# Patient Record
Sex: Female | Born: 1935 | Race: White | Hispanic: No | Marital: Single | Smoking: Never smoker
Health system: Southern US, Community
[De-identification: ages and names within clinical notes are randomized; demographics above are authoritative.]

## PROBLEM LIST (undated history)

## (undated) DIAGNOSIS — R296 Repeated falls: Secondary | ICD-10-CM

## (undated) DIAGNOSIS — M1990 Unspecified osteoarthritis, unspecified site: Secondary | ICD-10-CM

## (undated) DIAGNOSIS — I6529 Occlusion and stenosis of unspecified carotid artery: Secondary | ICD-10-CM

## (undated) DIAGNOSIS — I10 Essential (primary) hypertension: Secondary | ICD-10-CM

## (undated) DIAGNOSIS — E039 Hypothyroidism, unspecified: Secondary | ICD-10-CM

## (undated) DIAGNOSIS — H3530 Unspecified macular degeneration: Secondary | ICD-10-CM

## (undated) DIAGNOSIS — N390 Urinary tract infection, site not specified: Secondary | ICD-10-CM

## (undated) DIAGNOSIS — N179 Acute kidney failure, unspecified: Secondary | ICD-10-CM

## (undated) HISTORY — DX: Unspecified macular degeneration: H35.30

## (undated) HISTORY — DX: Hypothyroidism, unspecified: E03.9

## (undated) HISTORY — DX: Unspecified osteoarthritis, unspecified site: M19.90

## (undated) HISTORY — PX: APPENDECTOMY: SHX54

## (undated) HISTORY — PX: CATARACT EXTRACTION: SUR2

## (undated) HISTORY — PX: LUMBAR LAMINECTOMY: SHX95

## (undated) HISTORY — DX: Essential (primary) hypertension: I10

## (undated) HISTORY — DX: Occlusion and stenosis of unspecified carotid artery: I65.29

---

## 2002-07-05 ENCOUNTER — Encounter: Admission: RE | Admit: 2002-07-05 | Discharge: 2002-07-05 | Payer: Self-pay | Admitting: Internal Medicine

## 2002-07-05 ENCOUNTER — Encounter: Payer: Self-pay | Admitting: Internal Medicine

## 2007-09-13 ENCOUNTER — Ambulatory Visit: Payer: Self-pay | Admitting: Vascular Surgery

## 2007-09-27 ENCOUNTER — Ambulatory Visit: Payer: Self-pay | Admitting: Surgery

## 2008-04-03 ENCOUNTER — Ambulatory Visit: Payer: Self-pay | Admitting: Surgery

## 2008-04-14 ENCOUNTER — Encounter: Payer: Self-pay | Admitting: Surgery

## 2008-04-14 ENCOUNTER — Inpatient Hospital Stay (HOSPITAL_COMMUNITY): Admission: RE | Admit: 2008-04-14 | Discharge: 2008-04-16 | Payer: Self-pay | Admitting: Surgery

## 2008-04-14 ENCOUNTER — Ambulatory Visit: Payer: Self-pay | Admitting: Surgery

## 2008-04-14 HISTORY — PX: CAROTID ENDARTERECTOMY: SUR193

## 2008-05-01 ENCOUNTER — Ambulatory Visit: Payer: Self-pay | Admitting: Surgery

## 2008-11-06 ENCOUNTER — Ambulatory Visit: Payer: Self-pay | Admitting: Surgery

## 2009-05-21 ENCOUNTER — Ambulatory Visit: Payer: Self-pay | Admitting: Surgery

## 2009-11-26 ENCOUNTER — Ambulatory Visit: Payer: Self-pay | Admitting: Surgery

## 2010-06-11 ENCOUNTER — Other Ambulatory Visit (INDEPENDENT_AMBULATORY_CARE_PROVIDER_SITE_OTHER): Payer: Medicare Other

## 2010-06-11 DIAGNOSIS — I6529 Occlusion and stenosis of unspecified carotid artery: Secondary | ICD-10-CM

## 2010-06-11 DIAGNOSIS — Z48812 Encounter for surgical aftercare following surgery on the circulatory system: Secondary | ICD-10-CM

## 2010-06-11 LAB — TYPE AND SCREEN
ABO/RH(D): O POS
Antibody Screen: NEGATIVE

## 2010-06-11 LAB — COMPREHENSIVE METABOLIC PANEL
ALT: <8 U/L (ref 0–35)
AST: 16 U/L (ref 0–37)
CO2: 31 mEq/L (ref 19–32)
Chloride: 100 mEq/L (ref 96–112)
Creatinine, Ser: 1.01 mg/dL (ref 0.4–1.2)
GFR calc Af Amer: >60 mL/min (ref >60)
GFR calc non Af Amer: 53 mL/min — ABNORMAL LOW (ref >60)
Sodium: 141 mEq/L (ref 135–145)
Total Bilirubin: 0.6 mg/dL (ref 0.3–1.2)

## 2010-06-11 LAB — BASIC METABOLIC PANEL
BUN: 12 mg/dL (ref 6–23)
CO2: 28 mEq/L (ref 19–32)
Calcium: 8.8 mg/dL (ref 8.4–10.5)
Chloride: 108 mEq/L (ref 96–112)
Creatinine, Ser: 0.77 mg/dL (ref 0.4–1.2)
GFR calc Af Amer: >60 mL/min (ref >60)
Glucose, Bld: 94 mg/dL (ref 70–99)

## 2010-06-11 LAB — CBC
MCHC: 34.6 g/dL (ref 30.0–36.0)
MCV: 92.4 fL (ref 78.0–100.0)
MCV: 92.6 fL (ref 78.0–100.0)
Platelets: 148 10*3/uL — ABNORMAL LOW (ref 150–400)
RBC: 4.36 MIL/uL (ref 3.87–5.11)
RBC: 3.47 MIL/uL — ABNORMAL LOW (ref 3.87–5.11)
RDW: 13 % (ref 11.5–15.5)
WBC: 8.6 10*3/uL (ref 4.0–10.5)

## 2010-06-11 LAB — URINALYSIS, ROUTINE W REFLEX MICROSCOPIC
Glucose, UA: NEGATIVE mg/dL
Hgb urine dipstick: NEGATIVE
Protein, ur: NEGATIVE mg/dL
pH: 5.5 (ref 5.0–8.0)

## 2010-06-11 LAB — URINE MICROSCOPIC-ADD ON

## 2010-06-18 NOTE — Procedures (Unsigned)
CAROTID DUPLEX EXAM  INDICATION:  Followup carotid endarterectomy.  HISTORY: Diabetes:  No. Cardiac:  No. Hypertension:  Yes. Smoking:  No. Previous Surgery:  Right carotid endarterectomy on 04/14/2008. CV History:  Asymptomatic. Amaurosis Fugax No, Paresthesias No, Hemiparesis No                                      RIGHT             LEFT Brachial systolic pressure:         150               142 Brachial Doppler waveforms:         WNL               WNL Vertebral direction of flow:        Antegrade         Antegrade DUPLEX VELOCITIES (cm/sec) CCA peak systolic                   87                69 ECA peak systolic                   104               104 ICA peak systolic                   128               229 ICA end diastolic                   27                55 PLAQUE MORPHOLOGY:                  Heterogeneous     Calcified PLAQUE AMOUNT:                      Mild              Moderate to severe PLAQUE LOCATION:                    CCA               CCA, ICA  IMPRESSION: 1. Right internal carotid artery appears widely patent with history of     endarterectomy, elevated velocities present which may be consistent     with recently endarterectomized vessel. 2. Left internal carotid artery stenosis in the 40% to 59% range (high     end of range), however, may be underestimated due to calcified     plaque making Doppler interrogation difficult. 3. The left-sided disease is downgraded since previous study on     11/26/2009. 4. The right side is unchanged since study dated 11/26/2009.  ___________________________________________ V. Charlena Cross, MD  SH/MEDQ  D:  06/11/2010  T:  06/11/2010  Job:  [id number]

## 2010-07-09 NOTE — Op Note (Signed)
[name], [name]                  ACCOUNT NO.:  [id number]   MEDICAL RECORD NO.:  [id number]          PATIENT TYPE:  INP   LOCATION:  3305                         FACILITY:  [facility]   PHYSICIAN:  Juleen China IV, MDDATE OF BIRTH:  11-03-1924   DATE OF PROCEDURE:  04/14/2008  DATE OF DISCHARGE:                               OPERATIVE REPORT   PREOPERATIVE DIAGNOSIS:  Asymptomatic right carotid stenosis.   POSTOPERATIVE DIAGNOSIS:  Asymptomatic right carotid stenosis.   PROCEDURE PERFORMED:  Right carotid endarterectomy with bovine  pericardial patch angioplasty.   SURGEON:  1. Charlena Cross, MD   ASSISTANT:  Wilmon Arms, PA   TYPE OF ANESTHESIA:  General.   BLOOD LOSS:  50 mL.   FINDINGS:  Stenosis 90%.  No thrombus.   DRAINS:  None.   CULTURES:  None.   SPECIMENS:  Plaque and a lymph node.   INDICATIONS:  This is an 77 year old female, found to have progression  of her bilateral carotid stenosis by ultrasound.  The right side now  measures 80-99% with the left being 60-79%.  The patient is highly  functional, and after a prolonged discussion regarding treatment  options, we have elected to proceed with carotid endarterectomy.  Risks  and benefits were discussed.  Informed consent was signed.   PROCEDURE:  The patient was identified in the holding area and taken to  room #6.  She was placed supine on the table.  General endotracheal  anesthesia was administered.  The patient was prepped and draped in the  standard sterile fashion.  A time-out was called, and antibiotics were  given.  Incision was made along the anterior border of the  sternocleidomastoid.  Cautery was used to divide the platysma muscle.  Weitlaner retractors were used for exposure.  The internal jugular vein  was skeletonized along its anteromedial border.  The common facial vein  was identified and divided between 2-0 silk ties and metal clips.  The  carotid sheath was then opened, and  the common carotid artery was  exposed throughout its length and the incision, and it was encircled  with an umbilical tape.  Next, I exposed the external carotid artery,  and this was also encircled with a vessel loop.  Dissection was then  carried up onto the internal carotid artery.  The hypoglossal nerve was  visualized and protected.  Once adequate mobilization of the internal  carotid artery was achieved, an umbilical tape was passed.  At this  point in time, the patient was given systemic heparinization.  After the  heparin had circulated, the internal carotid artery was occluded  followed by the external and common carotid arteries.  An #11 blade was  used to make an arteriotomy, which was extended along the anterolateral  border of the artery.  The patient had extensive calcified plaque about  90% stenosis.  There was no thrombus.  Once the artery was opened onto  the normal part of the internal carotid artery, a 10-French shunt was  placed.  Next, endarterectomy was performed using an elevator.  Eversion  endarterectomy was performed in the external carotid artery.  A good  distal end point was obtained on the internal carotid artery.  Two 7-0  tacking sutures were placed.  Next, the endarterectomy site was  irrigated with heparinized saline.  All potential embolic debris was  removed.  Bovine pericardial patch was selected and brought onto the  field.  Patch angioplasty was performed using a running 6-0 Prolene.  Prior to completion of anastomosis, the shunt was removed.  The  internal, external, and common carotid arteries were all flushed  appropriately.  The anastomosis was then secured.  Clamps were released  first on the external carotid artery followed by the common carotid  artery, and after approximately 15 seconds, the clamp on the internal  carotid artery was released.  Several repair stitches were required for  hemostasis.  Next, the artery was evaluated with Doppler.   Good signals  were obtained of the common internal and external carotid arteries.  Next, the patient's heparin was reversed with 50 mg of protamine.  The  carotid sheath was reapproximated with running 3-0 Vicryl.  The platysma  muscle was closed with running 3-0 Vicryl, and skin was closed with 4-0  Vicryl.  The patient tolerated the procedure well.  There were no  complications.  She was taken to the recovery room in stable condition.  She was moving all 4 extremities to command.           ______________________________  V. Charlena Cross, MD  Electronically Signed     VWB/MEDQ  D:  04/14/2008  T:  04/14/2008  Job:  [id number]

## 2010-07-09 NOTE — Procedures (Signed)
CAROTID DUPLEX EXAM   INDICATION:  Follow-up evaluation of known carotid artery disease.   HISTORY:  Diabetes:  No.  Cardiac:  No.  Hypertension:  Yes.  Smoking:  No.  Previous Surgery:  No.  CV History:  Previous duplex on 09/13/2007 revealed 60-79% ICA stenosis  bilaterally.  Amaurosis Fugax  No, Paresthesias No, Hemiparesis No.                                       RIGHT             LEFT  Brachial systolic pressure:         138               136  Brachial Doppler waveforms:         Triphasic         Triphasic  Vertebral direction of flow:        Antegrade         Antegrade  DUPLEX VELOCITIES (cm/sec)  CCA peak systolic                   61                77  ECA peak systolic                   117               120  ICA peak systolic                   471               361  ICA end diastolic                   149               87  PLAQUE MORPHOLOGY:                  Calcified         Calcified  PLAQUE AMOUNT:                      Severe            Severe  PLAQUE LOCATION:                    Proximal ICA      Proximal ICA   IMPRESSION:  1. 80-99% right internal carotid artery stenosis.  2. 60-79% left internal carotid artery stenosis.      ___________________________________________  V. Charlena Cross, MD   MC/MEDQ  D:  04/03/2008  T:  04/03/2008  Job:  [id number]

## 2010-07-09 NOTE — Assessment & Plan Note (Signed)
OFFICE VISIT   [name], [name]  DOB:  Aug 02, 1924                                       09/27/2007  SNKNL#:[id number]   REASON FOR VISIT:  Evaluate carotid disease.   HISTORY:  This is an 77 year old female who began having left-sided neck  pain, which prompted an evaluation of her carotid arteries, and she was  found to have a bruit.  She was sent to our office for further  management as well as a duplex ultrasound.  The patient had a  significant family history for heart disease, as well as stroke.  The  patient, on questioning, denies having any specific symptoms related to  her carotid disease.  She does not have amaurosis fugax.  She does not  have any numbness or weakness in either extremity.  She has never been  slurring her words.  She does not have headaches.  She has had one  episode of dizziness, however.   REVIEW OF SYSTEMS:  GENERAL:  Negative for fevers or chills, weight  loss, weight gain.  CARDIAC:  Negative.  PULMONARY:  Negative.  GASTROINTESTINAL:  Negative.  GENITOURINARY:  Negative.  VASCULAR:  Negative.  NEURO:  Positive for the dizziness.  ORTHO:  Positive for arthritis.  PSYCH:  Negative.  ENT:  Negative.  HEMATOLOGIC:  Negative.   PAST MEDICAL HISTORY:  Hypertension.  Macular degeneration in her left  eye.  Hypothyroidism.   FAMILY HISTORY:  Positive for cardiovascular disease, including stroke  and heart attack.   SOCIAL HISTORY:  She is single.  She does not smoke.  She does not  drink.   PAST SURGICAL HISTORY:  Back surgery, appendectomy, and bilateral  cataract.   MEDICATIONS:  Include Synthroid, hydrochlorothiazide, atenolol.   ALLERGIES:  None.   PHYSICAL EXAMINATION:  Blood pressure 176/69, pulse 64.  General:  She  is well-appearing, in no acute distress.  HEENT:  Normocephalic,  atraumatic.  Pupils are equal.  Sclerae anicteric.  Neck:  Left carotid  bruit.  Cardiovascular is regular rate and rhythm.  No  murmurs, rubs, or  gallops.  Pulmonary:  Lungs are clear bilaterally.  Abdomen:  Soft and  nontender.  No pulsatile mass.  Extremities:  Warm and well perfused.  Neuro:  Cranial nerves 2-12 grossly intact.  Psych:  She is alert and  oriented x3.  Skin is without rash.   DIAGNOSTIC STUDIES:  The patient had a duplex of her carotid arteries,  which revealed 60-79% stenosis bilaterally.   ASSESSMENT AND PLAN:  Asymptomatic bilateral moderate carotid stenosis.   PLAN:  The patient should continue with risk factor modification, which  will include medications for her hypertension.  I would also consider  adding an ACE inhibitor.  The patient does need to be on an aspirin.  With regard to her carotid disease, I would not recommend intervention  at this time, as she is asymptomatic.  I am going to recommend that she  follow up in 6 months with a repeat ultrasound.  She was instructed of  the signs and symptoms of TIA/stroke, and encouraged that, if these  occur, to call the emergency department right away.  Again, I will see  her back in 6 months.   Jorge Ny, MD  Electronically Signed   VWB/MEDQ  D:  09/27/2007  T:  09/28/2007  Job:  [id number]   cc:   Theressa Millard, M.D.

## 2010-07-09 NOTE — Assessment & Plan Note (Signed)
OFFICE VISIT   Tamara, MCGINNESS  DOB:  [birth year]                                       05/01/2008  [id number]#:[id number]   REASON FOR VISIT:  Follow up.   HISTORY:  This is an 77 year old female who is now status post right  carotid endarterectomy for asymptomatic high-grade disease.  Her  operation was performed on February 19th.  She comes back complaining of  mild headaches which are relieved with Tylenol.  She has no neurologic  deficits.  She has some stiffness in her neck.  On physical examination  her blood pressure is 110/71, pulse is 64.  Her incision is well-healed.  Dermabond was removed today.  She is neurologically intact.   ASSESSMENT/PLAN:  Status post right carotid endarterectomy.   Plan:  The patient will follow up in 6 months with a repeat carotid  ultrasound.  Preoperatively, she had 60% to 79% stenosis on the left.  I  told her it is okay for her to take a Tylenol for her headaches;  however, if Tylenol does not relieve her pain she needs to contact me.  Again, I will see her back in 6 months.   Jorge Ny, MD  Electronically Signed   VWB/MEDQ  D:  05/01/2008  T:  05/02/2008  Job:  [id number]   cc:   Theressa Millard, M.D.

## 2010-07-09 NOTE — Procedures (Signed)
CAROTID DUPLEX EXAM   INDICATION:  Carotid artery disease.   HISTORY:  Diabetes:  No.  Cardiac:  No.  Hypertension:  Yes.  Smoking:  No.  Previous Surgery:  Right carotid endarterectomy, 04/14/2008.  CV History:  Asymptomatic.  Amaurosis Fugax Yes No, Paresthesias Yes No, Hemiparesis Yes No                                       RIGHT             LEFT  Brachial systolic pressure:         170               168  Brachial Doppler waveforms:         Within normal limits                Within normal limits  Vertebral direction of flow:        Antegrade         Antegrade  DUPLEX VELOCITIES (cm/sec)  CCA peak systolic                   M=70, D=97        67  ECA peak systolic                   78                107  ICA peak systolic                   132               300  ICA end diastolic                   31                62  PLAQUE MORPHOLOGY:                  Intimal wall thickening             Calcific  PLAQUE AMOUNT:                      Minimal           Large  PLAQUE LOCATION:                    CCA               ICA   IMPRESSION:  1. Right internal carotid artery velocity suggests 1%-39% stenosis,      however, no plaque visualized.  2. Left internal carotid artery velocity suggests a 60%-79% stenosis      with calcific plaque and acoustic shadowing noted in the      bifurcation.   ___________________________________________  V. Charlena Cross, MD   EM/MEDQ  D:  11/26/2009  T:  11/26/2009  Job:  [id number]

## 2010-07-09 NOTE — Discharge Summary (Signed)
[name], Tamara                  ACCOUNT NO.:  [id number]   MEDICAL RECORD NO.:  [id number]          PATIENT TYPE:  INP   LOCATION:  2025                         FACILITY:  [facility]   PHYSICIAN:  Juleen China IV, MDDATE OF BIRTH:  02-Apr-1924   DATE OF ADMISSION:  04/14/2008  DATE OF DISCHARGE:  04/16/2008                               DISCHARGE SUMMARY   FINAL DISCHARGE DIAGNOSES:  1. Asymptomatic right carotid stenosis.  2. Hypothyroidism.  3. Hypertension.  4. Dyslipidemia.   PROCEDURE PERFORMED:  Right carotid endarterectomy, April 14, 2008.   COMPLICATIONS:  None.   CONDITION ON DISCHARGE:  Stable, improving.   DISCHARGE MEDICATIONS:  She is instructed to resume her:  1. Lisinopril 10 mg p.o. daily.  2. Simvastatin 20 mg p.o. daily.  3. Synthroid 75 mcg p.o. daily.  4. Hydrochlorothiazide 25 mg p.o. daily.  5. Atenolol 25 mg p.o. daily.  6. Caltrate Plus p.o. daily.  7. Ocuvite daily.  8. Aspirin 81 mg p.o. daily.  9. She is given a prescription for Percocet 5/325 one p.o. q.4 h.      p.r.n. pain, total #20 were given.   DISPOSITION:  She is being discharged home in stable condition after  receiving careful instructions regarding the care of her wounds and  activity level.  She will be given an appointment with Dr. Myra Gianotti in 2  weeks for continuing in followup.  The office will arrange a visit.  It  is also recommended that she follow up with personal GI Medicine  physician for her esophageal stricture.   BRIEF IDENTIFYING STATEMENT:  For complete details, please refer the  typed history and physical.  Briefly, this very pleasant 77 year old  woman was referred to Dr. Myra Gianotti for asymptomatic carotid occlusive  disease.  He found her to be a suitable candidate for carotid  endarterectomy for stroke prevention.  She was informed of the risks and  benefits of the procedure and after careful consideration elected to  proceed with surgery.   HOSPITAL COURSE:   Preoperative workup was completed as an outpatient.  She was brought in through Same-Day Surgery and underwent the  aforementioned carotid endarterectomy.  For complete details, please  refer the typed operative report.  Procedure was without complications.  She was returned to the Postanesthesia Care Unit extubated.  Following  stabilization, she was transferred to a bed in a Surgical Step-Down  Unit.  She was observed overnight.  The following morning, she was  neurologically nonfocal.  Her tongue was midline and her smile was  symmetrical.  She did have some dysphagia and nausea and vomiting.  Due  to this, we elected to transfer her to a bed on a Surgical Convalescent  Floor.  She was observed for another 24 hours.  Her nausea and vomiting  resolved.  She was found to be stable and was discharged home in stable  condition.      Wilmon Arms, PA      V. Charlena Cross, MD  Electronically Signed    KEL/MEDQ  D:  04/16/2008  T:  04/16/2008  Job:  [id number]

## 2010-07-09 NOTE — Procedures (Signed)
CAROTID DUPLEX EXAM   INDICATION:  Left carotid bruit.   HISTORY:  Diabetes:  No.  Cardiac:  No.  Hypertension:  Yes.  Smoking:  No.  Previous Surgery:  No.  CV History:  Patient complains of episodes of bilateral neck pain and a  dizzy spell last week.  Patient was sent over with the left carotid  bruit.  Amaurosis Fugax No, Paresthesias No, Hemiparesis No.                                       RIGHT             LEFT  Brachial systolic pressure:         178               178  Brachial Doppler waveforms:         Triphasic         Triphasic  Vertebral direction of flow:        Antegrade         Antegrade  DUPLEX VELOCITIES (cm/sec)  CCA peak systolic                   48                60  ECA peak systolic                   65                83  ICA peak systolic                   402               338  ICA end diastolic                   87                69  PLAQUE MORPHOLOGY:                  Mixed             Calcified  PLAQUE AMOUNT:                      Moderate-to-severe                  Moderate-to-severe  PLAQUE LOCATION:                    Proximal ICA      Proximal ICA   IMPRESSION:  1. 60-79% right internal carotid artery stenosis (high end of range).  2. 60-79% left internal carotid artery stenosis (high end of range);      however, acoustic shadowing may mask higher velocities.   A preliminary report was called to Dr. Lovett Calender office.   ___________________________________________  Janetta Hora. Fields, MD   MC/MEDQ  D:  09/13/2007  T:  09/13/2007  Job:  [id number]

## 2010-07-09 NOTE — Procedures (Signed)
CAROTID DUPLEX EXAM   INDICATION:  Follow up known carotid artery disease with right carotid  endarterectomy.   HISTORY:  Diabetes:  No.  Cardiac:  No.  Hypertension:  Yes.  Smoking:  No.  Previous Surgery:  Right carotid endarterectomy on 04/14/2008.  CV History:  No.  Amaurosis Fugax No, Paresthesias No, Hemiparesis No                                       RIGHT             LEFT  Brachial systolic pressure:         150               158  Brachial Doppler waveforms:         biphasic          biphasic  Vertebral direction of flow:        antegrade         antegrade  DUPLEX VELOCITIES (cm/sec)  CCA peak systolic                   75                69  ECA peak systolic                   114               112  ICA peak systolic                   133 (mid)         335  ICA end diastolic                   33                75  PLAQUE MORPHOLOGY:                  none              calcified  PLAQUE AMOUNT:                      none              moderate to severe  PLAQUE LOCATION:                    none              ICA and ECA    IMPRESSION:  1. A 60-79% stenosis noted in the left internal carotid artery.  2. A 40-59% stenosis noted in the right mid-internal carotid artery.  3. Antegrade bilateral vertebral arteries.   ___________________________________________  V. Charlena Cross, MD   MG/MEDQ  D:  11/06/2008  T:  11/07/2008  Job:  [id number]

## 2010-07-09 NOTE — Assessment & Plan Note (Signed)
OFFICE VISIT   ALEXEI, EY  DOB:  1925-02-04                                       04/03/2008  UEAVW#:[id number]   REASON FOR VISIT:  Follow up carotid disease.   HISTORY:  This is an 77 year old female who I initially saw in August  2009 for evaluation of her carotids.  She was found to have a bruit.  On  duplex at that time, she had bilateral 60% to 79% disease.  She was  asymptomatic.  She comes back in today for further follow-up.  She  continues to be without symptoms.  Specifically, she denies numbness or  weakness in either extremity.  She denies amaurosis fugax, no slurring  of her speech.  She has not had any episodes of chest pain or shortness  of breath.  She has been afebrile, no changes in her weight.  She does  complain of some arthritic pain in her left hip and left knee.   PAST MEDICAL HISTORY:  Significant hypertension, macular degeneration in  her left eye, hypothyroidism, esophageal stricture.   FAMILY HISTORY:  Positive for cardiovascular disease, including stroke  and heart attack in her mother's side of the family.   SOCIAL HISTORY:  She is single.  No smoking, no alcohol.   PAST SURGICAL HISTORY:  Back surgery, appendectomy, bilateral cataract  surgery.   MEDICATIONS:  Synthroid, hydrochlorothiazide, atenolol.   ALLERGIES:  None.   PHYSICAL EXAMINATION:  Blood pressure is 106/65, pulse 55.  General:  She is well-appearing, in no distress.  HEENT:  Normocephalic,  atraumatic.  Pupils equal.  Sclerae anicteric.  Neck:  Supple, no JVD,  there is a left carotid bruit.  Cardiovascular:  Regular rate and  rhythm.  No murmurs, rubs or gallops.  Pulmonary:  Lungs are clear  bilaterally.  Abdomen:  Soft, nontender.  No pulsatile mass.  Extremities:  Warm and well-perfused.  Neuro:  Cranial nerves II-XII are  grossly intact.  She is nonfocal.  She has no neurologic deficits.  Psych:  She is alert and x3.  Skin is without rash.   DIAGNOSTIC STUDIES:  Duplex ultrasound was repeated today.  This reveals  progression of her disease.  She now has 80% to 99% stenosis in the  right carotid artery.  Her bifurcation is found to be mid hyoid.  There  was normal internal carotid artery past the stenosis.  Her left carotid  remains 60% to 79% stenosis.   IMPRESSION:  Progressive right carotid stenosis.   PLAN:  I have had a long conversation with the patient regarding our  treatment options.  Despite her being 77 years old, she looks very  healthy and looks to have a long life expectancy.  With 80% to 99% right  stenosis and 60% to 79% left carotid stenosis, I feel it is reasonable  to proceed with carotid endarterectomy.  We discussed the pros and cons  of surgery versus medical management.  We have decided to proceed with  endarterectomy.  We talked about the risks and benefits of surgery  including the risk of nerve injury, the risk of stroke, the risk of  bleeding, the risk of infection.  The patient currently is not taking an  aspirin because she was told not to by her GI doctor who dilates her  esophagus periodically for a stricture.  I  would at least like her to be  temporarily on aspirin around the time of her operation.  She may not  need to be on an aspirin long-term; however, given her cardiovascular  disease I would recommend that she would be.  She is going to discuss  this with Dr. Earl Gala, but will most likely proceed with right carotid  endarterectomy on Friday April 14, 2008.   Jorge Ny, MD  Electronically Signed   VWB/MEDQ  D:  04/03/2008  T:  04/04/2008  Job:  [id number]   cc:   Theressa Millard, M.D.

## 2010-07-09 NOTE — Procedures (Signed)
CAROTID DUPLEX EXAM   INDICATION:  Follow up carotid artery disease.   HISTORY:  Diabetes:  No.  Cardiac:  No.  Hypertension:  Yes.  Smoking:  No.  Previous Surgery:  Right CEA, 04/14/2008.  CV History:  Asymptomatic.  Amaurosis Fugax No, Paresthesias No, Hemiparesis No.                                       RIGHT             LEFT  Brachial systolic pressure:         176               168  Brachial Doppler waveforms:         WNL               WNL  Vertebral direction of flow:        Antegrade         Antegrade  DUPLEX VELOCITIES (cm/sec)  CCA peak systolic                   76                82  ECA peak systolic                   96                120  ICA peak systolic                   138               291  ICA end diastolic                   37                74  PLAQUE MORPHOLOGY:                                    Calcific  PLAQUE AMOUNT:                      None              Moderate-to-severe  PLAQUE LOCATION:  ICA/ECA/bifurcation   IMPRESSION:  1. Right internal carotid artery mid velocities are suggestive of 40-      59% stenosis; however, no plaque visualized.  2. Left internal carotid artery shows evidence of 60-79% stenosis with      calcific plaque and acoustic shadowing noted in the proximal      internal carotid artery.  3. No significant changes from previous study.   ___________________________________________  V. Charlena Cross, MD   AS/MEDQ  D:  05/21/2009  T:  05/21/2009  Job:  [id number]

## 2010-12-04 ENCOUNTER — Other Ambulatory Visit: Payer: Self-pay | Admitting: Lab

## 2010-12-04 DIAGNOSIS — Z48812 Encounter for surgical aftercare following surgery on the circulatory system: Secondary | ICD-10-CM

## 2010-12-04 DIAGNOSIS — I6529 Occlusion and stenosis of unspecified carotid artery: Secondary | ICD-10-CM

## 2010-12-09 ENCOUNTER — Ambulatory Visit (INDEPENDENT_AMBULATORY_CARE_PROVIDER_SITE_OTHER): Payer: Medicare Other | Admitting: Vascular Surgery

## 2010-12-09 DIAGNOSIS — I6529 Occlusion and stenosis of unspecified carotid artery: Secondary | ICD-10-CM

## 2010-12-09 DIAGNOSIS — Z48812 Encounter for surgical aftercare following surgery on the circulatory system: Secondary | ICD-10-CM

## 2011-01-06 ENCOUNTER — Encounter: Payer: Self-pay | Admitting: Surgery

## 2011-01-06 NOTE — Procedures (Unsigned)
CAROTID DUPLEX EXAM  INDICATION:  Follow up carotid stenosis.  HISTORY: Diabetes:  No. Cardiac:  No. Hypertension:  Yes. Smoking:  No. Previous Surgery:  Right carotid endarterectomy on 04/14/2008. CV History:  Asymptomatic. Amaurosis Fugax No, Paresthesias No, hemiparesis No.                                      RIGHT             LEFT Brachial systolic pressure:         138               140 Brachial Doppler waveforms:         WNL               WNL Vertebral direction of flow:        Antegrade         Antegrade DUPLEX VELOCITIES (cm/sec) CCA peak systolic                   66                68 ECA peak systolic                   87                86 ICA peak systolic                   117               202 ICA end diastolic                   25                44 PLAQUE MORPHOLOGY:                  Calcified         Calcified PLAQUE AMOUNT:                      Mild              Moderate PLAQUE LOCATION:                    ICA               CCA/ICA  IMPRESSION: 1. Right internal carotid artery appears patent with history of     endarterectomy. 2. Left internal carotid artery stenosis in the 40% to 59% range (high     end of range). 3. Left internal carotid artery stenosis may be underestimated due to     acoustic shadowing and dense calcified plaque, making Doppler     interrogation difficult. 4. Bilateral vertebral arteries are patent and antegrade. 5. Essentially unchanged since previous study on 06/11/2010.     ___________________________________________ V. Charlena Cross, MD  SH/MEDQ  D:  12/09/2010  T:  12/09/2010  Job:  [id number]

## 2011-01-08 ENCOUNTER — Other Ambulatory Visit: Payer: Self-pay | Admitting: Vascular Surgery

## 2011-01-08 DIAGNOSIS — I6529 Occlusion and stenosis of unspecified carotid artery: Secondary | ICD-10-CM

## 2011-01-08 DIAGNOSIS — Z48812 Encounter for surgical aftercare following surgery on the circulatory system: Secondary | ICD-10-CM

## 2011-06-02 ENCOUNTER — Encounter: Payer: Self-pay | Admitting: Surgery

## 2011-06-06 ENCOUNTER — Encounter: Payer: Self-pay | Admitting: Neurosurgery

## 2011-06-09 ENCOUNTER — Ambulatory Visit (INDEPENDENT_AMBULATORY_CARE_PROVIDER_SITE_OTHER): Payer: Medicare Other | Admitting: Neurosurgery

## 2011-06-09 ENCOUNTER — Other Ambulatory Visit (INDEPENDENT_AMBULATORY_CARE_PROVIDER_SITE_OTHER): Payer: Medicare Other | Admitting: *Deleted

## 2011-06-09 ENCOUNTER — Encounter: Payer: Self-pay | Admitting: Neurosurgery

## 2011-06-09 VITALS — BP 139/66 | HR 65 | Resp 16 | Ht 62.0 in | Wt 145.9 lb

## 2011-06-09 DIAGNOSIS — Z48812 Encounter for surgical aftercare following surgery on the circulatory system: Secondary | ICD-10-CM

## 2011-06-09 DIAGNOSIS — I6529 Occlusion and stenosis of unspecified carotid artery: Secondary | ICD-10-CM | POA: Insufficient documentation

## 2011-06-09 NOTE — Progress Notes (Signed)
[facility] HISTORY AND PHYSICAL   CC: Annual carotid duplex Referring Physician: Brabham  History of Present Illness: 78 year-old female with the history of right CEA in February 2010. She has no signs or symptoms of CVA, TIA, amaurosis fugax, diplopia, dysphasia, or word finding difficulty. Patient reports having a history of MI as well as CVA on her mother side of the family, father's side is unknown. She has no new medical complaints, conditions, no new surgeries.  Past Medical History  Diagnosis Date  . Arthritis   . Hypertension   . Macular degeneration of left eye   . Carotid artery occlusion     Bruit found August 2009  . Thyroid disease     Hypothyroidism    ROS: [x]  Positive   [ ]  Denies    General: [ ]  Weight loss, [ ]  Fever, [ ]  chills Neurologic: [ ]  Dizziness, [ ]  Blackouts, [ ]  Seizure [ ]  Stroke, [ ]  "Mini stroke", [ ]  Slurred speech, [ ]  Temporary blindness; [ ]  weakness in arms or legs, [ ]  Hoarseness Cardiac: [ ]  Chest pain/pressure, [ ]  Shortness of breath at rest [ ]  Shortness of breath with exertion, [ ]  Atrial fibrillation or irregular heartbeat Vascular: [ ]  Pain in legs with walking, [ ]  Pain in legs at rest, [ ]  Pain in legs at night,  [ ]  Non-healing ulcer, [ ]  Blood clot in vein/DVT,   Pulmonary: [ ]  Home oxygen, [ ]  Productive cough, [ ]  Coughing up blood, [ ]  Asthma,  [ ]  Wheezing Musculoskeletal:  [ ]  Arthritis, [ ]  Low back pain, [ ]  Joint pain Hematologic: [ ]  Easy Bruising, [ ]  Anemia; [ ]  Hepatitis Gastrointestinal: [ ]  Blood in stool, [ ]  Gastroesophageal Reflux/heartburn, [ ]  Trouble swallowing Urinary: [ ]  chronic Kidney disease, [ ]  on HD - [ ]  MWF or [ ]  TTHS, [ ]  Burning with urination, [ ]  Difficulty urinating Skin: [ ]  Rashes, [ ]  Wounds Psychological: [ ]  Anxiety, [ ]  Depression   Social History History  Substance Use Topics  . Smoking status: Never Smoker   . Smokeless tobacco: Not on file  . Alcohol  Use: No    Family History Family History  Problem Relation Age of Onset  . Heart disease Mother   . Stroke Mother   . Heart attack Mother     Not on File  Current Outpatient Prescriptions  Medication Sig Dispense Refill  . aspirin 81 MG tablet Take 81 mg by mouth daily.      Marland Kitchen atenolol (TENORMIN) 25 MG tablet Take 25 mg by mouth daily.      . beta carotene w/minerals (OCUVITE) tablet Take 1 tablet by mouth daily.      . Calcium-Vitamin D (CALTRATE 600 PLUS-VIT D PO) Take by mouth.      . hydrochlorothiazide (HYDRODIURIL) 25 MG tablet Take 25 mg by mouth daily.      Marland Kitchen levothyroxine (SYNTHROID, LEVOTHROID) 75 MCG tablet Take 75 mcg by mouth daily.      Marland Kitchen lisinopril (PRINIVIL,ZESTRIL) 10 MG tablet Take 10 mg by mouth daily.      . Nutritional Supplements (EQUATE PO) Take by mouth 2 (two) times daily.      . simvastatin (ZOCOR) 20 MG tablet Take 20 mg by mouth every evening.      Marland Kitchen acetaminophen (TYLENOL) 100 MG/ML solution Take 10 mg/kg by mouth every 4 (four) hours as needed.  Physical Examination  Filed Vitals:   06/09/11 1035  BP: 139/66  Pulse: 65  Resp: 16    Body mass index is 26.69 kg/(m^2).  General:  WDWN in NAD Gait: Normal HEENT: WNL Eyes: Pupils equal Pulmonary: normal non-labored breathing , without Rales, rhonchi,  wheezing Cardiac: RRR, without  Murmurs, rubs or gallops; Abdomen: soft, NT, no masses Skin: no rashes, ulcers noted  Vascular Exam Pulses: 2+ radial pulses bilaterally Carotid bruits: Are not present bilaterally, normal pulse to auscultation bilaterally. Extremities without ischemic changes, no Gangrene , no cellulitis; no open wounds;  Musculoskeletal: no muscle wasting or atrophy   Neurologic: A&O X 3; Appropriate Affect ; SENSATION: normal; MOTOR FUNCTION:  moving all extremities equally. Speech is fluent/normal  Non-Invasive Vascular Imaging CAROTID DUPLEX 06/09/2011  Right ICA 0 - 19% stenosis Left ICA 40 - 59 %  stenosis Previous study from April 2012 shows little change, right end-diastolic was 27 left was 55.  ASSESSMENT/PLAN: Assessment as above, plan patient understands signs and symptoms of CVA, she knows to go to the emergency room or contact our office should these problems arise. Otherwise, she will followup here in one year for repeat carotid duplex and to be seen in my clinic. Her questions were encouraged and answered  Lauree Chandler ANP   Clinic MD: Myra Gianotti

## 2011-06-10 NOTE — Progress Notes (Signed)
Addended by: Sharee Pimple on: 06/10/2011 12:43 PM   Modules accepted: Orders

## 2011-06-17 ENCOUNTER — Other Ambulatory Visit: Payer: Self-pay | Admitting: *Deleted

## 2011-06-18 ENCOUNTER — Encounter: Payer: Self-pay | Admitting: Surgery

## 2011-06-18 NOTE — Procedures (Unsigned)
CAROTID DUPLEX EXAM  INDICATION:  Carotid disease  HISTORY: Diabetes:  No Cardiac:  No Hypertension:  Yes Smoking:  No Previous Surgery:  Right CEA 04/14/2008 CV History:  Currently asymptomatic Amaurosis Fugax No, Paresthesias No, Hemiparesis No                                      RIGHT             LEFT Brachial systolic pressure:         150               155 Brachial Doppler waveforms:         Normal            Normal Vertebral direction of flow:        Antegrade         Antegrade DUPLEX VELOCITIES (cm/sec) CCA peak systolic                   91                62 ECA peak systolic                   57                76 ICA peak systolic                   109               223 ICA end diastolic                   32                57 PLAQUE MORPHOLOGY:                                    Calcific PLAQUE AMOUNT:                      None              Moderate PLAQUE LOCATION:                                      ICA, CCA  IMPRESSION: 1. Patent right carotid endarterectomy site with no evidence of     restenosis. 2. Left internal carotid artery velocity suggests high end 40%-59%     stenosis. 3. Antegrade vertebral arteries bilaterally.  ___________________________________________ V. Charlena Cross, MD  EM/MEDQ  D:  06/10/2011  T:  06/10/2011  Job:  [id number]

## 2012-06-07 ENCOUNTER — Ambulatory Visit: Payer: Medicare Other | Admitting: Neurosurgery

## 2012-06-07 ENCOUNTER — Other Ambulatory Visit (INDEPENDENT_AMBULATORY_CARE_PROVIDER_SITE_OTHER): Payer: Medicare Other | Admitting: *Deleted

## 2012-06-07 DIAGNOSIS — I6529 Occlusion and stenosis of unspecified carotid artery: Secondary | ICD-10-CM

## 2012-06-07 DIAGNOSIS — Z48812 Encounter for surgical aftercare following surgery on the circulatory system: Secondary | ICD-10-CM

## 2012-06-15 ENCOUNTER — Other Ambulatory Visit: Payer: Self-pay | Admitting: *Deleted

## 2012-06-15 DIAGNOSIS — Z48812 Encounter for surgical aftercare following surgery on the circulatory system: Secondary | ICD-10-CM

## 2012-06-16 ENCOUNTER — Encounter: Payer: Self-pay | Admitting: Surgery

## 2013-03-31 ENCOUNTER — Other Ambulatory Visit: Payer: Self-pay | Admitting: Surgery

## 2013-03-31 DIAGNOSIS — Z48812 Encounter for surgical aftercare following surgery on the circulatory system: Secondary | ICD-10-CM

## 2013-03-31 DIAGNOSIS — I6529 Occlusion and stenosis of unspecified carotid artery: Secondary | ICD-10-CM

## 2013-06-10 ENCOUNTER — Encounter: Payer: Self-pay | Admitting: Surgery

## 2013-06-13 ENCOUNTER — Ambulatory Visit (INDEPENDENT_AMBULATORY_CARE_PROVIDER_SITE_OTHER): Payer: Medicare Other | Admitting: Surgery

## 2013-06-13 ENCOUNTER — Encounter: Payer: Self-pay | Admitting: Surgery

## 2013-06-13 ENCOUNTER — Ambulatory Visit (HOSPITAL_COMMUNITY)
Admission: RE | Admit: 2013-06-13 | Discharge: 2013-06-13 | Disposition: A | Discharge status: Home / Self Care | Payer: Medicare Other | Source: Ambulatory Visit | Attending: Surgery | Admitting: Surgery

## 2013-06-13 VITALS — BP 151/77 | HR 63 | Ht 62.0 in | Wt 143.2 lb

## 2013-06-13 DIAGNOSIS — I6529 Occlusion and stenosis of unspecified carotid artery: Secondary | ICD-10-CM

## 2013-06-13 DIAGNOSIS — Z48812 Encounter for surgical aftercare following surgery on the circulatory system: Secondary | ICD-10-CM

## 2013-06-13 NOTE — Addendum Note (Signed)
Addended by: Sharee PimpleMCCHESNEY, MARILYN K on: 06/13/2013 06:35 PM   Modules accepted: Orders

## 2013-06-13 NOTE — Progress Notes (Signed)
Patient name: [name] MRN: [id number] DOB: 01-01-1925 Sex: female     Chief Complaint  Patient presents with  . Re-evaluation    1 year f/u - carotid     HISTORY OF PRESENT ILLNESS: The patient is back today for followup.  She is status post right carotid endarterectomy on 04/14/2008.  This was done for asymptomatic stenosis.  Intraoperative findings included 90% stenosis.  She is here today without complaints other than her arthritis.  She denies any neurologic symptoms.  Specifically she denies numbness or weakness in either extremity.  She denies slurred speech she denies amaurosis fugax.  Past Medical History  Diagnosis Date  . Arthritis   . Hypertension   . Macular degeneration of left eye   . Carotid artery occlusion     Bruit found August 2009  . Thyroid disease     Hypothyroidism    Past Surgical History  Procedure Laterality Date  . Carotid endarterectomy  04/14/2008    Right  . Spine surgery    . Appendectomy    . Eye surgery      Cataract, bilateral    History   Social History  . Marital Status: Single    Spouse Name: N/A    Number of Children: N/A  . Years of Education: N/A   Occupational History  . Not on file.   Social History Main Topics  . Smoking status: Never Smoker   . Smokeless tobacco: Not on file  . Alcohol Use: No  . Drug Use: No  . Sexual Activity:    Other Topics Concern  . Not on file   Social History Narrative  . No narrative on file    Family History  Problem Relation Age of Onset  . Heart disease Mother   . Stroke Mother   . Heart attack Mother     Allergies as of 06/13/2013  . (No Known Allergies)    Current Outpatient Prescriptions on File Prior to Visit  Medication Sig Dispense Refill  . aspirin 81 MG tablet Take 81 mg by mouth daily.      Marland Kitchen. atenolol (TENORMIN) 25 MG tablet Take 25 mg by mouth daily.      . beta carotene w/minerals (OCUVITE) tablet Take 1 tablet by mouth daily.      . Calcium-Vitamin D  (CALTRATE 600 PLUS-VIT D PO) Take by mouth.      . hydrochlorothiazide (HYDRODIURIL) 25 MG tablet Take 25 mg by mouth daily.      Marland Kitchen. levothyroxine (SYNTHROID, LEVOTHROID) 75 MCG tablet Take 75 mcg by mouth daily.      Marland Kitchen. lisinopril (PRINIVIL,ZESTRIL) 10 MG tablet Take 10 mg by mouth daily.      . simvastatin (ZOCOR) 20 MG tablet Take 20 mg by mouth every evening.      . Nutritional Supplements (EQUATE PO) Take by mouth 2 (two) times daily.       No current facility-administered medications on file prior to visit.     REVIEW OF SYSTEMS: Cardiovascular: No chest pain, chest pressure, palpitations, orthopnea, or dyspnea on exertion. No claudication or rest pain,  No history of DVT or phlebitis. Pulmonary: No productive cough, asthma or wheezing. Neurologic: No weakness, paresthesias, aphasia, or amaurosis. No dizziness. Hematologic: No bleeding problems or clotting disorders. Musculoskeletal: Positive for arthritis Gastrointestinal: No blood in stool or hematemesis Genitourinary: No dysuria or hematuria. Psychiatric:: No history of major depression. Integumentary: No rashes or ulcers. Constitutional: No fever or chills.  PHYSICAL EXAMINATION:   Vital signs are BP 151/77  Pulse 63  Ht 5\' 2"  (1.575 m)  Wt 143 lb 3.2 oz (64.955 kg)  BMI 26.18 kg/m2  SpO2 100% General: The patient appears their stated age. HEENT:  No gross abnormalities Pulmonary:  Non labored breathing Musculoskeletal: There are no major deformities. Neurologic: No focal weakness or paresthesias are detected, Skin: There are no ulcer or rashes noted. Psychiatric: The patient has normal affect. Cardiovascular: There is a regular rate and rhythm with systolic ejection murmur.  Right carotid bruit   Diagnostic Studies Ultrasound was ordered and reviewed.  There are no significant findings from one-year prior.  She has 40-59% left internal carotid stenosis.  The right carotid endarterectomy site is widely  patent  Assessment: Status post right carotid endarterectomy for asymptomatic stenosis Plan: The patient's exam today is unremarkable.  Her ultrasound has been stable.  The endarterectomy site is widely patent and there is stable disease on the left and the 40-59% category.  I have recommended repeat ultrasound in one year.  Jorge NyV. Wells Marvelous Woolford IV, M.D. [facility] Office: [phone] Pager:  [phone]

## 2014-06-16 ENCOUNTER — Encounter: Payer: Self-pay | Admitting: Family

## 2014-06-19 ENCOUNTER — Encounter: Payer: Self-pay | Admitting: Family

## 2014-06-19 ENCOUNTER — Ambulatory Visit (INDEPENDENT_AMBULATORY_CARE_PROVIDER_SITE_OTHER): Payer: Medicare Other | Admitting: Family

## 2014-06-19 ENCOUNTER — Ambulatory Visit (HOSPITAL_COMMUNITY)
Admission: RE | Admit: 2014-06-19 | Discharge: 2014-06-19 | Disposition: A | Discharge status: Home / Self Care | Payer: Medicare Other | Source: Ambulatory Visit | Attending: Family | Admitting: Family

## 2014-06-19 VITALS — BP 140/60 | HR 57 | Resp 16 | Ht 61.0 in | Wt 136.0 lb

## 2014-06-19 DIAGNOSIS — Z9889 Other specified postprocedural states: Secondary | ICD-10-CM | POA: Diagnosis not present

## 2014-06-19 DIAGNOSIS — I6523 Occlusion and stenosis of bilateral carotid arteries: Secondary | ICD-10-CM

## 2014-06-19 DIAGNOSIS — Z48812 Encounter for surgical aftercare following surgery on the circulatory system: Secondary | ICD-10-CM

## 2014-06-19 DIAGNOSIS — I6522 Occlusion and stenosis of left carotid artery: Secondary | ICD-10-CM | POA: Diagnosis not present

## 2014-06-19 NOTE — Patient Instructions (Signed)
Stroke Prevention Some medical conditions and behaviors are associated with an increased chance of having a stroke. You may prevent a stroke by making healthy choices and managing medical conditions. HOW CAN I REDUCE MY RISK OF HAVING A STROKE?   Stay physically active. Get at least 30 minutes of activity on most or all days.  Do not smoke. It may also be helpful to avoid exposure to secondhand smoke.  Limit alcohol use. Moderate alcohol use is considered to be:  No more than 2 drinks per day for men.  No more than 1 drink per day for nonpregnant women.  Eat healthy foods. This involves:  Eating 5 or more servings of fruits and vegetables a day.  Making dietary changes that address high blood pressure (hypertension), high cholesterol, diabetes, or obesity.  Manage your cholesterol levels.  Making food choices that are high in fiber and low in saturated fat, trans fat, and cholesterol may control cholesterol levels.  Take any prescribed medicines to control cholesterol as directed by your health care provider.  Manage your diabetes.  Controlling your carbohydrate and sugar intake is recommended to manage diabetes.  Take any prescribed medicines to control diabetes as directed by your health care provider.  Control your hypertension.  Making food choices that are low in salt (sodium), saturated fat, trans fat, and cholesterol is recommended to manage hypertension.  Take any prescribed medicines to control hypertension as directed by your health care provider.  Maintain a healthy weight.  Reducing calorie intake and making food choices that are low in sodium, saturated fat, trans fat, and cholesterol are recommended to manage weight.  Stop drug abuse.  Avoid taking birth control pills.  Talk to your health care provider about the risks of taking birth control pills if you are over 35 years old, smoke, get migraines, or have ever had a blood clot.  Get evaluated for sleep  disorders (sleep apnea).  Talk to your health care provider about getting a sleep evaluation if you snore a lot or have excessive sleepiness.  Take medicines only as directed by your health care provider.  For some people, aspirin or blood thinners (anticoagulants) are helpful in reducing the risk of forming abnormal blood clots that can lead to stroke. If you have the irregular heart rhythm of atrial fibrillation, you should be on a blood thinner unless there is a good reason you cannot take them.  Understand all your medicine instructions.  Make sure that other conditions (such as anemia or atherosclerosis) are addressed. SEEK IMMEDIATE MEDICAL CARE IF:   You have sudden weakness or numbness of the face, arm, or leg, especially on one side of the body.  Your face or eyelid droops to one side.  You have sudden confusion.  You have trouble speaking (aphasia) or understanding.  You have sudden trouble seeing in one or both eyes.  You have sudden trouble walking.  You have dizziness.  You have a loss of balance or coordination.  You have a sudden, severe headache with no known cause.  You have new chest pain or an irregular heartbeat. Any of these symptoms may represent a serious problem that is an emergency. Do not wait to see if the symptoms will go away. Get medical help at once. Call your local emergency services (911 in [place] Do not drive yourself to the hospital. Document Released: 03/20/2004 Document Revised: 06/27/2013 Document Reviewed: 08/13/2012 Exit[employer] Patient Information 2015 [employer]. This information is not intended to replace advice given   to you by your health care provider. Make sure you discuss any questions you have with your health care provider.  

## 2014-06-19 NOTE — Progress Notes (Signed)
Established Carotid Patient   History of Present Illness  [name] is a 81 y.o. female patient of Dr. Myra Gianotti who is back today for followup. She is status post right carotid endarterectomy on 04/14/2008. This was done for asymptomatic stenosis. Intraoperative findings included 90% stenosis. She is here today without complaints other than her arthritis. She denies any neurologic symptoms. Specifically she denies numbness or weakness in either extremity. She denies slurred speech she denies amaurosis fugax  The patient denies any history of TIA or stroke symptoms, specifically the patient denies a history of amaurosis fugax or monocular blindness, denies a history unilateral  of facial drooping, denies a history of hemiplegia, and denies a history of receptive or expressive aphasia.    Pt states strokes and MI's run in her mother's family.  The patient reports New Medical or Surgical History: she fell twice in the last year, denies feeling dizzy, denies tripping, denies loss of consciousness, denies hitting her head  Pt Diabetic: No Pt smoker: non-smoker  Pt meds include: Statin : Yes ASA: Yes Other anticoagulants/antiplatelets: no   Past Medical History  Diagnosis Date  . Arthritis   . Hypertension   . Macular degeneration of left eye   . Carotid artery occlusion     Bruit found August 2009  . Thyroid disease     Hypothyroidism    Social History History  Substance Use Topics  . Smoking status: Never Smoker   . Smokeless tobacco: Not on file  . Alcohol Use: No    Family History Family History  Problem Relation Age of Onset  . Heart disease Mother   . Stroke Mother   . Heart attack Mother     Surgical History Past Surgical History  Procedure Laterality Date  . Carotid endarterectomy  04/14/2008    Right  . Spine surgery    . Appendectomy    . Eye surgery      Cataract, bilateral    No Known Allergies  Current Outpatient Prescriptions  Medication  Sig Dispense Refill  . aspirin 81 MG tablet Take 81 mg by mouth daily.    Marland Kitchen atenolol (TENORMIN) 25 MG tablet Take 25 mg by mouth daily.    . beta carotene w/minerals (OCUVITE) tablet Take 1 tablet by mouth daily.    . Calcium-Vitamin D (CALTRATE 600 PLUS-VIT D PO) Take by mouth.    . hydrochlorothiazide (HYDRODIURIL) 25 MG tablet Take 25 mg by mouth daily.    Marland Kitchen levothyroxine (SYNTHROID, LEVOTHROID) 75 MCG tablet Take 75 mcg by mouth daily.    Marland Kitchen lisinopril (PRINIVIL,ZESTRIL) 10 MG tablet Take 10 mg by mouth daily.    . Nutritional Supplements (EQUATE PO) Take by mouth 2 (two) times daily.    . simvastatin (ZOCOR) 20 MG tablet Take 20 mg by mouth every evening.     No current facility-administered medications for this visit.    Review of Systems : See HPI for pertinent positives and negatives.  Physical Examination  Filed Vitals:   06/19/14 1018 06/19/14 1023 06/19/14 1030 06/19/14 1032  BP: 143/59 151/61 137/59 140/60  Pulse: 61 58 57 57  Resp:  16    Height:   (1.549 m)    Weight:  136 lb (61.689 kg)    SpO2:  100%      General: WDWN female in NAD GAIT: slow and deliberate, using cane Eyes: PERRLA Pulmonary:  Non-labored, CTAB, Negative  Rales, Negative rhonchi, & Negative wheezing.  Cardiac: regular Rhythm,  Positive  murmur.  VASCULAR EXAM Carotid Bruits Right Left   Transmitted cardiac murmur Transmitted cardiac murmur    Aorta is not palpable. Radial pulses are 2+ palpable and equal.                                                                                                                            LE Pulses Right Left       POPLITEAL  not palpable   not palpable       POSTERIOR TIBIAL  1+ palpable   1+ palpable        DORSALIS PEDIS      ANTERIOR TIBIAL not palpable  not palpable     Gastrointestinal: soft, nontender, BS WNL, no r/g,  negative palpated masses.  Musculoskeletal: priateage appro muscle atrophy/wasting. M/S 4/5 throughout,  Extremities without ischemic changes. Arthritic changes in hands.  Neurologic: A&O X 3; Appropriate Affect;  Speech is normal CN 2-12 intact, Pain and light touch intact in extremities, Motor exam as listed above.    Non-Invasive Vascular Imaging CAROTID DUPLEX 06/19/2014   CEREBROVASCULAR DUPLEX EVALUATION    INDICATION: Carotid endarterectomy     PREVIOUS INTERVENTION(S): Right carotid endarterectomy on 04/14/08    DUPLEX EXAM:     RIGHT  LEFT  Peak Systolic Velocities (cm/s) End Diastolic Velocities (cm/s) Plaque LOCATION Peak Systolic Velocities (cm/s) End Diastolic Velocities (cm/s) Plaque  55 9  CCA PROXIMAL 64 9   79 13 HT CCA MID 63 11 HT  80 13  CCA DISTAL 64 12 HT  87 6 HT ECA 90 0 HT  113 23 HT ICA PROXIMAL 219 41 CP  103 26  ICA MID 181 34   102 24  ICA DISTAL 82 15     Not Calculated ICA / CCA Ratio (PSV) 3.4  Antegrade Vertebral Flow Antegrade   Brachial Systolic Pressure (mmHg)   Multiphasic (subclavian artery) Brachial Artery Waveforms Multiphasic (subclavian artery)    Plaque Morphology:  HM = Homogeneous, HT = Heterogeneous, CP = Calcific Plaque, SP = Smooth Plaque, IP = Irregular Plaque  ADDITIONAL FINDINGS: No significant stenosis of the bilateral external or common carotid arteries.    IMPRESSION: 1. Patent right carotid endarterectomy site with no right internal carotid artery stenosis. 2. Doppler velocities suggest a high-end 40-59% stenosis of the left proximal internal carotid artery however these velocities may be underestimated due to calcific plaque shadowing.    Compared to the previous exam:  No significant change noted when compared to the previous exam on 06/13/13.       Assessment: [name] is a 81 y.o. female who presents with asymptomatic patent right carotid endarterectomy site with no right internal carotid artery stenosis and high-end 40-59% stenosis of the left proximal internal carotid artery. No significant change noted when  compared to the previous exam on 06/13/13.   Plan: Follow-up in 1 year with Carotid Duplex.   I discussed in depth with the patient the  nature of atherosclerosis, and emphasized the importance of maximal medical management including strict control of blood pressure, blood glucose, and lipid levels, obtaining regular exercise, and continued cessation of smoking.  The patient is aware that without maximal medical management the underlying atherosclerotic disease process will progress, limiting the benefit of any interventions. The patient was given information about stroke prevention and what symptoms should prompt the patient to seek immediate medical care. Thank you for allowing [place] to participate in this patient's care.  Charisse March, RN, MSN, FNP-C [facility] Office: [phone]  Clinic Physician: Imogene Burn  06/19/2014 10:18 AM

## 2014-06-19 NOTE — Progress Notes (Signed)
Filed Vitals:   06/19/14 1018 06/19/14 1023 06/19/14 1030 06/19/14 1032  BP: 143/59 151/61 137/59 140/60  Pulse: 61 58 57 57  Resp:  16    Height:  5\' 1"  (1.549 m)    Weight:  136 lb (61.689 kg)    SpO2:  100%

## 2014-06-20 NOTE — Addendum Note (Signed)
Addended by: Sharee PimpleMCCHESNEY, Chanay Nugent K on: 06/20/2014 04:34 PM   Modules accepted: Orders

## 2015-06-19 ENCOUNTER — Encounter: Payer: Self-pay | Admitting: Family

## 2015-06-25 ENCOUNTER — Ambulatory Visit (HOSPITAL_COMMUNITY)
Admission: RE | Admit: 2015-06-25 | Discharge: 2015-06-25 | Disposition: A | Discharge status: Home / Self Care | Payer: Medicare Other | Source: Ambulatory Visit | Attending: Family | Admitting: Family

## 2015-06-25 ENCOUNTER — Other Ambulatory Visit: Payer: Self-pay | Admitting: Family

## 2015-06-25 ENCOUNTER — Ambulatory Visit (INDEPENDENT_AMBULATORY_CARE_PROVIDER_SITE_OTHER): Payer: Medicare Other | Admitting: Family

## 2015-06-25 ENCOUNTER — Encounter: Payer: Self-pay | Admitting: Family

## 2015-06-25 VITALS — BP 150/61 | Ht 61.0 in | Wt 136.2 lb

## 2015-06-25 DIAGNOSIS — I10 Essential (primary) hypertension: Secondary | ICD-10-CM | POA: Diagnosis not present

## 2015-06-25 DIAGNOSIS — Z9889 Other specified postprocedural states: Secondary | ICD-10-CM

## 2015-06-25 DIAGNOSIS — Z48812 Encounter for surgical aftercare following surgery on the circulatory system: Secondary | ICD-10-CM | POA: Diagnosis not present

## 2015-06-25 DIAGNOSIS — I6523 Occlusion and stenosis of bilateral carotid arteries: Secondary | ICD-10-CM | POA: Diagnosis not present

## 2015-06-25 DIAGNOSIS — I6522 Occlusion and stenosis of left carotid artery: Secondary | ICD-10-CM

## 2015-06-25 NOTE — Progress Notes (Signed)
Chief Complaint: Follow up Extracranial Carotid Artery Stenosis   History of Present Illness  [name] is a 82 y.o. female  patient of Dr. Myra GianottiBrabham who is back today for followup. She is status post right carotid endarterectomy on 04/14/2008. This was done for asymptomatic stenosis. Intraoperative findings included 90% stenosis. She is here today without complaints other than her arthritis. She denies any neurologic symptoms. Specifically she denies numbness or weakness in either extremity. She denies slurred speech she denies amaurosis fugax  The patient denies any history of TIA or stroke symptoms, specifically the patient denies a history of amaurosis fugax or monocular blindness, denies a history unilateral of facial drooping, denies a history of hemiplegia, and denies a history of receptive or expressive aphasia.   Pt states strokes and MI's run in her mother's family.  The patient reports New Medical or Surgical History: states her arthritis is worse, has had injections in her eye for macular degeneration.  Pt Diabetic: No Pt smoker: non-smoker  Pt meds include: Statin : Yes ASA: Yes Other anticoagulants/antiplatelets: no    Past Medical History  Diagnosis Date  . Arthritis   . Hypertension   . Macular degeneration of left eye   . Carotid artery occlusion     Bruit found August 2009  . Thyroid disease     Hypothyroidism    Social History Social History  Substance Use Topics  . Smoking status: Never Smoker   . Smokeless tobacco: Never Used  . Alcohol Use: No    Family History Family History  Problem Relation Age of Onset  . Heart disease Mother     After age 80  . Stroke Mother   . Heart attack Mother   . Hypertension Mother   . Other Mother     Amputation of Leg- Because of a fall in Nursing Home.    Surgical History Past Surgical History  Procedure Laterality Date  . Carotid endarterectomy  04/14/2008    Right  . Spine surgery    .  Appendectomy    . Eye surgery      Cataract, bilateral    No Known Allergies  Current Outpatient Prescriptions  Medication Sig Dispense Refill  . aspirin 81 MG tablet Take 81 mg by mouth daily.    Marland Kitchen. atenolol (TENORMIN) 25 MG tablet Take 25 mg by mouth daily.    . beta carotene w/minerals (OCUVITE) tablet Take 1 tablet by mouth daily.    . Calcium-Vitamin D (CALTRATE 600 PLUS-VIT D PO) Take by mouth.    . hydrochlorothiazide (HYDRODIURIL) 25 MG tablet Take 25 mg by mouth daily.    Marland Kitchen. levothyroxine (SYNTHROID, LEVOTHROID) 75 MCG tablet Take 75 mcg by mouth daily.    Marland Kitchen. lisinopril (PRINIVIL,ZESTRIL) 10 MG tablet Take 10 mg by mouth daily.    . Nutritional Supplements (EQUATE PO) Take by mouth 2 (two) times daily.    . simvastatin (ZOCOR) 20 MG tablet Take 20 mg by mouth every evening.     No current facility-administered medications for this visit.    Review of Systems : See HPI for pertinent positives and negatives.  Physical Examination  Filed Vitals:   06/25/15 0940 06/25/15 0943  BP: 161/63 150/61  Height: 5\' 1"  (1.549 m)   Weight: 136 lb 3.2 oz (61.78 kg)    Body mass index is 25.75 kg/(m^2).     General: WDWN female in NAD GAIT: slow and deliberate, using cane Eyes: PERRLA Pulmonary: Respirations are non-labored, CTAB, rate  of 16/minute  Cardiac: regular rhythm at 61 rate, + murmur.  VASCULAR EXAM Carotid Bruits Right Left   Transmitted cardiac murmur Transmitted cardiac murmur   Aorta is not palpable. Radial pulses are 2+ palpable and equal.      LE Pulses Right Left   POPLITEAL not palpable  not palpable   POSTERIOR TIBIAL 1+ palpable  1+ palpable    DORSALIS PEDIS  ANTERIOR TIBIAL not palpable  not palpable     Gastrointestinal: soft, nontender, BS WNL, no r/g,no palpated  masses.  Musculoskeletal: priateage appro muscle atrophy/wasting. M/S 4/5 throughout, Extremities without ischemic changes. Arthritic changes in hands.  Neurologic: A&O X 3; Appropriate Affect;  Speech is normal CN 2-12 intact except is hard of hearing, pain and light touch intact in extremities, Motor exam as listed above.            Non-Invasive Vascular Imaging CAROTID DUPLEX 06/25/2015   Right ICA: no restenosis at CEA site. Left ICA: 40 - 59 % stenosis. Bilateral vertebral artery is antegrade. No significant change from 06/19/14 exam  Assessment: [name] is a 82 y.o. female who has no history of stroke or TIA. Today's carotid duplex suggests no restenosis at right CEA site. Left ICA: 40 - 59 % stenosis. Bilateral vertebral artery is antegrade. No significant change from 06/19/14 exam.  Pt states her dentist wants to know if she can be off of ASA for 5 days, I conferred with Dr. Myra Gianotti in regards to her carotid artery stenosis, she may.   Plan: Follow-up in 1 year with Carotid Duplex scan.   I discussed in depth with the patient the nature of atherosclerosis, and emphasized the importance of maximal medical management including strict control of blood pressure, blood glucose, and lipid levels, obtaining regular exercise, and continued cessation of smoking.  The patient is aware that without maximal medical management the underlying atherosclerotic disease process will progress, limiting the benefit of any interventions. The patient was given information about stroke prevention and what symptoms should prompt the patient to seek immediate medical care. Thank you for allowing [place] to participate in this patient's care.  Charisse March, RN, MSN, FNP-C [facility] [place] Office: [phone]  Clinic Physician: Myra Gianotti  06/25/2015 9:45 AM

## 2015-06-25 NOTE — Patient Instructions (Signed)
Stroke Prevention Some medical conditions and behaviors are associated with an increased chance of having a stroke. You may prevent a stroke by making healthy choices and managing medical conditions. HOW CAN I REDUCE MY RISK OF HAVING A STROKE?   Stay physically active. Get at least 30 minutes of activity on most or all days.  Do not smoke. It may also be helpful to avoid exposure to secondhand smoke.  Limit alcohol use. Moderate alcohol use is considered to be:  No more than 2 drinks per day for men.  No more than 1 drink per day for nonpregnant women.  Eat healthy foods. This involves:  Eating 5 or more servings of fruits and vegetables a day.  Making dietary changes that address high blood pressure (hypertension), high cholesterol, diabetes, or obesity.  Manage your cholesterol levels.  Making food choices that are high in fiber and low in saturated fat, trans fat, and cholesterol may control cholesterol levels.  Take any prescribed medicines to control cholesterol as directed by your health care provider.  Manage your diabetes.  Controlling your carbohydrate and sugar intake is recommended to manage diabetes.  Take any prescribed medicines to control diabetes as directed by your health care provider.  Control your hypertension.  Making food choices that are low in salt (sodium), saturated fat, trans fat, and cholesterol is recommended to manage hypertension.  Ask your health care provider if you need treatment to lower your blood pressure. Take any prescribed medicines to control hypertension as directed by your health care provider.  If you are 18-39 years of age, have your blood pressure checked every 3-5 years. If you are 40 years of age or older, have your blood pressure checked every year.  Maintain a healthy weight.  Reducing calorie intake and making food choices that are low in sodium, saturated fat, trans fat, and cholesterol are recommended to manage  weight.  Stop drug abuse.  Avoid taking birth control pills.  Talk to your health care provider about the risks of taking birth control pills if you are over 35 years old, smoke, get migraines, or have ever had a blood clot.  Get evaluated for sleep disorders (sleep apnea).  Talk to your health care provider about getting a sleep evaluation if you snore a lot or have excessive sleepiness.  Take medicines only as directed by your health care provider.  For some people, aspirin or blood thinners (anticoagulants) are helpful in reducing the risk of forming abnormal blood clots that can lead to stroke. If you have the irregular heart rhythm of atrial fibrillation, you should be on a blood thinner unless there is a good reason you cannot take them.  Understand all your medicine instructions.  Make sure that other conditions (such as anemia or atherosclerosis) are addressed. SEEK IMMEDIATE MEDICAL CARE IF:   You have sudden weakness or numbness of the face, arm, or leg, especially on one side of the body.  Your face or eyelid droops to one side.  You have sudden confusion.  You have trouble speaking (aphasia) or understanding.  You have sudden trouble seeing in one or both eyes.  You have sudden trouble walking.  You have dizziness.  You have a loss of balance or coordination.  You have a sudden, severe headache with no known cause.  You have new chest pain or an irregular heartbeat. Any of these symptoms may represent a serious problem that is an emergency. Do not wait to see if the symptoms will   go away. Get medical help at once. Call your local emergency services (911 in [place] Do not drive yourself to the hospital.   This information is not intended to replace advice given to you by your health care provider. Make sure you discuss any questions you have with your health care provider.   Document Released: 03/20/2004 Document Revised: 03/03/2014 Document Reviewed:  08/13/2012 Elsevier Interactive Patient Education 2016 Elsevier Inc.  

## 2015-07-18 DIAGNOSIS — H353232 Exudative age-related macular degeneration, bilateral, with inactive choroidal neovascularization: Secondary | ICD-10-CM | POA: Diagnosis not present

## 2015-07-18 DIAGNOSIS — H43813 Vitreous degeneration, bilateral: Secondary | ICD-10-CM | POA: Diagnosis not present

## 2015-07-31 NOTE — Addendum Note (Signed)
Addended by: Melodye PedMANESS-HARRISON, Niurka Benecke C on: 07/31/2015 12:59 PM   Modules accepted: Orders

## 2015-08-24 DIAGNOSIS — E785 Hyperlipidemia, unspecified: Secondary | ICD-10-CM | POA: Diagnosis not present

## 2015-08-24 DIAGNOSIS — N183 Chronic kidney disease, stage 3 (moderate): Secondary | ICD-10-CM | POA: Diagnosis not present

## 2015-08-24 DIAGNOSIS — I779 Disorder of arteries and arterioles, unspecified: Secondary | ICD-10-CM | POA: Diagnosis not present

## 2015-08-24 DIAGNOSIS — E039 Hypothyroidism, unspecified: Secondary | ICD-10-CM | POA: Diagnosis not present

## 2015-08-24 DIAGNOSIS — I10 Essential (primary) hypertension: Secondary | ICD-10-CM | POA: Diagnosis not present

## 2015-08-29 DIAGNOSIS — I10 Essential (primary) hypertension: Secondary | ICD-10-CM | POA: Diagnosis not present

## 2015-08-29 DIAGNOSIS — E039 Hypothyroidism, unspecified: Secondary | ICD-10-CM | POA: Diagnosis not present

## 2015-08-29 DIAGNOSIS — E781 Pure hyperglyceridemia: Secondary | ICD-10-CM | POA: Diagnosis not present

## 2015-08-29 DIAGNOSIS — E785 Hyperlipidemia, unspecified: Secondary | ICD-10-CM | POA: Diagnosis not present

## 2015-08-29 DIAGNOSIS — N183 Chronic kidney disease, stage 3 (moderate): Secondary | ICD-10-CM | POA: Diagnosis not present

## 2015-08-29 DIAGNOSIS — I779 Disorder of arteries and arterioles, unspecified: Secondary | ICD-10-CM | POA: Diagnosis not present

## 2015-11-15 DIAGNOSIS — H353232 Exudative age-related macular degeneration, bilateral, with inactive choroidal neovascularization: Secondary | ICD-10-CM | POA: Diagnosis not present

## 2015-12-14 DIAGNOSIS — Z23 Encounter for immunization: Secondary | ICD-10-CM | POA: Diagnosis not present

## 2016-03-19 DIAGNOSIS — H353211 Exudative age-related macular degeneration, right eye, with active choroidal neovascularization: Secondary | ICD-10-CM | POA: Diagnosis not present

## 2016-03-19 DIAGNOSIS — H353222 Exudative age-related macular degeneration, left eye, with inactive choroidal neovascularization: Secondary | ICD-10-CM | POA: Diagnosis not present

## 2016-03-19 DIAGNOSIS — H43813 Vitreous degeneration, bilateral: Secondary | ICD-10-CM | POA: Diagnosis not present

## 2016-03-26 DIAGNOSIS — H353211 Exudative age-related macular degeneration, right eye, with active choroidal neovascularization: Secondary | ICD-10-CM | POA: Diagnosis not present

## 2016-04-10 DIAGNOSIS — R296 Repeated falls: Secondary | ICD-10-CM | POA: Diagnosis not present

## 2016-04-10 DIAGNOSIS — S80819A Abrasion, unspecified lower leg, initial encounter: Secondary | ICD-10-CM | POA: Diagnosis not present

## 2016-04-10 DIAGNOSIS — E039 Hypothyroidism, unspecified: Secondary | ICD-10-CM | POA: Diagnosis not present

## 2016-04-10 DIAGNOSIS — R42 Dizziness and giddiness: Secondary | ICD-10-CM | POA: Diagnosis not present

## 2016-04-15 DIAGNOSIS — E785 Hyperlipidemia, unspecified: Secondary | ICD-10-CM | POA: Diagnosis not present

## 2016-04-15 DIAGNOSIS — R296 Repeated falls: Secondary | ICD-10-CM | POA: Diagnosis not present

## 2016-04-15 DIAGNOSIS — I4510 Unspecified right bundle-branch block: Secondary | ICD-10-CM | POA: Diagnosis not present

## 2016-04-15 DIAGNOSIS — N189 Chronic kidney disease, unspecified: Secondary | ICD-10-CM | POA: Diagnosis not present

## 2016-04-15 DIAGNOSIS — E039 Hypothyroidism, unspecified: Secondary | ICD-10-CM | POA: Diagnosis not present

## 2016-04-15 DIAGNOSIS — M170 Bilateral primary osteoarthritis of knee: Secondary | ICD-10-CM | POA: Diagnosis not present

## 2016-04-15 DIAGNOSIS — W08XXXD Fall from other furniture, subsequent encounter: Secondary | ICD-10-CM | POA: Diagnosis not present

## 2016-04-15 DIAGNOSIS — I129 Hypertensive chronic kidney disease with stage 1 through stage 4 chronic kidney disease, or unspecified chronic kidney disease: Secondary | ICD-10-CM | POA: Diagnosis not present

## 2016-04-23 ENCOUNTER — Encounter: Payer: Self-pay | Admitting: Cardiology

## 2016-04-23 DIAGNOSIS — I4510 Unspecified right bundle-branch block: Secondary | ICD-10-CM | POA: Diagnosis not present

## 2016-04-23 DIAGNOSIS — R55 Syncope and collapse: Secondary | ICD-10-CM | POA: Diagnosis not present

## 2016-04-23 DIAGNOSIS — I10 Essential (primary) hypertension: Secondary | ICD-10-CM | POA: Diagnosis not present

## 2016-04-23 DIAGNOSIS — I2510 Atherosclerotic heart disease of native coronary artery without angina pectoris: Secondary | ICD-10-CM | POA: Diagnosis not present

## 2016-04-23 DIAGNOSIS — R42 Dizziness and giddiness: Secondary | ICD-10-CM | POA: Diagnosis not present

## 2016-04-24 DIAGNOSIS — R55 Syncope and collapse: Secondary | ICD-10-CM | POA: Diagnosis not present

## 2016-04-24 DIAGNOSIS — I4510 Unspecified right bundle-branch block: Secondary | ICD-10-CM | POA: Diagnosis not present

## 2016-04-24 DIAGNOSIS — I10 Essential (primary) hypertension: Secondary | ICD-10-CM | POA: Diagnosis not present

## 2016-04-24 DIAGNOSIS — R42 Dizziness and giddiness: Secondary | ICD-10-CM | POA: Diagnosis not present

## 2016-05-16 DIAGNOSIS — R55 Syncope and collapse: Secondary | ICD-10-CM | POA: Diagnosis not present

## 2016-05-21 DIAGNOSIS — H353211 Exudative age-related macular degeneration, right eye, with active choroidal neovascularization: Secondary | ICD-10-CM | POA: Diagnosis not present

## 2016-05-21 DIAGNOSIS — H353222 Exudative age-related macular degeneration, left eye, with inactive choroidal neovascularization: Secondary | ICD-10-CM | POA: Diagnosis not present

## 2016-05-21 DIAGNOSIS — H43813 Vitreous degeneration, bilateral: Secondary | ICD-10-CM | POA: Diagnosis not present

## 2016-05-26 DIAGNOSIS — I10 Essential (primary) hypertension: Secondary | ICD-10-CM | POA: Diagnosis not present

## 2016-05-26 DIAGNOSIS — R42 Dizziness and giddiness: Secondary | ICD-10-CM | POA: Diagnosis not present

## 2016-05-26 DIAGNOSIS — R55 Syncope and collapse: Secondary | ICD-10-CM | POA: Diagnosis not present

## 2016-05-26 DIAGNOSIS — I4510 Unspecified right bundle-branch block: Secondary | ICD-10-CM | POA: Diagnosis not present

## 2016-07-16 ENCOUNTER — Encounter: Payer: Self-pay | Admitting: Family

## 2016-07-18 DIAGNOSIS — H43813 Vitreous degeneration, bilateral: Secondary | ICD-10-CM | POA: Diagnosis not present

## 2016-07-18 DIAGNOSIS — H353222 Exudative age-related macular degeneration, left eye, with inactive choroidal neovascularization: Secondary | ICD-10-CM | POA: Diagnosis not present

## 2016-07-18 DIAGNOSIS — H353211 Exudative age-related macular degeneration, right eye, with active choroidal neovascularization: Secondary | ICD-10-CM | POA: Diagnosis not present

## 2016-07-28 ENCOUNTER — Encounter: Payer: Self-pay | Admitting: Family

## 2016-07-28 ENCOUNTER — Ambulatory Visit (HOSPITAL_COMMUNITY)
Admission: RE | Admit: 2016-07-28 | Discharge: 2016-07-28 | Disposition: A | Discharge status: Home / Self Care | Payer: Medicare Other | Source: Ambulatory Visit | Attending: Family | Admitting: Family

## 2016-07-28 ENCOUNTER — Ambulatory Visit (INDEPENDENT_AMBULATORY_CARE_PROVIDER_SITE_OTHER): Payer: Medicare Other | Admitting: Family

## 2016-07-28 VITALS — BP 155/58 | HR 61 | Temp 96.9°F | Resp 18 | Ht 61.0 in | Wt 123.0 lb

## 2016-07-28 DIAGNOSIS — I6523 Occlusion and stenosis of bilateral carotid arteries: Secondary | ICD-10-CM

## 2016-07-28 DIAGNOSIS — Z48812 Encounter for surgical aftercare following surgery on the circulatory system: Secondary | ICD-10-CM | POA: Diagnosis not present

## 2016-07-28 DIAGNOSIS — Z9889 Other specified postprocedural states: Secondary | ICD-10-CM | POA: Diagnosis not present

## 2016-07-28 DIAGNOSIS — I6522 Occlusion and stenosis of left carotid artery: Secondary | ICD-10-CM | POA: Insufficient documentation

## 2016-07-28 LAB — VAS US CAROTID
LCCADDIAS: 9 cm/s
LCCAPDIAS: 11 cm/s
LCCAPSYS: 71 cm/s
LEFT ECA DIAS: 2 cm/s
LEFT VERTEBRAL DIAS: 9 cm/s
LICADSYS: -206 cm/s
Left CCA dist sys: 77 cm/s
Left ICA dist dias: -24 cm/s
RCCAPSYS: 74 cm/s
RIGHT CCA MID DIAS: 11 cm/s
RIGHT ECA DIAS: -7 cm/s
RIGHT VERTEBRAL DIAS: -9 cm/s
Right CCA prox dias: 7 cm/s
Right cca dist sys: -116 cm/s

## 2016-07-28 NOTE — Patient Instructions (Signed)
Stroke Prevention Some medical conditions and behaviors are associated with an increased chance of having a stroke. You may prevent a stroke by making healthy choices and managing medical conditions. How can I reduce my risk of having a stroke?  Stay physically active. Get at least 30 minutes of activity on most or all days.  Do not smoke. It may also be helpful to avoid exposure to secondhand smoke.  Limit alcohol use. Moderate alcohol use is considered to be: ? No more than 2 drinks per day for men. ? No more than 1 drink per day for nonpregnant women.  Eat healthy foods. This involves: ? Eating 5 or more servings of fruits and vegetables a day. ? Making dietary changes that address high blood pressure (hypertension), high cholesterol, diabetes, or obesity.  Manage your cholesterol levels. ? Making food choices that are high in fiber and low in saturated fat, trans fat, and cholesterol may control cholesterol levels. ? Take any prescribed medicines to control cholesterol as directed by your health care provider.  Manage your diabetes. ? Controlling your carbohydrate and sugar intake is recommended to manage diabetes. ? Take any prescribed medicines to control diabetes as directed by your health care provider.  Control your hypertension. ? Making food choices that are low in salt (sodium), saturated fat, trans fat, and cholesterol is recommended to manage hypertension. ? Ask your health care provider if you need treatment to lower your blood pressure. Take any prescribed medicines to control hypertension as directed by your health care provider. ? If you are 18-39 years of age, have your blood pressure checked every 3-5 years. If you are 40 years of age or older, have your blood pressure checked every year.  Maintain a healthy weight. ? Reducing calorie intake and making food choices that are low in sodium, saturated fat, trans fat, and cholesterol are recommended to manage  weight.  Stop drug abuse.  Avoid taking birth control pills. ? Talk to your health care provider about the risks of taking birth control pills if you are over 35 years old, smoke, get migraines, or have ever had a blood clot.  Get evaluated for sleep disorders (sleep apnea). ? Talk to your health care provider about getting a sleep evaluation if you snore a lot or have excessive sleepiness.  Take medicines only as directed by your health care provider. ? For some people, aspirin or blood thinners (anticoagulants) are helpful in reducing the risk of forming abnormal blood clots that can lead to stroke. If you have the irregular heart rhythm of atrial fibrillation, you should be on a blood thinner unless there is a good reason you cannot take them. ? Understand all your medicine instructions.  Make sure that other conditions (such as anemia or atherosclerosis) are addressed. Get help right away if:  You have sudden weakness or numbness of the face, arm, or leg, especially on one side of the body.  Your face or eyelid droops to one side.  You have sudden confusion.  You have trouble speaking (aphasia) or understanding.  You have sudden trouble seeing in one or both eyes.  You have sudden trouble walking.  You have dizziness.  You have a loss of balance or coordination.  You have a sudden, severe headache with no known cause.  You have new chest pain or an irregular heartbeat. Any of these symptoms may represent a serious problem that is an emergency. Do not wait to see if the symptoms will go away.   Get medical help at once. Call your local emergency services (911 in [place] Do not drive yourself to the hospital. This information is not intended to replace advice given to you by your health care provider. Make sure you discuss any questions you have with your health care provider. Document Released: 03/20/2004 Document Revised: 07/19/2015 Document Reviewed: 08/13/2012 Elsevier  Interactive Patient Education  2017 Elsevier Inc.     Preventing Cerebrovascular Disease Arteries are blood vessels that carry blood that contains oxygen from the heart to all parts of the body. Cerebrovascular disease affects arteries that supply the brain. Any condition that blocks or disrupts blood flow to the brain can cause cerebrovascular disease. Brain cells that lose blood supply start to die within minutes (stroke). Stroke is the main danger of cerebrovascular disease. Atherosclerosis and high blood pressure are common causes of cerebrovascular disease. Atherosclerosis is narrowing and hardening of an artery that results when fat, cholesterol, calcium, or other substances (plaque) build up inside an artery. Plaque reduces blood flow through the artery. High blood pressure increases the risk of bleeding inside the brain. Making diet and lifestyle changes to prevent atherosclerosis and high blood pressure lowers your risk of cerebrovascular disease. What nutrition changes can be made?  Eat more fruits, vegetables, and whole grains.  Reduce how much saturated fat you eat. To do this, eat less red meat and fewer full-fat dairy products.  Eat healthy proteins instead of red meat. Healthy proteins include: ? Fish. Eat fish that contains heart-healthy omega-3 fatty acids, twice a week. Examples include salmon, albacore tuna, mackerel, and herring. ? Chicken. ? Nuts. ? Low-fat or nonfat yogurt.  Avoid processed meats, like bacon and lunchmeat.  Avoid foods that contain: ? A lot of sugar, such as sweets and drinks with added sugar. ? A lot of salt (sodium). Avoid adding extra salt to your food, as told by your health care provider. ? Trans fats, such as margarine and baked goods. Trans fats may be listed as "partially hydrogenated oils" on food labels.  Check food labels to see how much sodium, sugar, and trans fats are in foods.  Use vegetable oils that contain low amounts of  saturated fat, such as olive oil or canola oil. What lifestyle changes can be made?  Drink alcohol in moderation. This means no more than 1 drink a day for nonpregnant women and 2 drinks a day for men. One drink equals 12 oz of beer, 5 oz of wine, or 1 oz of hard liquor.  If you are overweight, ask your health care provider to recommend a weight-loss plan for you. Losing 5-10 lb (2.2-4.5 kg) can reduce your risk of diabetes, atherosclerosis, and high blood pressure.  Exercise for 30?60 minutes on most days, or as much as told by your health care provider. ? Do moderate-intensity exercise, such as brisk walking, bicycling, and water aerobics. Ask your health care provider which activities are safe for you.  Do not use any products that contain nicotine or tobacco, such as cigarettes and e-cigarettes. If you need help quitting, ask your health care provider. Why are these changes important? Making these changes lowers your risk of many diseases that can cause cerebrovascular disease and stroke. Stroke is a leading cause of death and disability. Making these changes also improves your overall health and quality of life. What can I do to lower my risk? The following factors make you more likely to develop cerebrovascular disease:  Being overweight.  Smoking.  Being physically inactive.    Eating a high-fat diet.  Having certain health conditions, such as: ? Diabetes. ? High blood pressure. ? Heart disease. ? Atherosclerosis. ? High cholesterol. ? Sickle cell disease.  Talk with your health care provider about your risk for cerebrovascular disease. Work with your health care provider to control diseases that you have that may contribute to cerebrovascular disease. Your health care provider may prescribe medicines to help prevent major causes of cerebrovascular disease. Where to find more information: Learn more about preventing cerebrovascular disease from:  National Heart, Lung, and  Blood Institute: www.nhlbi.nih.gov/health/health-topics/topics/stroke  Centers for Disease Control and Prevention: cdc.gov/stroke/about.htm  Summary  Cerebrovascular disease can lead to a stroke.  Atherosclerosis and high blood pressure are major causes of cerebrovascular disease.  Making diet and lifestyle changes can reduce your risk of cerebrovascular disease.  Work with your health care provider to get your risk factors under control to reduce your risk of cerebrovascular disease. This information is not intended to replace advice given to you by your health care provider. Make sure you discuss any questions you have with your health care provider. Document Released: 02/25/2015 Document Revised: 08/31/2015 Document Reviewed: 02/25/2015 Elsevier Interactive Patient Education  2018 Elsevier Inc.  

## 2016-07-28 NOTE — Progress Notes (Signed)
Chief Complaint: Follow up Extracranial Carotid Artery Stenosis   History of Present Illness  [name] is a 83 y.o. female patient of Dr. Myra GianottiBrabham who is back today for followup. She is status post right carotid endarterectomy on 04/14/2008. This was done for asymptomatic stenosis. Intraoperative findings included 90% stenosis.  The patient denies any history of TIA or stroke symptoms, specifically she denies a history of amaurosis fugax or monocular blindness, unilateralfacial drooping, hemiplegia, or receptive or expressive aphasia.   Pt states strokes and MI's run in her mother's family.  She states her arthritis is about the same, has trouble using her hands, has had injections in her eye for macular degeneration.  Pt Diabetic: No Pt smoker: non-smoker  Pt meds include: Statin : Yes ASA: Yes Other anticoagulants/antiplatelets: no    Past Medical History:  Diagnosis Date  . Arthritis   . Carotid artery occlusion    Bruit found August 2009  . Hypertension   . Hypothyroidism    Hypothyroidism  . Macular degeneration of left eye     Social History Social History  Substance Use Topics  . Smoking status: Never Smoker  . Smokeless tobacco: Never Used  . Alcohol use No    Family History Family History  Problem Relation Age of Onset  . Heart disease Mother        After age 81  . Stroke Mother   . Heart attack Mother   . Hypertension Mother   . Other Mother        Amputation of Leg- Because of a fall in Nursing Home.    Surgical History Past Surgical History:  Procedure Laterality Date  . APPENDECTOMY    . CAROTID ENDARTERECTOMY  04/14/2008   Right  . CATARACT EXTRACTION    . LUMBAR LAMINECTOMY      No Known Allergies  Current Outpatient Prescriptions  Medication Sig Dispense Refill  . aspirin 81 MG tablet Take 81 mg by mouth daily.    Marland Kitchen. atenolol (TENORMIN) 25 MG tablet Take 25 mg by mouth daily.    . beta carotene w/minerals (OCUVITE)  tablet Take 1 tablet by mouth daily.    . Calcium-Vitamin D (CALTRATE 600 PLUS-VIT D PO) Take by mouth.    . hydrochlorothiazide (HYDRODIURIL) 25 MG tablet Take 25 mg by mouth daily.    Marland Kitchen. levothyroxine (SYNTHROID, LEVOTHROID) 75 MCG tablet Take 75 mcg by mouth daily.    Marland Kitchen. lisinopril (PRINIVIL,ZESTRIL) 10 MG tablet Take 10 mg by mouth daily.    . Nutritional Supplements (EQUATE PO) Take by mouth 2 (two) times daily.    . simvastatin (ZOCOR) 20 MG tablet Take 20 mg by mouth every evening.     No current facility-administered medications for this visit.     Review of Systems : See HPI for pertinent positives and negatives.  Physical Examination  Vitals:   07/28/16 1334 07/28/16 1337  BP: (!) 148/60 (!) 155/58  Pulse: 61   Resp: 18   Temp: (!) 96.9 F (36.1 C)   TempSrc: Oral   SpO2: 97%   Weight: 123 lb (55.8 kg)   Height: 5\' 1"  (1.549 m)    Body mass index is 23.24 kg/m.  General: WDWN female in NAD GAIT: slow and deliberate, using cane Eyes: PERRLA Pulmonary: Respirations are non-labored, CTAB, rate of 16/minute  Cardiac: regular rhythm at 61 rate, + murmur.  VASCULAR EXAM Carotid Bruits Right Left   Transmitted cardiac murmur Transmitted cardiac murmur   Aorta is  not palpable. Radial pulses are 2+ palpable and equal.      LE Pulses Right Left   POPLITEAL not palpable  not palpable   POSTERIOR TIBIAL 1+ palpable  1+ palpable    DORSALIS PEDIS  ANTERIOR TIBIAL not palpable  1+ palpable     Gastrointestinal: soft, nontender, BS WNL, no r/g,no palpated masses.  Musculoskeletal:  Age appropriate muscle atrophy/wasting. M/S 4/5 throughout, Extremities without ischemic changes. Arthritic changes in hands.  Neurologic: A&O X 3; Appropriate Affect;  Speech is normal CN 2-12 intact except is  hard of hearing, pain and light touch intact in extremities, Motor exam as listed above.    Assessment: [name] is a 83 y.o. female who is status post right carotid endarterectomy on 04/14/2008.  She has no history of stroke or TIA. Fortunately she does not have DM and has never used tobacco.   DATA Today's carotid duplex suggests no restenosis at right CEA site. Left ICA: 40 - 59 % stenosis. Bilateral vertebral artery flow is antegrade.  Bilateral subclavian artery waveforms are normal.  No significant change from 06/19/14 and 06/25/15 exams.   Plan: Follow-up in 1 year with Carotid Duplex scan.   I discussed in depth with the patient the nature of atherosclerosis, and emphasized the importance of maximal medical management including strict control of blood pressure, blood glucose, and lipid levels, obtaining regular exercise, and continued cessation of smoking.  The patient is aware that without maximal medical management the underlying atherosclerotic disease process will progress, limiting the benefit of any interventions. The patient was given information about stroke prevention and what symptoms should prompt the patient to seek immediate medical care. Thank you for allowing [place] to participate in this patient's care.  Charisse March, RN, MSN, FNP-C [facility] Office: [phone]  Clinic Physician: Myra Gianotti  07/28/16 1:39 PM

## 2016-08-14 NOTE — Addendum Note (Signed)
Addended by: Burton ApleyPETTY, Lakesia Dahle A on: 08/14/2016 02:43 PM   Modules accepted: Orders

## 2016-09-24 DIAGNOSIS — H353222 Exudative age-related macular degeneration, left eye, with inactive choroidal neovascularization: Secondary | ICD-10-CM | POA: Diagnosis not present

## 2016-09-24 DIAGNOSIS — H43813 Vitreous degeneration, bilateral: Secondary | ICD-10-CM | POA: Diagnosis not present

## 2016-09-24 DIAGNOSIS — H353211 Exudative age-related macular degeneration, right eye, with active choroidal neovascularization: Secondary | ICD-10-CM | POA: Diagnosis not present

## 2016-10-17 DIAGNOSIS — E785 Hyperlipidemia, unspecified: Secondary | ICD-10-CM | POA: Diagnosis not present

## 2016-10-17 DIAGNOSIS — I10 Essential (primary) hypertension: Secondary | ICD-10-CM | POA: Diagnosis not present

## 2016-10-17 DIAGNOSIS — E039 Hypothyroidism, unspecified: Secondary | ICD-10-CM | POA: Diagnosis not present

## 2016-11-26 DIAGNOSIS — Z23 Encounter for immunization: Secondary | ICD-10-CM | POA: Diagnosis not present

## 2016-12-03 DIAGNOSIS — H353211 Exudative age-related macular degeneration, right eye, with active choroidal neovascularization: Secondary | ICD-10-CM | POA: Diagnosis not present

## 2016-12-03 DIAGNOSIS — H43813 Vitreous degeneration, bilateral: Secondary | ICD-10-CM | POA: Diagnosis not present

## 2016-12-03 DIAGNOSIS — H353222 Exudative age-related macular degeneration, left eye, with inactive choroidal neovascularization: Secondary | ICD-10-CM | POA: Diagnosis not present

## 2016-12-16 DIAGNOSIS — E039 Hypothyroidism, unspecified: Secondary | ICD-10-CM | POA: Diagnosis not present

## 2016-12-16 DIAGNOSIS — R296 Repeated falls: Secondary | ICD-10-CM | POA: Diagnosis not present

## 2016-12-16 DIAGNOSIS — R1310 Dysphagia, unspecified: Secondary | ICD-10-CM | POA: Diagnosis not present

## 2016-12-23 DIAGNOSIS — R1310 Dysphagia, unspecified: Secondary | ICD-10-CM | POA: Diagnosis not present

## 2016-12-23 DIAGNOSIS — M170 Bilateral primary osteoarthritis of knee: Secondary | ICD-10-CM | POA: Diagnosis not present

## 2016-12-23 DIAGNOSIS — R296 Repeated falls: Secondary | ICD-10-CM | POA: Diagnosis not present

## 2016-12-23 DIAGNOSIS — Z7982 Long term (current) use of aspirin: Secondary | ICD-10-CM | POA: Diagnosis not present

## 2016-12-23 DIAGNOSIS — I4510 Unspecified right bundle-branch block: Secondary | ICD-10-CM | POA: Diagnosis not present

## 2016-12-23 DIAGNOSIS — N183 Chronic kidney disease, stage 3 (moderate): Secondary | ICD-10-CM | POA: Diagnosis not present

## 2016-12-23 DIAGNOSIS — I2510 Atherosclerotic heart disease of native coronary artery without angina pectoris: Secondary | ICD-10-CM | POA: Diagnosis not present

## 2016-12-23 DIAGNOSIS — H3530 Unspecified macular degeneration: Secondary | ICD-10-CM | POA: Diagnosis not present

## 2016-12-23 DIAGNOSIS — E039 Hypothyroidism, unspecified: Secondary | ICD-10-CM | POA: Diagnosis not present

## 2016-12-23 DIAGNOSIS — I129 Hypertensive chronic kidney disease with stage 1 through stage 4 chronic kidney disease, or unspecified chronic kidney disease: Secondary | ICD-10-CM | POA: Diagnosis not present

## 2017-01-21 ENCOUNTER — Other Ambulatory Visit: Payer: Self-pay | Admitting: Gastroenterology

## 2017-01-21 DIAGNOSIS — R1319 Other dysphagia: Secondary | ICD-10-CM

## 2017-01-21 DIAGNOSIS — R1310 Dysphagia, unspecified: Secondary | ICD-10-CM | POA: Diagnosis not present

## 2017-01-28 ENCOUNTER — Ambulatory Visit
Admission: RE | Admit: 2017-01-28 | Discharge: 2017-01-28 | Disposition: A | Discharge status: Home / Self Care | Payer: Medicare Other | Source: Ambulatory Visit | Attending: Gastroenterology | Admitting: Gastroenterology

## 2017-01-28 DIAGNOSIS — R1310 Dysphagia, unspecified: Secondary | ICD-10-CM

## 2017-01-28 DIAGNOSIS — R1319 Other dysphagia: Secondary | ICD-10-CM

## 2017-02-19 DIAGNOSIS — K222 Esophageal obstruction: Secondary | ICD-10-CM | POA: Diagnosis not present

## 2017-02-19 DIAGNOSIS — R933 Abnormal findings on diagnostic imaging of other parts of digestive tract: Secondary | ICD-10-CM | POA: Diagnosis not present

## 2017-02-19 DIAGNOSIS — K2930 Chronic superficial gastritis without bleeding: Secondary | ICD-10-CM | POA: Diagnosis not present

## 2017-02-19 DIAGNOSIS — R1310 Dysphagia, unspecified: Secondary | ICD-10-CM | POA: Diagnosis not present

## 2017-02-25 DIAGNOSIS — H353222 Exudative age-related macular degeneration, left eye, with inactive choroidal neovascularization: Secondary | ICD-10-CM | POA: Diagnosis not present

## 2017-02-25 DIAGNOSIS — H43813 Vitreous degeneration, bilateral: Secondary | ICD-10-CM | POA: Diagnosis not present

## 2017-02-25 DIAGNOSIS — H353211 Exudative age-related macular degeneration, right eye, with active choroidal neovascularization: Secondary | ICD-10-CM | POA: Diagnosis not present

## 2017-03-02 DIAGNOSIS — R1310 Dysphagia, unspecified: Secondary | ICD-10-CM | POA: Diagnosis not present

## 2017-03-02 DIAGNOSIS — K2930 Chronic superficial gastritis without bleeding: Secondary | ICD-10-CM | POA: Diagnosis not present

## 2017-03-19 DIAGNOSIS — N183 Chronic kidney disease, stage 3 (moderate): Secondary | ICD-10-CM | POA: Diagnosis not present

## 2017-03-19 DIAGNOSIS — R296 Repeated falls: Secondary | ICD-10-CM | POA: Diagnosis not present

## 2017-03-19 DIAGNOSIS — E785 Hyperlipidemia, unspecified: Secondary | ICD-10-CM | POA: Diagnosis not present

## 2017-03-19 DIAGNOSIS — Z0000 Encounter for general adult medical examination without abnormal findings: Secondary | ICD-10-CM | POA: Diagnosis not present

## 2017-06-03 DIAGNOSIS — H353232 Exudative age-related macular degeneration, bilateral, with inactive choroidal neovascularization: Secondary | ICD-10-CM | POA: Diagnosis not present

## 2017-06-03 DIAGNOSIS — H43813 Vitreous degeneration, bilateral: Secondary | ICD-10-CM | POA: Diagnosis not present

## 2017-07-08 ENCOUNTER — Emergency Department (HOSPITAL_COMMUNITY): Payer: Medicare Other

## 2017-07-08 ENCOUNTER — Emergency Department (HOSPITAL_COMMUNITY)
Admission: EM | Admit: 2017-07-08 | Discharge: 2017-07-08 | Disposition: A | Discharge status: Home / Self Care | Payer: Medicare Other | Attending: Emergency Medicine | Admitting: Emergency Medicine

## 2017-07-08 ENCOUNTER — Encounter (HOSPITAL_COMMUNITY): Payer: Self-pay | Admitting: *Deleted

## 2017-07-08 DIAGNOSIS — Z7982 Long term (current) use of aspirin: Secondary | ICD-10-CM | POA: Diagnosis not present

## 2017-07-08 DIAGNOSIS — E039 Hypothyroidism, unspecified: Secondary | ICD-10-CM | POA: Insufficient documentation

## 2017-07-08 DIAGNOSIS — Y999 Unspecified external cause status: Secondary | ICD-10-CM | POA: Insufficient documentation

## 2017-07-08 DIAGNOSIS — M179 Osteoarthritis of knee, unspecified: Secondary | ICD-10-CM | POA: Insufficient documentation

## 2017-07-08 DIAGNOSIS — S0181XA Laceration without foreign body of other part of head, initial encounter: Secondary | ICD-10-CM | POA: Diagnosis not present

## 2017-07-08 DIAGNOSIS — I10 Essential (primary) hypertension: Secondary | ICD-10-CM | POA: Insufficient documentation

## 2017-07-08 DIAGNOSIS — M1712 Unilateral primary osteoarthritis, left knee: Secondary | ICD-10-CM

## 2017-07-08 DIAGNOSIS — M1990 Unspecified osteoarthritis, unspecified site: Secondary | ICD-10-CM | POA: Diagnosis not present

## 2017-07-08 DIAGNOSIS — Y9301 Activity, walking, marching and hiking: Secondary | ICD-10-CM | POA: Diagnosis not present

## 2017-07-08 DIAGNOSIS — Z79899 Other long term (current) drug therapy: Secondary | ICD-10-CM | POA: Insufficient documentation

## 2017-07-08 DIAGNOSIS — W0110XA Fall on same level from slipping, tripping and stumbling with subsequent striking against unspecified object, initial encounter: Secondary | ICD-10-CM | POA: Diagnosis not present

## 2017-07-08 DIAGNOSIS — M7989 Other specified soft tissue disorders: Secondary | ICD-10-CM | POA: Diagnosis not present

## 2017-07-08 DIAGNOSIS — S8992XA Unspecified injury of left lower leg, initial encounter: Secondary | ICD-10-CM | POA: Diagnosis not present

## 2017-07-08 DIAGNOSIS — R51 Headache: Secondary | ICD-10-CM | POA: Diagnosis not present

## 2017-07-08 DIAGNOSIS — W19XXXA Unspecified fall, initial encounter: Secondary | ICD-10-CM

## 2017-07-08 DIAGNOSIS — Y929 Unspecified place or not applicable: Secondary | ICD-10-CM | POA: Insufficient documentation

## 2017-07-08 DIAGNOSIS — S098XXA Other specified injuries of head, initial encounter: Secondary | ICD-10-CM | POA: Diagnosis not present

## 2017-07-08 DIAGNOSIS — M25562 Pain in left knee: Secondary | ICD-10-CM | POA: Diagnosis not present

## 2017-07-08 LAB — BASIC METABOLIC PANEL
Anion gap: 7 (ref 5–15)
BUN: 21 mg/dL — AB (ref 6–20)
CO2: 30 mmol/L (ref 22–32)
Calcium: 9.7 mg/dL (ref 8.9–10.3)
Chloride: 106 mmol/L (ref 101–111)
Creatinine, Ser: 1.27 mg/dL — ABNORMAL HIGH (ref 0.44–1.00)
GFR calc Af Amer: 42 mL/min — ABNORMAL LOW (ref >60)
GFR, EST NON AFRICAN AMERICAN: 36 mL/min — AB (ref >60)
GLUCOSE: 103 mg/dL — AB (ref 65–99)
POTASSIUM: 4.6 mmol/L (ref 3.5–5.1)
Sodium: 143 mmol/L (ref 135–145)

## 2017-07-08 LAB — CBC
HEMATOCRIT: 38.7 % (ref 36.0–46.0)
Hemoglobin: 12.1 g/dL (ref 12.0–15.0)
MCH: 29.5 pg (ref 26.0–34.0)
MCHC: 31.3 g/dL (ref 30.0–36.0)
MCV: 94.4 fL (ref 78.0–100.0)
PLATELETS: 165 10*3/uL (ref 150–400)
RBC: 4.1 MIL/uL (ref 3.87–5.11)
RDW: 13.5 % (ref 11.5–15.5)
WBC: 7.4 10*3/uL (ref 4.0–10.5)

## 2017-07-08 MED ORDER — LIDOCAINE-EPINEPHRINE (PF) 2 %-1:200000 IJ SOLN
20.0000 mL | Freq: Once | INTRAMUSCULAR | Status: AC
  Administered 2017-07-08: 20 mL
  Filled 2017-07-08: qty 20

## 2017-07-08 NOTE — ED Provider Notes (Signed)
LACERATION REPAIR Performed by: Alveria Apley Authorized by: Alveria Apley Consent: Verbal consent obtained. Risks and benefits: risks, benefits and alternatives were discussed Consent given by: patient Patient identity confirmed: provided demographic data Prepped and Draped in normal sterile fashion Wound explored  Laceration Location: R temple  Laceration Length: 1 cm  No Foreign Bodies seen or palpated  Anesthesia: local infiltration  Local anesthetic: lidocaine 2% with epinephrine  Anesthetic total: 2 ml  Irrigation method: syringe Amount of cleaning: standard  Skin closure: 5-0 prolene sutures  Number of sutures: 2  Technique: simple interrupted  Patient tolerance: Patient tolerated the procedure well with no immediate complications.    Alveria Apley, PA-C 07/08/17 1740    Linwood Dibbles, MD 07/12/17 [phone]

## 2017-07-08 NOTE — ED Provider Notes (Signed)
[facility] EMERGENCY DEPARTMENT Provider Note   CSN: [id number] Arrival date & time: 07/08/17  1430     History   Chief Complaint Fall  HPI [name] is a 84 y.o. female.  HPI Pt presents to the ED after a fall.  She was walking on concrete and fell striking her head.  Pt thinks her knee gave out.  She does have a history of a bad knee.  No LOC.  Pt has not been feeling sick.  She denies any trouble with chest pain or shortness of breath.  No abdominal pain.  No vomiting or diarrhea.  Patient denies any pain in her extremities other than her left knee but that started before her fall.  According to the EMS report the patient had repetitive questioning.   Past Medical History:  Diagnosis Date  . Arthritis   . Carotid artery occlusion    Bruit found August 2009  . Hypertension   . Hypothyroidism    Hypothyroidism  . Macular degeneration of left eye     Patient Active Problem List   Diagnosis Date Noted  . Occlusion and stenosis of carotid artery without mention of cerebral infarction 06/09/2011    Past Surgical History:  Procedure Laterality Date  . APPENDECTOMY    . CAROTID ENDARTERECTOMY  04/14/2008   Right  . CATARACT EXTRACTION    . LUMBAR LAMINECTOMY       OB History   None      Home Medications    Prior to Admission medications   Medication Sig Start Date End Date Taking? Authorizing Provider  aspirin 81 MG tablet Take 81 mg by mouth daily.   Yes [provider]  atenolol (TENORMIN) 25 MG tablet Take 25 mg by mouth daily.   Yes [provider]  atorvastatin (LIPITOR) 20 MG tablet Take 20 mg by mouth daily.   Yes [provider]  Calcium-Vitamin D (CALTRATE 600 PLUS-VIT D PO) Take 2 tablets by mouth daily.    Yes [provider]  levothyroxine (SYNTHROID, LEVOTHROID) 50 MCG tablet Take 50 mcg by mouth daily before breakfast.   Yes [provider]  lisinopril (PRINIVIL,ZESTRIL) 10 MG tablet  Take 10 mg by mouth daily.   Yes [provider]  OVER THE COUNTER MEDICATION Place 1 drop into both eyes 2 (two) times daily. OTC eye drop for macular degeneration   Yes [provider]    Family History Family History  Problem Relation Age of Onset  . Heart disease Mother        After age 28  . Stroke Mother   . Heart attack Mother   . Hypertension Mother   . Other Mother        Amputation of Leg- Because of a fall in Nursing Home.    Social History Social History   Tobacco Use  . Smoking status: Never Smoker  . Smokeless tobacco: Never Used  Substance Use Topics  . Alcohol use: No  . Drug use: No     Allergies   Patient has no known allergies.   Review of Systems Review of Systems  All other systems reviewed and are negative.    Physical Exam Updated Vital Signs BP (!) 170/54   Pulse 64   Resp 17   SpO2 98%   Physical Exam  HENT:  Head: Normocephalic.  Right Ear: External ear normal.  Left Ear: External ear normal.  Stellate laceration right temporal region, no facial tenderness  around the periorbital region  Eyes: Conjunctivae are normal. Right eye exhibits no discharge. Left eye exhibits no discharge. No scleral icterus.  Neck: Neck supple. No tracheal deviation present.  Cardiovascular: Normal rate, regular rhythm and intact distal pulses.  Pulmonary/Chest: Effort normal and breath sounds normal. No stridor. No respiratory distress. She has no wheezes. She has no rales.  Abdominal: Soft. Bowel sounds are normal. She exhibits no distension. There is no tenderness. There is no rebound and no guarding.  Musculoskeletal: She exhibits no edema.       Right shoulder: She exhibits no tenderness, no bony tenderness and no swelling.       Left shoulder: She exhibits no tenderness, no bony tenderness and no swelling.       Right wrist: She exhibits no tenderness, no bony tenderness and no swelling.       Left wrist: She exhibits no tenderness,  no bony tenderness and no swelling.       Right hip: She exhibits normal range of motion, no tenderness, no bony tenderness and no swelling.       Left hip: She exhibits normal range of motion, no tenderness and no bony tenderness.       Left knee: Tenderness found.       Right ankle: She exhibits no swelling. No tenderness.       Left ankle: She exhibits no swelling. No tenderness.       Cervical back: She exhibits no tenderness, no bony tenderness and no swelling.       Thoracic back: She exhibits no tenderness, no bony tenderness and no swelling.       Lumbar back: She exhibits no tenderness, no bony tenderness and no swelling.  Neurological: She is alert. She has normal strength. No cranial nerve deficit (no facial droop, extraocular movements intact, no slurred speech) or sensory deficit. She exhibits normal muscle tone. She displays no seizure activity. Coordination normal.  Skin: Skin is warm and dry. No rash noted. She is not diaphoretic.  Psychiatric: She has a normal mood and affect.  Nursing note and vitals reviewed.    ED Treatments / Results  Labs (all labs ordered are listed, but only abnormal results are displayed) Labs Reviewed  BASIC METABOLIC PANEL - Abnormal; Notable for the following components:      Result Value   Glucose, Bld 103 (*)    BUN 21 (*)    Creatinine, Ser 1.27 (*)    GFR calc non Af Amer 36 (*)    GFR calc Af Amer 42 (*)    All other components within normal limits  CBC    EKG EKG Interpretation  Date/Time:  Wednesday Jul 08 2017 15:34:03 EDT Ventricular Rate:  67 PR Interval:    QRS Duration: 141 QT Interval:  437 QTC Calculation: 462 R Axis:   -23 Text Interpretation:  Sinus rhythm Consider left atrial enlargement Right bundle branch block Left ventricular hypertrophy RBBB is new since last tracing Confirmed by Linwood Dibbles [id number]) on 07/08/2017 3:36:20 PM   Radiology Dg Knee 2 Views Left  Result Date: 07/08/2017 CLINICAL DATA:  Chronic  LEFT knee swelling not improved with cortisone injections. Fall today. EXAM: LEFT KNEE - 1-2 VIEW COMPARISON:  None. FINDINGS: No fracture deformity or dislocation. Moderate tricompartmental joint space narrowing with periarticular sclerosis and marginal spurring compatible with osteoarthrosis. No destructive bony lesions. Moderate vascular calcifications in pretibial phleboliths. Soft tissues are nonacute. IMPRESSION: No acute fracture deformity or dislocation. Moderate tricompartmental  osteoarthrosis. Electronically Signed   By: Awilda Metro M.D.   On: 07/08/2017 17:04   Ct Head Wo Contrast  Result Date: 07/08/2017 CLINICAL DATA:  84 year old female with head injury and headache following fall. Initial encounter. EXAM: CT HEAD WITHOUT CONTRAST TECHNIQUE: Contiguous axial images were obtained from the base of the skull through the vertex without intravenous contrast. COMPARISON:  None. FINDINGS: Brain: No evidence of acute infarction, hemorrhage, hydrocephalus, extra-axial collection or mass lesion/mass effect. Atrophy and chronic small-vessel white matter ischemic changes noted. Vascular: Atherosclerotic calcifications identified. Skull: Normal. Negative for fracture or focal lesion. Sinuses/Orbits: No acute finding. Other: None. IMPRESSION: 1. No evidence of acute intracranial abnormality 2. Atrophy and chronic small-vessel white matter ischemic changes. Electronically Signed   By: Harmon Pier M.D.   On: 07/08/2017 17:22    Procedures Procedures (including critical care time)  Medications Ordered in ED Medications  lidocaine-EPINEPHrine (XYLOCAINE W/EPI) 2 %-1:200000 (PF) injection 20 mL (20 mLs Infiltration Given 07/08/17 1551)     Initial Impression / Assessment and Plan / ED Course  I have reviewed the triage vital signs and the nursing notes.  Pertinent labs & imaging results that were available during my care of the patient were reviewed by me and considered in my medical decision  making (see chart for details).  Clinical Course as of Jul 08 1749  Wed Jul 08, 2017  1751 Patient's laboratory tests are reassuring.  CT scan does not show any evidence of serious injury.   [JK]  1751 Plain x-rays show arthritis as expected.   [JK]    Clinical Course User Index [JK] Linwood Dibbles, MD   Patient presented to the emergency room for evaluation after mechanical fall.  ED work-up is reassuring.Wound was repaired by PA Caccavale.  Stable for discharge.  Final Clinical Impressions(s) / ED Diagnoses   Final diagnoses:  Facial laceration, initial encounter  Fall, initial encounter  Arthritis of left knee    ED Discharge Orders    None       Linwood Dibbles, MD 07/08/17 1752

## 2017-07-08 NOTE — ED Triage Notes (Signed)
Pt in via [facility] EMS, pt had fall on concrete while ambulating at home to take trash out, pt has hematoma to R head and reported to ask the same question multiple times in route to hospital ("where is my purse?") pt A&O x4 upon arrival to ED, denies LOC, MAE, denies taking blood thinners

## 2017-07-08 NOTE — ED Notes (Signed)
Pt I c-t

## 2017-07-08 NOTE — ED Notes (Signed)
The pt is alert oriented  She has numerus bruises on her extremities.  No active bleeding from her rt fascial laceration  Pupils 3.0 bi-laterally and react to light.  She reports that she has visual problems  She has macular degeneration.

## 2017-07-08 NOTE — Discharge Instructions (Signed)
Suture removal in 7 days.  Apply antibiotic to the wound daily.   Keep a bandage over it.  The bruising around your eye should improve over the next few weeks.  Please review the discharge instructions

## 2017-07-15 DIAGNOSIS — W19XXXA Unspecified fall, initial encounter: Secondary | ICD-10-CM | POA: Diagnosis not present

## 2017-07-15 DIAGNOSIS — S01111A Laceration without foreign body of right eyelid and periocular area, initial encounter: Secondary | ICD-10-CM | POA: Diagnosis not present

## 2017-08-20 DIAGNOSIS — H53413 Scotoma involving central area, bilateral: Secondary | ICD-10-CM | POA: Diagnosis not present

## 2017-08-24 ENCOUNTER — Ambulatory Visit (HOSPITAL_COMMUNITY)
Admission: RE | Admit: 2017-08-24 | Discharge: 2017-08-24 | Disposition: A | Discharge status: Home / Self Care | Payer: Medicare Other | Source: Ambulatory Visit | Attending: Family | Admitting: Family

## 2017-08-24 ENCOUNTER — Other Ambulatory Visit: Payer: Self-pay

## 2017-08-24 ENCOUNTER — Ambulatory Visit: Payer: Medicare Other | Admitting: Family

## 2017-08-24 ENCOUNTER — Encounter: Payer: Self-pay | Admitting: Family

## 2017-08-24 VITALS — BP 161/60 | HR 60 | Resp 18 | Ht 61.0 in | Wt 134.0 lb

## 2017-08-24 DIAGNOSIS — Z9889 Other specified postprocedural states: Secondary | ICD-10-CM

## 2017-08-24 DIAGNOSIS — I6523 Occlusion and stenosis of bilateral carotid arteries: Secondary | ICD-10-CM

## 2017-08-24 NOTE — Patient Instructions (Signed)

## 2017-08-24 NOTE — Progress Notes (Signed)
Chief Complaint: Follow up Extracranial Carotid Artery Stenosis   History of Present Illness  [name] is a 84 y.o. female who is status post right carotid endarterectomy on 04/14/2008 by Dr. Myra Gianotti This was done for asymptomatic stenosis. Intraoperative findings included 90% stenosis.  The patient denies any history of TIA or stroke symptoms, specifically she denies a history of amaurosis fugax or monocular blindness, unilateralfacial drooping, hemiplegia, or receptive or expressive aphasia.   Pt states strokes and MI's run in her mother's family.  She denies dyspnea or cough.   She states her arthritis is about the same, has trouble using her hands, has had injections in her eye for macular degeneration. Her vision decline from macular degeneration concerns her the most.    Diabetic: No Tobacco use: non-smoker  Pt meds include: Statin : Yes ASA: Yes Other anticoagulants/antiplatelets: no    Past Medical History:  Diagnosis Date  . Arthritis   . Carotid artery occlusion    Bruit found August 2009  . Hypertension   . Hypothyroidism    Hypothyroidism  . Macular degeneration of left eye     Social History Social History   Tobacco Use  . Smoking status: Never Smoker  . Smokeless tobacco: Never Used  Substance Use Topics  . Alcohol use: No  . Drug use: No    Family History Family History  Problem Relation Age of Onset  . Heart disease Mother        After age 29  . Stroke Mother   . Heart attack Mother   . Hypertension Mother   . Other Mother        Amputation of Leg- Because of a fall in Nursing Home.    Surgical History Past Surgical History:  Procedure Laterality Date  . APPENDECTOMY    . CAROTID ENDARTERECTOMY  04/14/2008   Right  . CATARACT EXTRACTION    . LUMBAR LAMINECTOMY      No Known Allergies  Current Outpatient Medications  Medication Sig Dispense Refill  . aspirin 81 MG tablet Take 81 mg by mouth daily.    Marland Kitchen atenolol  (TENORMIN) 25 MG tablet Take 25 mg by mouth daily.    Marland Kitchen atorvastatin (LIPITOR) 20 MG tablet Take 20 mg by mouth daily.    . Calcium-Vitamin D (CALTRATE 600 PLUS-VIT D PO) Take 2 tablets by mouth daily.     Marland Kitchen levothyroxine (SYNTHROID, LEVOTHROID) 50 MCG tablet Take 50 mcg by mouth daily before breakfast.    . lisinopril (PRINIVIL,ZESTRIL) 10 MG tablet Take 10 mg by mouth daily.    Marland Kitchen OVER THE COUNTER MEDICATION Place 1 drop into both eyes 2 (two) times daily. OTC eye drop for macular degeneration     No current facility-administered medications for this visit.     Review of Systems : See HPI for pertinent positives and negatives.  Physical Examination  Vitals:   08/24/17 1038 08/24/17 1040  BP: (!) 142/62 (!) 161/60  Pulse: 60   Resp: 18   SpO2: 100%   Weight: 134 lb (60.8 kg)   Height: 5\' 1"  (1.549 m)    Body mass index is 25.32 kg/m.  General:WDWN elderly female in NAD GAIT:slow and deliberate, using cane Eyes: PERRLA Pulmonary: Respirations are non-labored, fine rales in left base, no rhonchi or wheezes.   Cardiac: regular rhythm and rate, + murmur.  VASCULAR EXAM Carotid Bruits Right Left   Transmitted cardiac murmur Transmitted cardiac murmur    Abdominal aortic pulse is not  palpable. Radial pulses are 2+ palpable and equal.      LE Pulses Right Left   POPLITEAL not palpable  not palpable   POSTERIOR TIBIAL 1+ palpable  1+ palpable    DORSALIS PEDIS  ANTERIOR TIBIAL not palpable  1+ palpable    Gastrointestinal: soft, nontender, BS WNL, no r/g,no palpated masses. Musculoskeletal:  Age appropriate muscle atrophy/wasting. M/S 4/5 throughout, Extremities without ischemic changes. Arthritic changes in hands. Skin: No rashes, no ulcers, no cellulitis.   Neurologic:  A&O X 3; appropriate affect,  sensation is normal; speech is normal, CN 2-12 intact except is hard of hearing, pain and light touch intact in extremities, motor exam as listed above.   Psychiatric: Normal thought content, mood appropriate to clinical situation.    Assessment: Mellody LifeMary [name] is a 84 y.o. female who is status post right carotid endarterectomy on 04/14/2008.  She has no history of stroke or TIA. Fortunately she does not have DM and has never used tobacco.   DATA Carotid Duplex (08-24-17): Right ICA: CEA site with 1-39% stenosis Left ICA: 40-59% stenosis Bilateral vertebral artery flow is antegrade.  Bilateral subclavian artery waveforms are normal.  Slight increased stenosis of the right ICA, CEA site, compared to the exam on 07-28-16.   Plan: Follow-up in 1 year with Carotid Duplex scan.   I discussed in depth with the patient the nature of atherosclerosis, and emphasized the importance of maximal medical management including strict control of blood pressure, blood glucose, and lipid levels, obtaining regular exercise, and continued cessation of smoking.  The patient is aware that without maximal medical management the underlying atherosclerotic disease process will progress, limiting the benefit of any interventions. The patient was given information about stroke prevention and what symptoms should prompt the patient to seek immediate medical care. Thank you for allowing us to participate in this patient's care.  Charisse MarchSuzanne Nickel, RN, MSN, FNP-C [facility] Office: [phone]  Clinic Physician: Myra GianottiBrabham  08/24/17 10:42 AM

## 2017-08-27 DIAGNOSIS — H53413 Scotoma involving central area, bilateral: Secondary | ICD-10-CM | POA: Diagnosis not present

## 2017-09-03 DIAGNOSIS — H53413 Scotoma involving central area, bilateral: Secondary | ICD-10-CM | POA: Diagnosis not present

## 2017-11-17 DIAGNOSIS — H43813 Vitreous degeneration, bilateral: Secondary | ICD-10-CM | POA: Diagnosis not present

## 2017-11-17 DIAGNOSIS — H3540 Unspecified peripheral retinal degeneration: Secondary | ICD-10-CM | POA: Diagnosis not present

## 2017-11-17 DIAGNOSIS — H353232 Exudative age-related macular degeneration, bilateral, with inactive choroidal neovascularization: Secondary | ICD-10-CM | POA: Diagnosis not present

## 2017-11-24 DIAGNOSIS — N179 Acute kidney failure, unspecified: Secondary | ICD-10-CM

## 2017-11-24 HISTORY — DX: Acute kidney failure, unspecified: N17.9

## 2017-12-15 ENCOUNTER — Encounter (HOSPITAL_COMMUNITY): Payer: Self-pay

## 2017-12-15 ENCOUNTER — Observation Stay (HOSPITAL_COMMUNITY)
Admission: EM | Admit: 2017-12-15 | Discharge: 2017-12-18 | Disposition: A | Discharge status: Skilled Nursing Facility | Payer: Medicare Other | Attending: Family Medicine | Admitting: Family Medicine

## 2017-12-15 ENCOUNTER — Emergency Department (HOSPITAL_COMMUNITY): Payer: Medicare Other

## 2017-12-15 ENCOUNTER — Other Ambulatory Visit: Payer: Self-pay

## 2017-12-15 DIAGNOSIS — N183 Chronic kidney disease, stage 3 (moderate): Secondary | ICD-10-CM | POA: Diagnosis not present

## 2017-12-15 DIAGNOSIS — I959 Hypotension, unspecified: Secondary | ICD-10-CM | POA: Diagnosis not present

## 2017-12-15 DIAGNOSIS — I129 Hypertensive chronic kidney disease with stage 1 through stage 4 chronic kidney disease, or unspecified chronic kidney disease: Secondary | ICD-10-CM | POA: Diagnosis not present

## 2017-12-15 DIAGNOSIS — N390 Urinary tract infection, site not specified: Secondary | ICD-10-CM | POA: Diagnosis present

## 2017-12-15 DIAGNOSIS — R2681 Unsteadiness on feet: Secondary | ICD-10-CM | POA: Diagnosis not present

## 2017-12-15 DIAGNOSIS — Z823 Family history of stroke: Secondary | ICD-10-CM | POA: Insufficient documentation

## 2017-12-15 DIAGNOSIS — Z79890 Hormone replacement therapy (postmenopausal): Secondary | ICD-10-CM | POA: Diagnosis not present

## 2017-12-15 DIAGNOSIS — W010XXA Fall on same level from slipping, tripping and stumbling without subsequent striking against object, initial encounter: Secondary | ICD-10-CM

## 2017-12-15 DIAGNOSIS — M25519 Pain in unspecified shoulder: Secondary | ICD-10-CM | POA: Diagnosis not present

## 2017-12-15 DIAGNOSIS — Z8249 Family history of ischemic heart disease and other diseases of the circulatory system: Secondary | ICD-10-CM | POA: Insufficient documentation

## 2017-12-15 DIAGNOSIS — B372 Candidiasis of skin and nail: Secondary | ICD-10-CM | POA: Diagnosis not present

## 2017-12-15 DIAGNOSIS — W19XXXA Unspecified fall, initial encounter: Secondary | ICD-10-CM | POA: Diagnosis not present

## 2017-12-15 DIAGNOSIS — B9620 Unspecified Escherichia coli [E. coli] as the cause of diseases classified elsewhere: Secondary | ICD-10-CM | POA: Insufficient documentation

## 2017-12-15 DIAGNOSIS — R55 Syncope and collapse: Secondary | ICD-10-CM | POA: Diagnosis not present

## 2017-12-15 DIAGNOSIS — N3001 Acute cystitis with hematuria: Secondary | ICD-10-CM

## 2017-12-15 DIAGNOSIS — Z7982 Long term (current) use of aspirin: Secondary | ICD-10-CM | POA: Diagnosis not present

## 2017-12-15 DIAGNOSIS — I10 Essential (primary) hypertension: Secondary | ICD-10-CM

## 2017-12-15 DIAGNOSIS — M6281 Muscle weakness (generalized): Secondary | ICD-10-CM | POA: Diagnosis not present

## 2017-12-15 DIAGNOSIS — Z23 Encounter for immunization: Secondary | ICD-10-CM | POA: Insufficient documentation

## 2017-12-15 DIAGNOSIS — E860 Dehydration: Secondary | ICD-10-CM | POA: Diagnosis not present

## 2017-12-15 DIAGNOSIS — R296 Repeated falls: Secondary | ICD-10-CM | POA: Insufficient documentation

## 2017-12-15 DIAGNOSIS — Z9841 Cataract extraction status, right eye: Secondary | ICD-10-CM | POA: Insufficient documentation

## 2017-12-15 DIAGNOSIS — T796XXA Traumatic ischemia of muscle, initial encounter: Secondary | ICD-10-CM | POA: Diagnosis not present

## 2017-12-15 DIAGNOSIS — R52 Pain, unspecified: Secondary | ICD-10-CM | POA: Diagnosis not present

## 2017-12-15 DIAGNOSIS — L89152 Pressure ulcer of sacral region, stage 2: Secondary | ICD-10-CM | POA: Insufficient documentation

## 2017-12-15 DIAGNOSIS — N179 Acute kidney failure, unspecified: Secondary | ICD-10-CM

## 2017-12-15 DIAGNOSIS — E039 Hypothyroidism, unspecified: Secondary | ICD-10-CM | POA: Diagnosis not present

## 2017-12-15 DIAGNOSIS — E785 Hyperlipidemia, unspecified: Secondary | ICD-10-CM | POA: Diagnosis not present

## 2017-12-15 DIAGNOSIS — M1990 Unspecified osteoarthritis, unspecified site: Secondary | ICD-10-CM | POA: Diagnosis not present

## 2017-12-15 DIAGNOSIS — Z79899 Other long term (current) drug therapy: Secondary | ICD-10-CM | POA: Diagnosis not present

## 2017-12-15 HISTORY — DX: Urinary tract infection, site not specified: N39.0

## 2017-12-15 HISTORY — DX: Acute kidney failure, unspecified: N17.9

## 2017-12-15 HISTORY — DX: Repeated falls: R29.6

## 2017-12-15 LAB — COMPREHENSIVE METABOLIC PANEL
ALBUMIN: 3.3 g/dL — AB (ref 3.5–5.0)
ALT: 26 U/L (ref 0–44)
ANION GAP: 9 (ref 5–15)
AST: 74 U/L — AB (ref 15–41)
Alkaline Phosphatase: 75 U/L (ref 38–126)
BILIRUBIN TOTAL: 1.1 mg/dL (ref 0.3–1.2)
BUN: 41 mg/dL — ABNORMAL HIGH (ref 8–23)
CHLORIDE: 107 mmol/L (ref 98–111)
CO2: 25 mmol/L (ref 22–32)
Calcium: 9.5 mg/dL (ref 8.9–10.3)
Creatinine, Ser: 1.78 mg/dL — ABNORMAL HIGH (ref 0.44–1.00)
GFR calc Af Amer: 28 mL/min — ABNORMAL LOW (ref >60)
GFR calc non Af Amer: 24 mL/min — ABNORMAL LOW (ref >60)
GLUCOSE: 108 mg/dL — AB (ref 70–99)
POTASSIUM: 4.5 mmol/L (ref 3.5–5.1)
SODIUM: 141 mmol/L (ref 135–145)
Total Protein: 6.7 g/dL (ref 6.5–8.1)

## 2017-12-15 LAB — URINALYSIS, ROUTINE W REFLEX MICROSCOPIC
Bilirubin Urine: NEGATIVE
GLUCOSE, UA: NEGATIVE mg/dL
KETONES UR: NEGATIVE mg/dL
Nitrite: NEGATIVE
PROTEIN: 30 mg/dL — AB
Specific Gravity, Urine: 1.016 (ref 1.005–1.030)
pH: 5 (ref 5.0–8.0)

## 2017-12-15 LAB — CBC
HCT: 43.3 % (ref 36.0–46.0)
Hemoglobin: 13.7 g/dL (ref 12.0–15.0)
MCH: 29.5 pg (ref 26.0–34.0)
MCHC: 31.6 g/dL (ref 30.0–36.0)
MCV: 93.1 fL (ref 80.0–100.0)
NRBC: 0 % (ref 0.0–0.2)
PLATELETS: 180 10*3/uL (ref 150–400)
RBC: 4.65 MIL/uL (ref 3.87–5.11)
RDW: 13.4 % (ref 11.5–15.5)
WBC: 16.6 10*3/uL — AB (ref 4.0–10.5)

## 2017-12-15 LAB — CK: CK TOTAL: 2658 U/L — AB (ref 38–234)

## 2017-12-15 MED ORDER — ATORVASTATIN CALCIUM 20 MG PO TABS
20.0000 mg | ORAL_TABLET | Freq: Every day | ORAL | Status: DC
  Administered 2017-12-16 – 2017-12-18 (×3): 20 mg via ORAL
  Filled 2017-12-15 (×3): qty 1

## 2017-12-15 MED ORDER — ASPIRIN 81 MG PO CHEW
81.0000 mg | CHEWABLE_TABLET | Freq: Every day | ORAL | Status: DC
  Administered 2017-12-16 – 2017-12-18 (×3): 81 mg via ORAL
  Filled 2017-12-15 (×3): qty 1

## 2017-12-15 MED ORDER — ATENOLOL 50 MG PO TABS
25.0000 mg | ORAL_TABLET | Freq: Every day | ORAL | Status: DC
  Administered 2017-12-16 – 2017-12-18 (×3): 25 mg via ORAL
  Filled 2017-12-15 (×3): qty 1

## 2017-12-15 MED ORDER — HEPARIN SODIUM (PORCINE) 5000 UNIT/ML IJ SOLN
5000.0000 [IU] | Freq: Three times a day (TID) | INTRAMUSCULAR | Status: DC
  Administered 2017-12-16 – 2017-12-18 (×9): 5000 [IU] via SUBCUTANEOUS
  Filled 2017-12-15 (×9): qty 1

## 2017-12-15 MED ORDER — SODIUM CHLORIDE 0.9 % IV SOLN
1.0000 g | INTRAVENOUS | Status: DC
  Administered 2017-12-16 – 2017-12-18 (×3): 1 g via INTRAVENOUS
  Filled 2017-12-15 (×3): qty 10

## 2017-12-15 MED ORDER — LISINOPRIL 10 MG PO TABS
10.0000 mg | ORAL_TABLET | Freq: Every day | ORAL | Status: DC
  Administered 2017-12-16: 10 mg via ORAL
  Filled 2017-12-15: qty 1

## 2017-12-15 MED ORDER — LEVOTHYROXINE SODIUM 50 MCG PO TABS
50.0000 ug | ORAL_TABLET | Freq: Every day | ORAL | Status: DC
  Administered 2017-12-16 – 2017-12-18 (×3): 50 ug via ORAL
  Filled 2017-12-15 (×4): qty 1

## 2017-12-15 MED ORDER — SODIUM CHLORIDE 0.9 % IV BOLUS
1000.0000 mL | Freq: Once | INTRAVENOUS | Status: AC
  Administered 2017-12-15: 1000 mL via INTRAVENOUS

## 2017-12-15 MED ORDER — SODIUM CHLORIDE 0.9 % IV SOLN
1.0000 g | Freq: Once | INTRAVENOUS | Status: AC
  Administered 2017-12-15: 1 g via INTRAVENOUS
  Filled 2017-12-15: qty 10

## 2017-12-15 MED ORDER — ONDANSETRON HCL 4 MG PO TABS: 4.0000 mg | ORAL_TABLET | Freq: Four times a day (QID) | ORAL | Status: DC | PRN

## 2017-12-15 MED ORDER — ONDANSETRON HCL 4 MG/2ML IJ SOLN: 4.0000 mg | Freq: Four times a day (QID) | INTRAMUSCULAR | Status: DC | PRN

## 2017-12-15 MED ORDER — SODIUM CHLORIDE 0.9 % IV SOLN
INTRAVENOUS | Status: DC
  Administered 2017-12-16: via INTRAVENOUS
  Administered 2017-12-16 – 2017-12-17 (×3): 1000 mL via INTRAVENOUS

## 2017-12-15 MED ORDER — SODIUM CHLORIDE 0.9 % IV BOLUS
500.0000 mL | Freq: Once | INTRAVENOUS | Status: AC
  Administered 2017-12-15: 500 mL via INTRAVENOUS

## 2017-12-15 NOTE — ED Triage Notes (Signed)
Pt arrives EMS from home where she was found on floor by neighbors after unable to contact this am.Pt had connected cell phone with 536 minutes elapsed. Pt does not remember fall. Pt arrives with vomit on front of house coat. Incontinence  Noted. 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

## 2017-12-15 NOTE — H&P (Signed)
History and Physical   Mariabella Nilsen ZOX:[id number] DOB: 1925-01-21 DOA: 12/15/2017  Referring MD/NP/PA: Cathren Laine, MD  PCP: Jarrett Soho, PA-C   Patient coming from: Home  Chief Complaint: Fall  HPI: [name] is a 84 y.o. female with medical history significant of hypertension, carotid artery occlusion, hypothyroidism who was brought in to the ER with fall.  Patient apparently was last seen yesterday.  She lives by herself at home.  Neighbors tried to contact them where unable to so they went to her place today and found her on the floor.  She denied any chest pain.  No fever no chills.  Patient is a poor historian and feels weak.  She was unable to give adequate history right now.  She appears slightly unkempt and dehydrated.  Patient was found to have acute kidney injury with UTI and is being admitted to the hospital for treatment.  No family is currently available to give more details.  ED Course: Her temperature is 97.8 to blood pressure 161/69, pulse is 81 respiratory 22 oxygen sats 95% on room air.  Patient has a white count of 16.6 with a BUN of 41 creatinine 1.78 glucose 108.  Urinalysis showed large leukocytes negative nitrite many bacteria WBC 11-20.  Head CT without contrast is negative.  EKG showed no significant findings.  Patient suspected to have had syncopal episode probably secondary to dehydration and UTI.  She is being admitted to the hospital for treatment.  Review of Systems: As per HPI otherwise 10 point review of systems negative.    Past Medical History:  Diagnosis Date  . Arthritis   . Carotid artery occlusion    Bruit found August 2009  . Hypertension   . Hypothyroidism    Hypothyroidism  . Macular degeneration of left eye     Past Surgical History:  Procedure Laterality Date  . APPENDECTOMY    . CAROTID ENDARTERECTOMY  04/14/2008   Right  . CATARACT EXTRACTION    . LUMBAR LAMINECTOMY       reports that she has never smoked. She has never used  smokeless tobacco. She reports that she does not drink alcohol or use drugs.  No Known Allergies  Family History  Problem Relation Age of Onset  . Heart disease Mother        After age 78  . Stroke Mother   . Heart attack Mother   . Hypertension Mother   . Other Mother        Amputation of Leg- Because of a fall in Nursing Home.     Prior to Admission medications   Medication Sig Start Date End Date Taking? Authorizing Provider  aspirin 81 MG tablet Take 81 mg by mouth daily.   Yes [provider]  atenolol (TENORMIN) 25 MG tablet Take 25 mg by mouth daily.   Yes [provider]  atorvastatin (LIPITOR) 20 MG tablet Take 20 mg by mouth daily.   Yes [provider]  Calcium-Vitamin D (CALTRATE 600 PLUS-VIT D PO) Take 2 tablets by mouth daily.    Yes [provider]  lisinopril (PRINIVIL,ZESTRIL) 10 MG tablet Take 10 mg by mouth daily.   Yes [provider]  OVER THE COUNTER MEDICATION Place 1 drop into both eyes 2 (two) times daily. OTC eye drop for macular degeneration   Yes [provider]  levothyroxine (SYNTHROID, LEVOTHROID) 75 MCG tablet Take 50 mcg by mouth daily before breakfast.     [provider]  Physical Exam: Vitals:   12/15/17 1830 12/15/17 1845 12/15/17 1900 12/15/17 1915  BP: 120/62 (!) 128/46 (!) 140/46 (!) 127/39  Pulse: 81 78 78 77  Resp: 19 (!) 21 (!) 22 20  Temp:      TempSrc:      SpO2: 96% 95% 96% 95%  Weight:      Height:          Constitutional: NAD, calm, comfortable, drowsy, confused Vitals:   12/15/17 1830 12/15/17 1845 12/15/17 1900 12/15/17 1915  BP: 120/62 (!) 128/46 (!) 140/46 (!) 127/39  Pulse: 81 78 78 77  Resp: 19 (!) 21 (!) 22 20  Temp:      TempSrc:      SpO2: 96% 95% 96% 95%  Weight:      Height:       Eyes: PERRL, lids and conjunctivae normal ENMT: Mucous membranes are moist. Posterior pharynx clear of any exudate or lesions.Normal dentition.  Neck: normal,  supple, no masses, no thyromegaly Respiratory: clear to auscultation bilaterally, no wheezing, no crackles. Normal respiratory effort. No accessory muscle use.  Cardiovascular: Regular rate and rhythm, no murmurs / rubs / gallops. No extremity edema. 2+ pedal pulses. No carotid bruits.  Abdomen: no tenderness, no masses palpated. No hepatosplenomegaly. Bowel sounds positive.  Musculoskeletal: no clubbing / cyanosis. No joint deformity upper and lower extremities. Good ROM, no contractures. Normal muscle tone.  Skin: Dry mucous membrane and skin, no rashes, lesions, ulcers. No induration Neurologic: CN 2-12 grossly intact. Sensation intact, DTR normal. Strength 5/5 in all 4.  Psychiatric: Normal judgment and insight. Alert and oriented x 3. Normal mood.     Labs on Admission: I have personally reviewed following labs and imaging studies  CBC: Recent Labs  Lab 12/15/17 1540  WBC 16.6*  HGB 13.7  HCT 43.3  MCV 93.1  PLT 180   Basic Metabolic Panel: Recent Labs  Lab 12/15/17 1540  NA 141  K 4.5  CL 107  CO2 25  GLUCOSE 108*  BUN 41*  CREATININE 1.78*  CALCIUM 9.5   GFR: Estimated Creatinine Clearance: 17.4 mL/min (A) (by C-G formula based on SCr of 1.78 mg/dL (H)). Liver Function Tests: Recent Labs  Lab 12/15/17 1540  AST 74*  ALT 26  ALKPHOS 75  BILITOT 1.1  PROT 6.7  ALBUMIN 3.3*   No results for input(s): LIPASE, AMYLASE in the last 168 hours. No results for input(s): AMMONIA in the last 168 hours. Coagulation Profile: No results for input(s): INR, PROTIME in the last 168 hours. Cardiac Enzymes: Recent Labs  Lab 12/15/17 1540  CKTOTAL 2,658*   BNP (last 3 results) No results for input(s): PROBNP in the last 8760 hours. HbA1C: No results for input(s): HGBA1C in the last 72 hours. CBG: No results for input(s): GLUCAP in the last 168 hours. Lipid Profile: No results for input(s): CHOL, HDL, LDLCALC, TRIG, CHOLHDL, LDLDIRECT in the last 72 hours. Thyroid  Function Tests: No results for input(s): TSH, T4TOTAL, FREET4, T3FREE, THYROIDAB in the last 72 hours. Anemia Panel: No results for input(s): VITAMINB12, FOLATE, FERRITIN, TIBC, IRON, RETICCTPCT in the last 72 hours. Urine analysis:    Component Value Date/Time   COLORURINE YELLOW 12/15/2017 1658   APPEARANCEUR HAZY (A) 12/15/2017 1658   LABSPEC 1.016 12/15/2017 1658   PHURINE 5.0 12/15/2017 1658   GLUCOSEU NEGATIVE 12/15/2017 1658   HGBUR LARGE (A) 12/15/2017 1658   BILIRUBINUR NEGATIVE 12/15/2017 1658   KETONESUR NEGATIVE 12/15/2017 1658   PROTEINUR 30 (  A) 12/15/2017 1658   UROBILINOGEN 0.2 04/10/2008 1519   NITRITE NEGATIVE 12/15/2017 1658   LEUKOCYTESUR LARGE (A) 12/15/2017 1658   Sepsis Labs: @LABRCNTIP (procalcitonin:4,lacticidven:4) )No results found for this or any previous visit (from the past 240 hour(s)).   Radiological Exams on Admission: Ct Head Wo Contrast  Result Date: 12/15/2017 CLINICAL DATA:  Found on floor EXAM: CT HEAD WITHOUT CONTRAST TECHNIQUE: Contiguous axial images were obtained from the base of the skull through the vertex without intravenous contrast. COMPARISON:  07/08/2017 FINDINGS: Brain: No acute territorial infarction, hemorrhage or intracranial mass. Marked atrophy. Mild small vessel ischemic changes of the white matter. Vascular: No hyperdense vessels. Vertebral and carotid vascular calcification Skull: Normal. Negative for fracture or focal lesion. Sinuses/Orbits: Mucosal thickening in the maxillary and ethmoid sinuses. Other: None IMPRESSION: 1. No CT evidence for acute intracranial abnormality. 2. Atrophy and small vessel ischemic changes of the white matter Electronically Signed   By: Jasmine Pang M.D.   On: 12/15/2017 18:08    EKG: Independently reviewed.  It shows normal sinus rhythm with a rate of 69, no significant ST changes, prominent peaked T waves but unchanged from previous  Assessment/Plan Principal Problem:   Fall Active Problems:    UTI (urinary tract infection)   ARF (acute renal failure) (HCC)   Hypertension   Hypothyroidism   Hyperlipidemia     #1 fall: Not clear if this was syncope or mechanical in nature.  Patient is a poor historian.  Will admit the patient for observation.  Hydrate patient and treat UTI.  Physical therapy and Occupational Therapy consult.  Safety at home will be evaluated for patient's disposition.  May consider echocardiogram and MRI of the brain if no improvement with hydration  #2 urinary tract infection: Empirically start on Rocephin.  Urine culture and sensitivities and blood cultures have been ordered.  #3 acute renal failure: Most likely prerenal in nature.  Continue aggressive hydration.  #4 hypertension: Blood pressure is stable at this point.  Will check orthostatics.  #5 hypothyroidism: Continue levothyroxine and check TSH.  #6 hyperlipidemia: Continue statin.   DVT prophylaxis: Heparin Code Status: Full Family Communication: No family available Disposition Plan: To be determined Consults called: None at this point Admission status: Observation  Severity of Illness: The appropriate patient status for this patient is OBSERVATION. Observation status is judged to be reasonable and necessary in order to provide the required intensity of service to ensure the patient's safety. The patient's presenting symptoms, physical exam findings, and initial radiographic and laboratory data in the context of their medical condition is felt to place them at decreased risk for further clinical deterioration. Furthermore, it is anticipated that the patient will be medically stable for discharge from the hospital within 2 midnights of admission. The following factors support the patient status of observation.   " The patient's presenting symptoms include fall . " The physical exam findings include dehydration and confusion. " The initial radiographic and laboratory data are acute kidney  injury.     Lonia Blood MD [facility] Pager [phone]  If 7PM-7AM, please contact night-coverage [email or web address] Password [email or web address]  12/15/2017, 10:06 PM

## 2017-12-15 NOTE — ED Provider Notes (Signed)
[facility] EMERGENCY DEPARTMENT Provider Note   CSN: [id number] Arrival date & time: 12/15/17  1526     History   Chief Complaint Chief Complaint  Patient presents with  . Fall    HPI [name] is a 84 y.o. female.  Patient s/p fall at home - pt found on floor by neighbors today after they were unable to contact patient. Unclear how long pt on floor, states she remembers closing blinds last night, but not much after that. Pt very limited historian - level 5 caveat.  Pt denies fevers. Denies headache, chest pain or any discomfort or pain.   The history is provided by the patient and the EMS personnel. The history is limited by the condition of the patient.  Fall  Pertinent negatives include no chest pain, no abdominal pain, no headaches and no shortness of breath.    Past Medical History:  Diagnosis Date  . Arthritis   . Carotid artery occlusion    Bruit found August 2009  . Hypertension   . Hypothyroidism    Hypothyroidism  . Macular degeneration of left eye     Patient Active Problem List   Diagnosis Date Noted  . Occlusion and stenosis of carotid artery without mention of cerebral infarction 06/09/2011    Past Surgical History:  Procedure Laterality Date  . APPENDECTOMY    . CAROTID ENDARTERECTOMY  04/14/2008   Right  . CATARACT EXTRACTION    . LUMBAR LAMINECTOMY       OB History   None      Home Medications    Prior to Admission medications   Medication Sig Start Date End Date Taking? Authorizing Provider  aspirin 81 MG tablet Take 81 mg by mouth daily.    [provider]  atenolol (TENORMIN) 25 MG tablet Take 25 mg by mouth daily.    [provider]  atorvastatin (LIPITOR) 20 MG tablet Take 20 mg by mouth daily.    [provider]  Calcium-Vitamin D (CALTRATE 600 PLUS-VIT D PO) Take 2 tablets by mouth daily.     [provider]  levothyroxine (SYNTHROID, LEVOTHROID) 50 MCG tablet Take 50 mcg  by mouth daily before breakfast.    [provider]  lisinopril (PRINIVIL,ZESTRIL) 10 MG tablet Take 10 mg by mouth daily.    [provider]  OVER THE COUNTER MEDICATION Place 1 drop into both eyes 2 (two) times daily. OTC eye drop for macular degeneration    [provider]    Family History Family History  Problem Relation Age of Onset  . Heart disease Mother        After age 49  . Stroke Mother   . Heart attack Mother   . Hypertension Mother   . Other Mother        Amputation of Leg- Because of a fall in Nursing Home.    Social History Social History   Tobacco Use  . Smoking status: Never Smoker  . Smokeless tobacco: Never Used  Substance Use Topics  . Alcohol use: No  . Drug use: No     Allergies   Patient has no known allergies.   Review of Systems Review of Systems  Constitutional: Negative for fever.  HENT: Negative for sore throat.   Eyes: Negative for redness.  Respiratory: Negative for shortness of breath.   Cardiovascular: Negative for chest pain.  Gastrointestinal: Negative for abdominal pain.  Genitourinary: Negative for flank pain.  Musculoskeletal: Negative for  back pain and neck pain.  Skin: Negative for rash.  Neurological: Negative for headaches.  Hematological: Does not bruise/bleed easily.  Psychiatric/Behavioral: Positive for confusion.     Physical Exam Updated Vital Signs BP (!) 138/49   Pulse 69   Temp 97.8 F (36.6 C) (Oral)   Resp 17   Ht 1.549 m (5\' 1" )   Wt 62.1 kg   SpO2 95%   BMI 25.89 kg/m   Physical Exam  Constitutional: She appears well-developed and well-nourished.  HENT:  Mouth/Throat: Oropharynx is clear and moist.  Eyes: Pupils are equal, round, and reactive to light. Conjunctivae are normal. No scleral icterus.  Neck: Normal range of motion. Neck supple. No tracheal deviation present. No thyromegaly present.  No bruits. No stiffness or rigidity.   Cardiovascular: Normal rate,  regular rhythm, normal heart sounds and intact distal pulses. Exam reveals no gallop and no friction rub.  No murmur heard. Pulmonary/Chest: Effort normal and breath sounds normal. No respiratory distress. She exhibits no tenderness.  Abdominal: Soft. Normal appearance and bowel sounds are normal. She exhibits no distension and no mass. There is no tenderness. There is no rebound and no guarding.  Genitourinary:  Genitourinary Comments: No cva tenderness.   Musculoskeletal: She exhibits no edema.  CTLS spine, non tender, aligned, no step off. Good rom bil extremities without pain or focal bony tenderness.   Neurological: She is alert.  Speech clear, not grossly dysarthric. Motor intact bil, stre 5/5. sens grossly intact.   Skin: Skin is warm and dry. No rash noted.  Psychiatric: She has a normal mood and affect.  Nursing note and vitals reviewed.    ED Treatments / Results  Labs (all labs ordered are listed, but only abnormal results are displayed) Results for orders placed or performed during the hospital encounter of 12/15/17  CBC  Result Value Ref Range   WBC 16.6 (H) 4.0 - 10.5 K/uL   RBC 4.65 3.87 - 5.11 MIL/uL   Hemoglobin 13.7 12.0 - 15.0 g/dL   HCT 78.2 95.6 - 21.3 %   MCV 93.1 80.0 - 100.0 fL   MCH 29.5 26.0 - 34.0 pg   MCHC 31.6 30.0 - 36.0 g/dL   RDW 08.6 57.8 - 46.9 %   Platelets 180 150 - 400 K/uL   nRBC 0.0 0.0 - 0.2 %  Comprehensive metabolic panel  Result Value Ref Range   Sodium 141 135 - 145 mmol/L   Potassium 4.5 3.5 - 5.1 mmol/L   Chloride 107 98 - 111 mmol/L   CO2 25 22 - 32 mmol/L   Glucose, Bld 108 (H) 70 - 99 mg/dL   BUN 41 (H) 8 - 23 mg/dL   Creatinine, Ser 6.29 (H) 0.44 - 1.00 mg/dL   Calcium 9.5 8.9 - 52.8 mg/dL   Total Protein 6.7 6.5 - 8.1 g/dL   Albumin 3.3 (L) 3.5 - 5.0 g/dL   AST 74 (H) 15 - 41 U/L   ALT 26 0 - 44 U/L   Alkaline Phosphatase 75 38 - 126 U/L   Total Bilirubin 1.1 0.3 - 1.2 mg/dL   GFR calc non Af Amer 24 (L) >60 mL/min    GFR calc Af Amer 28 (L) >60 mL/min   Anion gap 9 5 - 15  Urinalysis, Routine w reflex microscopic  Result Value Ref Range   Color, Urine YELLOW YELLOW   APPearance HAZY (A) CLEAR   Specific Gravity, Urine 1.016 1.005 - 1.030   pH 5.0 5.0 -  8.0   Glucose, UA NEGATIVE NEGATIVE mg/dL   Hgb urine dipstick LARGE (A) NEGATIVE   Bilirubin Urine NEGATIVE NEGATIVE   Ketones, ur NEGATIVE NEGATIVE mg/dL   Protein, ur 30 (A) NEGATIVE mg/dL   Nitrite NEGATIVE NEGATIVE   Leukocytes, UA LARGE (A) NEGATIVE   RBC / HPF 6-10 0 - 5 RBC/hpf   WBC, UA 11-20 0 - 5 WBC/hpf   Bacteria, UA MANY (A) NONE SEEN   Squamous Epithelial / LPF 0-5 0 - 5  CK  Result Value Ref Range   Total CK 2,658 (H) 38 - 234 U/L   Ct Head Wo Contrast  Result Date: 12/15/2017 CLINICAL DATA:  Found on floor EXAM: CT HEAD WITHOUT CONTRAST TECHNIQUE: Contiguous axial images were obtained from the base of the skull through the vertex without intravenous contrast. COMPARISON:  07/08/2017 FINDINGS: Brain: No acute territorial infarction, hemorrhage or intracranial mass. Marked atrophy. Mild small vessel ischemic changes of the white matter. Vascular: No hyperdense vessels. Vertebral and carotid vascular calcification Skull: Normal. Negative for fracture or focal lesion. Sinuses/Orbits: Mucosal thickening in the maxillary and ethmoid sinuses. Other: None IMPRESSION: 1. No CT evidence for acute intracranial abnormality. 2. Atrophy and small vessel ischemic changes of the white matter Electronically Signed   By: Jasmine Pang M.D.   On: 12/15/2017 18:08    EKG EKG Interpretation  Date/Time:  Tuesday December 15 2017 15:41:04 EDT Ventricular Rate:  69 PR Interval:    QRS Duration: 101 QT Interval:  452 QTC Calculation: 485 R Axis:   7 Text Interpretation:  Sinus rhythm Left ventricular hypertrophy `prominent/peaked t waves Confirmed by Cathren Laine ([id number]) on 12/15/2017 3:56:28 PM   Radiology Ct Head Wo Contrast  Result Date:  12/15/2017 CLINICAL DATA:  Found on floor EXAM: CT HEAD WITHOUT CONTRAST TECHNIQUE: Contiguous axial images were obtained from the base of the skull through the vertex without intravenous contrast. COMPARISON:  07/08/2017 FINDINGS: Brain: No acute territorial infarction, hemorrhage or intracranial mass. Marked atrophy. Mild small vessel ischemic changes of the white matter. Vascular: No hyperdense vessels. Vertebral and carotid vascular calcification Skull: Normal. Negative for fracture or focal lesion. Sinuses/Orbits: Mucosal thickening in the maxillary and ethmoid sinuses. Other: None IMPRESSION: 1. No CT evidence for acute intracranial abnormality. 2. Atrophy and small vessel ischemic changes of the white matter Electronically Signed   By: Jasmine Pang M.D.   On: 12/15/2017 18:08    Procedures Procedures (including critical care time)  Medications Ordered in ED Medications  sodium chloride 0.9 % bolus 500 mL (has no administration in time range)     Initial Impression / Assessment and Plan / ED Course  I have reviewed the triage vital signs and the nursing notes.  Pertinent labs & imaging results that were available during my care of the patient were reviewed by me and considered in my medical decision making (see chart for details).  Iv ns bolus. Labs sent.  Reviewed nursing notes and prior charts for additional history.   Labs reviewed - c/w uti, u cx sent, rocephin iv.  Additional iv ns bolus. Ck is elevated, likely from prolonged period on floor. ivf boluses.   Ct reviewed - neg acute.   hospitalists consulted for admission.    Final Clinical Impressions(s) / ED Diagnoses   Final diagnoses:  None    ED Discharge Orders    None       Cathren Laine, MD 12/15/17 [phone]

## 2017-12-15 NOTE — ED Notes (Signed)
Pt cleaned of urine. And vomit. Red area noted at right hip, ion skin fold. 3-4 inchesx 2 inches and 2 inch red strip across both nknees in matching linear pattern. Blanchable red area noted at sacrum.

## 2017-12-16 ENCOUNTER — Encounter (HOSPITAL_COMMUNITY): Payer: Self-pay | Admitting: General Practice

## 2017-12-16 DIAGNOSIS — N3000 Acute cystitis without hematuria: Secondary | ICD-10-CM | POA: Diagnosis not present

## 2017-12-16 DIAGNOSIS — I10 Essential (primary) hypertension: Secondary | ICD-10-CM | POA: Diagnosis not present

## 2017-12-16 DIAGNOSIS — N3001 Acute cystitis with hematuria: Secondary | ICD-10-CM | POA: Diagnosis not present

## 2017-12-16 DIAGNOSIS — W19XXXA Unspecified fall, initial encounter: Secondary | ICD-10-CM | POA: Diagnosis not present

## 2017-12-16 DIAGNOSIS — R296 Repeated falls: Secondary | ICD-10-CM

## 2017-12-16 DIAGNOSIS — N390 Urinary tract infection, site not specified: Secondary | ICD-10-CM

## 2017-12-16 HISTORY — DX: Urinary tract infection, site not specified: N39.0

## 2017-12-16 HISTORY — DX: Repeated falls: R29.6

## 2017-12-16 LAB — COMPREHENSIVE METABOLIC PANEL
ALBUMIN: 2.7 g/dL — AB (ref 3.5–5.0)
ALK PHOS: 61 U/L (ref 38–126)
ALT: 25 U/L (ref 0–44)
ANION GAP: 10 (ref 5–15)
AST: 59 U/L — ABNORMAL HIGH (ref 15–41)
BUN: 40 mg/dL — ABNORMAL HIGH (ref 8–23)
CHLORIDE: 113 mmol/L — AB (ref 98–111)
CO2: 20 mmol/L — AB (ref 22–32)
Calcium: 8.4 mg/dL — ABNORMAL LOW (ref 8.9–10.3)
Creatinine, Ser: 1.48 mg/dL — ABNORMAL HIGH (ref 0.44–1.00)
GFR calc non Af Amer: 30 mL/min — ABNORMAL LOW (ref >60)
GFR, EST AFRICAN AMERICAN: 34 mL/min — AB (ref >60)
GLUCOSE: 89 mg/dL (ref 70–99)
POTASSIUM: 4.2 mmol/L (ref 3.5–5.1)
SODIUM: 143 mmol/L (ref 135–145)
Total Bilirubin: 0.9 mg/dL (ref 0.3–1.2)
Total Protein: 5.9 g/dL — ABNORMAL LOW (ref 6.5–8.1)

## 2017-12-16 LAB — CBC
HEMATOCRIT: 37.7 % (ref 36.0–46.0)
Hemoglobin: 11.3 g/dL — ABNORMAL LOW (ref 12.0–15.0)
MCH: 28.4 pg (ref 26.0–34.0)
MCHC: 30 g/dL (ref 30.0–36.0)
MCV: 94.7 fL (ref 80.0–100.0)
NRBC: 0 % (ref 0.0–0.2)
PLATELETS: 152 10*3/uL (ref 150–400)
RBC: 3.98 MIL/uL (ref 3.87–5.11)
RDW: 13.5 % (ref 11.5–15.5)
WBC: 12.3 10*3/uL — ABNORMAL HIGH (ref 4.0–10.5)

## 2017-12-16 MED ORDER — NYSTATIN 100000 UNIT/GM EX POWD
Freq: Three times a day (TID) | CUTANEOUS | Status: DC
  Administered 2017-12-16: 09:00:00 via TOPICAL
  Filled 2017-12-16: qty 15

## 2017-12-16 MED ORDER — NYSTATIN-TRIAMCINOLONE 100000-0.1 UNIT/GM-% EX CREA
TOPICAL_CREAM | Freq: Two times a day (BID) | CUTANEOUS | Status: DC
  Administered 2017-12-16: 1 via TOPICAL
  Administered 2017-12-16 – 2017-12-18 (×4): via TOPICAL
  Filled 2017-12-16: qty 15

## 2017-12-16 MED ORDER — INFLUENZA VAC SPLIT HIGH-DOSE 0.5 ML IM SUSY
0.5000 mL | PREFILLED_SYRINGE | INTRAMUSCULAR | Status: AC
  Administered 2017-12-17: 0.5 mL via INTRAMUSCULAR
  Filled 2017-12-16: qty 0.5

## 2017-12-16 NOTE — Care Management Obs Status (Signed)
MEDICARE OBSERVATION STATUS NOTIFICATION   Patient Details  Name: [name] MRN: [id number] Date of Birth: Jul 10, 1924   Medicare Observation Status Notification Given:  Yes    Elliot Cousin, RN 12/16/2017, 3:21 PM

## 2017-12-16 NOTE — Care Management CC44 (Signed)
Condition Code 44 Documentation Completed  Patient Details  Name: [name] MRN: [id number] Date of Birth: Oct 01, 1924   Condition Code 44 given:  Yes Patient signature on Condition Code 44 notice:  Yes Documentation of 2 MD's agreement:  Yes Code 44 added to claim:  Yes    Elliot Cousin, RN 12/16/2017, 3:21 PM

## 2017-12-16 NOTE — ED Notes (Signed)
Small stage 2 pressure ulcer noted on sacrum, pink dressing applied.  Rash noted in folds of skin, MD to be paged

## 2017-12-16 NOTE — ED Notes (Signed)
Heart Healthy Diet was ordered for Lunch. 

## 2017-12-16 NOTE — ED Notes (Signed)
Breakfast tray ordered 

## 2017-12-16 NOTE — ED Notes (Signed)
Patient's pastor came to visit and wanted to make [place] aware the patient has no immediate family other than church family and wanted to leave her number for [place] to contact if she could be of help providing information.  [employer] number: [phone] Cell number: [phone]

## 2017-12-16 NOTE — Evaluation (Signed)
Physical Therapy Evaluation Patient Details Name: [name] MRN: [id number] DOB: 12-04-1924 Today's Date: 12/16/2017   History of Present Illness  Pt is a 84 y/o female admitted secondary to fall. Found to have UTI as well. CT of head negative for acute abnormality. PMH includes HTN.   Clinical Impression  Pt admitted secondary to problem above with deficits below. Pt limited secondary to wooziness this session once sitting at EOB. Did have BM X2 and required max A for rolling side to side. Required max A to sit at EOB and return to supine as well. Feel pt is at increased risk for falls and pt currently lives alone. Per pt's pastor, pt has no family and no one to support her at home. Will continue to follow acutely to maximize functional mobility independence and safety.     Follow Up Recommendations SNF;Supervision/Assistance - 24 hour    Equipment Recommendations  None recommended by PT    Recommendations for Other Services       Precautions / Restrictions Precautions Precautions: Fall Restrictions Weight Bearing Restrictions: No      Mobility  Bed Mobility Overal bed mobility: Needs Assistance Bed Mobility: Rolling;Supine to Sit;Sit to Supine Rolling: Max assist   Supine to sit: Max assist Sit to supine: Max assist   General bed mobility comments: Rolled from side to side with max A for clean up following BM. After finishing, pt reports she had to have another BM, so rolled from side to side to clean up again. Pt requiring max A to roll and reports rolling was very hard. Required max A to sit up at EOB following clean up and pt reporting she felt woozy and requesting to lay back down. Max A for trunk descent and LE assist to return to supine.   Transfers                 General transfer comment: Deferred  Ambulation/Gait                Stairs            Wheelchair Mobility    Modified Rankin (Stroke Patients Only)       Balance Overall  balance assessment: Needs assistance Sitting-balance support: No upper extremity supported;Feet unsupported Sitting balance-Leahy Scale: Fair                                       Pertinent Vitals/Pain Pain Assessment: Faces Faces Pain Scale: Hurts little more Pain Location: R hip  Pain Descriptors / Indicators: Grimacing;Guarding Pain Intervention(s): Limited activity within patient's tolerance;Monitored during session;Repositioned    Home Living Family/patient expects to be discharged to:: Private residence Living Arrangements: Alone   Type of Home: Apartment           Additional Comments: Unsure of details of home environment, however, pt's pastor reports pt's home is very cluttered. [name] reports she lives in low income senior housing.     Prior Function Level of Independence: Independent with assistive device(s)         Comments: Reports she used her RW for ambulation.      Hand Dominance        Extremity/Trunk Assessment        Lower Extremity Assessment Lower Extremity Assessment: RLE deficits/detail;Generalized weakness RLE Deficits / Details: R hip bruising noted. Limited ROM secondary to pain.     Cervical / Trunk Assessment  Cervical / Trunk Assessment: Kyphotic  Communication   Communication: No difficulties  Cognition Arousal/Alertness: Awake/alert Behavior During Therapy: WFL for tasks assessed/performed Overall Cognitive Status: No family/caregiver present to determine baseline cognitive functioning                                 General Comments: Pt somewhat confused and very unaware of safety and deficits.       General Comments      Exercises     Assessment/Plan    PT Assessment Patient needs continued PT services  PT Problem List Decreased strength;Decreased balance;Decreased mobility;Decreased activity tolerance;Decreased knowledge of use of DME;Decreased knowledge of precautions;Pain       PT  Treatment Interventions DME instruction;Gait training;Functional mobility training;Therapeutic exercise;Therapeutic activities;Balance training;Patient/family education;Cognitive remediation    PT Goals (Current goals can be found in the Care Plan section)  Acute Rehab PT Goals Patient Stated Goal: to go home PT Goal Formulation: With patient Time For Goal Achievement: 12/30/17 Potential to Achieve Goals: Fair    Frequency Min 2X/week   Barriers to discharge Decreased caregiver support Lives alone     Co-evaluation               AM-PAC PT "6 Clicks" Daily Activity  Outcome Measure Difficulty turning over in bed (including adjusting bedclothes, sheets and blankets)?: Unable Difficulty moving from lying on back to sitting on the side of the bed? : Unable Difficulty sitting down on and standing up from a chair with arms (e.g., wheelchair, bedside commode, etc,.)?: Unable Help needed moving to and from a bed to chair (including a wheelchair)?: A Lot Help needed walking in hospital room?: A Lot Help needed climbing 3-5 steps with a railing? : Total 6 Click Score: 8    End of Session   Activity Tolerance: Treatment limited secondary to medical complications (Comment)("wooziness" ) Patient left: in bed;with call bell/phone within reach;with nursing/sitter in room Nurse Communication: Mobility status PT Visit Diagnosis: Unsteadiness on feet (R26.81);Muscle weakness (generalized) (M62.81);Repeated falls (R29.6);History of falling (Z91.81)    Time: 1610-9604 PT Time Calculation (min) (ACUTE ONLY): 32 min   Charges:   PT Evaluation $PT Eval Moderate Complexity: 1 Mod PT Treatments $Therapeutic Activity: 8-22 mins        Gladys Damme, PT, DPT  Acute Rehabilitation Services  Pager: [phone] Office: [phone]   Lehman Prom 12/16/2017, 3:22 PM

## 2017-12-16 NOTE — Progress Notes (Signed)
Patient Demographics:    [name], is a 84 y.o. female, DOB - [birth year], ZOX:[id number]  Admit date - 12/15/2017   Admitting Physician Rometta Emery, MD  Outpatient Primary MD for the patient is Jarrett Soho, PA-C  LOS - 1   Chief Complaint  Patient presents with  . Fall        Subjective:    [name] today has no fevers, no emesis,  No chest pain, oral intake is fair, no abdominal pain,  Assessment  & Plan :    Principal Problem:   Mechanical Fall Active Problems:   UTI (urinary tract infection)   ARF (acute renal failure) (HCC)   Hypertension   Hypothyroidism   Hyperlipidemia  Brief Summary   84 y.o. female with medical history significant of hypertension, carotid artery occlusion, hypothyroidism admitted on 12/15/2017 after a mechanical fall at home with concerns about concomitant UTI  Plan:- 1)Generalized weakness/Recurrent Falls-  unsteadiness , patient has significant difficulties with ADLs especially mobility related activities of daily living , no new focal deficits, no tremors, physical therapy eval appreciated to recommend skilled nursing facility rehab prior to returning home, patient lives at home alone with no help  2)UTI--- presumed UTI, white count is down to 12.3 from 16.6, continue IV Rocephin pending final culture results  3)AKI----acute kidney injury on CKD stage - III to IV due to dehydration, creatinine on admission= 1.78  ,   baseline creatinine = 1.2 to 1.3   , creatinine is now=1.48      , renally adjust medications, avoid nephrotoxic agents/dehydration/hypotension, hold lisinopril,Dehydration and renal function improved after IV fluid boluses, may decrease IV fluids to 40 mL an hour until oral intake is more reliable  4)HTN--- stable, atenolol 25 mg daily, hold lisinopril due to kidney concerns  5)Hypothyroidism--- stable, continue levothyroxine 50 mcg  daily  6)Social/Ethics--- patient is alert, oriented, she is indicating that she may refuse SNF placement and rather go home, she appears coherent enough to make decisions however she is very high risk for fall and self injury should she return home  7)Small stage 2 pressure ulcer noted on sacrum and Candida intertrigo--- wound care as advised   Disposition/Need for in-Hospital Stay- patient unable to be discharged at this time due to acute kidney injury/dehydration requiring IV fluids, and need for SNF placement pending insurance approval  Code Status : Full  Disposition Plan  : SNF   Consults  :  PT/SW/CM   DVT Prophylaxis  :   Heparin   Lab Results  Component Value Date   PLT 152 12/16/2017    Inpatient Medications  Scheduled Meds: . aspirin  81 mg Oral Daily  . atenolol  25 mg Oral Daily  . atorvastatin  20 mg Oral Daily  . heparin  5,000 Units Subcutaneous Q8H  . [START ON 12/17/2017] Influenza vac split quadrivalent PF  0.5 mL Intramuscular Tomorrow-1000  . levothyroxine  50 mcg Oral QAC breakfast  . nystatin-triamcinolone   Topical BID   Continuous Infusions: . sodium chloride 40 mL/hr at 12/16/17 1539  . cefTRIAXone (ROCEPHIN)  IV Stopped (12/16/17 0057)   PRN Meds:.ondansetron **OR** ondansetron (ZOFRAN) IV    Anti-infectives (From admission, onward)   Start  Dose/Rate Route Frequency Ordered Stop   12/15/17 2345  cefTRIAXone (ROCEPHIN) 1 g in sodium chloride 0.9 % 100 mL IVPB     1 g 200 mL/hr over 30 Minutes Intravenous Every 24 hours 12/15/17 2341     12/15/17 1800  cefTRIAXone (ROCEPHIN) 1 g in sodium chloride 0.9 % 100 mL IVPB     1 g 200 mL/hr over 30 Minutes Intravenous  Once 12/15/17 1746 12/15/17 1905        Objective:   Vitals:   12/15/17 1915 12/16/17 0218 12/16/17 0603 12/16/17 1359  BP: (!) 127/39 131/63 (!) 153/52 (!) 138/52  Pulse: 77 80 82 79  Resp: 20 18 16 18   Temp:  98.5 F (36.9 C) 98.1 F (36.7 C) 98.6 F (37 C)    TempSrc:  Oral Oral Oral  SpO2: 95% 95% 96% 100%  Weight:      Height:        Wt Readings from Last 3 Encounters:  12/15/17 62.1 kg  08/24/17 60.8 kg  07/28/16 55.8 kg     Intake/Output Summary (Last 24 hours) at 12/16/2017 1618 Last data filed at 12/16/2017 1412 Gross per 24 hour  Intake 1101.21 ml  Output -  Net 1101.21 ml    Physical Exam Patient is examined daily including today on 12/16/17 , exams remain the same as of yesterday except that has changed   Gen:- Awake Alert,  In no apparent distress  HEENT:- [place].AT, No sclera icterus Neck-Supple Neck,No JVD,.  Lungs-  CTAB , fair air movement CV- S1, S2 normal, regular Abd-  +ve B.Sounds, Abd Soft, No tenderness,    Extremity/Skin:- No  edema, good pulses,  Small stage 2 pressure ulcer noted on sacrum, inguinal folds with Candida intertrigo type findings, Psych-affect is appropriate, oriented x3 Neuro-generalized weakness, unsteadiness , patient has significant difficulties with ADLs especially mobility related activities of daily living , no new focal deficits, no tremors   Data Review:   Micro Results No results found for this or any previous visit (from the past 240 hour(s)).  Radiology Reports Ct Head Wo Contrast  Result Date: 12/15/2017 CLINICAL DATA:  Found on floor EXAM: CT HEAD WITHOUT CONTRAST TECHNIQUE: Contiguous axial images were obtained from the base of the skull through the vertex without intravenous contrast. COMPARISON:  07/08/2017 FINDINGS: Brain: No acute territorial infarction, hemorrhage or intracranial mass. Marked atrophy. Mild small vessel ischemic changes of the white matter. Vascular: No hyperdense vessels. Vertebral and carotid vascular calcification Skull: Normal. Negative for fracture or focal lesion. Sinuses/Orbits: Mucosal thickening in the maxillary and ethmoid sinuses. Other: None IMPRESSION: 1. No CT evidence for acute intracranial abnormality. 2. Atrophy and small vessel ischemic  changes of the white matter Electronically Signed   By: Jasmine Pang M.D.   On: 12/15/2017 18:08     CBC Recent Labs  Lab 12/15/17 1540 12/16/17 0246  WBC 16.6* 12.3*  HGB 13.7 11.3*  HCT 43.3 37.7  PLT 180 152  MCV 93.1 94.7  MCH 29.5 28.4  MCHC 31.6 30.0  RDW 13.4 13.5    Chemistries  Recent Labs  Lab 12/15/17 1540 12/16/17 0246  NA 141 143  K 4.5 4.2  CL 107 113*  CO2 25 20*  GLUCOSE 108* 89  BUN 41* 40*  CREATININE 1.78* 1.48*  CALCIUM 9.5 8.4*  AST 74* 59*  ALT 26 25  ALKPHOS 75 61  BILITOT 1.1 0.9   ------------------------------------------------------------------------------------------------------------------ No results for input(s): CHOL, HDL, LDLCALC, TRIG, CHOLHDL, LDLDIRECT  in the last 72 hours.  No results found for: HGBA1C ------------------------------------------------------------------------------------------------------------------ No results for input(s): TSH, T4TOTAL, T3FREE, THYROIDAB in the last 72 hours.  Invalid input(s): FREET3 ------------------------------------------------------------------------------------------------------------------ No results for input(s): VITAMINB12, FOLATE, FERRITIN, TIBC, IRON, RETICCTPCT in the last 72 hours.  Coagulation profile No results for input(s): INR, PROTIME in the last 168 hours.  No results for input(s): DDIMER in the last 72 hours.  Cardiac Enzymes No results for input(s): CKMB, TROPONINI, MYOGLOBIN in the last 168 hours.  Invalid input(s): CK ------------------------------------------------------------------------------------------------------------------ No results found for: BNP   Shon Hale M.D on 12/16/2017 at 4:18 PM  Pager---[phone] Go to [email or web address] - password [email or web address] for contact info  [facility] - Office  [phone]

## 2017-12-16 NOTE — Progress Notes (Addendum)
Patient trasfered from ED to [phone] via stretcher; alert and oriented x 4, confused at times; no complaints of pain; IV in RFA running fluids at 100cc/hr; Orient patient to room and unit; gave patient care guide; instructed how to use the call bell and  fall risk precautions. Will continue to monitor the patient.

## 2017-12-16 NOTE — Care Management Note (Addendum)
Case Management Note  Patient Details  Name: [name] MRN: [id number] Date of Birth: [birth year]  Subjective/Objective:     fall               Action/Plan: Spoke to pt and she lives alone. Her support is limited. She has RW at home. Discussed with pt Medical Alert system. States she plans to look into getting a medical alert system. States her income is limited. She has a neighbor that assist with getting to appt. Discussed going to SNF for rehab. She has declined. Explained PT will come in and evaluation. Offered choice for HH/list provided. Pt requested [facility]. Contacted [facility] for 3n1 for home. CSW referral for transportation, will need taxi voucher. Contacted [facility] for new referral. Waiting PT recommendation. Notified attending for [facility], PT, OT, aide an SW orders with [facility] if pt dc home.    Spoke to pt's friend, Jeanmarie Hubert # [phone]. Friend will come to visit her tomorrow. She maybe able to provide.      Expected Discharge Date:              Expected Discharge Plan:  Home w Home Health Services  In-House Referral:  Clinical Social Work  Discharge planning Services  CM Consult  Post Acute Care Choice:  Home Health Choice offered to:  Patient  DME Arranged:  3-N-1 DME Agency:  [facility]  HH Arranged:  PT, RN, Nurse's Aide, OT, Social Work [employer] Agency:  [facility]  Status of Service:  Completed, signed off  If discussed at [employer], dates discussed:    Additional Comments:  Elliot Cousin, RN 12/16/2017, 3:08 PM

## 2017-12-17 DIAGNOSIS — N3000 Acute cystitis without hematuria: Secondary | ICD-10-CM | POA: Diagnosis not present

## 2017-12-17 DIAGNOSIS — I10 Essential (primary) hypertension: Secondary | ICD-10-CM | POA: Diagnosis not present

## 2017-12-17 DIAGNOSIS — N3001 Acute cystitis with hematuria: Secondary | ICD-10-CM | POA: Diagnosis not present

## 2017-12-17 DIAGNOSIS — W19XXXA Unspecified fall, initial encounter: Secondary | ICD-10-CM | POA: Diagnosis not present

## 2017-12-17 LAB — BASIC METABOLIC PANEL
ANION GAP: 7 (ref 5–15)
BUN: 31 mg/dL — ABNORMAL HIGH (ref 8–23)
CALCIUM: 8.2 mg/dL — AB (ref 8.9–10.3)
CO2: 20 mmol/L — ABNORMAL LOW (ref 22–32)
CREATININE: 1.12 mg/dL — AB (ref 0.44–1.00)
Chloride: 116 mmol/L — ABNORMAL HIGH (ref 98–111)
GFR calc Af Amer: 48 mL/min — ABNORMAL LOW (ref >60)
GFR, EST NON AFRICAN AMERICAN: 42 mL/min — AB (ref >60)
Glucose, Bld: 91 mg/dL (ref 70–99)
Potassium: 4 mmol/L (ref 3.5–5.1)
SODIUM: 143 mmol/L (ref 135–145)

## 2017-12-17 LAB — CBC
HCT: 33.3 % — ABNORMAL LOW (ref 36.0–46.0)
Hemoglobin: 10.4 g/dL — ABNORMAL LOW (ref 12.0–15.0)
MCH: 29.2 pg (ref 26.0–34.0)
MCHC: 31.2 g/dL (ref 30.0–36.0)
MCV: 93.5 fL (ref 80.0–100.0)
PLATELETS: 127 10*3/uL — AB (ref 150–400)
RBC: 3.56 MIL/uL — ABNORMAL LOW (ref 3.87–5.11)
RDW: 13.2 % (ref 11.5–15.5)
WBC: 8.1 10*3/uL (ref 4.0–10.5)
nRBC: 0 % (ref 0.0–0.2)

## 2017-12-17 NOTE — Progress Notes (Signed)
Patient does not have insurance approval to go to [facility] yet. CSW will follow up tomorrow.  Osborne Casco Vergil Burby LCSW [phone]

## 2017-12-17 NOTE — Clinical Social Work Note (Signed)
Clinical Social Work Assessment  Patient Details  Name: [name] MRN: [id number] Date of Birth: [birth year]  Date of referral:  12/17/17               Reason for consult:  Facility Placement                Permission sought to share information with:  Facility Medical sales representative, Family Supports Permission granted to share information::  Yes, Verbal Permission Granted  Name::     Cecille Po  Agency::  SNFs  Relationship::  [place] Main Information:  [phone]  Housing/Transportation Living arrangements for the past 2 months:  Apartment Source of Information:  Patient, Chaplain Patient Interpreter Needed:  None Criminal Activity/Legal Involvement Pertinent to Current Situation/Hospitalization:  No - Comment as needed Significant Relationships:  Friend Lives with:  Self Do you feel safe going back to the place where you live?  No Need for family participation in patient care:  Yes (Comment)  Care giving concerns:  CSW received consult for possible SNF placement at time of discharge. CSW spoke with patient and her [name] regarding PT recommendation of SNF placement at time of discharge. Patient lives alone and has no one to help her. Her church is her only family. Patient expressed understanding of PT recommendation and is agreeable to SNF placement at time of discharge. CSW to continue to follow and assist with discharge planning needs.   Social Worker assessment / plan:  CSW spoke with patient concerning possibility of rehab at [facility] before returning home.  Employment status:  Retired Database administrator PT Recommendations:  Skilled Nursing Facility Information / Referral to community resources:  Skilled Nursing Facility  Patient/Family's Response to care:  Patient recognizes need for rehab before returning home and is agreeable to a SNF in [place]. Patient reported preference for [facility].  Patient/Family's Understanding of and  Emotional Response to Diagnosis, Current Treatment, and Prognosis:  Patient/family is realistic regarding therapy needs and expressed being hopeful for SNF placement. Patient expressed understanding of CSW role and discharge process as well as medical condition. No questions/concerns about plan or treatment.    Emotional Assessment Appearance:  Appears stated age Attitude/Demeanor/Rapport:  Gracious Affect (typically observed):  Accepting, Appropriate Orientation:  Oriented to Self, Oriented to Place, Oriented to  Time, Oriented to Situation Alcohol / Substance use:  Not Applicable Psych involvement (Current and /or in the community):  No (Comment)  Discharge Needs  Concerns to be addressed:  Care Coordination Readmission within the last 30 days:  No Current discharge risk:  Lives alone Barriers to Discharge:  No Barriers Identified   Mearl Latin, LCSW 12/17/2017, 10:20 AM

## 2017-12-17 NOTE — NC FL2 (Signed)
[place] MEDICAID FL2 LEVEL OF CARE SCREENING TOOL     IDENTIFICATION  Patient Name: [name] Birthdate: [birth year] Sex: female Admission Date (Current Location): 12/15/2017  [facility] and [place] Number:  Producer, television/film/video and Address:  [facility], [address], [place], [place] [address]      Provider Number: [id number]  Attending Physician Name and Address:  Shon Hale, MD  Relative Name and Phone Number:       Current Level of Care: Hospital Recommended Level of Care: Skilled Nursing Facility Prior Approval Number:    Date Approved/Denied:   PASRR Number: [id number]  Discharge Plan: SNF    Current Diagnoses: Patient Active Problem List   Diagnosis Date Noted  . Mechanical Fall 12/15/2017  . UTI (urinary tract infection) 12/15/2017  . ARF (acute renal failure) (HCC) 12/15/2017  . Hypertension 12/15/2017  . Hypothyroidism 12/15/2017  . Hyperlipidemia 12/15/2017  . Occlusion and stenosis of carotid artery without mention of cerebral infarction 06/09/2011    Orientation RESPIRATION BLADDER Height & Weight     Self, Time, Situation, Place  Normal Continent, External catheter Weight: 62.1 kg Height:  5\' 1"  (154.9 cm)  BEHAVIORAL SYMPTOMS/MOOD NEUROLOGICAL BOWEL NUTRITION STATUS      Continent Diet(Please see DC Summary)  AMBULATORY STATUS COMMUNICATION OF NEEDS Skin   Extensive Assist Verbally Normal                       Personal Care Assistance Level of Assistance  Bathing, Feeding, Dressing Bathing Assistance: Maximum assistance Feeding assistance: Limited assistance Dressing Assistance: Limited assistance     Functional Limitations Info  Sight, Hearing, Speech Sight Info: Adequate Hearing Info: Adequate Speech Info: Adequate    SPECIAL CARE FACTORS FREQUENCY  PT (By licensed PT), OT (By licensed OT)     PT Frequency: 5x/week OT Frequency: 3x/week            Contractures Contractures Info: Not  present    Additional Factors Info  Code Status, Allergies Code Status Info: Full Allergies Info: nka           Current Medications (12/17/2017):  This is the current hospital active medication list Current Facility-Administered Medications  Medication Dose Route Frequency Provider Last Rate Last Dose  . 0.9 %  sodium chloride infusion   Intravenous Continuous Emokpae, Courage, MD 40 mL/hr at 12/17/17 0803 1,000 mL at 12/17/17 0803  . aspirin chewable tablet 81 mg  81 mg Oral Daily Rometta Emery, MD   81 mg at 12/16/17 0845  . atenolol (TENORMIN) tablet 25 mg  25 mg Oral Daily Rometta Emery, MD   25 mg at 12/16/17 0846  . atorvastatin (LIPITOR) tablet 20 mg  20 mg Oral Daily Rometta Emery, MD   20 mg at 12/16/17 0846  . cefTRIAXone (ROCEPHIN) 1 g in sodium chloride 0.9 % 100 mL IVPB  1 g Intravenous Q24H Earlie Lou L, MD 200 mL/hr at 12/16/17 2347 1 g at 12/16/17 2347  . heparin injection 5,000 Units  5,000 Units Subcutaneous Q8H Rometta Emery, MD   5,000 Units at 12/17/17 0602  . Influenza vac split quadrivalent PF (FLUZONE HIGH-DOSE) injection 0.5 mL  0.5 mL Intramuscular Tomorrow-1000 Emokpae, Courage, MD      . levothyroxine (SYNTHROID, LEVOTHROID) tablet 50 mcg  50 mcg Oral QAC breakfast Rometta Emery, MD   50 mcg at 12/17/17 0801  . nystatin-triamcinolone (MYCOLOG II) cream   Topical  BID Shon Hale, MD   1 application at 12/16/17 2208  . ondansetron (ZOFRAN) tablet 4 mg  4 mg Oral Q6H PRN Rometta Emery, MD       Or  . ondansetron (ZOFRAN) injection 4 mg  4 mg Intravenous Q6H PRN Rometta Emery, MD         Discharge Medications: Please see discharge summary for a list of discharge medications.  Relevant Imaging Results:  Relevant Lab Results:   Additional Information SSN: [id number] [place], [place]

## 2017-12-17 NOTE — Progress Notes (Signed)
Physical Therapy Treatment Patient Details Name: [name] MRN: [id number] DOB: [birth year] Today's Date: 12/17/2017    History of Present Illness Pt is a 84 y/o female admitted secondary to fall. Found to have UTI as well. CT of head negative for acute abnormality. PMH includes HTN.     PT Comments    Pt performed transfer training and functional mobility during session this afternoon.  She remains limited based on strength deficits and incontinence of bowel and bladder in standing.  Continue to recommend SNF placement at this time based on functional deficits.      Follow Up Recommendations  SNF;Supervision/Assistance - 24 hour     Equipment Recommendations  None recommended by PT    Recommendations for Other Services       Precautions / Restrictions Precautions Precautions: Fall Restrictions Weight Bearing Restrictions: No    Mobility  Bed Mobility               General bed mobility comments: Pt in recliner on arrival.    Transfers Overall transfer level: Needs assistance Equipment used: Rolling walker (2 wheeled);Ambulation equipment used(sara stedy used for transfers after unable to stand upright with RW.  ) Transfers: Sit to/from Stand Sit to Stand: Mod assist;+2 physical assistance         General transfer comment: Pt performed standing x3 standing trials.  Pt able to stand more upright with use of sara stedy.  Asssistance to boost into standing and mod +1 to maintain standing.    Ambulation/Gait Ambulation/Gait assistance: (NT)               Stairs             Wheelchair Mobility    Modified Rankin (Stroke Patients Only)       Balance Overall balance assessment: Needs assistance Sitting-balance support: No upper extremity supported;Feet unsupported Sitting balance-Leahy Scale: Fair       Standing balance-Leahy Scale: Poor                              Cognition Arousal/Alertness: Awake/alert Behavior During  Therapy: WFL for tasks assessed/performed Overall Cognitive Status: No family/caregiver present to determine baseline cognitive functioning                                 General Comments: Pt somewhat confused and very unaware of safety and deficits.       Exercises      General Comments        Pertinent Vitals/Pain Pain Assessment: 0-10 Pain Score: 0-No pain Faces Pain Scale: Hurts little more Pain Location: R hip  Pain Descriptors / Indicators: Grimacing;Guarding Pain Intervention(s): Limited activity within patient's tolerance;Monitored during session;Repositioned    Home Living Family/patient expects to be discharged to:: Unsure Living Arrangements: Alone   Type of Home: Apartment         Additional Comments: Unsure of details of home environment, however, pt's pastor reports pt's home is very cluttered. [name] reports she lives in low income senior housing. Pt reports she has a neighbor that checks on her daily.  Pt also reports that recently she has begun washing at sink as she feels unsafe in the shower.    Prior Function Level of Independence: Independent with assistive device(s)      Comments: Reports she used her RW for ambulation.    PT Goals (current  goals can now be found in the care plan section) Acute Rehab PT Goals Patient Stated Goal: to go home Potential to Achieve Goals: Fair Progress towards PT goals: Progressing toward goals    Frequency    Min 2X/week      PT Plan Current plan remains appropriate    Co-evaluation              AM-PAC PT "6 Clicks" Daily Activity  Outcome Measure  Difficulty turning over in bed (including adjusting bedclothes, sheets and blankets)?: Unable Difficulty moving from lying on back to sitting on the side of the bed? : Unable Difficulty sitting down on and standing up from a chair with arms (e.g., wheelchair, bedside commode, etc,.)?: Unable Help needed moving to and from a bed to chair  (including a wheelchair)?: A Lot Help needed walking in hospital room?: A Lot Help needed climbing 3-5 steps with a railing? : Total 6 Click Score: 8    End of Session Equipment Utilized During Treatment: Gait belt Activity Tolerance: Treatment limited secondary to medical complications (Comment)(Pt with urinary and bowel incontinence in standing.  ) Patient left: in bed;with call bell/phone within reach;with nursing/sitter in room Nurse Communication: Mobility status PT Visit Diagnosis: Unsteadiness on feet (R26.81);Muscle weakness (generalized) (M62.81);Repeated falls (R29.6);History of falling (Z91.81)     Time: 4098-1191 PT Time Calculation (min) (ACUTE ONLY): 31 min  Charges:  $Therapeutic Activity: 23-37 mins                     Joycelyn Rua, PTA Acute Rehabilitation Services Pager [phone] Office [phone]     [name] Artis Delay 12/17/2017, 5:02 PM

## 2017-12-17 NOTE — Progress Notes (Signed)
Patient Demographics:    [name], is a 84 y.o. female, DOB - 13-Apr-1924, ZOX:[id number]  Admit date - 12/15/2017   Admitting Physician Rometta Emery, MD  Outpatient Primary MD for the patient is Jarrett Soho, PA-C  LOS - 1   Chief Complaint  Patient presents with  . Fall        Subjective:    Mellody Life today has no fevers, no emesis,  No chest pain, complains of generalized weakness and debility  Assessment  & Plan :    Principal Problem:   Mechanical Fall Active Problems:   UTI (urinary tract infection)   ARF (acute renal failure) (HCC)   Hypertension   Hypothyroidism   Hyperlipidemia  Brief Summary   84 y.o. female with medical history significant of hypertension, carotid artery occlusion, hypothyroidism admitted on 12/15/2017 after a mechanical fall at home with concerns about concomitant UTI  Plan:- 1)Generalized weakness/Recurrent Falls-  unsteadiness , patient has significant difficulties with ADLs, especially mobility related activities of daily living , no new focal deficits, no tremors, physical therapy eval appreciated, they recommend skilled nursing facility rehab prior to returning home, patient lives at home alone with no help  2)E coli UTI--- clinically improving, continue IV Rocephin pending sensitivities of E. coli urine,  white count is down to 8.1 from  16.6 on admission,   3)AKI----acute kidney injury on CKD stage - III to IV due to dehydration, creatinine on admission= 1.78  ,   baseline creatinine = 1.2 to 1.3   , creatinine is now=1.1  , renally adjust medications, avoid nephrotoxic agents/dehydration/hypotension, continue to hold Lisinopril,Dehydration and renal function improved after IV fluid boluses, may decrease IV fluids to 30 mL an hour , encourage increased oral intake  4)HTN--- stable, atenolol 25 mg daily, c/n to hold lisinopril due to kidney  concerns  5)Hypothyroidism--- stable, continue levothyroxine 50 mcg daily  6)Social/Ethics--- patient is alert, oriented, she is agreeable to SNF rehab prior to returning home, she is very high risk for fall and self injury should she return home  7)Small stage 2 pressure ulcer noted on sacrum and Candida intertrigo--- wound care as advised, Mycolog ointment as ordered   Disposition/Need for in-Hospital Stay- patient unable to be discharged at this time due need for SNF rehab, awaiting insurance approval for SNF rehab   Code Status : Full  Disposition Plan  : SNF   Consults  :  PT/SW/CM  DVT Prophylaxis  :   Heparin   Lab Results  Component Value Date   PLT 127 (L) 12/17/2017    Inpatient Medications  Scheduled Meds: . aspirin  81 mg Oral Daily  . atenolol  25 mg Oral Daily  . atorvastatin  20 mg Oral Daily  . heparin  5,000 Units Subcutaneous Q8H  . levothyroxine  50 mcg Oral QAC breakfast  . nystatin-triamcinolone   Topical BID   Continuous Infusions: . sodium chloride 1,000 mL (12/17/17 0803)  . cefTRIAXone (ROCEPHIN)  IV 1 g (12/16/17 2347)   PRN Meds:.ondansetron **OR** ondansetron (ZOFRAN) IV    Anti-infectives (From admission, onward)   Start     Dose/Rate Route Frequency Ordered Stop   12/15/17 2345  cefTRIAXone (ROCEPHIN) 1 g in sodium chloride 0.9 %  100 mL IVPB     1 g 200 mL/hr over 30 Minutes Intravenous Every 24 hours 12/15/17 2341     12/15/17 1800  cefTRIAXone (ROCEPHIN) 1 g in sodium chloride 0.9 % 100 mL IVPB     1 g 200 mL/hr over 30 Minutes Intravenous  Once 12/15/17 1746 12/15/17 1905        Objective:   Vitals:   12/16/17 0603 12/16/17 1359 12/16/17 2225 12/17/17 0621  BP: (!) 153/52 (!) 138/52 (!) 160/70 (!) 154/59  Pulse: 82 79 79 66  Resp: 16 18 18    Temp: 98.1 F (36.7 C) 98.6 F (37 C) 98.9 F (37.2 C) 98.6 F (37 C)  TempSrc: Oral Oral  Oral  SpO2: 96% 100% 99% 97%  Weight:      Height:        Wt Readings from Last 3  Encounters:  12/15/17 62.1 kg  08/24/17 60.8 kg  07/28/16 55.8 kg     Intake/Output Summary (Last 24 hours) at 12/17/2017 1216 Last data filed at 12/17/2017 1001 Gross per 24 hour  Intake 505.19 ml  Output 400 ml  Net 105.19 ml    Physical Exam Patient is examined daily including today on 12/17/17 , exams remain the same as of yesterday except that has changed   Gen:- Awake Alert,  In no apparent distress  HEENT:- [place].AT, No sclera icterus Neck-Supple Neck,No JVD,.  Lungs-  CTAB , fair air movement CV- S1, S2 normal, regular Abd-  +ve B.Sounds, Abd Soft, No tenderness,    Extremity/Skin:- No  edema, good pulses,  Small stage 2 pressure ulcer noted on sacrum, inguinal folds with Candida intertrigo type findings, Psych-affect is appropriate, oriented x3 Neuro-generalized weakness, unsteadiness , patient has significant difficulties with ADLs especially mobility related activities of daily living , no new focal deficits, no tremors   Data Review:   Micro Results Recent Results (from the past 240 hour(s))  Urine Culture     Status: Abnormal (Preliminary result)   Collection Time: 12/15/17  5:47 PM  Result Value Ref Range Status   Specimen Description URINE, RANDOM  Final   Special Requests NONE  Final   Culture (A)  Final    >=100,000 COLONIES/mL ESCHERICHIA COLI SUSCEPTIBILITIES TO FOLLOW Performed at [facility], [address] [place], [place] [address]    Report Status PENDING  Incomplete    Radiology Reports Ct Head Wo Contrast  Result Date: 12/15/2017 CLINICAL DATA:  Found on floor EXAM: CT HEAD WITHOUT CONTRAST TECHNIQUE: Contiguous axial images were obtained from the base of the skull through the vertex without intravenous contrast. COMPARISON:  07/08/2017 FINDINGS: Brain: No acute territorial infarction, hemorrhage or intracranial mass. Marked atrophy. Mild small vessel ischemic changes of the white matter. Vascular: No hyperdense vessels. Vertebral and  carotid vascular calcification Skull: Normal. Negative for fracture or focal lesion. Sinuses/Orbits: Mucosal thickening in the maxillary and ethmoid sinuses. Other: None IMPRESSION: 1. No CT evidence for acute intracranial abnormality. 2. Atrophy and small vessel ischemic changes of the white matter Electronically Signed   By: Jasmine Pang M.D.   On: 12/15/2017 18:08     CBC Recent Labs  Lab 12/15/17 1540 12/16/17 0246 12/17/17 0330  WBC 16.6* 12.3* 8.1  HGB 13.7 11.3* 10.4*  HCT 43.3 37.7 33.3*  PLT 180 152 127*  MCV 93.1 94.7 93.5  MCH 29.5 28.4 29.2  MCHC 31.6 30.0 31.2  RDW 13.4 13.5 13.2    Chemistries  Recent Labs  Lab 12/15/17 1540 12/16/17 0246 12/17/17 0330  NA 141 143 143  K 4.5 4.2 4.0  CL 107 113* 116*  CO2 25 20* 20*  GLUCOSE 108* 89 91  BUN 41* 40* 31*  CREATININE 1.78* 1.48* 1.12*  CALCIUM 9.5 8.4* 8.2*  AST 74* 59*  --   ALT 26 25  --   ALKPHOS 75 61  --   BILITOT 1.1 0.9  --    ------------------------------------------------------------------------------------------------------------------ No results for input(s): CHOL, HDL, LDLCALC, TRIG, CHOLHDL, LDLDIRECT in the last 72 hours.  No results found for: HGBA1C ------------------------------------------------------------------------------------------------------------------ No results for input(s): TSH, T4TOTAL, T3FREE, THYROIDAB in the last 72 hours.  Invalid input(s): FREET3 ------------------------------------------------------------------------------------------------------------------ No results for input(s): VITAMINB12, FOLATE, FERRITIN, TIBC, IRON, RETICCTPCT in the last 72 hours.  Coagulation profile No results for input(s): INR, PROTIME in the last 168 hours.  No results for input(s): DDIMER in the last 72 hours.  Cardiac Enzymes No results for input(s): CKMB, TROPONINI, MYOGLOBIN in the last 168 hours.  Invalid input(s):  CK ------------------------------------------------------------------------------------------------------------------ No results found for: BNP   Shon Hale M.D on 12/17/2017 at 12:16 PM  Pager---[phone] Go to [email or web address] - password [email or web address] for contact info  [facility] - Office  [phone]

## 2017-12-17 NOTE — Evaluation (Signed)
Occupational Therapy Evaluation Patient Details Name: [name] MRN: [id number] DOB: [birth year] Today's Date: 12/17/2017    History of Present Illness Pt is a 84 y/o female admitted secondary to fall. Found to have UTI as well. CT of head negative for acute abnormality. PMH includes HTN.    Clinical Impression   Pt admitted with above and presents to OT with deficits impacting ability to complete ADLs at [facility].  Pt limited with mobility secondary to pain in Rt hip and reports of tingling in BLE.  Engaged in sit > stand with max assist with pt reporting increased pain and wooziness once upright.  Assisted with setup for lunch tray to open containers and cut meat.  Pt will benefit from OT acutely to increase independence with functional mobility and self-care tasks    Follow Up Recommendations  SNF;Supervision/Assistance - 24 hour    Equipment Recommendations    TBD      Precautions / Restrictions Precautions Precautions: Fall      Mobility  Transfers Overall transfer level: Needs assistance   Transfers: Sit to/from Stand Sit to Stand: Max assist                  ADL either performed or assessed with clinical judgement   ADL Overall ADL's : Needs assistance/impaired Eating/Feeding: Set up;Sitting Eating/Feeding Details (indicate cue type and reason): required assist to cut meat     Upper Body Bathing: Minimal assistance;Sitting   Lower Body Bathing: Maximal assistance;Sit to/from stand   Upper Body Dressing : Minimal assistance   Lower Body Dressing: Maximal assistance;Sit to/from stand               Functional mobility during ADLs: Maximal assistance;+2 for physical assistance General ADL Comments: Max assist sit > stand with pt reporting increased pain in Rt hip with mobility.  Pt deferred any transfers this session.     Vision Baseline Vision/History: (reports her vision is "going") Vision Assessment?: Vision impaired- to be further tested in  functional context Additional Comments: limited vision at baseline            Pertinent Vitals/Pain Pain Assessment: Faces Faces Pain Scale: Hurts little more Pain Location: R hip  Pain Descriptors / Indicators: Grimacing;Guarding Pain Intervention(s): Limited activity within patient's tolerance;Monitored during session;Repositioned     Hand Dominance Right   Extremity/Trunk Assessment Upper Extremity Assessment Upper Extremity Assessment: Generalized weakness(BUE shoulder flexion limited to 60*)       Cervical / Trunk Assessment Cervical / Trunk Assessment: Kyphotic   Communication Communication Communication: No difficulties   Cognition Arousal/Alertness: Awake/alert Behavior During Therapy: WFL for tasks assessed/performed Overall Cognitive Status: No family/caregiver present to determine baseline cognitive functioning                                 General Comments: Pt somewhat confused and very unaware of safety and deficits.               Home Living Family/patient expects to be discharged to:: Unsure Living Arrangements: Alone   Type of Home: Apartment                           Additional Comments: Unsure of details of home environment, however, pt's pastor reports pt's home is very cluttered. [name] reports she lives in low income senior housing. Pt reports she has a neighbor that checks on her  daily.  Pt also reports that recently she has begun washing at sink as she feels unsafe in the shower.      Prior Functioning/Environment Level of Independence: Independent with assistive device(s)        Comments: Reports she used her RW for ambulation.         OT Problem List: Decreased strength;Decreased range of motion;Decreased activity tolerance;Impaired balance (sitting and/or standing);Impaired vision/perception;Decreased safety awareness;Pain      OT Treatment/Interventions: Self-care/ADL training;DME and/or AE  instruction;Therapeutic activities;Patient/family education    OT Goals(Current goals can be found in the care plan section) Acute Rehab OT Goals Patient Stated Goal: to go home OT Goal Formulation: With patient Time For Goal Achievement: 12/31/17 Potential to Achieve Goals: Fair  OT Frequency: Min 2X/week   Barriers to D/C: Decreased caregiver support  Pt lives alone and per pastor pt's home is cluttered.          AM-PAC PT "6 Clicks" Daily Activity     Outcome Measure Help from another person eating meals?: A Little Help from another person taking care of personal grooming?: A Little Help from another person toileting, which includes using toliet, bedpan, or urinal?: Total Help from another person bathing (including washing, rinsing, drying)?: Total Help from another person to put on and taking off regular upper body clothing?: A Lot Help from another person to put on and taking off regular lower body clothing?: Total 6 Click Score: 11   End of Session Equipment Utilized During Treatment: Gait belt Nurse Communication: Mobility status  Activity Tolerance: Patient limited by pain Patient left: in chair;with call bell/phone within reach;with chair alarm set  OT Visit Diagnosis: Unsteadiness on feet (R26.81);Repeated falls (R29.6);Muscle weakness (generalized) (M62.81);History of falling (Z91.81);Low vision, both eyes (H54.2);Pain Pain - Right/Left: Right Pain - part of body: Hip                Time: 1191-4782 OT Time Calculation (min): 24 min Charges:  OT General Charges $OT Visit: 1 Visit OT Evaluation $OT Eval Moderate Complexity: 1 Mod OT Treatments $Self Care/Home Management : 8-22 mins   Rosalio Loud, [phone] 12/17/2017, 3:28 PM

## 2017-12-18 DIAGNOSIS — Z9181 History of falling: Secondary | ICD-10-CM | POA: Diagnosis not present

## 2017-12-18 DIAGNOSIS — Z7401 Bed confinement status: Secondary | ICD-10-CM | POA: Diagnosis not present

## 2017-12-18 DIAGNOSIS — R2681 Unsteadiness on feet: Secondary | ICD-10-CM | POA: Diagnosis not present

## 2017-12-18 DIAGNOSIS — Z823 Family history of stroke: Secondary | ICD-10-CM | POA: Diagnosis not present

## 2017-12-18 DIAGNOSIS — Z9841 Cataract extraction status, right eye: Secondary | ICD-10-CM | POA: Diagnosis not present

## 2017-12-18 DIAGNOSIS — M6281 Muscle weakness (generalized): Secondary | ICD-10-CM | POA: Diagnosis not present

## 2017-12-18 DIAGNOSIS — H3530 Unspecified macular degeneration: Secondary | ICD-10-CM | POA: Diagnosis not present

## 2017-12-18 DIAGNOSIS — W19XXXA Unspecified fall, initial encounter: Secondary | ICD-10-CM | POA: Diagnosis not present

## 2017-12-18 DIAGNOSIS — B9620 Unspecified Escherichia coli [E. coli] as the cause of diseases classified elsewhere: Secondary | ICD-10-CM | POA: Diagnosis not present

## 2017-12-18 DIAGNOSIS — R531 Weakness: Secondary | ICD-10-CM | POA: Diagnosis not present

## 2017-12-18 DIAGNOSIS — N183 Chronic kidney disease, stage 3 (moderate): Secondary | ICD-10-CM | POA: Diagnosis not present

## 2017-12-18 DIAGNOSIS — R2689 Other abnormalities of gait and mobility: Secondary | ICD-10-CM | POA: Diagnosis not present

## 2017-12-18 DIAGNOSIS — L89152 Pressure ulcer of sacral region, stage 2: Secondary | ICD-10-CM | POA: Diagnosis not present

## 2017-12-18 DIAGNOSIS — M1990 Unspecified osteoarthritis, unspecified site: Secondary | ICD-10-CM | POA: Diagnosis not present

## 2017-12-18 DIAGNOSIS — M2550 Pain in unspecified joint: Secondary | ICD-10-CM | POA: Diagnosis not present

## 2017-12-18 DIAGNOSIS — E860 Dehydration: Secondary | ICD-10-CM | POA: Diagnosis not present

## 2017-12-18 DIAGNOSIS — B372 Candidiasis of skin and nail: Secondary | ICD-10-CM | POA: Diagnosis not present

## 2017-12-18 DIAGNOSIS — I10 Essential (primary) hypertension: Secondary | ICD-10-CM | POA: Diagnosis not present

## 2017-12-18 DIAGNOSIS — E039 Hypothyroidism, unspecified: Secondary | ICD-10-CM | POA: Diagnosis not present

## 2017-12-18 DIAGNOSIS — M25512 Pain in left shoulder: Secondary | ICD-10-CM | POA: Diagnosis not present

## 2017-12-18 DIAGNOSIS — N390 Urinary tract infection, site not specified: Secondary | ICD-10-CM | POA: Diagnosis not present

## 2017-12-18 DIAGNOSIS — M25519 Pain in unspecified shoulder: Secondary | ICD-10-CM | POA: Diagnosis not present

## 2017-12-18 DIAGNOSIS — N3001 Acute cystitis with hematuria: Secondary | ICD-10-CM | POA: Diagnosis not present

## 2017-12-18 DIAGNOSIS — Z8249 Family history of ischemic heart disease and other diseases of the circulatory system: Secondary | ICD-10-CM | POA: Diagnosis not present

## 2017-12-18 DIAGNOSIS — M79671 Pain in right foot: Secondary | ICD-10-CM | POA: Diagnosis not present

## 2017-12-18 DIAGNOSIS — Z23 Encounter for immunization: Secondary | ICD-10-CM | POA: Diagnosis not present

## 2017-12-18 DIAGNOSIS — I129 Hypertensive chronic kidney disease with stage 1 through stage 4 chronic kidney disease, or unspecified chronic kidney disease: Secondary | ICD-10-CM | POA: Diagnosis not present

## 2017-12-18 DIAGNOSIS — E785 Hyperlipidemia, unspecified: Secondary | ICD-10-CM | POA: Diagnosis not present

## 2017-12-18 DIAGNOSIS — M79672 Pain in left foot: Secondary | ICD-10-CM | POA: Diagnosis not present

## 2017-12-18 DIAGNOSIS — N179 Acute kidney failure, unspecified: Secondary | ICD-10-CM | POA: Diagnosis not present

## 2017-12-18 DIAGNOSIS — N3000 Acute cystitis without hematuria: Secondary | ICD-10-CM | POA: Diagnosis not present

## 2017-12-18 DIAGNOSIS — R41841 Cognitive communication deficit: Secondary | ICD-10-CM | POA: Diagnosis not present

## 2017-12-18 DIAGNOSIS — R296 Repeated falls: Secondary | ICD-10-CM | POA: Diagnosis not present

## 2017-12-18 LAB — URINE CULTURE: Culture: >=100000 — AB

## 2017-12-18 MED ORDER — AMOXICILLIN 500 MG PO CAPS
500.0000 mg | ORAL_CAPSULE | Freq: Two times a day (BID) | ORAL | Status: DC
  Administered 2017-12-18: 500 mg via ORAL
  Filled 2017-12-18: qty 1

## 2017-12-18 MED ORDER — AMOXICILLIN 500 MG PO CAPS: 500.0000 mg | ORAL_CAPSULE | Freq: Two times a day (BID) | ORAL | 0 refills | Status: AC

## 2017-12-18 MED ORDER — ONDANSETRON HCL 4 MG PO TABS: 4.0000 mg | ORAL_TABLET | Freq: Four times a day (QID) | ORAL | 0 refills | Status: DC | PRN

## 2017-12-18 MED ORDER — NYSTATIN-TRIAMCINOLONE 100000-0.1 UNIT/GM-% EX CREA: TOPICAL_CREAM | Freq: Two times a day (BID) | CUTANEOUS | 0 refills | Status: DC

## 2017-12-18 NOTE — Progress Notes (Signed)
Patient has insurance approval to discharge to [facility] today.  [name] LCSW [phone]  

## 2017-12-18 NOTE — Discharge Instructions (Signed)
1) take amoxicillin 500 mg twice a day for 3 additional days as prescribed for E. coli urinary tract infection 2) fall precautions 3) dietary/nutritional consult recommended 4) home occupational therapy evaluation post discharge from skilled nursing facility recommended

## 2017-12-18 NOTE — Clinical Social Work Placement (Signed)
   CLINICAL SOCIAL WORK PLACEMENT  NOTE  Date:  12/18/2017  Patient Details  Name: [name] MRN: [id number] Date of Birth: 1924-11-18  Clinical Social Work is seeking post-discharge placement for this patient at the Skilled  Nursing Facility level of care (*CSW will initial, date and re-position this form in  chart as items are completed):  Yes   Patient/family provided with [facility] Clinical Social Work Department's list of facilities offering this level of care within the geographic area requested by the patient (or if unable, by the patient's family).  Yes   Patient/family informed of their freedom to choose among providers that offer the needed level of care, that participate in Medicare, Medicaid or managed care program needed by the patient, have an available bed and are willing to accept the patient.  Yes   Patient/family informed of [facility] ownership interest in [facility] and [facility], as well as of the fact that they are under no obligation to receive care at these facilities.  PASRR submitted to EDS on 12/17/17     PASRR number received on 12/17/17     Existing PASRR number confirmed on       FL2 transmitted to all facilities in geographic area requested by pt/family on 12/17/17     FL2 transmitted to all facilities within larger geographic area on       Patient informed that his/her managed care company has contracts with or will negotiate with certain facilities, including the following:        Yes   Patient/family informed of bed offers received.  Patient chooses bed at [facility]     Physician recommends and patient chooses bed at      Patient to be transferred to [facility] on 12/18/17.  Patient to be transferred to facility by [facility]     Patient family notified on 12/18/17 of transfer.  Name of family member notified:  Cecille Po     PHYSICIAN Please prepare priority discharge summary, including medications     Additional  Comment:    _______________________________________________ Mearl Latin, LCSW 12/18/2017, 1:16 PM

## 2017-12-18 NOTE — Progress Notes (Signed)
Urine came with ecoli that is pan sens. She will likely be dc to SNF today. D/w with Dr. Mariea Clonts, we will de-escalate her to amoxillin for another 3d.   Ulyses Southward, PharmD, [place], AAHIVP, CPP Infectious Disease Pharmacist Pager: [phone] 12/18/2017 10:39 AM

## 2017-12-18 NOTE — Progress Notes (Addendum)
Patient will DC to: [facility] Anticipated DC date: 12/18/17 Family notified: Neighbor at bedside and Risk analyst by: Sharin Mons (behind)  CSW provided Medicaid application and transportation resources in patient's bag.  Per MD patient ready for DC to [facility]. RN, patient, patient's family, and facility notified of DC. Discharge Summary and FL2 sent to facility. RN to call report prior to discharge [phone]). DC packet on chart. Ambulance transport requested for patient.   CSW will sign off for now as social work intervention is no longer needed. Please consult [place] again if new needs arise.  Cristobal Goldmann, LCSW Clinical Social Worker [phone]

## 2017-12-18 NOTE — Progress Notes (Signed)
Report given to [facility]. All questions were answered.

## 2017-12-18 NOTE — Discharge Summary (Signed)
[name], is a 84 y.o. female  DOB 1924/02/27  MRN [id number].  Admission date:  12/15/2017  Admitting Physician  Rometta Emery, MD  Discharge Date:  12/18/2017   Primary MD  Jarrett Soho, PA-C  Recommendations for primary care physician for things to follow:   1)Take Amoxicillin 500 mg twice a day for 3 additional days as prescribed for E. coli urinary tract infection 2)Fall precautions 3)Dietary/Nutritional consult recommended 4)Home occupational therapy evaluation post discharge from skilled nursing facility recommended   Admission Diagnosis  Dehydration [E86.0] Acute UTI [N39.0] AKI (acute kidney injury) (HCC) [N17.9] Fall from slip, trip, or stumble, initial encounter [W01.0XXA] Traumatic rhabdomyolysis, initial encounter (HCC) [T79.6XXA]   Discharge Diagnosis  Dehydration [E86.0] Acute UTI [N39.0] AKI (acute kidney injury) (HCC) [N17.9] Fall from slip, trip, or stumble, initial encounter [W01.0XXA] Traumatic rhabdomyolysis, initial encounter (HCC) [T79.6XXA]    Principal Problem:   Mechanical Fall Active Problems:   E coli UTI (urinary tract infection)   ARF (acute renal failure) (HCC)   Hypertension   Hypothyroidism   Hyperlipidemia      Past Medical History:  Diagnosis Date  . ARF (acute renal failure) (HCC) 11/2017  . Arthritis   . Carotid artery occlusion    Bruit found August 2009  . Hypertension   . Hypothyroidism    Hypothyroidism  . Macular degeneration of left eye   . Multiple falls 12/16/2017  . UTI (urinary tract infection) 12/16/2017    Past Surgical History:  Procedure Laterality Date  . APPENDECTOMY    . CAROTID ENDARTERECTOMY  04/14/2008   Right  . CATARACT EXTRACTION    . LUMBAR LAMINECTOMY         HPI  from the history and physical done on the day of admission:    Patient coming from: Home  Chief Complaint: Fall  HPI: [name] is a  84 y.o. female with medical history significant of hypertension, carotid artery occlusion, hypothyroidism who was brought in to the ER with fall.  Patient apparently was last seen yesterday.  She lives by herself at home.  Neighbors tried to contact them where unable to so they went to her place today and found her on the floor.  She denied any chest pain.  No fever no chills.  Patient is a poor historian and feels weak.  She was unable to give adequate history right now.  She appears slightly unkempt and dehydrated.  Patient was found to have acute kidney injury with UTI and is being admitted to the hospital for treatment.  No family is currently available to give more details.  ED Course: Her temperature is 97.8 to blood pressure 161/69, pulse is 81 respiratory 22 oxygen sats 95% on room air.  Patient has a white count of 16.6 with a BUN of 41 creatinine 1.78 glucose 108.  Urinalysis showed large leukocytes negative nitrite many bacteria WBC 11-20.  Head CT without contrast is negative.  EKG showed no significant findings.  Patient suspected to have had syncopal episode probably secondary  to dehydration and UTI.  She is being admitted to the hospital for treatment.   Hospital Course:    Brief Summary  84 y.o.femalewith medical history significant ofhypertension, carotid artery occlusion, hypothyroidism admitted on 12/15/2017 after a mechanical fall at home with concerns about concomitant UTI  Plan:- 1)Generalized Weakness/Recurrent Falls-  unsteadiness , patient has significant difficulties with ADLs, especially mobility related activities of daily living , no new focal deficits, no tremors, physical therapy eval appreciated, they recommend skilled nursing facility rehab prior to returning home, patient lives at home alone with no help  2)E coli UTI--- clinically improving, treated with IV Rocephin, discharged on amoxicillin per sensitivity report ,  white count is down to 8.1 from  16.6 on  admission,   3)AKI----acute kidney injury on CKD stage - III to IV due to dehydration, creatinine on admission= 1.78  ,   baseline creatinine = 1.2 to 1.3   , creatinine is now=1.1  , renally adjust medications, avoid nephrotoxic agents/dehydration/hypotension,Dehydration has resolved and renal function has normalized after IV fluid .  Oral intake is currently for  4)HTN--- stable, atenolol 25 mg daily, restart  lisinopril due to kidney concerns  5)Hypothyroidism--- stable, continue levothyroxine 50 mcg daily  6)Social/Ethics--- Full Code, patient is alert, oriented, she is agreeable to SNF rehab prior to returning home, she is very high risk for fall and self injury should she return home  7)Small stage 2 pressure ulcer noted on sacrum and improving  Candida intertrigo--- wound care as advised, Mycolog ointment as ordered  Code Status : Full  Disposition Plan  : SNF   Consults  :  PT/SW/CM  Discharge Condition: stable  Follow UP   Contact information for follow-up providers    [facility] - Dme Follow up.   Why:  will deliver 3n1 bedside commode to room to take home Contact information: [address] [place] [address] [phone]        Jobe Gibbon, [facility] Follow up.   Specialty:  Home Health Services Why:  local number [phone] Home Health RN, Physical Therapy, Occupational Therapy, aide and Social Worker-agency will call to arrange initial appointment Contact information: [address] [place] [address] [phone]            Contact information for after-discharge care    Destination    [facility] Preferred SNF .   Service:  Skilled Nursing Contact information: [address] [place] [address] [phone]                  Diet and Activity recommendation:  As advised  Discharge Instructions    Discharge Instructions    Call MD for:  difficulty breathing,  headache or visual disturbances   Complete by:  As directed    Call MD for:  persistant dizziness or light-headedness   Complete by:  As directed    Call MD for:  persistant nausea and vomiting   Complete by:  As directed    Call MD for:  severe uncontrolled pain   Complete by:  As directed    Call MD for:  temperature >100.4   Complete by:  As directed    Diet - low sodium heart healthy   Complete by:  As directed    Discharge instructions   Complete by:  As directed    1) take amoxicillin 500 mg twice a day for 3 additional days as prescribed  for E. coli urinary tract infection 2) fall precautions 3) dietary/nutritional consult recommended 4) home occupational therapy evaluation post discharge from skilled nursing facility recommended   Walk with assistance   Complete by:  As directed    Fall Precautions       Discharge Medications     Allergies as of 12/18/2017   No Known Allergies     Medication List    TAKE these medications   amoxicillin 500 MG capsule Commonly known as:  AMOXIL Take 1 capsule (500 mg total) by mouth 2 (two) times daily for 3 days.   aspirin 81 MG tablet Take 81 mg by mouth daily.   atenolol 25 MG tablet Commonly known as:  TENORMIN Take 25 mg by mouth daily.   atorvastatin 20 MG tablet Commonly known as:  LIPITOR Take 20 mg by mouth daily.   CALTRATE 600 PLUS-VIT D PO Take 2 tablets by mouth daily.   levothyroxine 75 MCG tablet Commonly known as:  SYNTHROID, LEVOTHROID Take 50 mcg by mouth daily before breakfast.   lisinopril 10 MG tablet Commonly known as:  PRINIVIL,ZESTRIL Take 10 mg by mouth daily.   nystatin-triamcinolone cream Commonly known as:  MYCOLOG II Apply topically 2 (two) times daily.   ondansetron 4 MG tablet Commonly known as:  ZOFRAN Take 1 tablet (4 mg total) by mouth every 6 (six) hours as needed for nausea.   OVER THE COUNTER MEDICATION Place 1 drop into both eyes 2 (two) times daily. OTC eye drop for  macular degeneration            Durable Medical Equipment  (From admission, onward)         Start     Ordered   12/16/17 1458  For home use only DME 3 n 1  Once     12/16/17 1457          Major procedures and Radiology Reports - PLEASE review detailed and final reports for all details, in brief -   Ct Head Wo Contrast  Result Date: 12/15/2017 CLINICAL DATA:  Found on floor EXAM: CT HEAD WITHOUT CONTRAST TECHNIQUE: Contiguous axial images were obtained from the base of the skull through the vertex without intravenous contrast. COMPARISON:  07/08/2017 FINDINGS: Brain: No acute territorial infarction, hemorrhage or intracranial mass. Marked atrophy. Mild small vessel ischemic changes of the white matter. Vascular: No hyperdense vessels. Vertebral and carotid vascular calcification Skull: Normal. Negative for fracture or focal lesion. Sinuses/Orbits: Mucosal thickening in the maxillary and ethmoid sinuses. Other: None IMPRESSION: 1. No CT evidence for acute intracranial abnormality. 2. Atrophy and small vessel ischemic changes of the white matter Electronically Signed   By: Jasmine Pang M.D.   On: 12/15/2017 18:08    Micro Results   Recent Results (from the past 240 hour(s))  Urine Culture     Status: Abnormal   Collection Time: 12/15/17  5:47 PM  Result Value Ref Range Status   Specimen Description URINE, RANDOM  Final   Special Requests   Final    NONE Performed at [facility], [address], [place], [place] [address]    Culture >=100,000 COLONIES/mL ESCHERICHIA COLI (A)  Final   Report Status 12/18/2017 FINAL  Final   Organism ID, Bacteria ESCHERICHIA COLI (A)  Final      Susceptibility   Escherichia coli - MIC*    AMPICILLIN 4 SENSITIVE Sensitive     CEFAZOLIN <=4 SENSITIVE Sensitive     CEFTRIAXONE <=1 SENSITIVE  Sensitive     CIPROFLOXACIN 0.5 SENSITIVE Sensitive     GENTAMICIN <=1 SENSITIVE Sensitive     IMIPENEM <=0.25 SENSITIVE Sensitive      NITROFURANTOIN <=16 SENSITIVE Sensitive     TRIMETH/SULFA <=20 SENSITIVE Sensitive     AMPICILLIN/SULBACTAM <=2 SENSITIVE Sensitive     PIP/TAZO <=4 SENSITIVE Sensitive     Extended ESBL NEGATIVE Sensitive     * >=100,000 COLONIES/mL ESCHERICHIA COLI       Today   Subjective    [name] today has no new concerns, fevers, no nausea no vomiting no abdominal pain, oral intake is fair no chest pains         Patient has been seen and examined prior to discharge   Objective   Blood pressure (!) 147/72, pulse 67, temperature 98.5 F (36.9 C), temperature source Oral, resp. rate 18, height 5\' 1"  (1.549 m), weight 62.1 kg, SpO2 98 %.   Intake/Output Summary (Last 24 hours) at 12/18/2017 1402 Last data filed at 12/18/2017 0300 Gross per 24 hour  Intake 1014.41 ml  Output 801 ml  Net 213.41 ml    Exam Patient is examined daily including today on 12/18/17 , exams remain the same as of yesterday except that has changed   Gen:- Awake Alert,  In no apparent distress  HEENT:- [place].AT, No sclera icterus Neck-Supple Neck,No JVD,.  Lungs-  CTAB , fair air movement CV- S1, S2 normal, regular, 4/6 SM Abd-  +ve B.Sounds, Abd Soft, No tenderness,    Extremity/Skin:- No  edema, good pulses,  Small stage 2 pressure ulcer noted on sacrum, improving rash in inguinal folds with Candida intertrigo type findings, Psych-affect is appropriate, oriented x3 Neuro-generalized weakness, unsteadiness , patient has significant difficulties with ADLs especially mobility related activities of daily living , no new focal deficits, no tremors   Data Review   CBC w Diff:  Lab Results  Component Value Date   WBC 8.1 12/17/2017   HGB 10.4 (L) 12/17/2017   HCT 33.3 (L) 12/17/2017   PLT 127 (L) 12/17/2017    CMP:  Lab Results  Component Value Date   NA 143 12/17/2017   K 4.0 12/17/2017   CL 116 (H) 12/17/2017   CO2 20 (L) 12/17/2017   BUN 31 (H) 12/17/2017   CREATININE 1.12 (H) 12/17/2017    PROT 5.9 (L) 12/16/2017   ALBUMIN 2.7 (L) 12/16/2017   BILITOT 0.9 12/16/2017   ALKPHOS 61 12/16/2017   AST 59 (H) 12/16/2017   ALT 25 12/16/2017    Total Discharge time is about 33 minutes  Shon Hale M.D on 12/18/2017 at 2:02 PM  Pager---[phone]  Go to [email or web address] - password [email or web address] for contact info  [facility] - Office  [phone]

## 2017-12-21 DIAGNOSIS — N390 Urinary tract infection, site not specified: Secondary | ICD-10-CM | POA: Diagnosis not present

## 2017-12-21 DIAGNOSIS — M25512 Pain in left shoulder: Secondary | ICD-10-CM | POA: Diagnosis not present

## 2017-12-21 DIAGNOSIS — I10 Essential (primary) hypertension: Secondary | ICD-10-CM | POA: Diagnosis not present

## 2017-12-22 DIAGNOSIS — M25512 Pain in left shoulder: Secondary | ICD-10-CM | POA: Diagnosis not present

## 2017-12-22 DIAGNOSIS — M6281 Muscle weakness (generalized): Secondary | ICD-10-CM | POA: Diagnosis not present

## 2017-12-22 DIAGNOSIS — Z9181 History of falling: Secondary | ICD-10-CM | POA: Diagnosis not present

## 2017-12-22 DIAGNOSIS — M25519 Pain in unspecified shoulder: Secondary | ICD-10-CM | POA: Diagnosis not present

## 2017-12-24 DIAGNOSIS — H3530 Unspecified macular degeneration: Secondary | ICD-10-CM | POA: Diagnosis not present

## 2017-12-24 DIAGNOSIS — N179 Acute kidney failure, unspecified: Secondary | ICD-10-CM | POA: Diagnosis not present

## 2017-12-24 DIAGNOSIS — I10 Essential (primary) hypertension: Secondary | ICD-10-CM | POA: Diagnosis not present

## 2017-12-24 DIAGNOSIS — E039 Hypothyroidism, unspecified: Secondary | ICD-10-CM | POA: Diagnosis not present

## 2017-12-31 DIAGNOSIS — M6281 Muscle weakness (generalized): Secondary | ICD-10-CM | POA: Diagnosis not present

## 2017-12-31 DIAGNOSIS — M79671 Pain in right foot: Secondary | ICD-10-CM | POA: Diagnosis not present

## 2017-12-31 DIAGNOSIS — M79672 Pain in left foot: Secondary | ICD-10-CM | POA: Diagnosis not present

## 2017-12-31 DIAGNOSIS — M25512 Pain in left shoulder: Secondary | ICD-10-CM | POA: Diagnosis not present

## 2018-01-06 DIAGNOSIS — M6281 Muscle weakness (generalized): Secondary | ICD-10-CM | POA: Diagnosis not present

## 2018-01-06 DIAGNOSIS — M25512 Pain in left shoulder: Secondary | ICD-10-CM | POA: Diagnosis not present

## 2018-01-06 DIAGNOSIS — Z9181 History of falling: Secondary | ICD-10-CM | POA: Diagnosis not present

## 2018-01-11 DIAGNOSIS — R2689 Other abnormalities of gait and mobility: Secondary | ICD-10-CM | POA: Diagnosis not present

## 2018-01-11 DIAGNOSIS — B9620 Unspecified Escherichia coli [E. coli] as the cause of diseases classified elsewhere: Secondary | ICD-10-CM | POA: Diagnosis not present

## 2018-01-14 ENCOUNTER — Encounter (HOSPITAL_COMMUNITY): Payer: Self-pay

## 2018-01-14 ENCOUNTER — Other Ambulatory Visit: Payer: Self-pay

## 2018-01-14 ENCOUNTER — Inpatient Hospital Stay (HOSPITAL_COMMUNITY)
Admission: EM | Admit: 2018-01-14 | Discharge: 2018-01-20 | DRG: 872 | Disposition: A | Discharge status: Skilled Nursing Facility | Payer: Medicare Other | Attending: Internal Medicine | Admitting: Internal Medicine

## 2018-01-14 ENCOUNTER — Emergency Department (HOSPITAL_COMMUNITY): Payer: Medicare Other

## 2018-01-14 DIAGNOSIS — R627 Adult failure to thrive: Secondary | ICD-10-CM | POA: Diagnosis not present

## 2018-01-14 DIAGNOSIS — E872 Acidosis: Secondary | ICD-10-CM | POA: Diagnosis present

## 2018-01-14 DIAGNOSIS — Z66 Do not resuscitate: Secondary | ICD-10-CM | POA: Diagnosis present

## 2018-01-14 DIAGNOSIS — Z8249 Family history of ischemic heart disease and other diseases of the circulatory system: Secondary | ICD-10-CM

## 2018-01-14 DIAGNOSIS — Z79890 Hormone replacement therapy (postmenopausal): Secondary | ICD-10-CM

## 2018-01-14 DIAGNOSIS — R5381 Other malaise: Secondary | ICD-10-CM | POA: Diagnosis not present

## 2018-01-14 DIAGNOSIS — R011 Cardiac murmur, unspecified: Secondary | ICD-10-CM | POA: Diagnosis not present

## 2018-01-14 DIAGNOSIS — A419 Sepsis, unspecified organism: Principal | ICD-10-CM | POA: Diagnosis present

## 2018-01-14 DIAGNOSIS — M2550 Pain in unspecified joint: Secondary | ICD-10-CM | POA: Diagnosis not present

## 2018-01-14 DIAGNOSIS — M19041 Primary osteoarthritis, right hand: Secondary | ICD-10-CM | POA: Diagnosis not present

## 2018-01-14 DIAGNOSIS — Z7982 Long term (current) use of aspirin: Secondary | ICD-10-CM | POA: Diagnosis not present

## 2018-01-14 DIAGNOSIS — N390 Urinary tract infection, site not specified: Secondary | ICD-10-CM | POA: Diagnosis not present

## 2018-01-14 DIAGNOSIS — I10 Essential (primary) hypertension: Secondary | ICD-10-CM | POA: Diagnosis present

## 2018-01-14 DIAGNOSIS — Z87440 Personal history of urinary (tract) infections: Secondary | ICD-10-CM

## 2018-01-14 DIAGNOSIS — R3129 Other microscopic hematuria: Secondary | ICD-10-CM | POA: Diagnosis not present

## 2018-01-14 DIAGNOSIS — R531 Weakness: Secondary | ICD-10-CM | POA: Diagnosis not present

## 2018-01-14 DIAGNOSIS — R509 Fever, unspecified: Secondary | ICD-10-CM | POA: Diagnosis not present

## 2018-01-14 DIAGNOSIS — R296 Repeated falls: Secondary | ICD-10-CM | POA: Diagnosis not present

## 2018-01-14 DIAGNOSIS — Z79899 Other long term (current) drug therapy: Secondary | ICD-10-CM | POA: Diagnosis not present

## 2018-01-14 DIAGNOSIS — Z7401 Bed confinement status: Secondary | ICD-10-CM | POA: Diagnosis not present

## 2018-01-14 DIAGNOSIS — D631 Anemia in chronic kidney disease: Secondary | ICD-10-CM | POA: Diagnosis not present

## 2018-01-14 DIAGNOSIS — N179 Acute kidney failure, unspecified: Secondary | ICD-10-CM | POA: Diagnosis present

## 2018-01-14 DIAGNOSIS — M19042 Primary osteoarthritis, left hand: Secondary | ICD-10-CM | POA: Diagnosis not present

## 2018-01-14 DIAGNOSIS — E785 Hyperlipidemia, unspecified: Secondary | ICD-10-CM | POA: Diagnosis present

## 2018-01-14 DIAGNOSIS — L89151 Pressure ulcer of sacral region, stage 1: Secondary | ICD-10-CM | POA: Diagnosis present

## 2018-01-14 DIAGNOSIS — E039 Hypothyroidism, unspecified: Secondary | ICD-10-CM | POA: Diagnosis not present

## 2018-01-14 DIAGNOSIS — Z823 Family history of stroke: Secondary | ICD-10-CM | POA: Diagnosis not present

## 2018-01-14 DIAGNOSIS — H3530 Unspecified macular degeneration: Secondary | ICD-10-CM | POA: Diagnosis present

## 2018-01-14 DIAGNOSIS — L89152 Pressure ulcer of sacral region, stage 2: Secondary | ICD-10-CM | POA: Insufficient documentation

## 2018-01-14 LAB — PROTIME-INR
INR: 1.01
PROTHROMBIN TIME: 13.2 s (ref 11.4–15.2)

## 2018-01-14 LAB — COMPREHENSIVE METABOLIC PANEL
ALT: 12 U/L (ref 0–44)
AST: 26 U/L (ref 15–41)
Albumin: 3.9 g/dL (ref 3.5–5.0)
Alkaline Phosphatase: 86 U/L (ref 38–126)
Anion gap: 13 (ref 5–15)
BUN: 31 mg/dL — AB (ref 8–23)
CHLORIDE: 102 mmol/L (ref 98–111)
CO2: 25 mmol/L (ref 22–32)
CREATININE: 1.25 mg/dL — AB (ref 0.44–1.00)
Calcium: 9.4 mg/dL (ref 8.9–10.3)
GFR calc Af Amer: 42 mL/min — ABNORMAL LOW (ref >60)
GFR calc non Af Amer: 36 mL/min — ABNORMAL LOW (ref >60)
Glucose, Bld: 134 mg/dL — ABNORMAL HIGH (ref 70–99)
Potassium: 3.9 mmol/L (ref 3.5–5.1)
Sodium: 140 mmol/L (ref 135–145)
Total Bilirubin: 1.1 mg/dL (ref 0.3–1.2)
Total Protein: 8.3 g/dL — ABNORMAL HIGH (ref 6.5–8.1)

## 2018-01-14 LAB — CBC WITH DIFFERENTIAL/PLATELET
Abs Immature Granulocytes: 0.08 10*3/uL — ABNORMAL HIGH (ref 0.00–0.07)
BASOS ABS: 0 10*3/uL (ref 0.0–0.1)
Basophils Relative: 0 %
EOS PCT: 0 %
Eosinophils Absolute: 0 10*3/uL (ref 0.0–0.5)
HEMATOCRIT: 40.7 % (ref 36.0–46.0)
HEMOGLOBIN: 12.2 g/dL (ref 12.0–15.0)
Immature Granulocytes: 1 %
LYMPHS ABS: 1.7 10*3/uL (ref 0.7–4.0)
LYMPHS PCT: 12 %
MCH: 29.3 pg (ref 26.0–34.0)
MCHC: 30 g/dL (ref 30.0–36.0)
MCV: 97.6 fL (ref 80.0–100.0)
MONO ABS: 1.6 10*3/uL — AB (ref 0.1–1.0)
Monocytes Relative: 11 %
NRBC: 0 % (ref 0.0–0.2)
Neutro Abs: 11 10*3/uL — ABNORMAL HIGH (ref 1.7–7.7)
Neutrophils Relative %: 76 %
Platelets: 204 10*3/uL (ref 150–400)
RBC: 4.17 MIL/uL (ref 3.87–5.11)
RDW: 15 % (ref 11.5–15.5)
WBC: 14.3 10*3/uL — ABNORMAL HIGH (ref 4.0–10.5)

## 2018-01-14 LAB — PHOSPHORUS: PHOSPHORUS: 2.2 mg/dL — AB (ref 2.5–4.6)

## 2018-01-14 LAB — APTT: aPTT: 32 seconds (ref 24–36)

## 2018-01-14 LAB — LACTIC ACID, PLASMA: Lactic Acid, Venous: 1.9 mmol/L (ref 0.5–1.9)

## 2018-01-14 LAB — I-STAT CG4 LACTIC ACID, ED: Lactic Acid, Venous: 4 mmol/L (ref 0.5–1.9)

## 2018-01-14 LAB — MAGNESIUM: Magnesium: 1.9 mg/dL (ref 1.7–2.4)

## 2018-01-14 MED ORDER — SODIUM CHLORIDE 0.9 % IV SOLN: 2.0000 g | Freq: Once | INTRAVENOUS | Status: DC

## 2018-01-14 MED ORDER — IPRATROPIUM BROMIDE 0.02 % IN SOLN: 0.5000 mg | Freq: Four times a day (QID) | RESPIRATORY_TRACT | Status: DC | PRN

## 2018-01-14 MED ORDER — SODIUM CHLORIDE 0.9 % IV SOLN
2.0000 g | Freq: Once | INTRAVENOUS | Status: AC
  Administered 2018-01-14: 2 g via INTRAVENOUS
  Filled 2018-01-14: qty 2

## 2018-01-14 MED ORDER — ONDANSETRON HCL 4 MG/2ML IJ SOLN: 4.0000 mg | Freq: Four times a day (QID) | INTRAMUSCULAR | Status: DC | PRN

## 2018-01-14 MED ORDER — SODIUM CHLORIDE 0.9 % IV BOLUS (SEPSIS)
1000.0000 mL | Freq: Once | INTRAVENOUS | Status: AC
  Administered 2018-01-14: 1000 mL via INTRAVENOUS

## 2018-01-14 MED ORDER — LEVOTHYROXINE SODIUM 50 MCG PO TABS
75.0000 ug | ORAL_TABLET | Freq: Every day | ORAL | Status: DC
  Administered 2018-01-15 – 2018-01-20 (×6): 75 ug via ORAL
  Filled 2018-01-14 (×6): qty 1

## 2018-01-14 MED ORDER — SODIUM CHLORIDE 0.9 % IV BOLUS (SEPSIS): 1000.0000 mL | Freq: Once | INTRAVENOUS | Status: DC

## 2018-01-14 MED ORDER — VANCOMYCIN HCL IN DEXTROSE 1-5 GM/200ML-% IV SOLN: 1000.0000 mg | Freq: Once | INTRAVENOUS | Status: DC

## 2018-01-14 MED ORDER — ATORVASTATIN CALCIUM 20 MG PO TABS
20.0000 mg | ORAL_TABLET | Freq: Every day | ORAL | Status: DC
  Administered 2018-01-15 – 2018-01-20 (×6): 20 mg via ORAL
  Filled 2018-01-14 (×6): qty 1

## 2018-01-14 MED ORDER — ATENOLOL 25 MG PO TABS
25.0000 mg | ORAL_TABLET | Freq: Every day | ORAL | Status: DC
  Administered 2018-01-15 – 2018-01-20 (×6): 25 mg via ORAL
  Filled 2018-01-14 (×6): qty 1

## 2018-01-14 MED ORDER — ALBUTEROL SULFATE (2.5 MG/3ML) 0.083% IN NEBU: 2.5000 mg | INHALATION_SOLUTION | Freq: Four times a day (QID) | RESPIRATORY_TRACT | Status: DC | PRN

## 2018-01-14 MED ORDER — SENNOSIDES-DOCUSATE SODIUM 8.6-50 MG PO TABS: 1.0000 | ORAL_TABLET | Freq: Every evening | ORAL | Status: DC | PRN

## 2018-01-14 MED ORDER — ACETAMINOPHEN 650 MG RE SUPP: 650.0000 mg | Freq: Four times a day (QID) | RECTAL | Status: DC | PRN

## 2018-01-14 MED ORDER — ACETAMINOPHEN 325 MG PO TABS
650.0000 mg | ORAL_TABLET | Freq: Four times a day (QID) | ORAL | Status: DC | PRN
  Administered 2018-01-17 – 2018-01-20 (×3): 650 mg via ORAL
  Filled 2018-01-14 (×3): qty 2

## 2018-01-14 MED ORDER — ASPIRIN EC 81 MG PO TBEC
81.0000 mg | DELAYED_RELEASE_TABLET | Freq: Every day | ORAL | Status: DC
  Administered 2018-01-15 – 2018-01-20 (×6): 81 mg via ORAL
  Filled 2018-01-14 (×6): qty 1

## 2018-01-14 MED ORDER — MAGNESIUM CITRATE PO SOLN: 1.0000 | Freq: Once | ORAL | Status: DC | PRN

## 2018-01-14 MED ORDER — OXYCODONE HCL 5 MG PO TABS
5.0000 mg | ORAL_TABLET | ORAL | Status: DC | PRN
  Administered 2018-01-15 – 2018-01-16 (×2): 5 mg via ORAL
  Filled 2018-01-14 (×2): qty 1

## 2018-01-14 MED ORDER — VANCOMYCIN HCL IN DEXTROSE 1-5 GM/200ML-% IV SOLN
1000.0000 mg | Freq: Once | INTRAVENOUS | Status: AC
  Administered 2018-01-14: 1000 mg via INTRAVENOUS
  Filled 2018-01-14: qty 200

## 2018-01-14 MED ORDER — ONDANSETRON HCL 4 MG PO TABS
4.0000 mg | ORAL_TABLET | Freq: Four times a day (QID) | ORAL | Status: DC | PRN
  Administered 2018-01-20: 4 mg via ORAL
  Filled 2018-01-14: qty 1

## 2018-01-14 MED ORDER — SODIUM CHLORIDE 0.9 % IV BOLUS (SEPSIS)
1000.0000 mL | Freq: Once | INTRAVENOUS | Status: AC
  Administered 2018-01-15: 1000 mL via INTRAVENOUS

## 2018-01-14 MED ORDER — VANCOMYCIN HCL 500 MG IV SOLR: 500.0000 mg | INTRAVENOUS | Status: DC

## 2018-01-14 MED ORDER — ENOXAPARIN SODIUM 30 MG/0.3ML ~~LOC~~ SOLN
30.0000 mg | Freq: Every day | SUBCUTANEOUS | Status: DC
  Administered 2018-01-15 (×2): 30 mg via SUBCUTANEOUS
  Filled 2018-01-14 (×2): qty 0.3

## 2018-01-14 MED ORDER — SODIUM CHLORIDE 0.9 % IV SOLN: 1.0000 g | INTRAVENOUS | Status: DC

## 2018-01-14 MED ORDER — BISACODYL 5 MG PO TBEC: 5.0000 mg | DELAYED_RELEASE_TABLET | Freq: Every day | ORAL | Status: DC | PRN

## 2018-01-14 MED ORDER — METRONIDAZOLE IN NACL 5-0.79 MG/ML-% IV SOLN
500.0000 mg | Freq: Three times a day (TID) | INTRAVENOUS | Status: DC
  Administered 2018-01-15: 500 mg via INTRAVENOUS
  Filled 2018-01-14: qty 100

## 2018-01-14 MED ORDER — METRONIDAZOLE IN NACL 5-0.79 MG/ML-% IV SOLN
500.0000 mg | Freq: Three times a day (TID) | INTRAVENOUS | Status: DC
  Administered 2018-01-14: 500 mg via INTRAVENOUS
  Filled 2018-01-14: qty 100

## 2018-01-14 NOTE — ED Provider Notes (Signed)
[facility]-EMERGENCY DEPT Provider Note   CSN: [id number] Arrival date & time: 01/14/18  1810     History   Chief Complaint No chief complaint on file.   HPI [name] is a 84 y.o. female.  HPI 84 year old female discharged 1025 after admission for urinary tract infection , acute kidney injury, rhabdomyolysis, and fall she was [facility].  She was discharged from [facility] on Sunday due to lack of funding.  She is at home.  She is unable to walk on her own.  She reports that her friend has been coming and helping her to the bathroom and bring her food.  Today the friend did not come.  Home health came in and found her in her bed sitting in urine.  He reports that she feels very cold and is chilled.  She denies chest pain, headache, shortness of breath, nausea, vomiting, diarrhea, or urinary tract infection symptoms. Past Medical History:  Diagnosis Date  . ARF (acute renal failure) (HCC) 11/2017  . Arthritis   . Carotid artery occlusion    Bruit found August 2009  . Hypertension   . Hypothyroidism    Hypothyroidism  . Macular degeneration of left eye   . Multiple falls 12/16/2017  . UTI (urinary tract infection) 12/16/2017    Patient Active Problem List   Diagnosis Date Noted  . Mechanical Fall 12/15/2017  . E coli UTI (urinary tract infection) 12/15/2017  . ARF (acute renal failure) (HCC) 12/15/2017  . Hypertension 12/15/2017  . Hypothyroidism 12/15/2017  . Hyperlipidemia 12/15/2017  . Occlusion and stenosis of carotid artery without mention of cerebral infarction 06/09/2011    Past Surgical History:  Procedure Laterality Date  . APPENDECTOMY    . CAROTID ENDARTERECTOMY  04/14/2008   Right  . CATARACT EXTRACTION    . LUMBAR LAMINECTOMY       OB History   None      Home Medications    Prior to Admission medications   Medication Sig Start Date End Date Taking? Authorizing Provider  aspirin 81 MG  tablet Take 81 mg by mouth daily.    [provider]  atenolol (TENORMIN) 25 MG tablet Take 25 mg by mouth daily.    [provider]  atorvastatin (LIPITOR) 20 MG tablet Take 20 mg by mouth daily.    [provider]  Calcium-Vitamin D (CALTRATE 600 PLUS-VIT D PO) Take 2 tablets by mouth daily.     [provider]  levothyroxine (SYNTHROID, LEVOTHROID) 75 MCG tablet Take 50 mcg by mouth daily before breakfast.     [provider]  lisinopril (PRINIVIL,ZESTRIL) 10 MG tablet Take 10 mg by mouth daily.    [provider]  nystatin-triamcinolone (MYCOLOG II) cream Apply topically 2 (two) times daily. 12/18/17   Shon HaleEmokpae, Courage, MD  ondansetron (ZOFRAN) 4 MG tablet Take 1 tablet (4 mg total) by mouth every 6 (six) hours as needed for nausea. 12/18/17   Shon HaleEmokpae, Courage, MD  OVER THE COUNTER MEDICATION Place 1 drop into both eyes 2 (two) times daily. OTC eye drop for macular degeneration    [provider]    Family History Family History  Problem Relation Age of Onset  . Heart disease Mother        After age 82  . Stroke Mother   . Heart attack Mother   . Hypertension Mother   . Other Mother        Amputation  of Leg- Because of a fall in Nursing Home.    Social History Social History   Tobacco Use  . Smoking status: Never Smoker  . Smokeless tobacco: Never Used  Substance Use Topics  . Alcohol use: No  . Drug use: No     Allergies   Patient has no known allergies.   Review of Systems Review of Systems  All other systems reviewed and are negative.    Physical Exam Updated Vital Signs There were no vitals taken for this visit.  Physical Exam  Constitutional: She is oriented to person, place, and time. She appears well-developed and well-nourished.  HENT:  Head: Normocephalic and atraumatic.  Right Ear: External ear normal.  Left Ear: External ear normal.  Nose: Nose normal.  Mucous membranes appear dry    Eyes: Pupils are equal, round, and reactive to light. EOM are normal.  Neck: Normal range of motion. Neck supple.  Cardiovascular: Normal rate, regular rhythm, normal heart sounds and intact distal pulses.  Pulmonary/Chest: Effort normal and breath sounds normal.  Abdominal: Soft. Bowel sounds are normal.  Musculoskeletal: Normal range of motion.  Neurological: She is alert and oriented to person, place, and time. She displays normal reflexes. No cranial nerve deficit. She exhibits normal muscle tone. Coordination normal.  Skin: Skin is warm and dry. Capillary refill takes less than 2 seconds.  Some early sacral skin breakdown No rashes other wounds noted  Psychiatric: She has a normal mood and affect.  Nursing note and vitals reviewed.    ED Treatments / Results  Labs (all labs ordered are listed, but only abnormal results are displayed) Labs Reviewed  CULTURE, BLOOD (ROUTINE X 2)  CULTURE, BLOOD (ROUTINE X 2)  COMPREHENSIVE METABOLIC PANEL  CBC WITH DIFFERENTIAL/PLATELET  URINALYSIS, ROUTINE W REFLEX MICROSCOPIC  I-STAT CG4 LACTIC ACID, ED    EKG None  Radiology No results found.  Procedures .Critical Care Performed by: Margarita Grizzle, MD Authorized by: Margarita Grizzle, MD   Critical care provider statement:    Critical care time (minutes):  45   Critical care end time:  01/14/2018 8:50 PM   Critical care was necessary to treat or prevent imminent or life-threatening deterioration of the following conditions:  Sepsis   Critical care was time spent personally by me on the following activities:  Discussions with consultants, evaluation of patient's response to treatment, examination of patient, ordering and performing treatments and interventions, ordering and review of laboratory studies, ordering and review of radiographic studies, pulse oximetry, re-evaluation of patient's condition, obtaining history from patient or surrogate and review of old charts   (including  critical care time)  Medications Ordered in ED Medications - No data to display   Initial Impression / Assessment and Plan / ED Course  I have reviewed the triage vital signs and the nursing notes.  Pertinent labs & imaging results that were available during my care of the patient were reviewed by me and considered in my medical decision making (see chart for details).     84 year old female presents today with weakness and fever.  She has been generally weak and was recently discharged from assisted living care facility to home and has been unable to care for herself.  Today she presents with a temp of 101.  Initially heart rate and blood pressure were within normal limits.  However lactic acid is elevated at 4.  Cultures obtained, broad-spectrum antibiotics initiated, and IV fluids initiated. Chest x-Darl Kuss is clear.  Awaiting urinalysis results. Sepsis -  Repeat Assessment  Performed at:   8:48 PM   Vitals     Blood pressure (!) 115/41, pulse 92, resp. rate (!) 28, SpO2 96 %.  Heart:     Regular rate and rhythm  Lungs:    CTA  Capillary Refill:   <2 sec  Peripheral Pulse:   Radial pulse palpable  Skin:     Pale  84 year old female presents today with sepsis.  Blood cultures obtained here.  Chest x-Eljay Lave is clear.  Has only had a few drops of urine on her and now cath.  Normal saline infusing. 9:04 PM Discussed with Dr. Emmit Pomfret and she will see for admission Discussed CODE STATUS with patient she states that she was no extraordinary measures and is DNR at this time  Final Clinical Impressions(s) / ED Diagnoses   Final diagnoses:  Sepsis, due to unspecified organism, unspecified whether acute organ dysfunction present [facility])    ED Discharge Orders    None       Margarita Grizzle, MD 01/14/18 2104

## 2018-01-14 NOTE — ED Notes (Addendum)
4 unsuccessful IV starts from previous staff members. IV team consulted. Will start antibiotics once IV established

## 2018-01-14 NOTE — ED Triage Notes (Signed)
Pt BIB EMS from home. Home health nurse came by and noticed the patient has not been eating/drinking and pt is too weak to get around. [facility] was [facility] Sunday which is 20 days earlier than she was supposed to be. Pt lives at home alone and has a "friend" check on her once in awhile.

## 2018-01-14 NOTE — ED Notes (Signed)
MD AND RN NOTIFIED OF PATIENT'S LACTIC ACID LEVEL OF 4.00

## 2018-01-14 NOTE — ED Notes (Signed)
Bed: WA15 Expected date:  Expected time:  Means of arrival:  Comments: EMS-FTT 

## 2018-01-14 NOTE — ED Notes (Signed)
Pt. Documented in error document vital signs within 1-hr fluid bolus completion and notify provider of bolus completion.

## 2018-01-14 NOTE — ED Notes (Signed)
ED TO INPATIENT HANDOFF REPORT  Name/Age/Gender [name] 84 y.o. female  Code Status Code Status History    Date Active Date Inactive Code Status Order ID Comments User Context   12/15/2017 2342 12/18/2017 1949 Full Code [id number]  Elwyn Reach, MD ED      Home/SNF/Other Home  Chief Complaint fever   Level of Care/Admitting Diagnosis ED Disposition    ED Disposition Condition [facility]: [facility] [[id number]]  Level of Care: Telemetry [5]  Admit to tele based on following criteria: Other see comments  Comments: Sepsis  Diagnosis: Sepsis [facility]) [[id number]]  Admitting Physician: Harvie Bridge [[id number]]  Attending Physician: Harvie Bridge [[id number]]  Estimated length of stay: past midnight tomorrow  Certification:: I certify this patient will need inpatient services for at least 2 midnights  PT Class (Do Not Modify): Inpatient [101]  PT Acc Code (Do Not Modify): Private [1]       Medical History Past Medical History:  Diagnosis Date  . ARF (acute renal failure) ([place]) 11/2017  . Arthritis   . Carotid artery occlusion    Bruit found August 2009  . Hypertension   . Hypothyroidism    Hypothyroidism  . Macular degeneration of left eye   . Multiple falls 12/16/2017  . UTI (urinary tract infection) 12/16/2017    Allergies No Known Allergies  IV Location/Drains/Wounds Patient Lines/Drains/Airways Status   Active Line/Drains/Airways    Name:   Placement date:   Placement time:   Site:   Days:   Peripheral IV 01/14/18 Right Antecubital   01/14/18    1947    Antecubital   less than 1   Peripheral IV 01/14/18 Right;Anterior Forearm   01/14/18    1948    Forearm   less than 1          Labs/Imaging Results for orders placed or performed during the hospital encounter of 01/14/18 (from the past 48 hour(s))  Comprehensive metabolic panel     Status: Abnormal   Collection Time: 01/14/18  6:56 PM  Result Value Ref  Range   Sodium 140 135 - 145 mmol/L   Potassium 3.9 3.5 - 5.1 mmol/L   Chloride 102 98 - 111 mmol/L   CO2 25 22 - 32 mmol/L   Glucose, Bld 134 (H) 70 - 99 mg/dL   BUN 31 (H) 8 - 23 mg/dL   Creatinine, Ser 1.25 (H) 0.44 - 1.00 mg/dL   Calcium 9.4 8.9 - 10.3 mg/dL   Total Protein 8.3 (H) 6.5 - 8.1 g/dL   Albumin 3.9 3.5 - 5.0 g/dL   AST 26 15 - 41 U/L   ALT 12 0 - 44 U/L   Alkaline Phosphatase 86 38 - 126 U/L   Total Bilirubin 1.1 0.3 - 1.2 mg/dL   GFR calc non Af Amer 36 (L) >60 mL/min   GFR calc Af Amer 42 (L) >60 mL/min    Comment: (NOTE) The eGFR has been calculated using the CKD EPI equation. This calculation has not been validated in all clinical situations. eGFR's persistently <60 mL/min signify possible Chronic Kidney Disease.    Anion gap 13 5 - 15    Comment: Performed at [facility], [place] [address] [place], [place] [address]  CBC WITH DIFFERENTIAL     Status: Abnormal   Collection Time: 01/14/18  6:56 PM  Result Value Ref Range   WBC 14.3 (H) 4.0 - 10.5 K/uL   RBC 4.17  3.87 - 5.11 MIL/uL   Hemoglobin 12.2 12.0 - 15.0 g/dL   HCT 40.7 36.0 - 46.0 %   MCV 97.6 80.0 - 100.0 fL   MCH 29.3 26.0 - 34.0 pg   MCHC 30.0 30.0 - 36.0 g/dL   RDW 15.0 11.5 - 15.5 %   Platelets 204 150 - 400 K/uL   nRBC 0.0 0.0 - 0.2 %   Neutrophils Relative % 76 %   Neutro Abs 11.0 (H) 1.7 - 7.7 K/uL   Lymphocytes Relative 12 %   Lymphs Abs 1.7 0.7 - 4.0 K/uL   Monocytes Relative 11 %   Monocytes Absolute 1.6 (H) 0.1 - 1.0 K/uL   Eosinophils Relative 0 %   Eosinophils Absolute 0.0 0.0 - 0.5 K/uL   Basophils Relative 0 %   Basophils Absolute 0.0 0.0 - 0.1 K/uL   Immature Granulocytes 1 %   Abs Immature Granulocytes 0.08 (H) 0.00 - 0.07 K/uL    Comment: Performed at [facility], [place] [address], [place], [place] [address]  I-Stat CG4 Lactic Acid, ED     Status: Abnormal   Collection Time: 01/14/18  6:57 PM  Result Value Ref Range   Lactic Acid,  Venous 4.00 (HH) 0.5 - 1.9 mmol/L   Comment NOTIFIED PHYSICIAN    Dg Chest Port 1 View  Result Date: 01/14/2018 CLINICAL DATA:  Weakness. EXAM: PORTABLE CHEST 1 VIEW COMPARISON:  Chest radiograph 04/10/2008 FINDINGS: Monitoring leads overlie the patient. Stable cardiomegaly. No consolidative pulmonary opacities. No pleural effusion or pneumothorax. Thoracic spine degenerative changes. IMPRESSION: No acute cardiopulmonary process.  Cardiomegaly. Electronically Signed   By: Lovey Newcomer M.D.   On: 01/14/2018 19:30    Pending Labs Unresulted Labs (From admission, onward)    Start     Ordered   01/14/18 1823  Blood Culture (routine x 2)  BLOOD CULTURE X 2,   STAT     01/14/18 1823   01/14/18 1823  Urinalysis, Routine w reflex microscopic  ONCE - STAT,   STAT     01/14/18 1823   Signed and Held  Lactic acid, plasma  STAT Now then every 3 hours,   STAT     Signed and Held   Signed and Held  Protime-INR  ONCE - STAT,   R     Signed and Held   Signed and Held  APTT  ONCE - STAT,   R     Signed and Held   Signed and Held  CBC  (enoxaparin (LOVENOX)    CrCl >/= 30 ml/min)  Once,   R    Comments:  Baseline for enoxaparin therapy IF NOT ALREADY DRAWN.  Notify MD if PLT < 100 K.    Signed and Held   Signed and Held  Creatinine, serum  (enoxaparin (LOVENOX)    CrCl >/= 30 ml/min)  Once,   R    Comments:  Baseline for enoxaparin therapy IF NOT ALREADY DRAWN.    Signed and Held   Signed and Held  Creatinine, serum  (enoxaparin (LOVENOX)    CrCl >/= 30 ml/min)  Weekly,   R    Comments:  while on enoxaparin therapy    Signed and Held   Signed and Held  Magnesium  Add-on,   R     Signed and Held   Signed and Held  Phosphorus  Add-on,   R     Signed and Held   Signed and Held  [employer]  morning,   R     Signed and Held   Signed and Held  Urine culture  Once,   R     Signed and Held   Signed and [employer], sputum-assessment  Once,   R     Signed and Held   Signed and Held  Comprehensive  metabolic panel  Tomorrow morning,   R     Signed and Held          Vitals/Pain Today's Vitals   01/14/18 1827 01/14/18 1900 01/14/18 2030  BP:  (!) 115/41 (!) 137/54  Pulse:  92 92  Resp:  (!) 28 (!) 22  SpO2:  96% 98%  PainSc: 0-No pain      Isolation Precautions No active isolations  Medications Medications  metroNIDAZOLE (FLAGYL) IVPB 500 mg (500 mg Intravenous New Bag/Given 01/14/18 2039)  vancomycin (VANCOCIN) IVPB 1000 mg/200 mL premix (1,000 mg Intravenous New Bag/Given 01/14/18 2047)  sodium chloride 0.9 % bolus 1,000 mL (1,000 mLs Intravenous New Bag/Given 01/14/18 1947)    And  sodium chloride 0.9 % bolus 1,000 mL (1,000 mLs Intravenous New Bag/Given 01/14/18 1951)  ceFEPIme (MAXIPIME) 2 g in sodium chloride 0.9 % 100 mL IVPB (0 g Intravenous Stopped 01/14/18 2043)    Mobility manual wheelchair

## 2018-01-14 NOTE — Progress Notes (Signed)
A consult was received from an ED physician for cefepime and vancomycin per pharmacy dosing.  The patient's profile has been reviewed for ht/wt/allergies/indication/available labs.   A one time order has been placed for cefepime 2 g and vancomycin 1000 mg per MD.  Further antibiotics/pharmacy consults should be ordered by admitting physician if indicated.                       Thank you, Valentina GuChristy, Wyat Infinger D 01/14/2018  7:04 PM

## 2018-01-14 NOTE — Progress Notes (Signed)
Pharmacy Antibiotic Note  [name] is a 84 y.o. female admitted on 01/14/2018 with sepsis.  Pharmacy has been consulted for vancomycin and cefepime dosing.  Plan:  Vancomycin 1000 mg IV x1 in ED, then 500 mg IV q24h   Cefepime 2 gr IV x1 in ED, then cefepime 1 gr IV q24h  Monitor clinical course, renal function, cultures as available      No data recorded.  Recent Labs  Lab 01/14/18 1856 01/14/18 1857  WBC 14.3*  --   CREATININE 1.25*  --   LATICACIDVEN  --  4.00*    CrCl cannot be calculated (Unknown ideal weight.).    No Known Allergies  Antimicrobials this admission: 11/21 vancomycin >>  11/21 cefepime >>   Dose adjustments this admission:   Microbiology results: 11/21 BCx: IP 11/21 UCx:   11/21 Sputum:      Thank you for allowing pharmacy to be a part of this patient's care.   Adalberto ColeNikola Roderick Sweezy, PharmD, BCPS Pager [phone] 01/14/2018 9:44 PM

## 2018-01-14 NOTE — ED Notes (Signed)
Pt placed on purewick to obtain urine sample

## 2018-01-14 NOTE — H&P (Signed)
History and Physical   [facility] [place] [facility] Admission History and Physical [employer], D.O.    Patient Name: [name] MR#: [id number] Date of Birth: 04/09/1924 Date of Admission: 01/14/2018  Referring MD/NP/PA: Dr. Rosalia Hammers Primary Care Physician: Jarrett Soho, PA-C  Chief Complaint:  Chief Complaint  Patient presents with  . Fever    HPI: [name] is a 84 y.o. female with a known history of HTN, hypothyroidism presents to the emergency department for evaluation of fever.  Patient was recently admitted for urinary tract infection and acute kidney injury secondary to rhabdomyolysis after a fall.  She was discharged to [facility] and has since been discharged home.  He was found today by home health sitting in her urine.  Patient denies any complaints to me but per ED report she had complained of chills.  Patient denies fevers/chills, weakness, dizziness, chest pain, shortness of breath, N/V/C/D, abdominal pain, dysuria/frequency, changes in mental status.    Otherwise there has been no change in status. Patient has been taking medication as prescribed and there has been no recent change in medication or diet.  No recent antibiotics.  There has been no recent illness, hospitalizations, travel or sick contacts.    EMS/ED Course: Patient received cefepime, Flagyl, Vanco. Medical admission has been requested for further management of sepsis, unclear source.  Review of Systems:  CONSTITUTIONAL: No fever/chills, fatigue, weakness, weight gain/loss, headache. EYES: No blurry or double vision. ENT: No tinnitus, postnasal drip, redness or soreness of the oropharynx. RESPIRATORY: No cough, dyspnea, wheeze.  No hemoptysis.  CARDIOVASCULAR: No chest pain, palpitations, syncope, orthopnea. No lower extremity edema.  GASTROINTESTINAL: No nausea, vomiting, abdominal pain, diarrhea, constipation.  No hematemesis, melena or hematochezia. GENITOURINARY: No  dysuria, frequency, hematuria. ENDOCRINE: No polyuria or nocturia. No heat or cold intolerance. HEMATOLOGY: No anemia, bruising, bleeding. INTEGUMENTARY: No rashes, ulcers, lesions. MUSCULOSKELETAL: No arthritis, gout, dyspnea. NEUROLOGIC: No numbness, tingling, ataxia, seizure-type activity, weakness. PSYCHIATRIC: No anxiety, depression, insomnia.   Past Medical History:  Diagnosis Date  . ARF (acute renal failure) ([facility]) 11/2017  . Arthritis   . Carotid artery occlusion    Bruit found August 2009  . Hypertension   . Hypothyroidism    Hypothyroidism  . Macular degeneration of left eye   . Multiple falls 12/16/2017  . UTI (urinary tract infection) 12/16/2017    Past Surgical History:  Procedure Laterality Date  . APPENDECTOMY    . CAROTID ENDARTERECTOMY  04/14/2008   Right  . CATARACT EXTRACTION    . LUMBAR LAMINECTOMY       reports that she has never smoked. She has never used smokeless tobacco. She reports that she does not drink alcohol or use drugs.  No Known Allergies  Family History  Problem Relation Age of Onset  . Heart disease Mother        After age 35  . Stroke Mother   . Heart attack Mother   . Hypertension Mother   . Other Mother        Amputation of Leg- Because of a fall in Nursing Home.    Prior to Admission medications   Medication Sig Start Date End Date Taking? Authorizing Provider  aspirin 81 MG tablet Take 81 mg by mouth daily.   Yes [provider]  atenolol (TENORMIN) 25 MG tablet Take 25 mg by mouth daily.   Yes [provider]  atorvastatin (LIPITOR) 20 MG tablet Take 20 mg by mouth daily.  Yes [provider]  Calcium-Vitamin D (CALTRATE 600 PLUS-VIT D PO) Take 2 tablets by mouth daily.    Yes [provider]  levothyroxine (SYNTHROID, LEVOTHROID) 75 MCG tablet Take 75 mcg by mouth daily before breakfast.    Yes [provider]  lisinopril (PRINIVIL,ZESTRIL) 10 MG tablet Take 10 mg by mouth  daily.   Yes [provider]  OVER THE COUNTER MEDICATION Place 1 drop into both eyes 2 (two) times daily. OTC eye drop for macular degeneration   Yes [provider]  OVER THE COUNTER MEDICATION Take 1-2 capsules by mouth daily.   Yes [provider]  OVER THE COUNTER MEDICATION Take 1 tablet by mouth daily.   Yes [provider]  nystatin-triamcinolone (MYCOLOG II) cream Apply topically 2 (two) times daily. Patient not taking: Reported on 01/14/2018 12/18/17   Shon Hale, MD  ondansetron (ZOFRAN) 4 MG tablet Take 1 tablet (4 mg total) by mouth every 6 (six) hours as needed for nausea. Patient not taking: Reported on 01/14/2018 12/18/17   Shon Hale, MD    Physical Exam: Vitals:   01/14/18 1900 01/14/18 2030  BP: (!) 115/41 (!) 137/54  Pulse: 92 92  Resp: (!) 28 (!) 22  SpO2: 96% 98%    GENERAL: 84 y.o.-year-old female patient, well-developed, well-nourished lying in the bed in no acute distress.  Pleasant and cooperative.   HEENT: Head atraumatic, normocephalic. Pupils equal. Mucus membranes dry NECK: Supple. No JVD. CHEST: Normal breath sounds bilaterally. No wheezing, rales, rhonchi or crackles. No use of accessory muscles of respiration.  No reproducible chest wall tenderness.  CARDIOVASCULAR: S1, S2 normal. No murmurs, rubs, or gallops. Cap refill <2 seconds. Pulses intact distally.  ABDOMEN: Soft, nondistended, nontender. No rebound, guarding, rigidity. Normoactive bowel sounds present in all four quadrants.  EXTREMITIES: No pedal edema, cyanosis, or clubbing. No calf tenderness or Homan's sign.  NEUROLOGIC: The patient is alert and oriented x 3. Cranial nerves II through XII are grossly intact with no focal sensorimotor deficit. PSYCHIATRIC:  Normal affect, mood, thought content. SKIN: Warm, dry, and intact without obvious rash, lesion, or ulcer.    Labs on Admission:  CBC: Recent Labs  Lab 01/14/18 1856  WBC 14.3*   NEUTROABS 11.0*  HGB 12.2  HCT 40.7  MCV 97.6  PLT 204   Basic Metabolic Panel: Recent Labs  Lab 01/14/18 1856  NA 140  K 3.9  CL 102  CO2 25  GLUCOSE 134*  BUN 31*  CREATININE 1.25*  CALCIUM 9.4   GFR: CrCl cannot be calculated (Unknown ideal weight.). Liver Function Tests: Recent Labs  Lab 01/14/18 1856  AST 26  ALT 12  ALKPHOS 86  BILITOT 1.1  PROT 8.3*  ALBUMIN 3.9   No results for input(s): LIPASE, AMYLASE in the last 168 hours. No results for input(s): AMMONIA in the last 168 hours. Coagulation Profile: No results for input(s): INR, PROTIME in the last 168 hours. Cardiac Enzymes: No results for input(s): CKTOTAL, CKMB, CKMBINDEX, TROPONINI in the last 168 hours. BNP (last 3 results) No results for input(s): PROBNP in the last 8760 hours. HbA1C: No results for input(s): HGBA1C in the last 72 hours. CBG: No results for input(s): GLUCAP in the last 168 hours. Lipid Profile: No results for input(s): CHOL, HDL, LDLCALC, TRIG, CHOLHDL, LDLDIRECT in the last 72 hours. Thyroid Function Tests: No results for input(s): TSH, T4TOTAL, FREET4, T3FREE, THYROIDAB in the last 72 hours. Anemia Panel: No results for input(s): VITAMINB12, FOLATE,  FERRITIN, TIBC, IRON, RETICCTPCT in the last 72 hours. Urine analysis:    Component Value Date/Time   COLORURINE YELLOW 12/15/2017 1658   APPEARANCEUR HAZY (A) 12/15/2017 1658   LABSPEC 1.016 12/15/2017 1658   PHURINE 5.0 12/15/2017 1658   GLUCOSEU NEGATIVE 12/15/2017 1658   HGBUR LARGE (A) 12/15/2017 1658   BILIRUBINUR NEGATIVE 12/15/2017 1658   KETONESUR NEGATIVE 12/15/2017 1658   PROTEINUR 30 (A) 12/15/2017 1658   UROBILINOGEN 0.2 04/10/2008 1519   NITRITE NEGATIVE 12/15/2017 1658   LEUKOCYTESUR LARGE (A) 12/15/2017 1658   Sepsis Labs: @LABRCNTIP (procalcitonin:4,lacticidven:4) )No results found for this or any previous visit (from the past 240 hour(s)).   Radiological Exams on Admission: Dg Chest Port 1  View  Result Date: 01/14/2018 CLINICAL DATA:  Weakness. EXAM: PORTABLE CHEST 1 VIEW COMPARISON:  Chest radiograph 04/10/2008 FINDINGS: Monitoring leads overlie the patient. Stable cardiomegaly. No consolidative pulmonary opacities. No pleural effusion or pneumothorax. Thoracic spine degenerative changes. IMPRESSION: No acute cardiopulmonary process.  Cardiomegaly. Electronically Signed   By: Annia Beltrew  Davis M.D.   On: 01/14/2018 19:30     Assessment/Plan  This is a 84 y.o. female with a history of HTN, hypothyroidism now being admitted with:  #. Sepsis, unclear source but presumable secondary to UTI - Admit to inpatient with telemetry monitoring - IV antibiotics:  Cefepime, Flagyl, Vanco - IV fluid hydration - Follow up blood,urine & sputum cultures - Repeat CBC in am.    #. Acute kidney injury  - IV fluids and repeat BMP in AM.  - Avoid nephrotoxic medications - Bladder scan and place foley catheter if evidence of urinary retention - Check renal US  Admission status: Inpatient IV Fluids: Normal saline Diet/Nutrition: Heart healthy Consults called: None DVT Px: Lovenox, SCDs and early ambulation. Code Status: DNR Disposition Plan: To home in 1-2 days  All the records are reviewed and case discussed with ED provider. Management plans discussed with the patient and/or family who express understanding and agree with plan of care.  Parker Sawatzky D.O. on 01/14/2018 at 9:06 PM CC: Primary care physician; Jarrett SohoWharton, Courtney, PA-C   01/14/2018, 9:06 PM

## 2018-01-14 NOTE — ED Notes (Signed)
Attempted to In-and-Out pt with zero output. Ray, MD notified

## 2018-01-15 DIAGNOSIS — N179 Acute kidney failure, unspecified: Secondary | ICD-10-CM

## 2018-01-15 DIAGNOSIS — L89152 Pressure ulcer of sacral region, stage 2: Secondary | ICD-10-CM | POA: Insufficient documentation

## 2018-01-15 DIAGNOSIS — A419 Sepsis, unspecified organism: Secondary | ICD-10-CM | POA: Diagnosis not present

## 2018-01-15 LAB — COMPREHENSIVE METABOLIC PANEL
ALBUMIN: 2.7 g/dL — AB (ref 3.5–5.0)
ALT: 10 U/L (ref 0–44)
ANION GAP: 7 (ref 5–15)
AST: 16 U/L (ref 15–41)
Alkaline Phosphatase: 64 U/L (ref 38–126)
BUN: 26 mg/dL — AB (ref 8–23)
CHLORIDE: 111 mmol/L (ref 98–111)
CO2: 22 mmol/L (ref 22–32)
Calcium: 7.9 mg/dL — ABNORMAL LOW (ref 8.9–10.3)
Creatinine, Ser: 1 mg/dL (ref 0.44–1.00)
GFR calc Af Amer: 55 mL/min — ABNORMAL LOW (ref >60)
GFR calc non Af Amer: 48 mL/min — ABNORMAL LOW (ref >60)
GLUCOSE: 118 mg/dL — AB (ref 70–99)
POTASSIUM: 3.7 mmol/L (ref 3.5–5.1)
SODIUM: 140 mmol/L (ref 135–145)
Total Bilirubin: 0.6 mg/dL (ref 0.3–1.2)
Total Protein: 6 g/dL — ABNORMAL LOW (ref 6.5–8.1)

## 2018-01-15 LAB — CG4 I-STAT (LACTIC ACID): Lactic Acid, Venous: 0.33 mmol/L — ABNORMAL LOW (ref 0.5–1.9)

## 2018-01-15 LAB — LACTIC ACID, PLASMA: LACTIC ACID, VENOUS: 1.2 mmol/L (ref 0.5–1.9)

## 2018-01-15 LAB — CBC
HEMATOCRIT: 32.5 % — AB (ref 36.0–46.0)
Hemoglobin: 9.8 g/dL — ABNORMAL LOW (ref 12.0–15.0)
MCH: 30.4 pg (ref 26.0–34.0)
MCHC: 30.2 g/dL (ref 30.0–36.0)
MCV: 100.9 fL — ABNORMAL HIGH (ref 80.0–100.0)
PLATELETS: 130 10*3/uL — AB (ref 150–400)
RBC: 3.22 MIL/uL — AB (ref 3.87–5.11)
RDW: 14.9 % (ref 11.5–15.5)
WBC: 11.9 10*3/uL — AB (ref 4.0–10.5)
nRBC: 0 % (ref 0.0–0.2)

## 2018-01-15 MED ORDER — IPRATROPIUM-ALBUTEROL 0.5-2.5 (3) MG/3ML IN SOLN: 3.0000 mL | Freq: Four times a day (QID) | RESPIRATORY_TRACT | Status: DC | PRN

## 2018-01-15 MED ORDER — SODIUM CHLORIDE 0.9 % IV SOLN
1.0000 g | INTRAVENOUS | Status: DC
  Administered 2018-01-15 – 2018-01-18 (×4): 1 g via INTRAVENOUS
  Filled 2018-01-15 (×4): qty 1

## 2018-01-15 NOTE — Progress Notes (Signed)
[facility] PROGRESS NOTE    Progress Note  [name]  UEA:[id number] DOB: 1924-09-18 DOA: 01/14/2018 PCP: Jarrett Soho, PA-C     Brief Narrative:   [name] is an 84 y.o. female past medical history of hypertension recently admitted for urinary tract infection and acute renal failure secondary to rhabdomyolysis after fall presents to the emergency room for evaluation of fever, was found by family sitting in her urine confused  Assessment/Plan:   Sepsis [facility]): Of unclear source, urine culture is pending there was not a UA done on admission. Presumably urinary source, discontinue IV vanc, cefepime and Flagyl, start IV Rocephin. Blood cultures and urine cultures are pending. She has been fluid resuscitated vitals have improved his lactic acidosis has resolved. She is remained afebrile leukocytosis improving. Consult physical therapy.  Acute kidney injury: Likely prerenal azotemia improved with IV fluid hydration.  Stage II sacral decub ulcer: Partial thickness loss of the dermis with red-pink wound without sloughing, this was present on admission. Consult wound care    DVT prophylaxis: lovenox Family Communication:none Disposition Plan/Barrier to D/C: SNF in 1-2 days Code Status:     Code Status Orders  (From admission, onward)         Start     Ordered   01/14/18 2245  Full code  Continuous     01/14/18 2245        Code Status History    Date Active Date Inactive Code Status Order ID Comments User Context   12/15/2017 2342 12/18/2017 1949 Full Code [id number]  Rometta Emery, MD ED        IV Access:    Peripheral IV   Procedures and diagnostic studies:   Dg Chest Port 1 View  Result Date: 01/14/2018 CLINICAL DATA:  Weakness. EXAM: PORTABLE CHEST 1 VIEW COMPARISON:  Chest radiograph 04/10/2008 FINDINGS: Monitoring leads overlie the patient. Stable cardiomegaly. No consolidative pulmonary opacities. No pleural effusion or pneumothorax.  Thoracic spine degenerative changes. IMPRESSION: No acute cardiopulmonary process.  Cardiomegaly. Electronically Signed   By: Annia Belt M.D.   On: 01/14/2018 19:30     Medical Consultants:    None.  Anti-Infectives:   IV rocephin  Subjective:    Mellody Life she relates she has diffuse pain all over.  Which is what is limited her ability to walk around.  Objective:    Vitals:   01/14/18 2225 01/15/18 0015 01/15/18 0209 01/15/18 0545  BP: 109/79  (!) 129/50 (!) 107/41  Pulse: 86  87 79  Resp: 20   18  Temp: 99.1 F (37.3 C) 98.6 F (37 C) 98.4 F (36.9 C) 98.5 F (36.9 C)  TempSrc: Oral Oral Oral Oral  SpO2: 96%  92% 95%  Weight: 60.5 kg     Height: 5\' 4"  (1.626 m)       Intake/Output Summary (Last 24 hours) at 01/15/2018 0744 Last data filed at 01/15/2018 0524 Gross per 24 hour  Intake 2000 ml  Output -  Net 2000 ml   Filed Weights   01/14/18 2225  Weight: 60.5 kg    Exam: General exam: In no acute distress. Respiratory system: Good air movement and clear to auscultation. Cardiovascular system: S1 & S2 heard, RRR. Gastrointestinal system: Abdomen is nondistended, soft and nontender.  Central nervous system: Alert and oriented. No focal neurological deficits. Extremities: No pedal edema. Skin: No rashes, lesions or ulcers Psychiatry: Judgement and insight appear normal. Mood & affect appropriate.    Data Reviewed:  Labs: Basic Metabolic Panel: Recent Labs  Lab 01/14/18 1856 01/14/18 2305 01/15/18 0146  NA 140  --  140  K 3.9  --  3.7  CL 102  --  111  CO2 25  --  22  GLUCOSE 134*  --  118*  BUN 31*  --  26*  CREATININE 1.25*  --  1.00  CALCIUM 9.4  --  7.9*  MG  --  1.9  --   PHOS  --  2.2*  --    GFR Estimated Creatinine Clearance: 31.6 mL/min (by C-G formula based on SCr of 1 mg/dL). Liver Function Tests: Recent Labs  Lab 01/14/18 1856 01/15/18 0146  AST 26 16  ALT 12 10  ALKPHOS 86 64  BILITOT 1.1 0.6  PROT 8.3* 6.0*    ALBUMIN 3.9 2.7*   No results for input(s): LIPASE, AMYLASE in the last 168 hours. No results for input(s): AMMONIA in the last 168 hours. Coagulation profile Recent Labs  Lab 01/14/18 2305  INR 1.01    CBC: Recent Labs  Lab 01/14/18 1856 01/15/18 0146  WBC 14.3* 11.9*  NEUTROABS 11.0*  --   HGB 12.2 9.8*  HCT 40.7 32.5*  MCV 97.6 100.9*  PLT 204 130*   Cardiac Enzymes: No results for input(s): CKTOTAL, CKMB, CKMBINDEX, TROPONINI in the last 168 hours. BNP (last 3 results) No results for input(s): PROBNP in the last 8760 hours. CBG: No results for input(s): GLUCAP in the last 168 hours. D-Dimer: No results for input(s): DDIMER in the last 72 hours. Hgb A1c: No results for input(s): HGBA1C in the last 72 hours. Lipid Profile: No results for input(s): CHOL, HDL, LDLCALC, TRIG, CHOLHDL, LDLDIRECT in the last 72 hours. Thyroid function studies: No results for input(s): TSH, T4TOTAL, T3FREE, THYROIDAB in the last 72 hours.  Invalid input(s): FREET3 Anemia work up: No results for input(s): VITAMINB12, FOLATE, FERRITIN, TIBC, IRON, RETICCTPCT in the last 72 hours. Sepsis Labs: Recent Labs  Lab 01/14/18 1856 01/14/18 1857 01/14/18 2305 01/15/18 0146  WBC 14.3*  --   --  11.9*  LATICACIDVEN  --  4.00* 1.9 1.2   Microbiology No results found for this or any previous visit (from the past 240 hour(s)).   Medications:   . aspirin EC  81 mg Oral Daily  . atenolol  25 mg Oral Daily  . atorvastatin  20 mg Oral Daily  . enoxaparin (LOVENOX) injection  30 mg Subcutaneous QHS  . levothyroxine  75 mcg Oral QAC breakfast   Continuous Infusions: . ceFEPime (MAXIPIME) IV    . metronidazole 500 mg (01/15/18 0524)  . sodium chloride    . vancomycin       LOS: 1 day   Marinda ElkAbraham Feliz Ortiz  [facility]   *Please refer to [email or web address], password [email or web address] to get updated schedule on who will round on this patient, as hospitalists switch teams weekly. If 7PM-7AM, please  contact night-coverage at [email or web address], password [email or web address] for any overnight needs.  01/15/2018, 7:44 AM

## 2018-01-15 NOTE — Evaluation (Signed)
Physical Therapy Evaluation Patient Details Name: [name] MRN: [id number] DOB: Aug 31, 1924 Today's Date: 01/15/2018   History of Present Illness  84 y.o. female past medical history of hypertension recently admitted for urinary tract infection and acute renal failure secondary to rhabdomyolysis after fall presents to the emergency room for evaluation of fever, was found by family sitting in her urine confused, diagnosed with sepsis likely due to UTI  Clinical Impression  Pt admitted with above diagnosis. Pt currently with functional limitations due to the deficits listed below (see PT Problem List).  Pt will benefit from skilled PT to increase their independence and safety with mobility to allow discharge to the venue listed below.   Pt's evaluation limited by pain of multiple joints, which she reports likely due to arthritis.  Pt reports recent d/c home from SNF however she reports she was supposed to have a few more weeks of therapy but they "cut [her] off " from therapy and sent her home.  Pt reports she was able to stand with assist and ambulate short distance upon d/c home however states she was having trouble getting OOB at home and was not active.  Recommend SNF upon d/c.  If home, pt will likely need increased assist (aide if possible, home care) and wheel chair to assist with safely mobilizing as pt reports multiple falls this year.     Follow Up Recommendations SNF    Equipment Recommendations  Wheelchair cushion (measurements PT);Wheelchair (measurements PT)(if home)    Recommendations for Other Services       Precautions / Restrictions Precautions Precautions: Fall      Mobility  Bed Mobility Overal bed mobility: Needs Assistance             General bed mobility comments: unable to attempt due to pain however would appear total assist due to weakness and pain  Transfers                    Ambulation/Gait                Stairs             Wheelchair Mobility    Modified Rankin (Stroke Patients Only)       Balance                                             Pertinent Vitals/Pain Pain Assessment: 0-10 Pain Score: 10-Worst pain ever Pain Location: joints Pain Descriptors / Indicators: Aching;Discomfort;Sore Pain Intervention(s): Limited activity within patient's tolerance;Repositioned;Monitored during session;Patient requesting pain meds-RN notified    Home Living Family/patient expects to be discharged to:: Unsure Living Arrangements: Alone               Additional Comments: recently at SNF but discharged home, neighbors check on pt    Prior Function Level of Independence: Needs assistance   Gait / Transfers Assistance Needed: pt reports she had to return home from SNF due to payment, was able to ambulate short distance with RW upon d/c home from SNF however reports not being OOB in days leading up to admission           Hand Dominance        Extremity/Trunk Assessment   Upper Extremity Assessment Upper Extremity Assessment: Generalized weakness;LUE deficits/detail LUE Deficits / Details: not able to tolerate PROM of L shoulder, bil  hands DIP joint deformities    Lower Extremity Assessment Lower Extremity Assessment: Generalized weakness;LLE deficits/detail(pt unable to tolerate PROM of LEs due to pain) LLE Deficits / Details: reports chronic L knee pain and weakness after fx of fall years ago LLE: Unable to fully assess due to pain       Communication   Communication: No difficulties  Cognition Arousal/Alertness: Awake/alert Behavior During Therapy: WFL for tasks assessed/performed Overall Cognitive Status: Within Functional Limits for tasks assessed                                        General Comments      Exercises     Assessment/Plan    PT Assessment Patient needs continued PT services  PT Problem List Decreased strength;Decreased  mobility;Decreased balance;Decreased activity tolerance;Decreased knowledge of use of DME       PT Treatment Interventions DME instruction;Therapeutic activities;Gait training;Therapeutic exercise;Patient/family education;Functional mobility training;Balance training;Wheelchair mobility training    PT Goals (Current goals can be found in the Care Plan section)  Acute Rehab PT Goals PT Goal Formulation: With patient Time For Goal Achievement: 01/29/18 Potential to Achieve Goals: Good    Frequency Min 2X/week   Barriers to discharge        Co-evaluation               AM-PAC PT "6 Clicks" Daily Activity  Outcome Measure Difficulty turning over in bed (including adjusting bedclothes, sheets and blankets)?: Unable Difficulty moving from lying on back to sitting on the side of the bed? : Unable Difficulty sitting down on and standing up from a chair with arms (e.g., wheelchair, bedside commode, etc,.)?: Unable Help needed moving to and from a bed to chair (including a wheelchair)?: Total Help needed walking in hospital room?: Total Help needed climbing 3-5 steps with a railing? : Total 6 Click Score: 6    End of Session   Activity Tolerance: Patient tolerated treatment well Patient left: with call bell/phone within reach;in bed;with bed alarm set Nurse Communication: Mobility status;Patient requests pain meds PT Visit Diagnosis: Difficulty in walking, not elsewhere classified (R26.2);Other abnormalities of gait and mobility (R26.89);Muscle weakness (generalized) (M62.81)    Time: 1610-9604 PT Time Calculation (min) (ACUTE ONLY): 12 min   Charges:   PT Evaluation $PT Eval Low Complexity: 1 Low         Zenovia Jarred, PT, DPT Acute Rehabilitation Services Office: [phone] Pager: [phone]  Sarajane Jews 01/15/2018, 1:36 PM

## 2018-01-15 NOTE — Plan of Care (Signed)
  Problem: Education: Goal: Knowledge of General Education information will improve Description: Including pain rating scale, medication(s)/side effects and non-pharmacologic comfort measures Outcome: Progressing   Problem: Clinical Measurements: Goal: Ability to maintain clinical measurements within normal limits will improve Outcome: Progressing Goal: Will remain free from infection Outcome: Progressing Goal: Diagnostic test results will improve Outcome: Progressing Goal: Respiratory complications will improve Outcome: Progressing Goal: Cardiovascular complication will be avoided Outcome: Progressing   Problem: Activity: Goal: Risk for activity intolerance will decrease Outcome: Progressing   Problem: Nutrition: Goal: Adequate nutrition will be maintained Outcome: Progressing   Problem: Coping: Goal: Level of anxiety will decrease Outcome: Progressing   Problem: Elimination: Goal: Will not experience complications related to urinary retention Outcome: Progressing   Problem: Pain Managment: Goal: General experience of comfort will improve Outcome: Progressing   Problem: Safety: Goal: Ability to remain free from injury will improve Outcome: Progressing   Problem: Skin Integrity: Goal: Risk for impaired skin integrity will decrease Outcome: Progressing   

## 2018-01-15 NOTE — NC FL2 (Signed)
[place] MEDICAID FL2 LEVEL OF CARE SCREENING TOOL     IDENTIFICATION  Patient Name: [name] Birthdate: [birth year] Sex: female Admission Date (Current Location): 01/14/2018  [facility] [place]   Facility and Address:  [facility],  [address], [place] [id number]      Provider Number: [id number]  Attending Physician Name and Address:  Marinda ElkFeliz Ortiz, Abraham, MD  Relative Name and Phone Number:       Current Level of Care: Hospital Recommended Level of Care: Skilled Nursing Facility Prior Approval Number:    Date Approved/Denied:   PASRR Number: [id number]  Discharge Plan:      Current Diagnoses: Patient Active Problem List   Diagnosis Date Noted  . Stage II pressure ulcer of sacral region (HCC) 01/15/2018  . AKI (acute kidney injury) (HCC) 01/15/2018  . Sepsis (HCC) 01/14/2018  . Mechanical Fall 12/15/2017  . E coli UTI (urinary tract infection) 12/15/2017  . ARF (acute renal failure) (HCC) 12/15/2017  . Hypertension 12/15/2017  . Hypothyroidism 12/15/2017  . Hyperlipidemia 12/15/2017  . Occlusion and stenosis of carotid artery without mention of cerebral infarction 06/09/2011    Orientation RESPIRATION BLADDER Height & Weight     Self, Time, Situation, Place  Normal External catheter, Continent Weight: 133 lb 6.1 oz (60.5 kg) Height:  5\' 4"  (162.6 cm)  BEHAVIORAL SYMPTOMS/MOOD NEUROLOGICAL BOWEL NUTRITION STATUS      Continent Diet(heart healthy)  AMBULATORY STATUS COMMUNICATION OF NEEDS Skin   Extensive Assist Verbally Normal                       Personal Care Assistance Level of Assistance  Bathing, Feeding, Dressing Bathing Assistance: Maximum assistance Feeding assistance: Limited assistance Dressing Assistance: Maximum assistance     Functional Limitations Info  Sight, Hearing, Speech Sight Info: Adequate Hearing Info: Adequate Speech Info: Adequate    SPECIAL CARE FACTORS FREQUENCY  OT (By  licensed OT), PT (By licensed PT)     PT Frequency: 5x wk OT Frequency: 5x wk            Contractures Contractures Info: Not present    Additional Factors Info  Code Status, Allergies Code Status Info: Full Code Allergies Info: No known allergies           Current Medications (01/15/2018):  This is the current hospital active medication list Current Facility-Administered Medications  Medication Dose Route Frequency Provider Last Rate Last Dose  . acetaminophen (TYLENOL) tablet 650 mg  650 mg Oral Q6H PRN Hugelmeyer, Alexis, DO       Or  . acetaminophen (TYLENOL) suppository 650 mg  650 mg Rectal Q6H PRN Hugelmeyer, Alexis, DO      . aspirin EC tablet 81 mg  81 mg Oral Daily Hugelmeyer, Alexis, DO   81 mg at 01/15/18 1017  . atenolol (TENORMIN) tablet 25 mg  25 mg Oral Daily Hugelmeyer, Alexis, DO   25 mg at 01/15/18 1016  . atorvastatin (LIPITOR) tablet 20 mg  20 mg Oral Daily Hugelmeyer, Alexis, DO   20 mg at 01/15/18 1017  . bisacodyl (DULCOLAX) EC tablet 5 mg  5 mg Oral Daily PRN Hugelmeyer, Alexis, DO      . cefTRIAXone (ROCEPHIN) 1 g in sodium chloride 0.9 % 100 mL IVPB  1 g Intravenous Q24H Marinda ElkFeliz Ortiz, Abraham, MD 200 mL/hr at 01/15/18 1015 1 g at 01/15/18 1015  . enoxaparin (LOVENOX) injection 30 mg  30 mg Subcutaneous QHS  Hugelmeyer, Alexis, DO   30 mg at 01/15/18 0105  . ipratropium-albuterol (DUONEB) 0.5-2.5 (3) MG/3ML nebulizer solution 3 mL  3 mL Nebulization Q6H PRN Marinda Elk, MD      . levothyroxine (SYNTHROID, LEVOTHROID) tablet 75 mcg  75 mcg Oral QAC breakfast Hugelmeyer, Alexis, DO   75 mcg at 01/15/18 0519  . magnesium citrate solution 1 Bottle  1 Bottle Oral Once PRN Hugelmeyer, Alexis, DO      . ondansetron (ZOFRAN) tablet 4 mg  4 mg Oral Q6H PRN Hugelmeyer, Alexis, DO       Or  . ondansetron (ZOFRAN) injection 4 mg  4 mg Intravenous Q6H PRN Hugelmeyer, Alexis, DO      . oxyCODONE (Oxy IR/ROXICODONE) immediate release tablet 5 mg  5 mg Oral Q4H  PRN Hugelmeyer, Alexis, DO   5 mg at 01/15/18 0015  . senna-docusate (Senokot-S) tablet 1 tablet  1 tablet Oral QHS PRN Hugelmeyer, Alexis, DO      . sodium chloride 0.9 % bolus 1,000 mL  1,000 mL Intravenous Once Hugelmeyer, Alexis, DO         Discharge Medications: Please see discharge summary for a list of discharge medications.  Relevant Imaging Results:  Relevant Lab Results:   Additional Information SSN: [id number] [place]

## 2018-01-15 NOTE — Plan of Care (Signed)
  Problem: Education: Goal: Knowledge of General Education information will improve Description Including pain rating scale, medication(s)/side effects and non-pharmacologic comfort measures Outcome: Progressing   Problem: Clinical Measurements: Goal: Diagnostic test results will improve Outcome: Progressing Goal: Respiratory complications will improve Outcome: Progressing   Problem: Activity: Goal: Risk for activity intolerance will decrease Outcome: Progressing   Problem: Nutrition: Goal: Adequate nutrition will be maintained Outcome: Progressing   Problem: Elimination: Goal: Will not experience complications related to urinary retention Outcome: Progressing   Problem: Safety: Goal: Ability to remain free from injury will improve Outcome: Progressing   Problem: Skin Integrity: Goal: Risk for impaired skin integrity will decrease Outcome: Progressing

## 2018-01-15 NOTE — Care Management Important Message (Signed)
Important Message  Patient Details  Name: [name] MRN: [id number] Date of Birth: 10/15/1924   Medicare Important Message Given:  Yes    Caren MacadamFuller, Jameal Razzano 01/15/2018, 11:57 AMImportant Message  Patient Details  Name: [name] MRN: [id number] Date of Birth: 10/15/1924   Medicare Important Message Given:  Yes    Caren MacadamFuller, Westlee Devita 01/15/2018, 11:57 AM

## 2018-01-16 DIAGNOSIS — A419 Sepsis, unspecified organism: Secondary | ICD-10-CM | POA: Diagnosis not present

## 2018-01-16 DIAGNOSIS — N179 Acute kidney failure, unspecified: Secondary | ICD-10-CM | POA: Diagnosis not present

## 2018-01-16 DIAGNOSIS — L89152 Pressure ulcer of sacral region, stage 2: Secondary | ICD-10-CM | POA: Diagnosis not present

## 2018-01-16 LAB — CBC WITH DIFFERENTIAL/PLATELET
ABS IMMATURE GRANULOCYTES: 0.04 10*3/uL (ref 0.00–0.07)
BASOS ABS: 0 10*3/uL (ref 0.0–0.1)
Basophils Relative: 0 %
Eosinophils Absolute: 0.2 10*3/uL (ref 0.0–0.5)
Eosinophils Relative: 3 %
HEMATOCRIT: 30.5 % — AB (ref 36.0–46.0)
HEMOGLOBIN: 9.4 g/dL — AB (ref 12.0–15.0)
Immature Granulocytes: 1 %
LYMPHS ABS: 1.4 10*3/uL (ref 0.7–4.0)
LYMPHS PCT: 17 %
MCH: 30.5 pg (ref 26.0–34.0)
MCHC: 30.8 g/dL (ref 30.0–36.0)
MCV: 99 fL (ref 80.0–100.0)
MONO ABS: 0.9 10*3/uL (ref 0.1–1.0)
Monocytes Relative: 11 %
NEUTROS ABS: 6.1 10*3/uL (ref 1.7–7.7)
Neutrophils Relative %: 68 %
Platelets: 131 10*3/uL — ABNORMAL LOW (ref 150–400)
RBC: 3.08 MIL/uL — AB (ref 3.87–5.11)
RDW: 14.6 % (ref 11.5–15.5)
WBC: 8.7 10*3/uL (ref 4.0–10.5)
nRBC: 0 % (ref 0.0–0.2)

## 2018-01-16 LAB — URINE CULTURE: Culture: <10000 — AB

## 2018-01-16 MED ORDER — VANCOMYCIN HCL 500 MG IV SOLR
500.0000 mg | INTRAVENOUS | Status: DC
  Administered 2018-01-16: 500 mg via INTRAVENOUS
  Filled 2018-01-16 (×2): qty 500

## 2018-01-16 NOTE — Progress Notes (Signed)
Pharmacy Antibiotic Note  [name] is a 84 y.o. female admitted on 01/14/2018 with sepsis.  Pharmacy has been consulted for vancomycin and cefepime dosing.  Plan:  Vancomycin 1000 mg IV x1 given on 11/21 and vancomycin then stopped.  Vancomycin restarted 11/23, continue vancomycin 500 mg IV q24h as previously ordered   Cefepime 2 gr IV x1 in ED, then cefepime 1 gr IV q24h  Monitor clinical course, renal function, cultures as available   Height: 5\' 4"  (162.6 cm) Weight: 133 lb 6.1 oz (60.5 kg) IBW/kg (Calculated) : 54.7  Temp (24hrs), Avg:99.8 F (37.7 C), Min:99.3 F (37.4 C), Max:100.5 F (38.1 C)  Recent Labs  Lab 01/14/18 1856 01/14/18 1857 01/14/18 2206 01/14/18 2305 01/15/18 0146  WBC 14.3*  --   --   --  11.9*  CREATININE 1.25*  --   --   --  1.00  LATICACIDVEN  --  4.00* 0.33* 1.9 1.2    Estimated Creatinine Clearance: 31.6 mL/min (by C-G formula based on SCr of 1 mg/dL).    No Known Allergies  Antimicrobials this admission: 11/21 vancomycin >> 11/22, 11/23>>  11/21 cefepime x1 11/22 Rocephin>>  Dose adjustments this admission:   Microbiology results: 11/21 BCx: NGTD 11/21 UCx:  <10k colonies  11/21 Sputum:      Thank you for allowing pharmacy to be a part of this patient's care.   [name], PharmD, BCPS Pager [phone] 01/16/2018 10:06 AM

## 2018-01-16 NOTE — Progress Notes (Signed)
[facility] PROGRESS NOTE    Progress Note  Tamara LifeMary Deloney  [id number] DOB: Jul 16, 1924 DOA: 01/14/2018 PCP: Jarrett SohoWharton, Courtney, PA-C     Brief Narrative:   [name] is an 84 y.o. female past medical history of hypertension recently admitted for urinary tract infection and acute renal failure secondary to rhabdomyolysis after fall presents to the emergency room for evaluation of fever, was found by family sitting in her urine confused  Assessment/Plan:   Sepsis [facility](HCC): Of unclear source, urine culture is pending there was not a UA done on admission. After de-escalation of antibiotics she started having fevers, will continue Rocephin add IV vancomycin. Blood cultures negative till date and urine cultures less than 10,000 colonies Physical therapy evaluated the patient recommended skilled nursing facility.  Acute kidney injury: Likely prerenal azotemia improved with IV fluid hydration.  Stage I sacral decub ulcer: Partial thickness loss of the dermis with red-pink wound without sloughing, this was present on admission. Consult wound care    DVT prophylaxis: lovenox Family Communication:none Disposition Plan/Barrier to D/C: SNF in 1-2 days Code Status:     Code Status Orders  (From admission, onward)         Start     Ordered   01/14/18 2245  Full code  Continuous     01/14/18 2245        Code Status History    Date Active Date Inactive Code Status Order ID Comments User Context   12/15/2017 2342 12/18/2017 1949 Full Code [id number]  Rometta EmeryGarba, Mohammad L, MD ED        IV Access:    Peripheral IV   Procedures and diagnostic studies:   Dg Chest Port 1 View  Result Date: 01/14/2018 CLINICAL DATA:  Weakness. EXAM: PORTABLE CHEST 1 VIEW COMPARISON:  Chest radiograph 04/10/2008 FINDINGS: Monitoring leads overlie the patient. Stable cardiomegaly. No consolidative pulmonary opacities. No pleural effusion or pneumothorax. Thoracic spine degenerative changes.  IMPRESSION: No acute cardiopulmonary process.  Cardiomegaly. Electronically Signed   By: Annia Beltrew  Davis M.D.   On: 01/14/2018 19:30     Medical Consultants:    None.  Anti-Infectives:   IV rocephin  Subjective:    Tamara LifeMary Patchin diffuse pain all over which is probably limited her ability to walk around.  Objective:    Vitals:   01/15/18 1349 01/15/18 2127 01/16/18 0521 01/16/18 0524  BP: (!) 142/45 119/88 (!) 142/53   Pulse: 70 71 (!) 122 76  Resp: 20 15 15    Temp: 99.3 F (37.4 C) 99.7 F (37.6 C) (!) 100.5 F (38.1 C)   TempSrc: Oral Oral Oral   SpO2: 92% 97% 96%   Weight:      Height:        Intake/Output Summary (Last 24 hours) at 01/16/2018 0940 Last data filed at 01/16/2018 0600 Gross per 24 hour  Intake 460 ml  Output 350 ml  Net 110 ml   Filed Weights   01/14/18 2225  Weight: 60.5 kg    Exam: General exam: In no acute distress. Respiratory system: Good air movement and clear to auscultation. Cardiovascular system: S1 & S2 heard, RRR. Gastrointestinal system: Abdomen is nondistended, soft and nontender.  Central nervous system: Alert and oriented. No focal neurological deficits. Extremities: No pedal edema. Skin: No rashes, lesions or ulcers Psychiatry: Judgement and insight appear normal. Mood & affect appropriate.    Data Reviewed:    Labs: Basic Metabolic Panel: Recent Labs  Lab 01/14/18 1856 01/14/18 2305 01/15/18 0146  NA  140  --  140  K 3.9  --  3.7  CL 102  --  111  CO2 25  --  22  GLUCOSE 134*  --  118*  BUN 31*  --  26*  CREATININE 1.25*  --  1.00  CALCIUM 9.4  --  7.9*  MG  --  1.9  --   PHOS  --  2.2*  --    GFR Estimated Creatinine Clearance: 31.6 mL/min (by C-G formula based on SCr of 1 mg/dL). Liver Function Tests: Recent Labs  Lab 01/14/18 1856 01/15/18 0146  AST 26 16  ALT 12 10  ALKPHOS 86 64  BILITOT 1.1 0.6  PROT 8.3* 6.0*  ALBUMIN 3.9 2.7*   No results for input(s): LIPASE, AMYLASE in the last 168  hours. No results for input(s): AMMONIA in the last 168 hours. Coagulation profile Recent Labs  Lab 01/14/18 2305  INR 1.01    CBC: Recent Labs  Lab 01/14/18 1856 01/15/18 0146  WBC 14.3* 11.9*  NEUTROABS 11.0*  --   HGB 12.2 9.8*  HCT 40.7 32.5*  MCV 97.6 100.9*  PLT 204 130*   Cardiac Enzymes: No results for input(s): CKTOTAL, CKMB, CKMBINDEX, TROPONINI in the last 168 hours. BNP (last 3 results) No results for input(s): PROBNP in the last 8760 hours. CBG: No results for input(s): GLUCAP in the last 168 hours. D-Dimer: No results for input(s): DDIMER in the last 72 hours. Hgb A1c: No results for input(s): HGBA1C in the last 72 hours. Lipid Profile: No results for input(s): CHOL, HDL, LDLCALC, TRIG, CHOLHDL, LDLDIRECT in the last 72 hours. Thyroid function studies: No results for input(s): TSH, T4TOTAL, T3FREE, THYROIDAB in the last 72 hours.  Invalid input(s): FREET3 Anemia work up: No results for input(s): VITAMINB12, FOLATE, FERRITIN, TIBC, IRON, RETICCTPCT in the last 72 hours. Sepsis Labs: Recent Labs  Lab 01/14/18 1856 01/14/18 1857 01/14/18 2206 01/14/18 2305 01/15/18 0146  WBC 14.3*  --   --   --  11.9*  LATICACIDVEN  --  4.00* 0.33* 1.9 1.2   Microbiology Recent Results (from the past 240 hour(s))  Blood Culture (routine x 2)     Status: None (Preliminary result)   Collection Time: 01/14/18  6:45 PM  Result Value Ref Range Status   Specimen Description   Final    BLOOD RIGHT ANTECUBITAL Performed at [facility], [address] [place], [place] [address]    Special Requests   Final    BOTTLES DRAWN AEROBIC AND ANAEROBIC Blood Culture adequate volume Performed at [facility], [address] [place], [place] [address]    Culture   Final    NO GROWTH < 24 HOURS Performed at [facility], [address] [place], [place] [address]    Report Status PENDING  Incomplete  Blood Culture (routine x 2)      Status: None (Preliminary result)   Collection Time: 01/14/18  6:50 PM  Result Value Ref Range Status   Specimen Description   Final    BLOOD BLOOD LEFT FOREARM Performed at [facility], [address] [place], [place] [address]    Special Requests   Final    BOTTLES DRAWN AEROBIC AND ANAEROBIC Blood Culture adequate volume Performed at [facility], [address] [place], [place] [address]    Culture   Final    NO GROWTH < 24 HOURS Performed at [facility], [address], [place], [place] [address]    Report Status PENDING  Incomplete  Urine culture     Status: Abnormal   Collection Time: 01/14/18 10:45 PM  Result Value Ref Range Status   Specimen Description   Final    URINE, RANDOM Performed at [facility], [address], [place], [place] [address]    Special Requests   Final    NONE Performed at [facility], [address], [place], [place] [address]    Culture (A)  Final    <10,000 COLONIES/mL INSIGNIFICANT GROWTH Performed at [facility], [address], [place], [place] [address]    Report Status 01/16/2018 FINAL  Final     Medications:   . aspirin EC  81 mg Oral Daily  . atenolol  25 mg Oral Daily  . atorvastatin  20 mg Oral Daily  . enoxaparin (LOVENOX) injection  30 mg Subcutaneous QHS  . levothyroxine  75 mcg Oral QAC breakfast   Continuous Infusions: . cefTRIAXone (ROCEPHIN)  IV 1 g (01/15/18 1015)  . sodium chloride       LOS: 1 day   Marinda Elk  [facility]   *Please refer to [email or web address], password [email or web address] to get updated schedule on who will round on this patient, as hospitalists switch teams weekly. If 7PM-7AM, please contact night-coverage at [email or web address], password [email or web address] for any overnight needs.  01/16/2018, 9:40 AM

## 2018-01-17 DIAGNOSIS — I10 Essential (primary) hypertension: Secondary | ICD-10-CM | POA: Diagnosis present

## 2018-01-17 DIAGNOSIS — Z8249 Family history of ischemic heart disease and other diseases of the circulatory system: Secondary | ICD-10-CM | POA: Diagnosis not present

## 2018-01-17 DIAGNOSIS — N390 Urinary tract infection, site not specified: Secondary | ICD-10-CM | POA: Diagnosis present

## 2018-01-17 DIAGNOSIS — H3530 Unspecified macular degeneration: Secondary | ICD-10-CM | POA: Diagnosis present

## 2018-01-17 DIAGNOSIS — M19042 Primary osteoarthritis, left hand: Secondary | ICD-10-CM | POA: Diagnosis present

## 2018-01-17 DIAGNOSIS — R627 Adult failure to thrive: Secondary | ICD-10-CM | POA: Diagnosis present

## 2018-01-17 DIAGNOSIS — R509 Fever, unspecified: Secondary | ICD-10-CM | POA: Diagnosis not present

## 2018-01-17 DIAGNOSIS — Z79890 Hormone replacement therapy (postmenopausal): Secondary | ICD-10-CM | POA: Diagnosis not present

## 2018-01-17 DIAGNOSIS — D631 Anemia in chronic kidney disease: Secondary | ICD-10-CM | POA: Diagnosis present

## 2018-01-17 DIAGNOSIS — R011 Cardiac murmur, unspecified: Secondary | ICD-10-CM | POA: Diagnosis present

## 2018-01-17 DIAGNOSIS — E785 Hyperlipidemia, unspecified: Secondary | ICD-10-CM | POA: Diagnosis present

## 2018-01-17 DIAGNOSIS — L89151 Pressure ulcer of sacral region, stage 1: Secondary | ICD-10-CM | POA: Diagnosis present

## 2018-01-17 DIAGNOSIS — E039 Hypothyroidism, unspecified: Secondary | ICD-10-CM | POA: Diagnosis present

## 2018-01-17 DIAGNOSIS — M19041 Primary osteoarthritis, right hand: Secondary | ICD-10-CM | POA: Diagnosis present

## 2018-01-17 DIAGNOSIS — Z87440 Personal history of urinary (tract) infections: Secondary | ICD-10-CM | POA: Diagnosis not present

## 2018-01-17 DIAGNOSIS — Z79899 Other long term (current) drug therapy: Secondary | ICD-10-CM | POA: Diagnosis not present

## 2018-01-17 DIAGNOSIS — Z823 Family history of stroke: Secondary | ICD-10-CM | POA: Diagnosis not present

## 2018-01-17 DIAGNOSIS — R3129 Other microscopic hematuria: Secondary | ICD-10-CM | POA: Diagnosis present

## 2018-01-17 DIAGNOSIS — A419 Sepsis, unspecified organism: Secondary | ICD-10-CM | POA: Diagnosis present

## 2018-01-17 DIAGNOSIS — Z66 Do not resuscitate: Secondary | ICD-10-CM | POA: Diagnosis present

## 2018-01-17 DIAGNOSIS — R296 Repeated falls: Secondary | ICD-10-CM | POA: Diagnosis present

## 2018-01-17 DIAGNOSIS — E872 Acidosis: Secondary | ICD-10-CM | POA: Diagnosis present

## 2018-01-17 DIAGNOSIS — Z7982 Long term (current) use of aspirin: Secondary | ICD-10-CM | POA: Diagnosis not present

## 2018-01-17 DIAGNOSIS — N179 Acute kidney failure, unspecified: Secondary | ICD-10-CM | POA: Diagnosis present

## 2018-01-17 DIAGNOSIS — L89152 Pressure ulcer of sacral region, stage 2: Secondary | ICD-10-CM | POA: Diagnosis not present

## 2018-01-17 LAB — CBC
HEMATOCRIT: 30.8 % — AB (ref 36.0–46.0)
Hemoglobin: 9.3 g/dL — ABNORMAL LOW (ref 12.0–15.0)
MCH: 29.3 pg (ref 26.0–34.0)
MCHC: 30.2 g/dL (ref 30.0–36.0)
MCV: 97.2 fL (ref 80.0–100.0)
NRBC: 0 % (ref 0.0–0.2)
Platelets: 150 10*3/uL (ref 150–400)
RBC: 3.17 MIL/uL — ABNORMAL LOW (ref 3.87–5.11)
RDW: 14.3 % (ref 11.5–15.5)
WBC: 7.9 10*3/uL (ref 4.0–10.5)

## 2018-01-17 LAB — CREATININE, SERUM
CREATININE: 1.02 mg/dL — AB (ref 0.44–1.00)
GFR calc Af Amer: 54 mL/min — ABNORMAL LOW (ref >60)
GFR, EST NON AFRICAN AMERICAN: 47 mL/min — AB (ref >60)

## 2018-01-17 MED ORDER — SODIUM CHLORIDE 0.9 % IV SOLN
100.0000 mg | Freq: Two times a day (BID) | INTRAVENOUS | Status: DC
  Administered 2018-01-17 – 2018-01-18 (×2): 100 mg via INTRAVENOUS
  Filled 2018-01-17 (×3): qty 100

## 2018-01-17 NOTE — Progress Notes (Signed)
[facility] PROGRESS NOTE    Progress Note  [name]  [id number] DOB: 04/09/1924 DOA: 01/14/2018 PCP: Jarrett Soho, PA-C     Brief Narrative:   [name] is an 84 y.o. female past medical history of hypertension recently admitted for urinary tract infection and acute renal failure secondary to rhabdomyolysis after fall presents to the emergency room for evaluation of fever, was found by family sitting in her urine confused  Assessment/Plan:   Sepsis [facility]): Of unclear source. After de-escalation of antibiotics she started having fevers, will continue Rocephin add IV vancomycin.  We will change IV vancomycin to IV doxycycline.  Continue to monitor fever curve. Blood cultures negative till date and urine cultures less than 10,000 colonies Physical therapy evaluated the patient recommended skilled nursing facility.  Acute kidney injury: Likely prerenal azotemia improved with IV fluid hydration.  Stage I sacral decub ulcer: Partial thickness loss of the dermis with red-pink wound without sloughing, this was present on admission. Consult wound care    DVT prophylaxis: lovenox Family Communication:none Disposition Plan/Barrier to D/C: SNF in 1-2 days Code Status:     Code Status Orders  (From admission, onward)         Start     Ordered   01/14/18 2245  Full code  Continuous     01/14/18 2245        Code Status History    Date Active Date Inactive Code Status Order ID Comments User Context   12/15/2017 2342 12/18/2017 1949 Full Code [id number]  Rometta Emery, MD ED        IV Access:    Peripheral IV   Procedures and diagnostic studies:   No results found.   Medical Consultants:    None.  Anti-Infectives:   IV rocephin  Subjective:    [name] diffuse pain all over which is probably limited her ability to walk around.  Objective:    Vitals:   01/16/18 2107 01/17/18 0252 01/17/18 0525 01/17/18 0900  BP: (!) 136/52 (!)  132/53 (!) 132/49 (!) 146/56  Pulse: 69  61 63  Resp: 20  20   Temp: 99.2 F (37.3 C) 98.1 F (36.7 C) 98.2 F (36.8 C) 98.2 F (36.8 C)  TempSrc: Oral Oral Oral Oral  SpO2: 95%  98% 97%  Weight:      Height:        Intake/Output Summary (Last 24 hours) at 01/17/2018 1051 Last data filed at 01/17/2018 0854 Gross per 24 hour  Intake 100 ml  Output 300 ml  Net -200 ml   Filed Weights   01/14/18 2225  Weight: 60.5 kg    Exam: General exam: In no acute distress. Respiratory system: Good air movement and clear to auscultation. Cardiovascular system: S1 & S2 heard, RRR. Gastrointestinal system: Abdomen is nondistended, soft and nontender.  Central nervous system: Alert and oriented. No focal neurological deficits. Extremities: No pedal edema. Skin: No rashes, lesions or ulcers Psychiatry: Judgement and insight appear normal. Mood & affect appropriate.    Data Reviewed:    Labs: Basic Metabolic Panel: Recent Labs  Lab 01/14/18 1856 01/14/18 2305 01/15/18 0146 01/17/18 0432  NA 140  --  140  --   K 3.9  --  3.7  --   CL 102  --  111  --   CO2 25  --  22  --   GLUCOSE 134*  --  118*  --   BUN 31*  --  26*  --  CREATININE 1.25*  --  1.00 1.02*  CALCIUM 9.4  --  7.9*  --   MG  --  1.9  --   --   PHOS  --  2.2*  --   --    GFR Estimated Creatinine Clearance: 31 mL/min (A) (by C-G formula based on SCr of 1.02 mg/dL (H)). Liver Function Tests: Recent Labs  Lab 01/14/18 1856 01/15/18 0146  AST 26 16  ALT 12 10  ALKPHOS 86 64  BILITOT 1.1 0.6  PROT 8.3* 6.0*  ALBUMIN 3.9 2.7*   No results for input(s): LIPASE, AMYLASE in the last 168 hours. No results for input(s): AMMONIA in the last 168 hours. Coagulation profile Recent Labs  Lab 01/14/18 2305  INR 1.01    CBC: Recent Labs  Lab 01/14/18 1856 01/15/18 0146 01/16/18 1055 01/17/18 0432  WBC 14.3* 11.9* 8.7 7.9  NEUTROABS 11.0*  --  6.1  --   HGB 12.2 9.8* 9.4* 9.3*  HCT 40.7 32.5* 30.5*  30.8*  MCV 97.6 100.9* 99.0 97.2  PLT 204 130* 131* 150   Cardiac Enzymes: No results for input(s): CKTOTAL, CKMB, CKMBINDEX, TROPONINI in the last 168 hours. BNP (last 3 results) No results for input(s): PROBNP in the last 8760 hours. CBG: No results for input(s): GLUCAP in the last 168 hours. D-Dimer: No results for input(s): DDIMER in the last 72 hours. Hgb A1c: No results for input(s): HGBA1C in the last 72 hours. Lipid Profile: No results for input(s): CHOL, HDL, LDLCALC, TRIG, CHOLHDL, LDLDIRECT in the last 72 hours. Thyroid function studies: No results for input(s): TSH, T4TOTAL, T3FREE, THYROIDAB in the last 72 hours.  Invalid input(s): FREET3 Anemia work up: No results for input(s): VITAMINB12, FOLATE, FERRITIN, TIBC, IRON, RETICCTPCT in the last 72 hours. Sepsis Labs: Recent Labs  Lab 01/14/18 1856 01/14/18 1857 01/14/18 2206 01/14/18 2305 01/15/18 0146 01/16/18 1055 01/17/18 0432  WBC 14.3*  --   --   --  11.9* 8.7 7.9  LATICACIDVEN  --  4.00* 0.33* 1.9 1.2  --   --    Microbiology Recent Results (from the past 240 hour(s))  Blood Culture (routine x 2)     Status: None (Preliminary result)   Collection Time: 01/14/18  6:45 PM  Result Value Ref Range Status   Specimen Description   Final    BLOOD RIGHT ANTECUBITAL Performed at [facility], [address] [place], [place] [address]    Special Requests   Final    BOTTLES DRAWN AEROBIC AND ANAEROBIC Blood Culture adequate volume Performed at [facility], [address] [place], [place] [address]    Culture   Final    NO GROWTH 2 DAYS Performed at [facility], [address] [place], [place] [address]    Report Status PENDING  Incomplete  Blood Culture (routine x 2)     Status: None (Preliminary result)   Collection Time: 01/14/18  6:50 PM  Result Value Ref Range Status   Specimen Description   Final    BLOOD BLOOD LEFT FOREARM Performed at [facility], [address] [place], [place] [address]    Special Requests   Final    BOTTLES DRAWN AEROBIC AND ANAEROBIC Blood Culture adequate volume Performed at [facility], [address] [place], [place] [address]    Culture   Final    NO GROWTH 2 DAYS Performed at [facility], [address], [place], [place] [address]    Report Status PENDING  Incomplete  Urine culture     Status: Abnormal   Collection Time: 01/14/18 10:45 PM  Result Value Ref Range Status   Specimen Description   Final    URINE, RANDOM Performed at [facility], [address], [place], [place] [address]    Special Requests   Final    NONE Performed at [facility], [address] [place], [place] [address]    Culture (A)  Final    <10,000 COLONIES/mL INSIGNIFICANT GROWTH Performed at [facility], [address] [place], [place] [address]    Report Status 01/16/2018 FINAL  Final     Medications:   . aspirin EC  81 mg Oral Daily  . atenolol  25 mg Oral Daily  . atorvastatin  20 mg Oral Daily  . levothyroxine  75 mcg Oral QAC breakfast   Continuous Infusions: . cefTRIAXone (ROCEPHIN)  IV 1 g (01/17/18 0920)  . sodium chloride    . vancomycin 500 mg (01/16/18 1337)     LOS: 1 day   Marinda Elk  [facility]   *Please refer to [email or web address], password [email or web address] to get updated schedule on who will round on this patient, as hospitalists switch teams weekly. If 7PM-7AM, please contact night-coverage at [email or web address], password [email or web address] for any overnight needs.  01/17/2018, 10:51 AM

## 2018-01-18 LAB — CREATININE, SERUM
CREATININE: 0.86 mg/dL (ref 0.44–1.00)
GFR calc Af Amer: >60 mL/min (ref >60)
GFR, EST NON AFRICAN AMERICAN: 57 mL/min — AB (ref >60)

## 2018-01-18 MED ORDER — DOXYCYCLINE HYCLATE 100 MG PO TABS
100.0000 mg | ORAL_TABLET | Freq: Two times a day (BID) | ORAL | Status: DC
  Administered 2018-01-18 – 2018-01-20 (×5): 100 mg via ORAL
  Filled 2018-01-18 (×5): qty 1

## 2018-01-18 MED ORDER — CEFDINIR 300 MG PO CAPS
300.0000 mg | ORAL_CAPSULE | Freq: Two times a day (BID) | ORAL | Status: DC
  Administered 2018-01-18 – 2018-01-20 (×5): 300 mg via ORAL
  Filled 2018-01-18 (×6): qty 1

## 2018-01-18 NOTE — Progress Notes (Signed)
Physical Therapy Treatment Patient Details Name: [name] MRN: [id number] DOB: 04-Nov-1924 Today's Date: 01/18/2018    History of Present Illness 84 y.o. female past medical history of hypertension recently admitted for urinary tract infection and acute renal failure secondary to rhabdomyolysis after fall presents to the emergency room for evaluation of fever, was found by family sitting in her urine confused, diagnosed with sepsis likely due to UTI    PT Comments    Checked with RN to see if pt needed premedication due to pain last session.  Pt agreeable to mobilize as tolerated. Pt assisted to EOB and with standing x3 however pt unable to stand fully erect due to weakness.  Continue to recommend SNF upon d/c.   Follow Up Recommendations  SNF     Equipment Recommendations  Wheelchair cushion (measurements PT);Wheelchair (measurements PT)(if home)    Recommendations for Other Services       Precautions / Restrictions Precautions Precautions: Fall Restrictions Weight Bearing Restrictions: No    Mobility  Bed Mobility Overal bed mobility: Needs Assistance Bed Mobility: Supine to Sit;Sit to Supine     Supine to sit: Mod assist Sit to supine: Mod assist   General bed mobility comments: assist for trunk upright, LEs onto bed, cues for technique  Transfers Overall transfer level: Needs assistance Equipment used: Rolling walker (2 wheeled) Transfers: Sit to/from Stand Sit to Stand: Max assist;From elevated surface         General transfer comment: therapist donned pt's shoes (per her request), performed sit to stands x3 and pt just able to clear buttocks from bed however unable to stand fully erect  Ambulation/Gait                 Stairs             Wheelchair Mobility    Modified Rankin (Stroke Patients Only)       Balance Overall balance assessment: Needs assistance Sitting-balance support: Feet supported;Bilateral upper extremity  supported Sitting balance-Leahy Scale: Poor                                      Cognition Arousal/Alertness: Awake/alert Behavior During Therapy: WFL for tasks assessed/performed Overall Cognitive Status: Within Functional Limits for tasks assessed                                        Exercises      General Comments        Pertinent Vitals/Pain Pain Assessment: Faces Faces Pain Scale: Hurts even more Pain Location: L knee, L shoulder Pain Descriptors / Indicators: Aching;Discomfort;Sore Pain Intervention(s): Limited activity within patient's tolerance;Monitored during session;Repositioned(checked with RN for premedication)    Home Living                      Prior Function            PT Goals (current goals can now be found in the care plan section) Progress towards PT goals: Progressing toward goals    Frequency    Min 2X/week      PT Plan Current plan remains appropriate    Co-evaluation              AM-PAC PT "6 Clicks" Mobility   Outcome Measure  Help needed turning from  your back to your side while in a flat bed without using bedrails?: A Lot Help needed moving from lying on your back to sitting on the side of a flat bed without using bedrails?: Total Help needed moving to and from a bed to a chair (including a wheelchair)?: Total Help needed standing up from a chair using your arms (e.g., wheelchair or bedside chair)?: A Lot Help needed to walk in hospital room?: Total Help needed climbing 3-5 steps with a railing? : Total 6 Click Score: 8    End of Session Equipment Utilized During Treatment: Gait belt Activity Tolerance: Patient tolerated treatment well Patient left: with call bell/phone within reach;in bed;with bed alarm set   PT Visit Diagnosis: Difficulty in walking, not elsewhere classified (R26.2);Muscle weakness (generalized) (M62.81)     Time: [id number] PT Time Calculation (min) (ACUTE  ONLY): 19 min  Charges:  $Therapeutic Activity: 8-22 mins                    Zenovia JarredKati Jetty Berland, PT, DPT Acute Rehabilitation Services Office: [phone] Pager: [phone]  Sarajane JewsLEMYRE,KATHrine E 01/18/2018, 12:57 PM

## 2018-01-18 NOTE — Clinical Social Work Note (Signed)
Clinical Social Work Assessment  Patient Details  Name: [name] MRN: [id number] Date of Birth: [birth year]  Date of referral:  01/18/18               Reason for consult:  Discharge Planning, Facility Placement                Permission sought to share information with:  Family Supports Permission granted to share information::  Yes, Verbal Permission Granted  Name::     [place] (pt pastor)   Agency::     Relationship::  pt pastor  Contact Information:  [phone]  Housing/Transportation Living arrangements for the past 2 months:  [place] of Information:  Patient, Other (Comment Required)(pt's pastor) Patient Interpreter Needed:  None Criminal Activity/Legal Involvement Pertinent to Current Situation/Hospitalization:  No - Comment as needed Significant Relationships:  [employer] Lives with:  Self Do you feel safe going back to the place where you live?  No Need for family participation in patient care:  Yes (Comment)  Care giving concerns:  Patient has her pastor Georgina Peer in the room. Patient has no family and only has support from her church members who are also elderly. Per pastor patients lives alone and has hard time taking care of herself   Social Worker assessment / plan:  CSW met patient and pastor at bedside. Physical Therapy recommended SNF but patient has recently been discharge from a facility and is already in her copay days. Per pastor patient is in debt and most likely will not be able to afford copays. Doristine Bosworth requested information on how patient can have a guardian. Doristine Bosworth stated patient does not have a POA or a guardian and wanted to have the state be patients Guardian.  Employment status:  Retired Nurse, adult PT Recommendations:  [place] / Referral to community resources:  [place]  Patient/Family's Response to care:  Patient is requesting to go to rehab    Patient/Family's Understanding of and Emotional Response to Diagnosis, Current Treatment, and Prognosis:  Patient and pastor agreeable to go to rehab but is aware that if insurance denies patient she will have to go home with home health  Emotional Assessment Appearance:  Appears stated age Attitude/Demeanor/Rapport:  Engaged Affect (typically observed):  Accepting, Pleasant Orientation:  Oriented to Self, Oriented to Place, Oriented to  Time, Oriented to Situation Alcohol / Substance use:  Not Applicable Psych involvement (Current and /or in the community):     Discharge Needs  Concerns to be addressed:  Lack of Support Readmission within the last 30 days:  No Current discharge risk:  Dependent with Mobility Barriers to Discharge:  Continued Medical Work up, [place], LCSW 01/18/2018, 11:46 AM

## 2018-01-18 NOTE — Care Management Important Message (Signed)
Important Message  Patient Details  Name: [name] MRN: [id number] Date of Birth: 1925-02-01   Medicare Important Message Given:  Yes    Caren MacadamFuller, Braelyn Bordonaro 01/18/2018, 1:18 PMImportant Message  Patient Details  Name: [name] MRN: [id number] Date of Birth: 1925-02-01   Medicare Important Message Given:  Yes    Caren MacadamFuller, Jerrol Helmers 01/18/2018, 1:18 PM

## 2018-01-18 NOTE — Care Management Note (Signed)
Case Management Note  Patient Details  Name: [name] MRN: [id number] Date of Birth: Feb 26, 1924  Subjective/Objective:                    Action/Plan:  For Healthcare power attorney. Staff RN will follow up with Chaplain    Expected Discharge Date:  (unknown)               Expected Discharge Plan:  Skilled Nursing Facility  In-House Referral:  Clinical Social Work  Discharge planning Services  CM Consult  Post Acute Care Choice:    Choice offered to:     DME Arranged:    DME Agency:     HH Arranged:    HH Agency:     Status of Service:  Completed, signed off  If discussed at [employer] Meetings, dates discussed:    Additional CommentsGeni Bers:  Aide Wojnar, RN 01/18/2018, 2:07 PM

## 2018-01-18 NOTE — Progress Notes (Signed)
VAST RN to pt's bedside to assess IV and start new one if needed. PIV in RPF is difficult to flush and arm is swollen, although pt does not complain of pain. Spoke with [place], unit RN who stated she may be discharged today and antibiotics have been changed since she put in consult. She will put in another IV team consult if pt needs PIV at later time.

## 2018-01-18 NOTE — Progress Notes (Signed)
[facility] PROGRESS NOTE    Progress Note  Tamara LifeMary Mcroberts  ZOX:[id number] DOB: Jan 20, 1925 DOA: 01/14/2018 PCP: Jarrett SohoWharton, Courtney, PA-C     Brief Narrative:   [name] is an 84 y.o. female past medical history of hypertension recently admitted for urinary tract infection and acute renal failure secondary to rhabdomyolysis after fall presents to the emergency room for evaluation of fever, was found by family sitting in her urine confused  Assessment/Plan:   Sepsis [facility](HCC): Of unclear source. Has remained afebrile on IV doxycycline and Rocephin.  She has remained afebrile, transition antibiotics to IV oral doxy and Omnicef Blood cultures negative till date and urine cultures less than 10,000 colonies Physical therapy evaluated the patient recommended skilled nursing facility.  Acute kidney injury: Likely prerenal azotemia improved with IV fluid hydration.  Stage I sacral decub ulcer: Partial thickness loss of the dermis with red-pink wound without sloughing, this was present on admission. Consult wound care    DVT prophylaxis: lovenox Family Communication:none Disposition Plan/Barrier to D/C: SNF in 1 days Code Status:     Code Status Orders  (From admission, onward)         Start     Ordered   01/14/18 2245  Full code  Continuous     01/14/18 2245        Code Status History    Date Active Date Inactive Code Status Order ID Comments User Context   12/15/2017 2342 12/18/2017 1949 Full Code [id number]  Rometta EmeryGarba, Mohammad L, MD ED        IV Access:    Peripheral IV   Procedures and diagnostic studies:   No results found.   Medical Consultants:    None.  Anti-Infectives:   IV rocephin  Subjective:    Tamara LifeMary Braaten diffuse pain all over which is probably limited her ability to walk around.  Objective:    Vitals:   01/17/18 1510 01/17/18 2039 01/18/18 0507 01/18/18 0916  BP: (!) 117/98 139/65 (!) 140/58 138/64  Pulse: (!) 57 62 62 66  Resp:  18 17 20    Temp: 98.1 F (36.7 C) 98.4 F (36.9 C) 99 F (37.2 C)   TempSrc:  Oral Oral   SpO2: 94% 98% 97%   Weight:      Height:        Intake/Output Summary (Last 24 hours) at 01/18/2018 1019 Last data filed at 01/18/2018 0512 Gross per 24 hour  Intake 350 ml  Output 675 ml  Net -325 ml   Filed Weights   01/14/18 2225  Weight: 60.5 kg    Exam: General exam: In no acute distress. Respiratory system: Good air movement and clear to auscultation. Cardiovascular system: S1 & S2 heard, RRR. Gastrointestinal system: Abdomen is nondistended, soft and nontender.  Central nervous system: Alert and oriented. No focal neurological deficits. Extremities: No pedal edema. Skin: No rashes, lesions or ulcers Psychiatry: Judgement and insight appear normal. Mood & affect appropriate.    Data Reviewed:    Labs: Basic Metabolic Panel: Recent Labs  Lab 01/14/18 1856 01/14/18 2305 01/15/18 0146 01/17/18 0432 01/18/18 0659  NA 140  --  140  --   --   K 3.9  --  3.7  --   --   CL 102  --  111  --   --   CO2 25  --  22  --   --   GLUCOSE 134*  --  118*  --   --   BUN 31*  --  26*  --   --   CREATININE 1.25*  --  1.00 1.02* 0.86  CALCIUM 9.4  --  7.9*  --   --   MG  --  1.9  --   --   --   PHOS  --  2.2*  --   --   --    GFR Estimated Creatinine Clearance: 36.8 mL/min (by C-G formula based on SCr of 0.86 mg/dL). Liver Function Tests: Recent Labs  Lab 01/14/18 1856 01/15/18 0146  AST 26 16  ALT 12 10  ALKPHOS 86 64  BILITOT 1.1 0.6  PROT 8.3* 6.0*  ALBUMIN 3.9 2.7*   No results for input(s): LIPASE, AMYLASE in the last 168 hours. No results for input(s): AMMONIA in the last 168 hours. Coagulation profile Recent Labs  Lab 01/14/18 2305  INR 1.01    CBC: Recent Labs  Lab 01/14/18 1856 01/15/18 0146 01/16/18 1055 01/17/18 0432  WBC 14.3* 11.9* 8.7 7.9  NEUTROABS 11.0*  --  6.1  --   HGB 12.2 9.8* 9.4* 9.3*  HCT 40.7 32.5* 30.5* 30.8*  MCV 97.6 100.9*  99.0 97.2  PLT 204 130* 131* 150   Cardiac Enzymes: No results for input(s): CKTOTAL, CKMB, CKMBINDEX, TROPONINI in the last 168 hours. BNP (last 3 results) No results for input(s): PROBNP in the last 8760 hours. CBG: No results for input(s): GLUCAP in the last 168 hours. D-Dimer: No results for input(s): DDIMER in the last 72 hours. Hgb A1c: No results for input(s): HGBA1C in the last 72 hours. Lipid Profile: No results for input(s): CHOL, HDL, LDLCALC, TRIG, CHOLHDL, LDLDIRECT in the last 72 hours. Thyroid function studies: No results for input(s): TSH, T4TOTAL, T3FREE, THYROIDAB in the last 72 hours.  Invalid input(s): FREET3 Anemia work up: No results for input(s): VITAMINB12, FOLATE, FERRITIN, TIBC, IRON, RETICCTPCT in the last 72 hours. Sepsis Labs: Recent Labs  Lab 01/14/18 1856 01/14/18 1857 01/14/18 2206 01/14/18 2305 01/15/18 0146 01/16/18 1055 01/17/18 0432  WBC 14.3*  --   --   --  11.9* 8.7 7.9  LATICACIDVEN  --  4.00* 0.33* 1.9 1.2  --   --    Microbiology Recent Results (from the past 240 hour(s))  Blood Culture (routine x 2)     Status: None (Preliminary result)   Collection Time: 01/14/18  6:45 PM  Result Value Ref Range Status   Specimen Description   Final    BLOOD RIGHT ANTECUBITAL Performed at [facility], [address] [place], [place] [address]    Special Requests   Final    BOTTLES DRAWN AEROBIC AND ANAEROBIC Blood Culture adequate volume Performed at [facility] [address] [place], [place] [address]    Culture   Final    NO GROWTH 4 DAYS Performed at [facility], [address] [place], [place] [address]    Report Status PENDING  Incomplete  Blood Culture (routine x 2)     Status: None (Preliminary result)   Collection Time: 01/14/18  6:50 PM  Result Value Ref Range Status   Specimen Description   Final    BLOOD BLOOD LEFT FOREARM Performed at [facility], [address] [place], [place] [address]    Special Requests   Final    BOTTLES DRAWN AEROBIC AND ANAEROBIC Blood Culture adequate volume Performed at [facility], [address], [place], [place] [address]    Culture   Final  NO GROWTH 4 DAYS Performed at [facility], [address], [place], [place] [address]    Report Status PENDING  Incomplete  Urine culture     Status: Abnormal   Collection Time: 01/14/18 10:45 PM  Result Value Ref Range Status   Specimen Description   Final    URINE, RANDOM Performed at [facility], [address] [place], [place] [address]    Special Requests   Final    NONE Performed at [facility], [address], [place], [place] [address]    Culture (A)  Final    <10,000 COLONIES/mL INSIGNIFICANT GROWTH Performed at [facility], [address], [place], [place] [address]    Report Status 01/16/2018 FINAL  Final     Medications:   . aspirin EC  81 mg Oral Daily  . atenolol  25 mg Oral Daily  . atorvastatin  20 mg Oral Daily  . levothyroxine  75 mcg Oral QAC breakfast   Continuous Infusions: . cefTRIAXone (ROCEPHIN)  IV 1 g (01/18/18 0916)  . doxycycline (VIBRAMYCIN) IV 100 mg (01/18/18 0107)  . sodium chloride       LOS: 1 day   Marinda Elk  [facility]   *Please refer to [email or web address], password [email or web address] to get updated schedule on who will round on this patient, as hospitalists switch teams weekly. If 7PM-7AM, please contact night-coverage at [email or web address], password [email or web address] for any overnight needs.  01/18/2018, 10:19 AM

## 2018-01-18 NOTE — Progress Notes (Signed)
   01/18/18 1516  Clinical Encounter Type  Visited With Health care provider  Visit Type Initial  Referral From Nurse  Consult/Referral To Chaplain;Other (Comment) (AD)  The chaplain responded to spiritual care consult for Pt. AD.  Before visiting the Pt., the chaplain reviewed the notes and talked to the Pt. RN-Ify.  The RN phoned the SW, the SW will be looking toward guardianship for the Pt.  The chaplain understands the Pt. guardian will have the authority to make medical decisions for the Pt. if the Pt. can not speak for herself. The RN declined the chaplain's AD visit.  The chaplain is available for spiritual care F/U as needed.

## 2018-01-18 NOTE — Progress Notes (Signed)
VAST RN to pt's bedside to evaluate need for PIV. Spoke with Pam, unit RN and educated not placing IV if pt not receiving IV fluids or meds to assist with infection prevention and vein preservation. Advised Pam if IV was needed at later time, to place IV consult. Also educated that if RRT is called for any reason they can start IV quickly, as well as  IVT responds to all codes. She verbalized understanding.

## 2018-01-19 LAB — CULTURE, BLOOD (ROUTINE X 2)
Culture: NO GROWTH
Culture: NO GROWTH
SPECIAL REQUESTS: ADEQUATE
Special Requests: ADEQUATE

## 2018-01-19 NOTE — Progress Notes (Signed)
[facility] PROGRESS NOTE    Progress Note  Tamara LifeMary Volner  [id number] DOB: 12/26/1924 DOA: 01/14/2018 PCP: Jarrett SohoWharton, Courtney, PA-C     Brief Narrative:   [name] is an 84 y.o. female past medical history of hypertension recently admitted for urinary tract infection and acute renal failure secondary to rhabdomyolysis after fall presents to the emergency room for evaluation of fever, was found by family sitting in her urine confused  Assessment/Plan:   Sepsis [facility](HCC): Of unclear source. Has remained afebrile on oral Omnicef and doxycycline. Blood cultures negative till date and urine cultures less than 10,000 colonies Physical therapy evaluated the patient recommended skilled nursing facility. Awaiting placement.  Acute kidney injury: Likely prerenal azotemia improved with IV fluid hydration.  Stage I sacral decub ulcer: Partial thickness loss of the dermis with red-pink wound without sloughing, this was present on admission. Consult wound care    DVT prophylaxis: lovenox Family Communication:none Disposition Plan/Barrier to D/C: SNF in 1 days Code Status:     Code Status Orders  (From admission, onward)         Start     Ordered   01/14/18 2245  Full code  Continuous     01/14/18 2245        Code Status History    Date Active Date Inactive Code Status Order ID Comments User Context   12/15/2017 2342 12/18/2017 1949 Full Code [id number]  Rometta EmeryGarba, Mohammad L, MD ED        IV Access:    Peripheral IV   Procedures and diagnostic studies:   No results found.   Medical Consultants:    None.  Anti-Infectives:   IV rocephin  Subjective:    [name] patient feels better.  Objective:    Vitals:   01/18/18 1359 01/18/18 2116 01/19/18 0513 01/19/18 0845  BP: 126/63 (!) 153/57 (!) 134/48 128/62  Pulse: (!) 57 64 65 60  Resp: 18 18 18 18   Temp: 97.9 F (36.6 C) 99 F (37.2 C) 98.9 F (37.2 C) 98 F (36.7 C)  TempSrc: Oral Oral  Oral Oral  SpO2: 99% 99% 96% 98%  Weight:      Height:        Intake/Output Summary (Last 24 hours) at 01/19/2018 1115 Last data filed at 01/19/2018 0517 Gross per 24 hour  Intake -  Output 400 ml  Net -400 ml   Filed Weights   01/14/18 2225  Weight: 60.5 kg    Exam: General exam: In no acute distress. Respiratory system: Good air movement and clear to auscultation. Cardiovascular system: S1 & S2 heard, RRR. Gastrointestinal system: Abdomen is nondistended, soft and nontender.  Central nervous system: Alert and oriented. No focal neurological deficits. Extremities: Deformity multiple phalanges Skin: No rashes, lesions or ulcers Psychiatry: Judgement and insight appear normal. Mood & affect appropriate.    Data Reviewed:    Labs: Basic Metabolic Panel: Recent Labs  Lab 01/14/18 1856 01/14/18 2305 01/15/18 0146 01/17/18 0432 01/18/18 0659  NA 140  --  140  --   --   K 3.9  --  3.7  --   --   CL 102  --  111  --   --   CO2 25  --  22  --   --   GLUCOSE 134*  --  118*  --   --   BUN 31*  --  26*  --   --   CREATININE 1.25*  --  1.00 1.02* 0.86  CALCIUM 9.4  --  7.9*  --   --   MG  --  1.9  --   --   --   PHOS  --  2.2*  --   --   --    GFR Estimated Creatinine Clearance: 36.8 mL/min (by C-G formula based on SCr of 0.86 mg/dL). Liver Function Tests: Recent Labs  Lab 01/14/18 1856 01/15/18 0146  AST 26 16  ALT 12 10  ALKPHOS 86 64  BILITOT 1.1 0.6  PROT 8.3* 6.0*  ALBUMIN 3.9 2.7*   No results for input(s): LIPASE, AMYLASE in the last 168 hours. No results for input(s): AMMONIA in the last 168 hours. Coagulation profile Recent Labs  Lab 01/14/18 2305  INR 1.01    CBC: Recent Labs  Lab 01/14/18 1856 01/15/18 0146 01/16/18 1055 01/17/18 0432  WBC 14.3* 11.9* 8.7 7.9  NEUTROABS 11.0*  --  6.1  --   HGB 12.2 9.8* 9.4* 9.3*  HCT 40.7 32.5* 30.5* 30.8*  MCV 97.6 100.9* 99.0 97.2  PLT 204 130* 131* 150   Cardiac Enzymes: No results for  input(s): CKTOTAL, CKMB, CKMBINDEX, TROPONINI in the last 168 hours. BNP (last 3 results) No results for input(s): PROBNP in the last 8760 hours. CBG: No results for input(s): GLUCAP in the last 168 hours. D-Dimer: No results for input(s): DDIMER in the last 72 hours. Hgb A1c: No results for input(s): HGBA1C in the last 72 hours. Lipid Profile: No results for input(s): CHOL, HDL, LDLCALC, TRIG, CHOLHDL, LDLDIRECT in the last 72 hours. Thyroid function studies: No results for input(s): TSH, T4TOTAL, T3FREE, THYROIDAB in the last 72 hours.  Invalid input(s): FREET3 Anemia work up: No results for input(s): VITAMINB12, FOLATE, FERRITIN, TIBC, IRON, RETICCTPCT in the last 72 hours. Sepsis Labs: Recent Labs  Lab 01/14/18 1856 01/14/18 1857 01/14/18 2206 01/14/18 2305 01/15/18 0146 01/16/18 1055 01/17/18 0432  WBC 14.3*  --   --   --  11.9* 8.7 7.9  LATICACIDVEN  --  4.00* 0.33* 1.9 1.2  --   --    Microbiology Recent Results (from the past 240 hour(s))  Blood Culture (routine x 2)     Status: None   Collection Time: 01/14/18  6:45 PM  Result Value Ref Range Status   Specimen Description   Final    BLOOD RIGHT ANTECUBITAL Performed at [facility], [address], [place], [place] [address]    Special Requests   Final    BOTTLES DRAWN AEROBIC AND ANAEROBIC Blood Culture adequate volume Performed at [facility], [address], [place], [place] [address]    Culture   Final    NO GROWTH 5 DAYS Performed at [facility], [address] [place], [place] [address]    Report Status 01/19/2018 FINAL  Final  Blood Culture (routine x 2)     Status: None   Collection Time: 01/14/18  6:50 PM  Result Value Ref Range Status   Specimen Description   Final    BLOOD BLOOD LEFT FOREARM Performed at [facility], [address], [place], [place] [address]    Special Requests   Final    BOTTLES DRAWN AEROBIC AND ANAEROBIC  Blood Culture adequate volume Performed at [facility], [address] [place], [place] [address]    Culture   Final    NO GROWTH 5 DAYS Performed at [facility], [address], [place], [place] [address]  Report Status 01/19/2018 FINAL  Final  Urine culture     Status: Abnormal   Collection Time: 01/14/18 10:45 PM  Result Value Ref Range Status   Specimen Description   Final    URINE, RANDOM Performed at [facility], [address], [place], [place] [address]    Special Requests   Final    NONE Performed at [facility], [address], [place], [place] [address]    Culture (A)  Final    <10,000 COLONIES/mL INSIGNIFICANT GROWTH Performed at [facility], [address] [place], [place] [address]    Report Status 01/16/2018 FINAL  Final     Medications:   . aspirin EC  81 mg Oral Daily  . atenolol  25 mg Oral Daily  . atorvastatin  20 mg Oral Daily  . cefdinir  300 mg Oral Q12H  . doxycycline  100 mg Oral Q12H  . levothyroxine  75 mcg Oral QAC breakfast   Continuous Infusions: . sodium chloride       LOS: 2 days   Marinda Elk  [facility]   *Please refer to [email or web address], password [email or web address] to get updated schedule on who will round on this patient, as hospitalists switch teams weekly. If 7PM-7AM, please contact night-coverage at [email or web address], password [email or web address] for any overnight needs.  01/19/2018, 11:15 AM

## 2018-01-20 DIAGNOSIS — R509 Fever, unspecified: Secondary | ICD-10-CM

## 2018-01-20 DIAGNOSIS — N179 Acute kidney failure, unspecified: Secondary | ICD-10-CM

## 2018-01-20 MED ORDER — ACETAMINOPHEN 325 MG PO TABS: 650.0000 mg | ORAL_TABLET | Freq: Four times a day (QID) | ORAL | Status: AC | PRN

## 2018-01-20 NOTE — Progress Notes (Signed)
[facility].  Awaiting [facility] for transport.

## 2018-01-20 NOTE — Discharge Summary (Signed)
Physician Discharge Summary  [name] ZOX:[id number] DOB: [birth year]  PCP: Jarrett SohoWharton, Courtney, PA-C  Admit date: 01/14/2018 Discharge date: 01/20/2018  Recommendations for Outpatient Follow-up:  1. MD at SNF in 3 days with repeat labs (CBC, BMP & TSH). 2. Jarrett Sohoourtney Wharton, PA-C/PCP upon discharge from SNF. 3. Prior home dose of Synthroid is not clear and [facility]. 4. Patient was using OTC eyedrops, unclear preparation-needs to follow-up at SNF.  Home Health: N/A, patient being discharged to SNF. Equipment/Devices: TBD at SNF.  Discharge Condition: Improved and stable. CODE STATUS: Full Diet recommendation: Heart healthy diet.  Discharge Diagnoses:  Active Problems:   Sepsis (HCC)   Stage II pressure ulcer of sacral region (HCC)   AKI (acute kidney injury) [facility](HCC)   Brief Summary: 84 year old female, lives alone, PMH of osteoarthritis for which she uses as needed [employer] brand acetaminophen as needed for pain, HTN, hypothyroidism, macular degeneration of left eye, UTI, frequent falls, recently hospitalized 12/15/2017-12/18/2017 for E. coli acute UTI, acute kidney injury, fall and rhabdomyolysis following which she was [facility] and returned home couple of days prior to this admission, now presented to ED after she was found by home health at her home sitting in her urine but she denied any other complaints except chills.  She denied fever, weakness, dizziness, chest pain, dyspnea, nausea, vomiting, constipation, diarrhea, abdominal pain, dysuria, urinary frequency or change in mental status.  It appears that she did not meet sepsis criteria on admission but was admitted for possible sepsis of unclear source, presumably secondary to UTI.  Assessment and plan:  1. Low-grade fever: Unclear etiology.  On admission, she had temperature of 100.5, WBC of 14.3, mild tachypnea.  She was initially admitted for sepsis suspected due to recurrent UTI.  She was  empirically started on IV cefepime, Flagyl and vancomycin.  This was then narrowed to IV ceftriaxone following which she again had a temperature of 100.5 and IV vancomycin was restarted.  Blood cultures x2: Negative, final report.  Urine culture showed less than 10,000 colonies, insignificant growth.  She was then transitioned to oral doxycycline and Omnicef.  She has completed 6 days of total antibiotics thus far.  Patient at this time denies any symptoms or signs suggestive of active ongoing infection or source of infection.  Thereby, antibiotics will be discontinued at discharge.  However if she does have recurrence of high fever, she may need further evaluation including ID consultation. 2. Acute kidney injury: Likely prerenal azotemia and ACEI use.  Resolved after IV fluids.  ACEI discontinued at discharge to avoid recurrent acute kidney injury. 3. Stage I sacral decubitus ulcer: Continue close follow-up and management at SNF. 4. Osteoarthritis: Patient reports that OTC acetaminophen controls her pains in her hands.  She denies pains elsewhere. 5. Essential hypertension: Controlled on atenolol alone.  Lisinopril discontinued. 6. Hypothyroid: Clinically euthyroid.  No recent TSH.  It is unclear as to what is her home dose of Synthroid.  She apparently was getting Synthroid 50 mcg at recent SNF stay but was taking 75 mcg at home.  This needs to be clarified.   7. Macular degeneration of left eye: Home OTC eyedrops not clearly known.  Outpatient follow-up. 8. Deconditioning and frequent falls: As per PT and OT, recommend SNF but insurance had denied.  Thereby did a peer to peer discussion with Dr.Neil Morganstern on 11/27 and he reiterated that patient does not meet criteria for SNF admission at this time but recommended LTC.  As per discussion with clinical social work, patient lacks insurance to go to LTC.  Social work was eventually able to find her SNF for rehab. 9. Adult failure to thrive:  Multifactorial secondary to very advanced age, deconditioning, frequent falls, lack of adequate social support.  Reportedly her church and pastor assist her at home and she does not have any family.  She lives alone.  This is likely to become a recurrent problem and as per clinical social work, hopefully once her Medicaid is approved, she should be able to go to LTC.  She is determine not to be safe to return home alone. 10. Normocytic anemia: Likely due to chronic disease.  No bleeding reported.  Follow CBCs periodically as outpatient. 11. Microscopic hematuria: Could be due to recent acute UTI.  Recommend repeating urine microscopy in a couple of weeks and reassess if persists.   Consultations:  None  Procedures:  None   Discharge Instructions  Discharge Instructions    Call MD for:  extreme fatigue   Complete by:  As directed    Call MD for:  persistant dizziness or light-headedness   Complete by:  As directed    Call MD for:  severe uncontrolled pain   Complete by:  As directed    Call MD for:  temperature >100.4   Complete by:  As directed    Diet - low sodium heart healthy   Complete by:  As directed    Increase activity slowly   Complete by:  As directed        Medication List    STOP taking these medications   lisinopril 10 MG tablet Commonly known as:  PRINIVIL,ZESTRIL   nystatin-triamcinolone cream Commonly known as:  MYCOLOG II   ondansetron 4 MG tablet Commonly known as:  ZOFRAN   OVER THE COUNTER MEDICATION   OVER THE COUNTER MEDICATION   OVER THE COUNTER MEDICATION     TAKE these medications   acetaminophen 325 MG tablet Commonly known as:  TYLENOL Take 2 tablets (650 mg total) by mouth every 6 (six) hours as needed for mild pain or moderate pain (or Fever >/= 101).   aspirin 81 MG tablet Take 81 mg by mouth daily.   atenolol 25 MG tablet Commonly known as:  TENORMIN Take 25 mg by mouth daily.   atorvastatin 20 MG tablet Commonly known as:   LIPITOR Take 20 mg by mouth daily.   CALTRATE 600 PLUS-VIT D PO Take 2 tablets by mouth daily.   levothyroxine 75 MCG tablet Commonly known as:  SYNTHROID, LEVOTHROID Take 75 mcg by mouth daily before breakfast.      Follow-up Information    MD at SNF. Schedule an appointment as soon as possible for a visit in 3 day(s).   Why:  To be seen with repeat labs (CBC & BMP).       Jarrett Soho, PA-C. Schedule an appointment as soon as possible for a visit.   Specialty:  Family Medicine Why:  Upon discharge from SNF. Contact information: [address] [place] [address] [phone]          No Known Allergies    Procedures/Studies: Dg Chest Port 1 View  Result Date: 01/14/2018 CLINICAL DATA:  Weakness. EXAM: PORTABLE CHEST 1 VIEW COMPARISON:  Chest radiograph 04/10/2008 FINDINGS: Monitoring leads overlie the patient. Stable cardiomegaly. No consolidative pulmonary opacities. No pleural effusion or pneumothorax. Thoracic spine degenerative changes. IMPRESSION: No acute cardiopulmonary process.  Cardiomegaly. Electronically Signed  By: Annia Belt M.D.   On: 01/14/2018 19:30      Subjective: Patient states that she feels much better than she did on admission.  Denies complaints.  No pain reported.  Denies dysuria, urinary frequency, urgency, incontinence.  No fever or chills.  Denies headache, earache, sore throat, cough, dyspnea, nausea, vomiting or diarrhea.  As per RN, no acute issues noted.  Discharge Exam:  Vitals:   01/19/18 1500 01/19/18 2031 01/20/18 0457 01/20/18 1326  BP: (!) 140/43 (!) 130/42 (!) 142/49 (!) 135/46  Pulse: 60 62 (!) 58 (!) 52  Resp: 18 16 16 18   Temp: 99.3 F (37.4 C) 99.9 F (37.7 C) 99.6 F (37.6 C) 99.1 F (37.3 C)  TempSrc: Oral Oral Oral Oral  SpO2: 94% 96% 96% 97%  Weight:      Height:        General: Pleasant elderly female, small built and frail, lying comfortably in bed.  Does not look septic or toxic.  Oral  mucosa moist. Cardiovascular: S1 & S2 heard, RRR, S1/S2 +. No rubs, gallops or clicks. No JVD or pedal edema.  Pansystolic murmur best heard at apex, grade 3 x 6. Respiratory: Clear to auscultation without wheezing, rhonchi or crackles. No increased work of breathing. Abdominal:  Non distended, non tender & soft. No organomegaly or masses appreciated. Normal bowel sounds heard. CNS: Alert and oriented. No focal deficits. Extremities: no edema, no cyanosis.  Extensive arthritic changes of small joints of hands.   The results of significant diagnostics from this hospitalization (including imaging, microbiology, ancillary and laboratory) are listed below for reference.     Microbiology: Recent Results (from the past 240 hour(s))  Blood Culture (routine x 2)     Status: None   Collection Time: 01/14/18  6:45 PM  Result Value Ref Range Status   Specimen Description   Final    BLOOD RIGHT ANTECUBITAL Performed at [facility], [address], [place], [place] [address]    Special Requests   Final    BOTTLES DRAWN AEROBIC AND ANAEROBIC Blood Culture adequate volume Performed at [facility], [address] [place], [place] [address]    Culture   Final    NO GROWTH 5 DAYS Performed at [facility], [address], [place], [place] [address]    Report Status 01/19/2018 FINAL  Final  Blood Culture (routine x 2)     Status: None   Collection Time: 01/14/18  6:50 PM  Result Value Ref Range Status   Specimen Description   Final    BLOOD BLOOD LEFT FOREARM Performed at [facility], [address], [place], [place] [address]    Special Requests   Final    BOTTLES DRAWN AEROBIC AND ANAEROBIC Blood Culture adequate volume Performed at [facility], [address], [place], [place] [address]    Culture   Final    NO GROWTH 5 DAYS Performed at [facility], [address], [place], [place] [address]     Report Status 01/19/2018 FINAL  Final  Urine culture     Status: Abnormal   Collection Time: 01/14/18 10:45 PM  Result Value Ref Range Status   Specimen Description   Final    URINE, RANDOM Performed at [facility], [address] [place], [place] [address]    Special Requests   Final    NONE Performed at [facility], [address], [place], [place] [address]    Culture (A)  Final    <10,000 COLONIES/mL INSIGNIFICANT GROWTH Performed at [facility], [address] [place], [place] [address]    Report Status 01/16/2018 FINAL  Final     Labs: CBC: Recent Labs  Lab 01/14/18 1856 01/15/18 0146 01/16/18 1055 01/17/18 0432  WBC 14.3* 11.9* 8.7 7.9  NEUTROABS 11.0*  --  6.1  --   HGB 12.2 9.8* 9.4* 9.3*  HCT 40.7 32.5* 30.5* 30.8*  MCV 97.6 100.9* 99.0 97.2  PLT 204 130* 131* 150   Basic Metabolic Panel: Recent Labs  Lab 01/14/18 1856 01/14/18 2305 01/15/18 0146 01/17/18 0432 01/18/18 0659  NA 140  --  140  --   --   K 3.9  --  3.7  --   --   CL 102  --  111  --   --   CO2 25  --  22  --   --   GLUCOSE 134*  --  118*  --   --   BUN 31*  --  26*  --   --   CREATININE 1.25*  --  1.00 1.02* 0.86  CALCIUM 9.4  --  7.9*  --   --   MG  --  1.9  --   --   --   PHOS  --  2.2*  --   --   --    Liver Function Tests: Recent Labs  Lab 01/14/18 1856 01/15/18 0146  AST 26 16  ALT 12 10  ALKPHOS 86 64  BILITOT 1.1 0.6  PROT 8.3* 6.0*  ALBUMIN 3.9 2.7*   Urinalysis    Component Value Date/Time   COLORURINE YELLOW 12/15/2017 1658   APPEARANCEUR HAZY (A) 12/15/2017 1658   LABSPEC 1.016 12/15/2017 1658   PHURINE 5.0 12/15/2017 1658   GLUCOSEU NEGATIVE 12/15/2017 1658   HGBUR LARGE (A) 12/15/2017 1658   BILIRUBINUR NEGATIVE 12/15/2017 1658   KETONESUR NEGATIVE 12/15/2017 1658   PROTEINUR 30 (A) 12/15/2017 1658   UROBILINOGEN 0.2 04/10/2008 1519   NITRITE NEGATIVE 12/15/2017 1658   LEUKOCYTESUR LARGE (A) 12/15/2017  1658      Time coordinating discharge: 40 minutes  SIGNED:  Marcellus Scott, MD, FACP, [facility]. [facility] Pager [phone]  If 7PM-7AM, please contact night-coverage [email or web address] Password [facility] 01/20/2018, 3:47 PM

## 2018-01-20 NOTE — Progress Notes (Signed)
Spoke with pt's Darden Palmerastor Grace [phone], concerning discharge plans.  Pt lives alone and plan to discharge to SNF.

## 2018-01-20 NOTE — Discharge Instructions (Signed)

## 2018-01-20 NOTE — Progress Notes (Signed)
CSW was made aware that the peer to peer with MD and insurance company did not go well and patient was denied. CSW made patients pastor Nicholaus Bloom(Kelley) aware. Nicholaus BloomKelley stated that she does not feel like patient is safe to go home by herself. CSW found alternatives plan for a safe discharge. CSW was able to get approval for a 30 day LOG (Letter of Guarantee) for [facility]. Facility spoke with patient and her pastor and they were in agreement to go to facility. CSW made MD aware and he has agreed to discharge patient to venue  Marrianne MoodAshley Chesbro, MSW,  [place] [phone]

## 2018-01-20 NOTE — Progress Notes (Signed)
Clinical Social Worker facilitated patient discharge including contacting patient family and facility to confirm patient discharge plans.  Clinical information faxed to facility and family agreeable with plan.  CSW arranged ambulance transport via [facility] [facility] .  RN to call [phone] (pt going in rm# [id number]) for report prior to discharge.  Clinical Social Worker will sign off for now as social work intervention is no longer needed. Please consult us again if new need arises.  Marrianne MoodAshley Nilan, MSW, [employer] [phone]

## 2018-01-20 NOTE — Progress Notes (Signed)
Clinical Social Worker following for support and discharge needs. CS[facility] from [facility] and they stated they are requesting a Peer to Peer from patients MD. CSW contacted MD [facility]([place]) to make him aware of insurance request to speak to him.   Deadline for completing peer to peer is 4 pm today: Number# [phone], press option 5   Marrianne MoodAshley Pine, MSW,  [place] [phone]

## 2018-01-26 DIAGNOSIS — R2689 Other abnormalities of gait and mobility: Secondary | ICD-10-CM | POA: Diagnosis not present

## 2018-01-26 DIAGNOSIS — N179 Acute kidney failure, unspecified: Secondary | ICD-10-CM | POA: Diagnosis not present

## 2018-01-26 DIAGNOSIS — M6281 Muscle weakness (generalized): Secondary | ICD-10-CM | POA: Diagnosis not present

## 2018-01-26 DIAGNOSIS — Z741 Need for assistance with personal care: Secondary | ICD-10-CM | POA: Diagnosis not present

## 2018-01-26 DIAGNOSIS — R296 Repeated falls: Secondary | ICD-10-CM | POA: Diagnosis not present

## 2018-01-27 DIAGNOSIS — M6281 Muscle weakness (generalized): Secondary | ICD-10-CM | POA: Diagnosis not present

## 2018-01-27 DIAGNOSIS — N179 Acute kidney failure, unspecified: Secondary | ICD-10-CM | POA: Diagnosis not present

## 2018-01-27 DIAGNOSIS — R296 Repeated falls: Secondary | ICD-10-CM | POA: Diagnosis not present

## 2018-01-27 DIAGNOSIS — R2689 Other abnormalities of gait and mobility: Secondary | ICD-10-CM | POA: Diagnosis not present

## 2018-01-28 DIAGNOSIS — R5381 Other malaise: Secondary | ICD-10-CM | POA: Diagnosis not present

## 2018-01-28 DIAGNOSIS — E039 Hypothyroidism, unspecified: Secondary | ICD-10-CM | POA: Diagnosis not present

## 2018-01-28 DIAGNOSIS — R296 Repeated falls: Secondary | ICD-10-CM | POA: Diagnosis not present

## 2018-01-28 DIAGNOSIS — M6281 Muscle weakness (generalized): Secondary | ICD-10-CM | POA: Diagnosis not present

## 2018-01-28 DIAGNOSIS — D638 Anemia in other chronic diseases classified elsewhere: Secondary | ICD-10-CM | POA: Diagnosis not present

## 2018-01-28 DIAGNOSIS — Z741 Need for assistance with personal care: Secondary | ICD-10-CM | POA: Diagnosis not present

## 2018-01-28 DIAGNOSIS — N179 Acute kidney failure, unspecified: Secondary | ICD-10-CM | POA: Diagnosis not present

## 2018-01-28 DIAGNOSIS — I10 Essential (primary) hypertension: Secondary | ICD-10-CM | POA: Diagnosis not present

## 2018-01-28 DIAGNOSIS — R2689 Other abnormalities of gait and mobility: Secondary | ICD-10-CM | POA: Diagnosis not present

## 2018-01-29 DIAGNOSIS — D638 Anemia in other chronic diseases classified elsewhere: Secondary | ICD-10-CM | POA: Diagnosis not present

## 2018-01-29 DIAGNOSIS — E039 Hypothyroidism, unspecified: Secondary | ICD-10-CM | POA: Diagnosis not present

## 2018-01-29 DIAGNOSIS — R5381 Other malaise: Secondary | ICD-10-CM | POA: Diagnosis not present

## 2018-01-29 DIAGNOSIS — I10 Essential (primary) hypertension: Secondary | ICD-10-CM | POA: Diagnosis not present

## 2018-01-29 DIAGNOSIS — R296 Repeated falls: Secondary | ICD-10-CM | POA: Diagnosis not present

## 2018-01-29 DIAGNOSIS — N179 Acute kidney failure, unspecified: Secondary | ICD-10-CM | POA: Diagnosis not present

## 2018-01-29 DIAGNOSIS — M6281 Muscle weakness (generalized): Secondary | ICD-10-CM | POA: Diagnosis not present

## 2018-01-29 DIAGNOSIS — R2689 Other abnormalities of gait and mobility: Secondary | ICD-10-CM | POA: Diagnosis not present

## 2018-01-31 DIAGNOSIS — N179 Acute kidney failure, unspecified: Secondary | ICD-10-CM | POA: Diagnosis not present

## 2018-01-31 DIAGNOSIS — M6281 Muscle weakness (generalized): Secondary | ICD-10-CM | POA: Diagnosis not present

## 2018-01-31 DIAGNOSIS — Z741 Need for assistance with personal care: Secondary | ICD-10-CM | POA: Diagnosis not present

## 2018-01-31 DIAGNOSIS — R296 Repeated falls: Secondary | ICD-10-CM | POA: Diagnosis not present

## 2018-02-01 DIAGNOSIS — N179 Acute kidney failure, unspecified: Secondary | ICD-10-CM | POA: Diagnosis not present

## 2018-02-01 DIAGNOSIS — R2689 Other abnormalities of gait and mobility: Secondary | ICD-10-CM | POA: Diagnosis not present

## 2018-02-01 DIAGNOSIS — R296 Repeated falls: Secondary | ICD-10-CM | POA: Diagnosis not present

## 2018-02-01 DIAGNOSIS — M6281 Muscle weakness (generalized): Secondary | ICD-10-CM | POA: Diagnosis not present

## 2018-02-02 DIAGNOSIS — M6281 Muscle weakness (generalized): Secondary | ICD-10-CM | POA: Diagnosis not present

## 2018-02-02 DIAGNOSIS — Z741 Need for assistance with personal care: Secondary | ICD-10-CM | POA: Diagnosis not present

## 2018-02-02 DIAGNOSIS — N179 Acute kidney failure, unspecified: Secondary | ICD-10-CM | POA: Diagnosis not present

## 2018-02-02 DIAGNOSIS — R2689 Other abnormalities of gait and mobility: Secondary | ICD-10-CM | POA: Diagnosis not present

## 2018-02-02 DIAGNOSIS — R296 Repeated falls: Secondary | ICD-10-CM | POA: Diagnosis not present

## 2018-02-03 DIAGNOSIS — Z741 Need for assistance with personal care: Secondary | ICD-10-CM | POA: Diagnosis not present

## 2018-02-03 DIAGNOSIS — M6281 Muscle weakness (generalized): Secondary | ICD-10-CM | POA: Diagnosis not present

## 2018-02-03 DIAGNOSIS — R2689 Other abnormalities of gait and mobility: Secondary | ICD-10-CM | POA: Diagnosis not present

## 2018-02-03 DIAGNOSIS — N179 Acute kidney failure, unspecified: Secondary | ICD-10-CM | POA: Diagnosis not present

## 2018-02-03 DIAGNOSIS — R296 Repeated falls: Secondary | ICD-10-CM | POA: Diagnosis not present

## 2018-02-04 DIAGNOSIS — M6281 Muscle weakness (generalized): Secondary | ICD-10-CM | POA: Diagnosis not present

## 2018-02-04 DIAGNOSIS — N179 Acute kidney failure, unspecified: Secondary | ICD-10-CM | POA: Diagnosis not present

## 2018-02-04 DIAGNOSIS — Z741 Need for assistance with personal care: Secondary | ICD-10-CM | POA: Diagnosis not present

## 2018-02-04 DIAGNOSIS — R2689 Other abnormalities of gait and mobility: Secondary | ICD-10-CM | POA: Diagnosis not present

## 2018-02-04 DIAGNOSIS — R296 Repeated falls: Secondary | ICD-10-CM | POA: Diagnosis not present

## 2018-02-05 DIAGNOSIS — R2689 Other abnormalities of gait and mobility: Secondary | ICD-10-CM | POA: Diagnosis not present

## 2018-02-05 DIAGNOSIS — R296 Repeated falls: Secondary | ICD-10-CM | POA: Diagnosis not present

## 2018-02-05 DIAGNOSIS — I10 Essential (primary) hypertension: Secondary | ICD-10-CM | POA: Diagnosis not present

## 2018-02-05 DIAGNOSIS — N179 Acute kidney failure, unspecified: Secondary | ICD-10-CM | POA: Diagnosis not present

## 2018-02-05 DIAGNOSIS — E039 Hypothyroidism, unspecified: Secondary | ICD-10-CM | POA: Diagnosis not present

## 2018-02-05 DIAGNOSIS — R5381 Other malaise: Secondary | ICD-10-CM | POA: Diagnosis not present

## 2018-02-05 DIAGNOSIS — M6281 Muscle weakness (generalized): Secondary | ICD-10-CM | POA: Diagnosis not present

## 2018-02-05 DIAGNOSIS — D638 Anemia in other chronic diseases classified elsewhere: Secondary | ICD-10-CM | POA: Diagnosis not present

## 2018-02-06 DIAGNOSIS — N179 Acute kidney failure, unspecified: Secondary | ICD-10-CM | POA: Diagnosis not present

## 2018-02-06 DIAGNOSIS — M6281 Muscle weakness (generalized): Secondary | ICD-10-CM | POA: Diagnosis not present

## 2018-02-06 DIAGNOSIS — Z741 Need for assistance with personal care: Secondary | ICD-10-CM | POA: Diagnosis not present

## 2018-02-06 DIAGNOSIS — R296 Repeated falls: Secondary | ICD-10-CM | POA: Diagnosis not present

## 2018-02-07 DIAGNOSIS — R2689 Other abnormalities of gait and mobility: Secondary | ICD-10-CM | POA: Diagnosis not present

## 2018-02-07 DIAGNOSIS — M6281 Muscle weakness (generalized): Secondary | ICD-10-CM | POA: Diagnosis not present

## 2018-02-07 DIAGNOSIS — Z741 Need for assistance with personal care: Secondary | ICD-10-CM | POA: Diagnosis not present

## 2018-02-07 DIAGNOSIS — N179 Acute kidney failure, unspecified: Secondary | ICD-10-CM | POA: Diagnosis not present

## 2018-02-07 DIAGNOSIS — R296 Repeated falls: Secondary | ICD-10-CM | POA: Diagnosis not present

## 2018-02-09 DIAGNOSIS — M6281 Muscle weakness (generalized): Secondary | ICD-10-CM | POA: Diagnosis not present

## 2018-02-09 DIAGNOSIS — Z741 Need for assistance with personal care: Secondary | ICD-10-CM | POA: Diagnosis not present

## 2018-02-09 DIAGNOSIS — N179 Acute kidney failure, unspecified: Secondary | ICD-10-CM | POA: Diagnosis not present

## 2018-02-09 DIAGNOSIS — R296 Repeated falls: Secondary | ICD-10-CM | POA: Diagnosis not present

## 2018-02-09 DIAGNOSIS — R2689 Other abnormalities of gait and mobility: Secondary | ICD-10-CM | POA: Diagnosis not present

## 2018-02-10 DIAGNOSIS — M6281 Muscle weakness (generalized): Secondary | ICD-10-CM | POA: Diagnosis not present

## 2018-02-10 DIAGNOSIS — N179 Acute kidney failure, unspecified: Secondary | ICD-10-CM | POA: Diagnosis not present

## 2018-02-10 DIAGNOSIS — R2689 Other abnormalities of gait and mobility: Secondary | ICD-10-CM | POA: Diagnosis not present

## 2018-02-10 DIAGNOSIS — R296 Repeated falls: Secondary | ICD-10-CM | POA: Diagnosis not present

## 2018-02-11 DIAGNOSIS — M6281 Muscle weakness (generalized): Secondary | ICD-10-CM | POA: Diagnosis not present

## 2018-02-11 DIAGNOSIS — R2689 Other abnormalities of gait and mobility: Secondary | ICD-10-CM | POA: Diagnosis not present

## 2018-02-11 DIAGNOSIS — R296 Repeated falls: Secondary | ICD-10-CM | POA: Diagnosis not present

## 2018-02-11 DIAGNOSIS — N179 Acute kidney failure, unspecified: Secondary | ICD-10-CM | POA: Diagnosis not present

## 2018-02-12 DIAGNOSIS — R296 Repeated falls: Secondary | ICD-10-CM | POA: Diagnosis not present

## 2018-02-12 DIAGNOSIS — N179 Acute kidney failure, unspecified: Secondary | ICD-10-CM | POA: Diagnosis not present

## 2018-02-12 DIAGNOSIS — Z741 Need for assistance with personal care: Secondary | ICD-10-CM | POA: Diagnosis not present

## 2018-02-12 DIAGNOSIS — R2689 Other abnormalities of gait and mobility: Secondary | ICD-10-CM | POA: Diagnosis not present

## 2018-02-12 DIAGNOSIS — D638 Anemia in other chronic diseases classified elsewhere: Secondary | ICD-10-CM | POA: Diagnosis not present

## 2018-02-12 DIAGNOSIS — R5381 Other malaise: Secondary | ICD-10-CM | POA: Diagnosis not present

## 2018-02-12 DIAGNOSIS — M6281 Muscle weakness (generalized): Secondary | ICD-10-CM | POA: Diagnosis not present

## 2018-02-12 DIAGNOSIS — E039 Hypothyroidism, unspecified: Secondary | ICD-10-CM | POA: Diagnosis not present

## 2018-02-13 DIAGNOSIS — N179 Acute kidney failure, unspecified: Secondary | ICD-10-CM | POA: Diagnosis not present

## 2018-02-13 DIAGNOSIS — R296 Repeated falls: Secondary | ICD-10-CM | POA: Diagnosis not present

## 2018-02-13 DIAGNOSIS — M6281 Muscle weakness (generalized): Secondary | ICD-10-CM | POA: Diagnosis not present

## 2018-02-13 DIAGNOSIS — Z741 Need for assistance with personal care: Secondary | ICD-10-CM | POA: Diagnosis not present

## 2018-02-15 DIAGNOSIS — N179 Acute kidney failure, unspecified: Secondary | ICD-10-CM | POA: Diagnosis not present

## 2018-02-15 DIAGNOSIS — R296 Repeated falls: Secondary | ICD-10-CM | POA: Diagnosis not present

## 2018-02-15 DIAGNOSIS — M6281 Muscle weakness (generalized): Secondary | ICD-10-CM | POA: Diagnosis not present

## 2018-02-15 DIAGNOSIS — R2689 Other abnormalities of gait and mobility: Secondary | ICD-10-CM | POA: Diagnosis not present

## 2018-02-16 DIAGNOSIS — R2689 Other abnormalities of gait and mobility: Secondary | ICD-10-CM | POA: Diagnosis not present

## 2018-02-16 DIAGNOSIS — M6281 Muscle weakness (generalized): Secondary | ICD-10-CM | POA: Diagnosis not present

## 2018-02-16 DIAGNOSIS — R296 Repeated falls: Secondary | ICD-10-CM | POA: Diagnosis not present

## 2018-02-16 DIAGNOSIS — N179 Acute kidney failure, unspecified: Secondary | ICD-10-CM | POA: Diagnosis not present

## 2018-02-16 DIAGNOSIS — Z741 Need for assistance with personal care: Secondary | ICD-10-CM | POA: Diagnosis not present

## 2018-02-18 DIAGNOSIS — N179 Acute kidney failure, unspecified: Secondary | ICD-10-CM | POA: Diagnosis not present

## 2018-02-18 DIAGNOSIS — R296 Repeated falls: Secondary | ICD-10-CM | POA: Diagnosis not present

## 2018-02-18 DIAGNOSIS — M6281 Muscle weakness (generalized): Secondary | ICD-10-CM | POA: Diagnosis not present

## 2018-02-18 DIAGNOSIS — Z741 Need for assistance with personal care: Secondary | ICD-10-CM | POA: Diagnosis not present

## 2018-02-19 DIAGNOSIS — M6281 Muscle weakness (generalized): Secondary | ICD-10-CM | POA: Diagnosis not present

## 2018-02-19 DIAGNOSIS — R5381 Other malaise: Secondary | ICD-10-CM | POA: Diagnosis not present

## 2018-02-19 DIAGNOSIS — E039 Hypothyroidism, unspecified: Secondary | ICD-10-CM | POA: Diagnosis not present

## 2018-02-19 DIAGNOSIS — R296 Repeated falls: Secondary | ICD-10-CM | POA: Diagnosis not present

## 2018-02-19 DIAGNOSIS — N179 Acute kidney failure, unspecified: Secondary | ICD-10-CM | POA: Diagnosis not present

## 2018-02-19 DIAGNOSIS — D638 Anemia in other chronic diseases classified elsewhere: Secondary | ICD-10-CM | POA: Diagnosis not present

## 2018-02-19 DIAGNOSIS — I10 Essential (primary) hypertension: Secondary | ICD-10-CM | POA: Diagnosis not present

## 2018-02-19 DIAGNOSIS — R2689 Other abnormalities of gait and mobility: Secondary | ICD-10-CM | POA: Diagnosis not present

## 2018-02-21 DIAGNOSIS — M6281 Muscle weakness (generalized): Secondary | ICD-10-CM | POA: Diagnosis not present

## 2018-02-21 DIAGNOSIS — N179 Acute kidney failure, unspecified: Secondary | ICD-10-CM | POA: Diagnosis not present

## 2018-02-21 DIAGNOSIS — Z741 Need for assistance with personal care: Secondary | ICD-10-CM | POA: Diagnosis not present

## 2018-02-21 DIAGNOSIS — R296 Repeated falls: Secondary | ICD-10-CM | POA: Diagnosis not present

## 2018-02-22 DIAGNOSIS — M6281 Muscle weakness (generalized): Secondary | ICD-10-CM | POA: Diagnosis not present

## 2018-02-22 DIAGNOSIS — R296 Repeated falls: Secondary | ICD-10-CM | POA: Diagnosis not present

## 2018-02-22 DIAGNOSIS — R2689 Other abnormalities of gait and mobility: Secondary | ICD-10-CM | POA: Diagnosis not present

## 2018-02-22 DIAGNOSIS — N179 Acute kidney failure, unspecified: Secondary | ICD-10-CM | POA: Diagnosis not present

## 2018-02-23 DIAGNOSIS — N179 Acute kidney failure, unspecified: Secondary | ICD-10-CM | POA: Diagnosis not present

## 2018-02-23 DIAGNOSIS — R296 Repeated falls: Secondary | ICD-10-CM | POA: Diagnosis not present

## 2018-02-23 DIAGNOSIS — R2689 Other abnormalities of gait and mobility: Secondary | ICD-10-CM | POA: Diagnosis not present

## 2018-02-23 DIAGNOSIS — M6281 Muscle weakness (generalized): Secondary | ICD-10-CM | POA: Diagnosis not present

## 2018-02-23 DIAGNOSIS — Z741 Need for assistance with personal care: Secondary | ICD-10-CM | POA: Diagnosis not present

## 2018-02-24 DIAGNOSIS — N179 Acute kidney failure, unspecified: Secondary | ICD-10-CM | POA: Diagnosis not present

## 2018-02-24 DIAGNOSIS — M6281 Muscle weakness (generalized): Secondary | ICD-10-CM | POA: Diagnosis not present

## 2018-02-24 DIAGNOSIS — R2689 Other abnormalities of gait and mobility: Secondary | ICD-10-CM | POA: Diagnosis not present

## 2018-02-24 DIAGNOSIS — Z741 Need for assistance with personal care: Secondary | ICD-10-CM | POA: Diagnosis not present

## 2018-02-24 DIAGNOSIS — R296 Repeated falls: Secondary | ICD-10-CM | POA: Diagnosis not present

## 2018-02-25 DIAGNOSIS — R2689 Other abnormalities of gait and mobility: Secondary | ICD-10-CM | POA: Diagnosis not present

## 2018-02-25 DIAGNOSIS — E039 Hypothyroidism, unspecified: Secondary | ICD-10-CM | POA: Diagnosis not present

## 2018-02-25 DIAGNOSIS — R296 Repeated falls: Secondary | ICD-10-CM | POA: Diagnosis not present

## 2018-02-25 DIAGNOSIS — M6281 Muscle weakness (generalized): Secondary | ICD-10-CM | POA: Diagnosis not present

## 2018-02-25 DIAGNOSIS — D638 Anemia in other chronic diseases classified elsewhere: Secondary | ICD-10-CM | POA: Diagnosis not present

## 2018-02-25 DIAGNOSIS — I10 Essential (primary) hypertension: Secondary | ICD-10-CM | POA: Diagnosis not present

## 2018-02-25 DIAGNOSIS — N179 Acute kidney failure, unspecified: Secondary | ICD-10-CM | POA: Diagnosis not present

## 2018-02-25 DIAGNOSIS — R5381 Other malaise: Secondary | ICD-10-CM | POA: Diagnosis not present

## 2018-02-26 DIAGNOSIS — R2689 Other abnormalities of gait and mobility: Secondary | ICD-10-CM | POA: Diagnosis not present

## 2018-02-26 DIAGNOSIS — M6281 Muscle weakness (generalized): Secondary | ICD-10-CM | POA: Diagnosis not present

## 2018-02-26 DIAGNOSIS — N179 Acute kidney failure, unspecified: Secondary | ICD-10-CM | POA: Diagnosis not present

## 2018-02-26 DIAGNOSIS — Z741 Need for assistance with personal care: Secondary | ICD-10-CM | POA: Diagnosis not present

## 2018-02-26 DIAGNOSIS — R296 Repeated falls: Secondary | ICD-10-CM | POA: Diagnosis not present

## 2018-02-27 DIAGNOSIS — N179 Acute kidney failure, unspecified: Secondary | ICD-10-CM | POA: Diagnosis not present

## 2018-02-27 DIAGNOSIS — Z741 Need for assistance with personal care: Secondary | ICD-10-CM | POA: Diagnosis not present

## 2018-02-27 DIAGNOSIS — M6281 Muscle weakness (generalized): Secondary | ICD-10-CM | POA: Diagnosis not present

## 2018-02-27 DIAGNOSIS — R296 Repeated falls: Secondary | ICD-10-CM | POA: Diagnosis not present

## 2018-02-28 DIAGNOSIS — R2689 Other abnormalities of gait and mobility: Secondary | ICD-10-CM | POA: Diagnosis not present

## 2018-02-28 DIAGNOSIS — R296 Repeated falls: Secondary | ICD-10-CM | POA: Diagnosis not present

## 2018-02-28 DIAGNOSIS — N179 Acute kidney failure, unspecified: Secondary | ICD-10-CM | POA: Diagnosis not present

## 2018-02-28 DIAGNOSIS — M6281 Muscle weakness (generalized): Secondary | ICD-10-CM | POA: Diagnosis not present

## 2018-03-02 DIAGNOSIS — R296 Repeated falls: Secondary | ICD-10-CM | POA: Diagnosis not present

## 2018-03-02 DIAGNOSIS — R2689 Other abnormalities of gait and mobility: Secondary | ICD-10-CM | POA: Diagnosis not present

## 2018-03-02 DIAGNOSIS — Z741 Need for assistance with personal care: Secondary | ICD-10-CM | POA: Diagnosis not present

## 2018-03-02 DIAGNOSIS — N179 Acute kidney failure, unspecified: Secondary | ICD-10-CM | POA: Diagnosis not present

## 2018-03-02 DIAGNOSIS — M6281 Muscle weakness (generalized): Secondary | ICD-10-CM | POA: Diagnosis not present

## 2018-03-03 DIAGNOSIS — Z741 Need for assistance with personal care: Secondary | ICD-10-CM | POA: Diagnosis not present

## 2018-03-03 DIAGNOSIS — R2689 Other abnormalities of gait and mobility: Secondary | ICD-10-CM | POA: Diagnosis not present

## 2018-03-03 DIAGNOSIS — R296 Repeated falls: Secondary | ICD-10-CM | POA: Diagnosis not present

## 2018-03-03 DIAGNOSIS — M6281 Muscle weakness (generalized): Secondary | ICD-10-CM | POA: Diagnosis not present

## 2018-03-03 DIAGNOSIS — N179 Acute kidney failure, unspecified: Secondary | ICD-10-CM | POA: Diagnosis not present

## 2018-03-04 DIAGNOSIS — Z741 Need for assistance with personal care: Secondary | ICD-10-CM | POA: Diagnosis not present

## 2018-03-04 DIAGNOSIS — M6281 Muscle weakness (generalized): Secondary | ICD-10-CM | POA: Diagnosis not present

## 2018-03-04 DIAGNOSIS — R2689 Other abnormalities of gait and mobility: Secondary | ICD-10-CM | POA: Diagnosis not present

## 2018-03-04 DIAGNOSIS — R296 Repeated falls: Secondary | ICD-10-CM | POA: Diagnosis not present

## 2018-03-04 DIAGNOSIS — N179 Acute kidney failure, unspecified: Secondary | ICD-10-CM | POA: Diagnosis not present

## 2018-03-05 DIAGNOSIS — M6281 Muscle weakness (generalized): Secondary | ICD-10-CM | POA: Diagnosis not present

## 2018-03-05 DIAGNOSIS — R296 Repeated falls: Secondary | ICD-10-CM | POA: Diagnosis not present

## 2018-03-05 DIAGNOSIS — N179 Acute kidney failure, unspecified: Secondary | ICD-10-CM | POA: Diagnosis not present

## 2018-03-05 DIAGNOSIS — Z741 Need for assistance with personal care: Secondary | ICD-10-CM | POA: Diagnosis not present

## 2018-03-07 DIAGNOSIS — N179 Acute kidney failure, unspecified: Secondary | ICD-10-CM | POA: Diagnosis not present

## 2018-03-07 DIAGNOSIS — M6281 Muscle weakness (generalized): Secondary | ICD-10-CM | POA: Diagnosis not present

## 2018-03-07 DIAGNOSIS — R296 Repeated falls: Secondary | ICD-10-CM | POA: Diagnosis not present

## 2018-03-07 DIAGNOSIS — Z741 Need for assistance with personal care: Secondary | ICD-10-CM | POA: Diagnosis not present

## 2018-03-08 DIAGNOSIS — N179 Acute kidney failure, unspecified: Secondary | ICD-10-CM | POA: Diagnosis not present

## 2018-03-08 DIAGNOSIS — M6281 Muscle weakness (generalized): Secondary | ICD-10-CM | POA: Diagnosis not present

## 2018-03-08 DIAGNOSIS — R2689 Other abnormalities of gait and mobility: Secondary | ICD-10-CM | POA: Diagnosis not present

## 2018-03-08 DIAGNOSIS — R296 Repeated falls: Secondary | ICD-10-CM | POA: Diagnosis not present

## 2018-03-09 DIAGNOSIS — R2689 Other abnormalities of gait and mobility: Secondary | ICD-10-CM | POA: Diagnosis not present

## 2018-03-09 DIAGNOSIS — N179 Acute kidney failure, unspecified: Secondary | ICD-10-CM | POA: Diagnosis not present

## 2018-03-09 DIAGNOSIS — Z741 Need for assistance with personal care: Secondary | ICD-10-CM | POA: Diagnosis not present

## 2018-03-09 DIAGNOSIS — M6281 Muscle weakness (generalized): Secondary | ICD-10-CM | POA: Diagnosis not present

## 2018-03-09 DIAGNOSIS — R296 Repeated falls: Secondary | ICD-10-CM | POA: Diagnosis not present

## 2018-03-10 DIAGNOSIS — N179 Acute kidney failure, unspecified: Secondary | ICD-10-CM | POA: Diagnosis not present

## 2018-03-10 DIAGNOSIS — R296 Repeated falls: Secondary | ICD-10-CM | POA: Diagnosis not present

## 2018-03-10 DIAGNOSIS — Z741 Need for assistance with personal care: Secondary | ICD-10-CM | POA: Diagnosis not present

## 2018-03-10 DIAGNOSIS — M6281 Muscle weakness (generalized): Secondary | ICD-10-CM | POA: Diagnosis not present

## 2018-03-10 DIAGNOSIS — R2689 Other abnormalities of gait and mobility: Secondary | ICD-10-CM | POA: Diagnosis not present

## 2018-03-11 DIAGNOSIS — M6281 Muscle weakness (generalized): Secondary | ICD-10-CM | POA: Diagnosis not present

## 2018-03-11 DIAGNOSIS — R2689 Other abnormalities of gait and mobility: Secondary | ICD-10-CM | POA: Diagnosis not present

## 2018-03-11 DIAGNOSIS — Z741 Need for assistance with personal care: Secondary | ICD-10-CM | POA: Diagnosis not present

## 2018-03-11 DIAGNOSIS — N179 Acute kidney failure, unspecified: Secondary | ICD-10-CM | POA: Diagnosis not present

## 2018-03-11 DIAGNOSIS — R296 Repeated falls: Secondary | ICD-10-CM | POA: Diagnosis not present

## 2018-03-12 DIAGNOSIS — N179 Acute kidney failure, unspecified: Secondary | ICD-10-CM | POA: Diagnosis not present

## 2018-03-12 DIAGNOSIS — R296 Repeated falls: Secondary | ICD-10-CM | POA: Diagnosis not present

## 2018-03-12 DIAGNOSIS — R2689 Other abnormalities of gait and mobility: Secondary | ICD-10-CM | POA: Diagnosis not present

## 2018-03-12 DIAGNOSIS — M6281 Muscle weakness (generalized): Secondary | ICD-10-CM | POA: Diagnosis not present

## 2018-03-14 DIAGNOSIS — M6281 Muscle weakness (generalized): Secondary | ICD-10-CM | POA: Diagnosis not present

## 2018-03-14 DIAGNOSIS — Z741 Need for assistance with personal care: Secondary | ICD-10-CM | POA: Diagnosis not present

## 2018-03-14 DIAGNOSIS — N179 Acute kidney failure, unspecified: Secondary | ICD-10-CM | POA: Diagnosis not present

## 2018-03-14 DIAGNOSIS — R296 Repeated falls: Secondary | ICD-10-CM | POA: Diagnosis not present

## 2018-03-15 DIAGNOSIS — M6281 Muscle weakness (generalized): Secondary | ICD-10-CM | POA: Diagnosis not present

## 2018-03-15 DIAGNOSIS — N179 Acute kidney failure, unspecified: Secondary | ICD-10-CM | POA: Diagnosis not present

## 2018-03-15 DIAGNOSIS — R296 Repeated falls: Secondary | ICD-10-CM | POA: Diagnosis not present

## 2018-03-15 DIAGNOSIS — R2689 Other abnormalities of gait and mobility: Secondary | ICD-10-CM | POA: Diagnosis not present

## 2018-03-16 DIAGNOSIS — Z741 Need for assistance with personal care: Secondary | ICD-10-CM | POA: Diagnosis not present

## 2018-03-16 DIAGNOSIS — M6281 Muscle weakness (generalized): Secondary | ICD-10-CM | POA: Diagnosis not present

## 2018-03-16 DIAGNOSIS — R296 Repeated falls: Secondary | ICD-10-CM | POA: Diagnosis not present

## 2018-03-16 DIAGNOSIS — R2689 Other abnormalities of gait and mobility: Secondary | ICD-10-CM | POA: Diagnosis not present

## 2018-03-16 DIAGNOSIS — N179 Acute kidney failure, unspecified: Secondary | ICD-10-CM | POA: Diagnosis not present

## 2018-03-17 DIAGNOSIS — M6281 Muscle weakness (generalized): Secondary | ICD-10-CM | POA: Diagnosis not present

## 2018-03-17 DIAGNOSIS — R296 Repeated falls: Secondary | ICD-10-CM | POA: Diagnosis not present

## 2018-03-17 DIAGNOSIS — N179 Acute kidney failure, unspecified: Secondary | ICD-10-CM | POA: Diagnosis not present

## 2018-03-17 DIAGNOSIS — R2689 Other abnormalities of gait and mobility: Secondary | ICD-10-CM | POA: Diagnosis not present

## 2018-03-17 DIAGNOSIS — Z741 Need for assistance with personal care: Secondary | ICD-10-CM | POA: Diagnosis not present

## 2018-03-18 DIAGNOSIS — Z741 Need for assistance with personal care: Secondary | ICD-10-CM | POA: Diagnosis not present

## 2018-03-18 DIAGNOSIS — R296 Repeated falls: Secondary | ICD-10-CM | POA: Diagnosis not present

## 2018-03-18 DIAGNOSIS — M6281 Muscle weakness (generalized): Secondary | ICD-10-CM | POA: Diagnosis not present

## 2018-03-18 DIAGNOSIS — N179 Acute kidney failure, unspecified: Secondary | ICD-10-CM | POA: Diagnosis not present

## 2018-03-18 DIAGNOSIS — R2689 Other abnormalities of gait and mobility: Secondary | ICD-10-CM | POA: Diagnosis not present

## 2018-03-19 DIAGNOSIS — M6281 Muscle weakness (generalized): Secondary | ICD-10-CM | POA: Diagnosis not present

## 2018-03-19 DIAGNOSIS — R5381 Other malaise: Secondary | ICD-10-CM | POA: Diagnosis not present

## 2018-03-19 DIAGNOSIS — N179 Acute kidney failure, unspecified: Secondary | ICD-10-CM | POA: Diagnosis not present

## 2018-03-19 DIAGNOSIS — Z79899 Other long term (current) drug therapy: Secondary | ICD-10-CM | POA: Diagnosis not present

## 2018-03-19 DIAGNOSIS — I10 Essential (primary) hypertension: Secondary | ICD-10-CM | POA: Diagnosis not present

## 2018-03-19 DIAGNOSIS — E039 Hypothyroidism, unspecified: Secondary | ICD-10-CM | POA: Diagnosis not present

## 2018-03-19 DIAGNOSIS — R296 Repeated falls: Secondary | ICD-10-CM | POA: Diagnosis not present

## 2018-03-19 DIAGNOSIS — M150 Primary generalized (osteo)arthritis: Secondary | ICD-10-CM | POA: Diagnosis not present

## 2018-03-19 DIAGNOSIS — R2689 Other abnormalities of gait and mobility: Secondary | ICD-10-CM | POA: Diagnosis not present

## 2018-03-20 DIAGNOSIS — M6281 Muscle weakness (generalized): Secondary | ICD-10-CM | POA: Diagnosis not present

## 2018-03-20 DIAGNOSIS — R296 Repeated falls: Secondary | ICD-10-CM | POA: Diagnosis not present

## 2018-03-20 DIAGNOSIS — N179 Acute kidney failure, unspecified: Secondary | ICD-10-CM | POA: Diagnosis not present

## 2018-03-20 DIAGNOSIS — Z741 Need for assistance with personal care: Secondary | ICD-10-CM | POA: Diagnosis not present

## 2018-03-22 DIAGNOSIS — Z741 Need for assistance with personal care: Secondary | ICD-10-CM | POA: Diagnosis not present

## 2018-03-22 DIAGNOSIS — M6281 Muscle weakness (generalized): Secondary | ICD-10-CM | POA: Diagnosis not present

## 2018-03-22 DIAGNOSIS — R296 Repeated falls: Secondary | ICD-10-CM | POA: Diagnosis not present

## 2018-03-22 DIAGNOSIS — R2689 Other abnormalities of gait and mobility: Secondary | ICD-10-CM | POA: Diagnosis not present

## 2018-03-22 DIAGNOSIS — N179 Acute kidney failure, unspecified: Secondary | ICD-10-CM | POA: Diagnosis not present

## 2018-03-23 DIAGNOSIS — R2689 Other abnormalities of gait and mobility: Secondary | ICD-10-CM | POA: Diagnosis not present

## 2018-03-23 DIAGNOSIS — R296 Repeated falls: Secondary | ICD-10-CM | POA: Diagnosis not present

## 2018-03-23 DIAGNOSIS — N179 Acute kidney failure, unspecified: Secondary | ICD-10-CM | POA: Diagnosis not present

## 2018-03-23 DIAGNOSIS — Z741 Need for assistance with personal care: Secondary | ICD-10-CM | POA: Diagnosis not present

## 2018-03-23 DIAGNOSIS — M6281 Muscle weakness (generalized): Secondary | ICD-10-CM | POA: Diagnosis not present

## 2018-03-24 DIAGNOSIS — R5381 Other malaise: Secondary | ICD-10-CM | POA: Diagnosis not present

## 2018-03-24 DIAGNOSIS — Z741 Need for assistance with personal care: Secondary | ICD-10-CM | POA: Diagnosis not present

## 2018-03-24 DIAGNOSIS — N179 Acute kidney failure, unspecified: Secondary | ICD-10-CM | POA: Diagnosis not present

## 2018-03-24 DIAGNOSIS — R296 Repeated falls: Secondary | ICD-10-CM | POA: Diagnosis not present

## 2018-03-24 DIAGNOSIS — M25561 Pain in right knee: Secondary | ICD-10-CM | POA: Diagnosis not present

## 2018-03-24 DIAGNOSIS — E039 Hypothyroidism, unspecified: Secondary | ICD-10-CM | POA: Diagnosis not present

## 2018-03-24 DIAGNOSIS — M6281 Muscle weakness (generalized): Secondary | ICD-10-CM | POA: Diagnosis not present

## 2018-03-24 DIAGNOSIS — M150 Primary generalized (osteo)arthritis: Secondary | ICD-10-CM | POA: Diagnosis not present

## 2018-03-24 DIAGNOSIS — R2689 Other abnormalities of gait and mobility: Secondary | ICD-10-CM | POA: Diagnosis not present

## 2018-03-25 DIAGNOSIS — R5381 Other malaise: Secondary | ICD-10-CM | POA: Diagnosis not present

## 2018-03-25 DIAGNOSIS — M25562 Pain in left knee: Secondary | ICD-10-CM | POA: Diagnosis not present

## 2018-03-25 DIAGNOSIS — R296 Repeated falls: Secondary | ICD-10-CM | POA: Diagnosis not present

## 2018-03-25 DIAGNOSIS — M6281 Muscle weakness (generalized): Secondary | ICD-10-CM | POA: Diagnosis not present

## 2018-03-25 DIAGNOSIS — Z741 Need for assistance with personal care: Secondary | ICD-10-CM | POA: Diagnosis not present

## 2018-03-25 DIAGNOSIS — M25561 Pain in right knee: Secondary | ICD-10-CM | POA: Diagnosis not present

## 2018-03-25 DIAGNOSIS — N179 Acute kidney failure, unspecified: Secondary | ICD-10-CM | POA: Diagnosis not present

## 2018-03-25 DIAGNOSIS — M150 Primary generalized (osteo)arthritis: Secondary | ICD-10-CM | POA: Diagnosis not present

## 2018-03-26 DIAGNOSIS — N179 Acute kidney failure, unspecified: Secondary | ICD-10-CM | POA: Diagnosis not present

## 2018-03-26 DIAGNOSIS — M6281 Muscle weakness (generalized): Secondary | ICD-10-CM | POA: Diagnosis not present

## 2018-03-26 DIAGNOSIS — R296 Repeated falls: Secondary | ICD-10-CM | POA: Diagnosis not present

## 2018-03-26 DIAGNOSIS — R2689 Other abnormalities of gait and mobility: Secondary | ICD-10-CM | POA: Diagnosis not present

## 2018-03-29 DIAGNOSIS — M6281 Muscle weakness (generalized): Secondary | ICD-10-CM | POA: Diagnosis not present

## 2018-03-29 DIAGNOSIS — N179 Acute kidney failure, unspecified: Secondary | ICD-10-CM | POA: Diagnosis not present

## 2018-03-29 DIAGNOSIS — R2689 Other abnormalities of gait and mobility: Secondary | ICD-10-CM | POA: Diagnosis not present

## 2018-03-29 DIAGNOSIS — R296 Repeated falls: Secondary | ICD-10-CM | POA: Diagnosis not present

## 2018-03-30 DIAGNOSIS — M6281 Muscle weakness (generalized): Secondary | ICD-10-CM | POA: Diagnosis not present

## 2018-03-30 DIAGNOSIS — N179 Acute kidney failure, unspecified: Secondary | ICD-10-CM | POA: Diagnosis not present

## 2018-03-30 DIAGNOSIS — R2689 Other abnormalities of gait and mobility: Secondary | ICD-10-CM | POA: Diagnosis not present

## 2018-03-30 DIAGNOSIS — R296 Repeated falls: Secondary | ICD-10-CM | POA: Diagnosis not present

## 2018-03-31 DIAGNOSIS — R296 Repeated falls: Secondary | ICD-10-CM | POA: Diagnosis not present

## 2018-03-31 DIAGNOSIS — R2689 Other abnormalities of gait and mobility: Secondary | ICD-10-CM | POA: Diagnosis not present

## 2018-03-31 DIAGNOSIS — M6281 Muscle weakness (generalized): Secondary | ICD-10-CM | POA: Diagnosis not present

## 2018-03-31 DIAGNOSIS — N179 Acute kidney failure, unspecified: Secondary | ICD-10-CM | POA: Diagnosis not present

## 2018-04-01 DIAGNOSIS — R2689 Other abnormalities of gait and mobility: Secondary | ICD-10-CM | POA: Diagnosis not present

## 2018-04-01 DIAGNOSIS — N179 Acute kidney failure, unspecified: Secondary | ICD-10-CM | POA: Diagnosis not present

## 2018-04-01 DIAGNOSIS — M6281 Muscle weakness (generalized): Secondary | ICD-10-CM | POA: Diagnosis not present

## 2018-04-01 DIAGNOSIS — R296 Repeated falls: Secondary | ICD-10-CM | POA: Diagnosis not present

## 2018-04-02 DIAGNOSIS — I10 Essential (primary) hypertension: Secondary | ICD-10-CM | POA: Diagnosis not present

## 2018-04-02 DIAGNOSIS — E039 Hypothyroidism, unspecified: Secondary | ICD-10-CM | POA: Diagnosis not present

## 2018-04-02 DIAGNOSIS — R296 Repeated falls: Secondary | ICD-10-CM | POA: Diagnosis not present

## 2018-04-02 DIAGNOSIS — M6281 Muscle weakness (generalized): Secondary | ICD-10-CM | POA: Diagnosis not present

## 2018-04-02 DIAGNOSIS — Z79899 Other long term (current) drug therapy: Secondary | ICD-10-CM | POA: Diagnosis not present

## 2018-04-02 DIAGNOSIS — N179 Acute kidney failure, unspecified: Secondary | ICD-10-CM | POA: Diagnosis not present

## 2018-04-02 DIAGNOSIS — R2689 Other abnormalities of gait and mobility: Secondary | ICD-10-CM | POA: Diagnosis not present

## 2018-04-05 DIAGNOSIS — M6281 Muscle weakness (generalized): Secondary | ICD-10-CM | POA: Diagnosis not present

## 2018-04-05 DIAGNOSIS — H353133 Nonexudative age-related macular degeneration, bilateral, advanced atrophic without subfoveal involvement: Secondary | ICD-10-CM | POA: Diagnosis not present

## 2018-04-05 DIAGNOSIS — N179 Acute kidney failure, unspecified: Secondary | ICD-10-CM | POA: Diagnosis not present

## 2018-04-05 DIAGNOSIS — H04123 Dry eye syndrome of bilateral lacrimal glands: Secondary | ICD-10-CM | POA: Diagnosis not present

## 2018-04-05 DIAGNOSIS — R2689 Other abnormalities of gait and mobility: Secondary | ICD-10-CM | POA: Diagnosis not present

## 2018-04-05 DIAGNOSIS — R296 Repeated falls: Secondary | ICD-10-CM | POA: Diagnosis not present

## 2018-04-05 DIAGNOSIS — H524 Presbyopia: Secondary | ICD-10-CM | POA: Diagnosis not present

## 2018-04-05 DIAGNOSIS — Z961 Presence of intraocular lens: Secondary | ICD-10-CM | POA: Diagnosis not present

## 2018-04-06 DIAGNOSIS — R296 Repeated falls: Secondary | ICD-10-CM | POA: Diagnosis not present

## 2018-04-06 DIAGNOSIS — N179 Acute kidney failure, unspecified: Secondary | ICD-10-CM | POA: Diagnosis not present

## 2018-04-06 DIAGNOSIS — M6281 Muscle weakness (generalized): Secondary | ICD-10-CM | POA: Diagnosis not present

## 2018-04-06 DIAGNOSIS — R2689 Other abnormalities of gait and mobility: Secondary | ICD-10-CM | POA: Diagnosis not present

## 2018-04-07 DIAGNOSIS — R296 Repeated falls: Secondary | ICD-10-CM | POA: Diagnosis not present

## 2018-04-07 DIAGNOSIS — R2689 Other abnormalities of gait and mobility: Secondary | ICD-10-CM | POA: Diagnosis not present

## 2018-04-07 DIAGNOSIS — N179 Acute kidney failure, unspecified: Secondary | ICD-10-CM | POA: Diagnosis not present

## 2018-04-07 DIAGNOSIS — M6281 Muscle weakness (generalized): Secondary | ICD-10-CM | POA: Diagnosis not present

## 2018-04-08 DIAGNOSIS — R2689 Other abnormalities of gait and mobility: Secondary | ICD-10-CM | POA: Diagnosis not present

## 2018-04-08 DIAGNOSIS — N179 Acute kidney failure, unspecified: Secondary | ICD-10-CM | POA: Diagnosis not present

## 2018-04-08 DIAGNOSIS — M6281 Muscle weakness (generalized): Secondary | ICD-10-CM | POA: Diagnosis not present

## 2018-04-08 DIAGNOSIS — R296 Repeated falls: Secondary | ICD-10-CM | POA: Diagnosis not present

## 2018-04-09 DIAGNOSIS — R296 Repeated falls: Secondary | ICD-10-CM | POA: Diagnosis not present

## 2018-04-09 DIAGNOSIS — R2689 Other abnormalities of gait and mobility: Secondary | ICD-10-CM | POA: Diagnosis not present

## 2018-04-09 DIAGNOSIS — M6281 Muscle weakness (generalized): Secondary | ICD-10-CM | POA: Diagnosis not present

## 2018-04-09 DIAGNOSIS — N179 Acute kidney failure, unspecified: Secondary | ICD-10-CM | POA: Diagnosis not present

## 2018-04-12 DIAGNOSIS — N179 Acute kidney failure, unspecified: Secondary | ICD-10-CM | POA: Diagnosis not present

## 2018-04-12 DIAGNOSIS — M6281 Muscle weakness (generalized): Secondary | ICD-10-CM | POA: Diagnosis not present

## 2018-04-12 DIAGNOSIS — R2689 Other abnormalities of gait and mobility: Secondary | ICD-10-CM | POA: Diagnosis not present

## 2018-04-12 DIAGNOSIS — R296 Repeated falls: Secondary | ICD-10-CM | POA: Diagnosis not present

## 2018-04-13 DIAGNOSIS — N179 Acute kidney failure, unspecified: Secondary | ICD-10-CM | POA: Diagnosis not present

## 2018-04-13 DIAGNOSIS — R2689 Other abnormalities of gait and mobility: Secondary | ICD-10-CM | POA: Diagnosis not present

## 2018-04-13 DIAGNOSIS — M6281 Muscle weakness (generalized): Secondary | ICD-10-CM | POA: Diagnosis not present

## 2018-04-13 DIAGNOSIS — R296 Repeated falls: Secondary | ICD-10-CM | POA: Diagnosis not present

## 2018-04-14 DIAGNOSIS — M6281 Muscle weakness (generalized): Secondary | ICD-10-CM | POA: Diagnosis not present

## 2018-04-14 DIAGNOSIS — N179 Acute kidney failure, unspecified: Secondary | ICD-10-CM | POA: Diagnosis not present

## 2018-04-14 DIAGNOSIS — R296 Repeated falls: Secondary | ICD-10-CM | POA: Diagnosis not present

## 2018-04-14 DIAGNOSIS — R2689 Other abnormalities of gait and mobility: Secondary | ICD-10-CM | POA: Diagnosis not present

## 2018-04-15 DIAGNOSIS — R296 Repeated falls: Secondary | ICD-10-CM | POA: Diagnosis not present

## 2018-04-15 DIAGNOSIS — M6281 Muscle weakness (generalized): Secondary | ICD-10-CM | POA: Diagnosis not present

## 2018-04-15 DIAGNOSIS — R2689 Other abnormalities of gait and mobility: Secondary | ICD-10-CM | POA: Diagnosis not present

## 2018-04-15 DIAGNOSIS — N179 Acute kidney failure, unspecified: Secondary | ICD-10-CM | POA: Diagnosis not present

## 2018-04-16 DIAGNOSIS — R5381 Other malaise: Secondary | ICD-10-CM | POA: Diagnosis not present

## 2018-04-16 DIAGNOSIS — M6281 Muscle weakness (generalized): Secondary | ICD-10-CM | POA: Diagnosis not present

## 2018-04-16 DIAGNOSIS — R296 Repeated falls: Secondary | ICD-10-CM | POA: Diagnosis not present

## 2018-04-16 DIAGNOSIS — M150 Primary generalized (osteo)arthritis: Secondary | ICD-10-CM | POA: Diagnosis not present

## 2018-04-16 DIAGNOSIS — E039 Hypothyroidism, unspecified: Secondary | ICD-10-CM | POA: Diagnosis not present

## 2018-04-16 DIAGNOSIS — D638 Anemia in other chronic diseases classified elsewhere: Secondary | ICD-10-CM | POA: Diagnosis not present

## 2018-04-16 DIAGNOSIS — N179 Acute kidney failure, unspecified: Secondary | ICD-10-CM | POA: Diagnosis not present

## 2018-04-16 DIAGNOSIS — R2689 Other abnormalities of gait and mobility: Secondary | ICD-10-CM | POA: Diagnosis not present

## 2018-04-19 DIAGNOSIS — N179 Acute kidney failure, unspecified: Secondary | ICD-10-CM | POA: Diagnosis not present

## 2018-04-19 DIAGNOSIS — R296 Repeated falls: Secondary | ICD-10-CM | POA: Diagnosis not present

## 2018-04-19 DIAGNOSIS — M6281 Muscle weakness (generalized): Secondary | ICD-10-CM | POA: Diagnosis not present

## 2018-04-19 DIAGNOSIS — R2689 Other abnormalities of gait and mobility: Secondary | ICD-10-CM | POA: Diagnosis not present

## 2018-04-20 DIAGNOSIS — R2689 Other abnormalities of gait and mobility: Secondary | ICD-10-CM | POA: Diagnosis not present

## 2018-04-20 DIAGNOSIS — M6281 Muscle weakness (generalized): Secondary | ICD-10-CM | POA: Diagnosis not present

## 2018-04-20 DIAGNOSIS — R296 Repeated falls: Secondary | ICD-10-CM | POA: Diagnosis not present

## 2018-04-20 DIAGNOSIS — N179 Acute kidney failure, unspecified: Secondary | ICD-10-CM | POA: Diagnosis not present

## 2018-04-21 DIAGNOSIS — R296 Repeated falls: Secondary | ICD-10-CM | POA: Diagnosis not present

## 2018-04-21 DIAGNOSIS — M6281 Muscle weakness (generalized): Secondary | ICD-10-CM | POA: Diagnosis not present

## 2018-04-21 DIAGNOSIS — R2689 Other abnormalities of gait and mobility: Secondary | ICD-10-CM | POA: Diagnosis not present

## 2018-04-21 DIAGNOSIS — N179 Acute kidney failure, unspecified: Secondary | ICD-10-CM | POA: Diagnosis not present

## 2018-04-22 DIAGNOSIS — M1990 Unspecified osteoarthritis, unspecified site: Secondary | ICD-10-CM | POA: Diagnosis not present

## 2018-04-22 DIAGNOSIS — Z741 Need for assistance with personal care: Secondary | ICD-10-CM | POA: Diagnosis not present

## 2018-04-22 DIAGNOSIS — M6281 Muscle weakness (generalized): Secondary | ICD-10-CM | POA: Diagnosis not present

## 2018-04-22 DIAGNOSIS — R2689 Other abnormalities of gait and mobility: Secondary | ICD-10-CM | POA: Diagnosis not present

## 2018-04-22 DIAGNOSIS — R296 Repeated falls: Secondary | ICD-10-CM | POA: Diagnosis not present

## 2018-04-22 DIAGNOSIS — N179 Acute kidney failure, unspecified: Secondary | ICD-10-CM | POA: Diagnosis not present

## 2018-04-23 DIAGNOSIS — N179 Acute kidney failure, unspecified: Secondary | ICD-10-CM | POA: Diagnosis not present

## 2018-04-23 DIAGNOSIS — R2689 Other abnormalities of gait and mobility: Secondary | ICD-10-CM | POA: Diagnosis not present

## 2018-04-23 DIAGNOSIS — R296 Repeated falls: Secondary | ICD-10-CM | POA: Diagnosis not present

## 2018-04-23 DIAGNOSIS — M6281 Muscle weakness (generalized): Secondary | ICD-10-CM | POA: Diagnosis not present

## 2018-04-24 DIAGNOSIS — I10 Essential (primary) hypertension: Secondary | ICD-10-CM | POA: Diagnosis not present

## 2018-04-24 DIAGNOSIS — E039 Hypothyroidism, unspecified: Secondary | ICD-10-CM | POA: Diagnosis not present

## 2018-04-24 DIAGNOSIS — R5381 Other malaise: Secondary | ICD-10-CM | POA: Diagnosis not present

## 2018-04-24 DIAGNOSIS — M150 Primary generalized (osteo)arthritis: Secondary | ICD-10-CM | POA: Diagnosis not present

## 2018-04-26 DIAGNOSIS — M6281 Muscle weakness (generalized): Secondary | ICD-10-CM | POA: Diagnosis not present

## 2018-04-26 DIAGNOSIS — R1311 Dysphagia, oral phase: Secondary | ICD-10-CM | POA: Diagnosis not present

## 2018-04-26 DIAGNOSIS — Z741 Need for assistance with personal care: Secondary | ICD-10-CM | POA: Diagnosis not present

## 2018-04-26 DIAGNOSIS — R296 Repeated falls: Secondary | ICD-10-CM | POA: Diagnosis not present

## 2018-04-26 DIAGNOSIS — R2689 Other abnormalities of gait and mobility: Secondary | ICD-10-CM | POA: Diagnosis not present

## 2018-04-26 DIAGNOSIS — R627 Adult failure to thrive: Secondary | ICD-10-CM | POA: Diagnosis not present

## 2018-04-26 DIAGNOSIS — M1990 Unspecified osteoarthritis, unspecified site: Secondary | ICD-10-CM | POA: Diagnosis not present

## 2018-04-26 DIAGNOSIS — N179 Acute kidney failure, unspecified: Secondary | ICD-10-CM | POA: Diagnosis not present

## 2018-04-26 DIAGNOSIS — D649 Anemia, unspecified: Secondary | ICD-10-CM | POA: Diagnosis not present

## 2018-04-27 DIAGNOSIS — D649 Anemia, unspecified: Secondary | ICD-10-CM | POA: Diagnosis not present

## 2018-04-27 DIAGNOSIS — M1990 Unspecified osteoarthritis, unspecified site: Secondary | ICD-10-CM | POA: Diagnosis not present

## 2018-04-27 DIAGNOSIS — R1311 Dysphagia, oral phase: Secondary | ICD-10-CM | POA: Diagnosis not present

## 2018-04-27 DIAGNOSIS — N179 Acute kidney failure, unspecified: Secondary | ICD-10-CM | POA: Diagnosis not present

## 2018-04-27 DIAGNOSIS — R296 Repeated falls: Secondary | ICD-10-CM | POA: Diagnosis not present

## 2018-04-27 DIAGNOSIS — M6281 Muscle weakness (generalized): Secondary | ICD-10-CM | POA: Diagnosis not present

## 2018-04-27 DIAGNOSIS — R2689 Other abnormalities of gait and mobility: Secondary | ICD-10-CM | POA: Diagnosis not present

## 2018-04-27 DIAGNOSIS — R627 Adult failure to thrive: Secondary | ICD-10-CM | POA: Diagnosis not present

## 2018-04-27 DIAGNOSIS — Z741 Need for assistance with personal care: Secondary | ICD-10-CM | POA: Diagnosis not present

## 2018-04-28 DIAGNOSIS — R627 Adult failure to thrive: Secondary | ICD-10-CM | POA: Diagnosis not present

## 2018-04-28 DIAGNOSIS — D649 Anemia, unspecified: Secondary | ICD-10-CM | POA: Diagnosis not present

## 2018-04-28 DIAGNOSIS — R2689 Other abnormalities of gait and mobility: Secondary | ICD-10-CM | POA: Diagnosis not present

## 2018-04-28 DIAGNOSIS — Z741 Need for assistance with personal care: Secondary | ICD-10-CM | POA: Diagnosis not present

## 2018-04-28 DIAGNOSIS — R1311 Dysphagia, oral phase: Secondary | ICD-10-CM | POA: Diagnosis not present

## 2018-04-28 DIAGNOSIS — M1990 Unspecified osteoarthritis, unspecified site: Secondary | ICD-10-CM | POA: Diagnosis not present

## 2018-04-28 DIAGNOSIS — R296 Repeated falls: Secondary | ICD-10-CM | POA: Diagnosis not present

## 2018-04-28 DIAGNOSIS — M6281 Muscle weakness (generalized): Secondary | ICD-10-CM | POA: Diagnosis not present

## 2018-04-28 DIAGNOSIS — N179 Acute kidney failure, unspecified: Secondary | ICD-10-CM | POA: Diagnosis not present

## 2018-04-29 DIAGNOSIS — M1990 Unspecified osteoarthritis, unspecified site: Secondary | ICD-10-CM | POA: Diagnosis not present

## 2018-04-29 DIAGNOSIS — N179 Acute kidney failure, unspecified: Secondary | ICD-10-CM | POA: Diagnosis not present

## 2018-04-29 DIAGNOSIS — D649 Anemia, unspecified: Secondary | ICD-10-CM | POA: Diagnosis not present

## 2018-04-29 DIAGNOSIS — M6281 Muscle weakness (generalized): Secondary | ICD-10-CM | POA: Diagnosis not present

## 2018-04-29 DIAGNOSIS — R2689 Other abnormalities of gait and mobility: Secondary | ICD-10-CM | POA: Diagnosis not present

## 2018-04-29 DIAGNOSIS — R627 Adult failure to thrive: Secondary | ICD-10-CM | POA: Diagnosis not present

## 2018-04-29 DIAGNOSIS — Z741 Need for assistance with personal care: Secondary | ICD-10-CM | POA: Diagnosis not present

## 2018-04-29 DIAGNOSIS — R296 Repeated falls: Secondary | ICD-10-CM | POA: Diagnosis not present

## 2018-04-29 DIAGNOSIS — R1311 Dysphagia, oral phase: Secondary | ICD-10-CM | POA: Diagnosis not present

## 2018-04-30 DIAGNOSIS — R2689 Other abnormalities of gait and mobility: Secondary | ICD-10-CM | POA: Diagnosis not present

## 2018-04-30 DIAGNOSIS — R1311 Dysphagia, oral phase: Secondary | ICD-10-CM | POA: Diagnosis not present

## 2018-04-30 DIAGNOSIS — M6281 Muscle weakness (generalized): Secondary | ICD-10-CM | POA: Diagnosis not present

## 2018-04-30 DIAGNOSIS — R627 Adult failure to thrive: Secondary | ICD-10-CM | POA: Diagnosis not present

## 2018-04-30 DIAGNOSIS — M1990 Unspecified osteoarthritis, unspecified site: Secondary | ICD-10-CM | POA: Diagnosis not present

## 2018-04-30 DIAGNOSIS — N179 Acute kidney failure, unspecified: Secondary | ICD-10-CM | POA: Diagnosis not present

## 2018-04-30 DIAGNOSIS — Z741 Need for assistance with personal care: Secondary | ICD-10-CM | POA: Diagnosis not present

## 2018-04-30 DIAGNOSIS — R296 Repeated falls: Secondary | ICD-10-CM | POA: Diagnosis not present

## 2018-04-30 DIAGNOSIS — D649 Anemia, unspecified: Secondary | ICD-10-CM | POA: Diagnosis not present

## 2018-05-02 DIAGNOSIS — R296 Repeated falls: Secondary | ICD-10-CM | POA: Diagnosis not present

## 2018-05-02 DIAGNOSIS — N179 Acute kidney failure, unspecified: Secondary | ICD-10-CM | POA: Diagnosis not present

## 2018-05-02 DIAGNOSIS — M1990 Unspecified osteoarthritis, unspecified site: Secondary | ICD-10-CM | POA: Diagnosis not present

## 2018-05-02 DIAGNOSIS — D649 Anemia, unspecified: Secondary | ICD-10-CM | POA: Diagnosis not present

## 2018-05-02 DIAGNOSIS — R627 Adult failure to thrive: Secondary | ICD-10-CM | POA: Diagnosis not present

## 2018-05-02 DIAGNOSIS — Z741 Need for assistance with personal care: Secondary | ICD-10-CM | POA: Diagnosis not present

## 2018-05-02 DIAGNOSIS — R2689 Other abnormalities of gait and mobility: Secondary | ICD-10-CM | POA: Diagnosis not present

## 2018-05-02 DIAGNOSIS — M6281 Muscle weakness (generalized): Secondary | ICD-10-CM | POA: Diagnosis not present

## 2018-05-02 DIAGNOSIS — R1311 Dysphagia, oral phase: Secondary | ICD-10-CM | POA: Diagnosis not present

## 2018-05-03 DIAGNOSIS — R296 Repeated falls: Secondary | ICD-10-CM | POA: Diagnosis not present

## 2018-05-03 DIAGNOSIS — D649 Anemia, unspecified: Secondary | ICD-10-CM | POA: Diagnosis not present

## 2018-05-03 DIAGNOSIS — M1990 Unspecified osteoarthritis, unspecified site: Secondary | ICD-10-CM | POA: Diagnosis not present

## 2018-05-03 DIAGNOSIS — Z741 Need for assistance with personal care: Secondary | ICD-10-CM | POA: Diagnosis not present

## 2018-05-03 DIAGNOSIS — R627 Adult failure to thrive: Secondary | ICD-10-CM | POA: Diagnosis not present

## 2018-05-03 DIAGNOSIS — R2689 Other abnormalities of gait and mobility: Secondary | ICD-10-CM | POA: Diagnosis not present

## 2018-05-03 DIAGNOSIS — R1311 Dysphagia, oral phase: Secondary | ICD-10-CM | POA: Diagnosis not present

## 2018-05-03 DIAGNOSIS — M6281 Muscle weakness (generalized): Secondary | ICD-10-CM | POA: Diagnosis not present

## 2018-05-03 DIAGNOSIS — N179 Acute kidney failure, unspecified: Secondary | ICD-10-CM | POA: Diagnosis not present

## 2018-05-04 DIAGNOSIS — N179 Acute kidney failure, unspecified: Secondary | ICD-10-CM | POA: Diagnosis not present

## 2018-05-04 DIAGNOSIS — M6281 Muscle weakness (generalized): Secondary | ICD-10-CM | POA: Diagnosis not present

## 2018-05-04 DIAGNOSIS — R627 Adult failure to thrive: Secondary | ICD-10-CM | POA: Diagnosis not present

## 2018-05-04 DIAGNOSIS — R2689 Other abnormalities of gait and mobility: Secondary | ICD-10-CM | POA: Diagnosis not present

## 2018-05-04 DIAGNOSIS — M1990 Unspecified osteoarthritis, unspecified site: Secondary | ICD-10-CM | POA: Diagnosis not present

## 2018-05-04 DIAGNOSIS — R1311 Dysphagia, oral phase: Secondary | ICD-10-CM | POA: Diagnosis not present

## 2018-05-04 DIAGNOSIS — R296 Repeated falls: Secondary | ICD-10-CM | POA: Diagnosis not present

## 2018-05-04 DIAGNOSIS — D649 Anemia, unspecified: Secondary | ICD-10-CM | POA: Diagnosis not present

## 2018-05-04 DIAGNOSIS — Z741 Need for assistance with personal care: Secondary | ICD-10-CM | POA: Diagnosis not present

## 2018-05-05 DIAGNOSIS — R2689 Other abnormalities of gait and mobility: Secondary | ICD-10-CM | POA: Diagnosis not present

## 2018-05-05 DIAGNOSIS — R296 Repeated falls: Secondary | ICD-10-CM | POA: Diagnosis not present

## 2018-05-05 DIAGNOSIS — Z741 Need for assistance with personal care: Secondary | ICD-10-CM | POA: Diagnosis not present

## 2018-05-05 DIAGNOSIS — R627 Adult failure to thrive: Secondary | ICD-10-CM | POA: Diagnosis not present

## 2018-05-05 DIAGNOSIS — M6281 Muscle weakness (generalized): Secondary | ICD-10-CM | POA: Diagnosis not present

## 2018-05-05 DIAGNOSIS — M1990 Unspecified osteoarthritis, unspecified site: Secondary | ICD-10-CM | POA: Diagnosis not present

## 2018-05-05 DIAGNOSIS — R1311 Dysphagia, oral phase: Secondary | ICD-10-CM | POA: Diagnosis not present

## 2018-05-05 DIAGNOSIS — N179 Acute kidney failure, unspecified: Secondary | ICD-10-CM | POA: Diagnosis not present

## 2018-05-05 DIAGNOSIS — D649 Anemia, unspecified: Secondary | ICD-10-CM | POA: Diagnosis not present

## 2018-05-06 DIAGNOSIS — D649 Anemia, unspecified: Secondary | ICD-10-CM | POA: Diagnosis not present

## 2018-05-06 DIAGNOSIS — M6281 Muscle weakness (generalized): Secondary | ICD-10-CM | POA: Diagnosis not present

## 2018-05-06 DIAGNOSIS — M1990 Unspecified osteoarthritis, unspecified site: Secondary | ICD-10-CM | POA: Diagnosis not present

## 2018-05-06 DIAGNOSIS — R627 Adult failure to thrive: Secondary | ICD-10-CM | POA: Diagnosis not present

## 2018-05-06 DIAGNOSIS — R1311 Dysphagia, oral phase: Secondary | ICD-10-CM | POA: Diagnosis not present

## 2018-05-06 DIAGNOSIS — R2689 Other abnormalities of gait and mobility: Secondary | ICD-10-CM | POA: Diagnosis not present

## 2018-05-06 DIAGNOSIS — N179 Acute kidney failure, unspecified: Secondary | ICD-10-CM | POA: Diagnosis not present

## 2018-05-06 DIAGNOSIS — Z741 Need for assistance with personal care: Secondary | ICD-10-CM | POA: Diagnosis not present

## 2018-05-06 DIAGNOSIS — R296 Repeated falls: Secondary | ICD-10-CM | POA: Diagnosis not present

## 2018-05-07 DIAGNOSIS — R2689 Other abnormalities of gait and mobility: Secondary | ICD-10-CM | POA: Diagnosis not present

## 2018-05-07 DIAGNOSIS — D649 Anemia, unspecified: Secondary | ICD-10-CM | POA: Diagnosis not present

## 2018-05-07 DIAGNOSIS — R1311 Dysphagia, oral phase: Secondary | ICD-10-CM | POA: Diagnosis not present

## 2018-05-07 DIAGNOSIS — M1990 Unspecified osteoarthritis, unspecified site: Secondary | ICD-10-CM | POA: Diagnosis not present

## 2018-05-07 DIAGNOSIS — Z741 Need for assistance with personal care: Secondary | ICD-10-CM | POA: Diagnosis not present

## 2018-05-07 DIAGNOSIS — M6281 Muscle weakness (generalized): Secondary | ICD-10-CM | POA: Diagnosis not present

## 2018-05-07 DIAGNOSIS — N179 Acute kidney failure, unspecified: Secondary | ICD-10-CM | POA: Diagnosis not present

## 2018-05-07 DIAGNOSIS — R627 Adult failure to thrive: Secondary | ICD-10-CM | POA: Diagnosis not present

## 2018-05-07 DIAGNOSIS — R296 Repeated falls: Secondary | ICD-10-CM | POA: Diagnosis not present

## 2018-05-10 DIAGNOSIS — M1990 Unspecified osteoarthritis, unspecified site: Secondary | ICD-10-CM | POA: Diagnosis not present

## 2018-05-10 DIAGNOSIS — N179 Acute kidney failure, unspecified: Secondary | ICD-10-CM | POA: Diagnosis not present

## 2018-05-10 DIAGNOSIS — M6281 Muscle weakness (generalized): Secondary | ICD-10-CM | POA: Diagnosis not present

## 2018-05-10 DIAGNOSIS — R627 Adult failure to thrive: Secondary | ICD-10-CM | POA: Diagnosis not present

## 2018-05-10 DIAGNOSIS — R1311 Dysphagia, oral phase: Secondary | ICD-10-CM | POA: Diagnosis not present

## 2018-05-10 DIAGNOSIS — R296 Repeated falls: Secondary | ICD-10-CM | POA: Diagnosis not present

## 2018-05-10 DIAGNOSIS — R2689 Other abnormalities of gait and mobility: Secondary | ICD-10-CM | POA: Diagnosis not present

## 2018-05-10 DIAGNOSIS — D649 Anemia, unspecified: Secondary | ICD-10-CM | POA: Diagnosis not present

## 2018-05-10 DIAGNOSIS — Z741 Need for assistance with personal care: Secondary | ICD-10-CM | POA: Diagnosis not present

## 2018-05-11 DIAGNOSIS — R2689 Other abnormalities of gait and mobility: Secondary | ICD-10-CM | POA: Diagnosis not present

## 2018-05-11 DIAGNOSIS — M6281 Muscle weakness (generalized): Secondary | ICD-10-CM | POA: Diagnosis not present

## 2018-05-11 DIAGNOSIS — R627 Adult failure to thrive: Secondary | ICD-10-CM | POA: Diagnosis not present

## 2018-05-11 DIAGNOSIS — N179 Acute kidney failure, unspecified: Secondary | ICD-10-CM | POA: Diagnosis not present

## 2018-05-11 DIAGNOSIS — M1990 Unspecified osteoarthritis, unspecified site: Secondary | ICD-10-CM | POA: Diagnosis not present

## 2018-05-11 DIAGNOSIS — R296 Repeated falls: Secondary | ICD-10-CM | POA: Diagnosis not present

## 2018-05-11 DIAGNOSIS — R1311 Dysphagia, oral phase: Secondary | ICD-10-CM | POA: Diagnosis not present

## 2018-05-11 DIAGNOSIS — D649 Anemia, unspecified: Secondary | ICD-10-CM | POA: Diagnosis not present

## 2018-05-11 DIAGNOSIS — Z741 Need for assistance with personal care: Secondary | ICD-10-CM | POA: Diagnosis not present

## 2018-05-12 DIAGNOSIS — Z741 Need for assistance with personal care: Secondary | ICD-10-CM | POA: Diagnosis not present

## 2018-05-12 DIAGNOSIS — R627 Adult failure to thrive: Secondary | ICD-10-CM | POA: Diagnosis not present

## 2018-05-12 DIAGNOSIS — R1311 Dysphagia, oral phase: Secondary | ICD-10-CM | POA: Diagnosis not present

## 2018-05-12 DIAGNOSIS — N179 Acute kidney failure, unspecified: Secondary | ICD-10-CM | POA: Diagnosis not present

## 2018-05-12 DIAGNOSIS — R296 Repeated falls: Secondary | ICD-10-CM | POA: Diagnosis not present

## 2018-05-12 DIAGNOSIS — M6281 Muscle weakness (generalized): Secondary | ICD-10-CM | POA: Diagnosis not present

## 2018-05-12 DIAGNOSIS — M1990 Unspecified osteoarthritis, unspecified site: Secondary | ICD-10-CM | POA: Diagnosis not present

## 2018-05-12 DIAGNOSIS — R2689 Other abnormalities of gait and mobility: Secondary | ICD-10-CM | POA: Diagnosis not present

## 2018-05-12 DIAGNOSIS — D649 Anemia, unspecified: Secondary | ICD-10-CM | POA: Diagnosis not present

## 2018-05-13 DIAGNOSIS — M1990 Unspecified osteoarthritis, unspecified site: Secondary | ICD-10-CM | POA: Diagnosis not present

## 2018-05-13 DIAGNOSIS — N179 Acute kidney failure, unspecified: Secondary | ICD-10-CM | POA: Diagnosis not present

## 2018-05-13 DIAGNOSIS — Z741 Need for assistance with personal care: Secondary | ICD-10-CM | POA: Diagnosis not present

## 2018-05-13 DIAGNOSIS — D649 Anemia, unspecified: Secondary | ICD-10-CM | POA: Diagnosis not present

## 2018-05-13 DIAGNOSIS — R296 Repeated falls: Secondary | ICD-10-CM | POA: Diagnosis not present

## 2018-05-13 DIAGNOSIS — R2689 Other abnormalities of gait and mobility: Secondary | ICD-10-CM | POA: Diagnosis not present

## 2018-05-13 DIAGNOSIS — R1311 Dysphagia, oral phase: Secondary | ICD-10-CM | POA: Diagnosis not present

## 2018-05-13 DIAGNOSIS — M6281 Muscle weakness (generalized): Secondary | ICD-10-CM | POA: Diagnosis not present

## 2018-05-13 DIAGNOSIS — R627 Adult failure to thrive: Secondary | ICD-10-CM | POA: Diagnosis not present

## 2018-05-14 DIAGNOSIS — R1311 Dysphagia, oral phase: Secondary | ICD-10-CM | POA: Diagnosis not present

## 2018-05-14 DIAGNOSIS — Z741 Need for assistance with personal care: Secondary | ICD-10-CM | POA: Diagnosis not present

## 2018-05-14 DIAGNOSIS — M1990 Unspecified osteoarthritis, unspecified site: Secondary | ICD-10-CM | POA: Diagnosis not present

## 2018-05-14 DIAGNOSIS — R627 Adult failure to thrive: Secondary | ICD-10-CM | POA: Diagnosis not present

## 2018-05-14 DIAGNOSIS — N179 Acute kidney failure, unspecified: Secondary | ICD-10-CM | POA: Diagnosis not present

## 2018-05-14 DIAGNOSIS — R296 Repeated falls: Secondary | ICD-10-CM | POA: Diagnosis not present

## 2018-05-14 DIAGNOSIS — D649 Anemia, unspecified: Secondary | ICD-10-CM | POA: Diagnosis not present

## 2018-05-14 DIAGNOSIS — M6281 Muscle weakness (generalized): Secondary | ICD-10-CM | POA: Diagnosis not present

## 2018-05-14 DIAGNOSIS — R5381 Other malaise: Secondary | ICD-10-CM | POA: Diagnosis not present

## 2018-05-14 DIAGNOSIS — E039 Hypothyroidism, unspecified: Secondary | ICD-10-CM | POA: Diagnosis not present

## 2018-05-14 DIAGNOSIS — I10 Essential (primary) hypertension: Secondary | ICD-10-CM | POA: Diagnosis not present

## 2018-05-14 DIAGNOSIS — R2689 Other abnormalities of gait and mobility: Secondary | ICD-10-CM | POA: Diagnosis not present

## 2018-05-14 DIAGNOSIS — M150 Primary generalized (osteo)arthritis: Secondary | ICD-10-CM | POA: Diagnosis not present

## 2018-05-17 DIAGNOSIS — M6281 Muscle weakness (generalized): Secondary | ICD-10-CM | POA: Diagnosis not present

## 2018-05-17 DIAGNOSIS — Z741 Need for assistance with personal care: Secondary | ICD-10-CM | POA: Diagnosis not present

## 2018-05-17 DIAGNOSIS — D649 Anemia, unspecified: Secondary | ICD-10-CM | POA: Diagnosis not present

## 2018-05-17 DIAGNOSIS — R296 Repeated falls: Secondary | ICD-10-CM | POA: Diagnosis not present

## 2018-05-17 DIAGNOSIS — M1990 Unspecified osteoarthritis, unspecified site: Secondary | ICD-10-CM | POA: Diagnosis not present

## 2018-05-17 DIAGNOSIS — N179 Acute kidney failure, unspecified: Secondary | ICD-10-CM | POA: Diagnosis not present

## 2018-05-17 DIAGNOSIS — R1311 Dysphagia, oral phase: Secondary | ICD-10-CM | POA: Diagnosis not present

## 2018-05-17 DIAGNOSIS — R627 Adult failure to thrive: Secondary | ICD-10-CM | POA: Diagnosis not present

## 2018-05-17 DIAGNOSIS — R2689 Other abnormalities of gait and mobility: Secondary | ICD-10-CM | POA: Diagnosis not present

## 2018-05-18 DIAGNOSIS — M1990 Unspecified osteoarthritis, unspecified site: Secondary | ICD-10-CM | POA: Diagnosis not present

## 2018-05-18 DIAGNOSIS — R627 Adult failure to thrive: Secondary | ICD-10-CM | POA: Diagnosis not present

## 2018-05-18 DIAGNOSIS — N179 Acute kidney failure, unspecified: Secondary | ICD-10-CM | POA: Diagnosis not present

## 2018-05-18 DIAGNOSIS — D649 Anemia, unspecified: Secondary | ICD-10-CM | POA: Diagnosis not present

## 2018-05-18 DIAGNOSIS — M6281 Muscle weakness (generalized): Secondary | ICD-10-CM | POA: Diagnosis not present

## 2018-05-18 DIAGNOSIS — R1311 Dysphagia, oral phase: Secondary | ICD-10-CM | POA: Diagnosis not present

## 2018-05-18 DIAGNOSIS — R296 Repeated falls: Secondary | ICD-10-CM | POA: Diagnosis not present

## 2018-05-18 DIAGNOSIS — Z741 Need for assistance with personal care: Secondary | ICD-10-CM | POA: Diagnosis not present

## 2018-05-18 DIAGNOSIS — R2689 Other abnormalities of gait and mobility: Secondary | ICD-10-CM | POA: Diagnosis not present

## 2018-05-19 DIAGNOSIS — R296 Repeated falls: Secondary | ICD-10-CM | POA: Diagnosis not present

## 2018-05-19 DIAGNOSIS — M1990 Unspecified osteoarthritis, unspecified site: Secondary | ICD-10-CM | POA: Diagnosis not present

## 2018-05-19 DIAGNOSIS — R627 Adult failure to thrive: Secondary | ICD-10-CM | POA: Diagnosis not present

## 2018-05-19 DIAGNOSIS — N179 Acute kidney failure, unspecified: Secondary | ICD-10-CM | POA: Diagnosis not present

## 2018-05-19 DIAGNOSIS — R2689 Other abnormalities of gait and mobility: Secondary | ICD-10-CM | POA: Diagnosis not present

## 2018-05-19 DIAGNOSIS — Z741 Need for assistance with personal care: Secondary | ICD-10-CM | POA: Diagnosis not present

## 2018-05-19 DIAGNOSIS — M6281 Muscle weakness (generalized): Secondary | ICD-10-CM | POA: Diagnosis not present

## 2018-05-19 DIAGNOSIS — R1311 Dysphagia, oral phase: Secondary | ICD-10-CM | POA: Diagnosis not present

## 2018-05-19 DIAGNOSIS — D649 Anemia, unspecified: Secondary | ICD-10-CM | POA: Diagnosis not present

## 2018-05-20 DIAGNOSIS — M6281 Muscle weakness (generalized): Secondary | ICD-10-CM | POA: Diagnosis not present

## 2018-05-20 DIAGNOSIS — E039 Hypothyroidism, unspecified: Secondary | ICD-10-CM | POA: Diagnosis not present

## 2018-05-20 DIAGNOSIS — D649 Anemia, unspecified: Secondary | ICD-10-CM | POA: Diagnosis not present

## 2018-05-20 DIAGNOSIS — I10 Essential (primary) hypertension: Secondary | ICD-10-CM | POA: Diagnosis not present

## 2018-05-20 DIAGNOSIS — R5381 Other malaise: Secondary | ICD-10-CM | POA: Diagnosis not present

## 2018-05-20 DIAGNOSIS — D638 Anemia in other chronic diseases classified elsewhere: Secondary | ICD-10-CM | POA: Diagnosis not present

## 2018-05-20 DIAGNOSIS — Z741 Need for assistance with personal care: Secondary | ICD-10-CM | POA: Diagnosis not present

## 2018-05-20 DIAGNOSIS — R627 Adult failure to thrive: Secondary | ICD-10-CM | POA: Diagnosis not present

## 2018-05-20 DIAGNOSIS — R296 Repeated falls: Secondary | ICD-10-CM | POA: Diagnosis not present

## 2018-05-20 DIAGNOSIS — N179 Acute kidney failure, unspecified: Secondary | ICD-10-CM | POA: Diagnosis not present

## 2018-05-20 DIAGNOSIS — M1990 Unspecified osteoarthritis, unspecified site: Secondary | ICD-10-CM | POA: Diagnosis not present

## 2018-05-20 DIAGNOSIS — R2689 Other abnormalities of gait and mobility: Secondary | ICD-10-CM | POA: Diagnosis not present

## 2018-05-20 DIAGNOSIS — R1311 Dysphagia, oral phase: Secondary | ICD-10-CM | POA: Diagnosis not present

## 2018-05-21 DIAGNOSIS — R627 Adult failure to thrive: Secondary | ICD-10-CM | POA: Diagnosis not present

## 2018-05-21 DIAGNOSIS — M6281 Muscle weakness (generalized): Secondary | ICD-10-CM | POA: Diagnosis not present

## 2018-05-21 DIAGNOSIS — M1990 Unspecified osteoarthritis, unspecified site: Secondary | ICD-10-CM | POA: Diagnosis not present

## 2018-05-21 DIAGNOSIS — N179 Acute kidney failure, unspecified: Secondary | ICD-10-CM | POA: Diagnosis not present

## 2018-05-21 DIAGNOSIS — R296 Repeated falls: Secondary | ICD-10-CM | POA: Diagnosis not present

## 2018-05-21 DIAGNOSIS — D649 Anemia, unspecified: Secondary | ICD-10-CM | POA: Diagnosis not present

## 2018-05-21 DIAGNOSIS — R1311 Dysphagia, oral phase: Secondary | ICD-10-CM | POA: Diagnosis not present

## 2018-05-21 DIAGNOSIS — Z741 Need for assistance with personal care: Secondary | ICD-10-CM | POA: Diagnosis not present

## 2018-05-21 DIAGNOSIS — R2689 Other abnormalities of gait and mobility: Secondary | ICD-10-CM | POA: Diagnosis not present

## 2018-05-22 DIAGNOSIS — N179 Acute kidney failure, unspecified: Secondary | ICD-10-CM | POA: Diagnosis not present

## 2018-05-22 DIAGNOSIS — M6281 Muscle weakness (generalized): Secondary | ICD-10-CM | POA: Diagnosis not present

## 2018-05-22 DIAGNOSIS — D649 Anemia, unspecified: Secondary | ICD-10-CM | POA: Diagnosis not present

## 2018-05-22 DIAGNOSIS — R296 Repeated falls: Secondary | ICD-10-CM | POA: Diagnosis not present

## 2018-05-22 DIAGNOSIS — M1990 Unspecified osteoarthritis, unspecified site: Secondary | ICD-10-CM | POA: Diagnosis not present

## 2018-05-22 DIAGNOSIS — R2689 Other abnormalities of gait and mobility: Secondary | ICD-10-CM | POA: Diagnosis not present

## 2018-05-22 DIAGNOSIS — R627 Adult failure to thrive: Secondary | ICD-10-CM | POA: Diagnosis not present

## 2018-05-22 DIAGNOSIS — Z741 Need for assistance with personal care: Secondary | ICD-10-CM | POA: Diagnosis not present

## 2018-05-22 DIAGNOSIS — R1311 Dysphagia, oral phase: Secondary | ICD-10-CM | POA: Diagnosis not present

## 2018-05-24 DIAGNOSIS — M6281 Muscle weakness (generalized): Secondary | ICD-10-CM | POA: Diagnosis not present

## 2018-05-24 DIAGNOSIS — D649 Anemia, unspecified: Secondary | ICD-10-CM | POA: Diagnosis not present

## 2018-05-24 DIAGNOSIS — R1311 Dysphagia, oral phase: Secondary | ICD-10-CM | POA: Diagnosis not present

## 2018-05-24 DIAGNOSIS — R296 Repeated falls: Secondary | ICD-10-CM | POA: Diagnosis not present

## 2018-05-24 DIAGNOSIS — N179 Acute kidney failure, unspecified: Secondary | ICD-10-CM | POA: Diagnosis not present

## 2018-05-24 DIAGNOSIS — M1990 Unspecified osteoarthritis, unspecified site: Secondary | ICD-10-CM | POA: Diagnosis not present

## 2018-05-24 DIAGNOSIS — Z741 Need for assistance with personal care: Secondary | ICD-10-CM | POA: Diagnosis not present

## 2018-05-24 DIAGNOSIS — R627 Adult failure to thrive: Secondary | ICD-10-CM | POA: Diagnosis not present

## 2018-05-24 DIAGNOSIS — R2689 Other abnormalities of gait and mobility: Secondary | ICD-10-CM | POA: Diagnosis not present

## 2018-05-25 DIAGNOSIS — R2689 Other abnormalities of gait and mobility: Secondary | ICD-10-CM | POA: Diagnosis not present

## 2018-05-25 DIAGNOSIS — Z741 Need for assistance with personal care: Secondary | ICD-10-CM | POA: Diagnosis not present

## 2018-05-25 DIAGNOSIS — M6281 Muscle weakness (generalized): Secondary | ICD-10-CM | POA: Diagnosis not present

## 2018-05-25 DIAGNOSIS — N179 Acute kidney failure, unspecified: Secondary | ICD-10-CM | POA: Diagnosis not present

## 2018-05-25 DIAGNOSIS — R627 Adult failure to thrive: Secondary | ICD-10-CM | POA: Diagnosis not present

## 2018-05-25 DIAGNOSIS — M1990 Unspecified osteoarthritis, unspecified site: Secondary | ICD-10-CM | POA: Diagnosis not present

## 2018-05-25 DIAGNOSIS — R296 Repeated falls: Secondary | ICD-10-CM | POA: Diagnosis not present

## 2018-05-25 DIAGNOSIS — D649 Anemia, unspecified: Secondary | ICD-10-CM | POA: Diagnosis not present

## 2018-05-25 DIAGNOSIS — R1311 Dysphagia, oral phase: Secondary | ICD-10-CM | POA: Diagnosis not present

## 2018-05-27 DIAGNOSIS — E559 Vitamin D deficiency, unspecified: Secondary | ICD-10-CM | POA: Diagnosis not present

## 2018-05-27 DIAGNOSIS — E039 Hypothyroidism, unspecified: Secondary | ICD-10-CM | POA: Diagnosis not present

## 2018-05-27 DIAGNOSIS — D649 Anemia, unspecified: Secondary | ICD-10-CM | POA: Diagnosis not present

## 2018-05-27 DIAGNOSIS — E119 Type 2 diabetes mellitus without complications: Secondary | ICD-10-CM | POA: Diagnosis not present

## 2018-05-31 DIAGNOSIS — R2689 Other abnormalities of gait and mobility: Secondary | ICD-10-CM | POA: Diagnosis not present

## 2018-05-31 DIAGNOSIS — N179 Acute kidney failure, unspecified: Secondary | ICD-10-CM | POA: Diagnosis not present

## 2018-05-31 DIAGNOSIS — D649 Anemia, unspecified: Secondary | ICD-10-CM | POA: Diagnosis not present

## 2018-05-31 DIAGNOSIS — R296 Repeated falls: Secondary | ICD-10-CM | POA: Diagnosis not present

## 2018-05-31 DIAGNOSIS — M6281 Muscle weakness (generalized): Secondary | ICD-10-CM | POA: Diagnosis not present

## 2018-05-31 DIAGNOSIS — Z741 Need for assistance with personal care: Secondary | ICD-10-CM | POA: Diagnosis not present

## 2018-05-31 DIAGNOSIS — R627 Adult failure to thrive: Secondary | ICD-10-CM | POA: Diagnosis not present

## 2018-05-31 DIAGNOSIS — M1990 Unspecified osteoarthritis, unspecified site: Secondary | ICD-10-CM | POA: Diagnosis not present

## 2018-05-31 DIAGNOSIS — R1311 Dysphagia, oral phase: Secondary | ICD-10-CM | POA: Diagnosis not present

## 2018-06-01 DIAGNOSIS — Z741 Need for assistance with personal care: Secondary | ICD-10-CM | POA: Diagnosis not present

## 2018-06-01 DIAGNOSIS — R2689 Other abnormalities of gait and mobility: Secondary | ICD-10-CM | POA: Diagnosis not present

## 2018-06-01 DIAGNOSIS — R627 Adult failure to thrive: Secondary | ICD-10-CM | POA: Diagnosis not present

## 2018-06-01 DIAGNOSIS — M6281 Muscle weakness (generalized): Secondary | ICD-10-CM | POA: Diagnosis not present

## 2018-06-01 DIAGNOSIS — R1311 Dysphagia, oral phase: Secondary | ICD-10-CM | POA: Diagnosis not present

## 2018-06-01 DIAGNOSIS — D649 Anemia, unspecified: Secondary | ICD-10-CM | POA: Diagnosis not present

## 2018-06-01 DIAGNOSIS — R296 Repeated falls: Secondary | ICD-10-CM | POA: Diagnosis not present

## 2018-06-01 DIAGNOSIS — M1990 Unspecified osteoarthritis, unspecified site: Secondary | ICD-10-CM | POA: Diagnosis not present

## 2018-06-01 DIAGNOSIS — N179 Acute kidney failure, unspecified: Secondary | ICD-10-CM | POA: Diagnosis not present

## 2018-06-02 DIAGNOSIS — R2689 Other abnormalities of gait and mobility: Secondary | ICD-10-CM | POA: Diagnosis not present

## 2018-06-02 DIAGNOSIS — Z741 Need for assistance with personal care: Secondary | ICD-10-CM | POA: Diagnosis not present

## 2018-06-02 DIAGNOSIS — D649 Anemia, unspecified: Secondary | ICD-10-CM | POA: Diagnosis not present

## 2018-06-02 DIAGNOSIS — R1311 Dysphagia, oral phase: Secondary | ICD-10-CM | POA: Diagnosis not present

## 2018-06-02 DIAGNOSIS — R627 Adult failure to thrive: Secondary | ICD-10-CM | POA: Diagnosis not present

## 2018-06-02 DIAGNOSIS — N179 Acute kidney failure, unspecified: Secondary | ICD-10-CM | POA: Diagnosis not present

## 2018-06-02 DIAGNOSIS — M6281 Muscle weakness (generalized): Secondary | ICD-10-CM | POA: Diagnosis not present

## 2018-06-02 DIAGNOSIS — M1990 Unspecified osteoarthritis, unspecified site: Secondary | ICD-10-CM | POA: Diagnosis not present

## 2018-06-02 DIAGNOSIS — R296 Repeated falls: Secondary | ICD-10-CM | POA: Diagnosis not present

## 2018-06-03 DIAGNOSIS — R296 Repeated falls: Secondary | ICD-10-CM | POA: Diagnosis not present

## 2018-06-03 DIAGNOSIS — D649 Anemia, unspecified: Secondary | ICD-10-CM | POA: Diagnosis not present

## 2018-06-03 DIAGNOSIS — M1990 Unspecified osteoarthritis, unspecified site: Secondary | ICD-10-CM | POA: Diagnosis not present

## 2018-06-03 DIAGNOSIS — M6281 Muscle weakness (generalized): Secondary | ICD-10-CM | POA: Diagnosis not present

## 2018-06-03 DIAGNOSIS — R2689 Other abnormalities of gait and mobility: Secondary | ICD-10-CM | POA: Diagnosis not present

## 2018-06-03 DIAGNOSIS — Z741 Need for assistance with personal care: Secondary | ICD-10-CM | POA: Diagnosis not present

## 2018-06-03 DIAGNOSIS — N179 Acute kidney failure, unspecified: Secondary | ICD-10-CM | POA: Diagnosis not present

## 2018-06-03 DIAGNOSIS — R627 Adult failure to thrive: Secondary | ICD-10-CM | POA: Diagnosis not present

## 2018-06-03 DIAGNOSIS — R1311 Dysphagia, oral phase: Secondary | ICD-10-CM | POA: Diagnosis not present

## 2018-06-04 DIAGNOSIS — N179 Acute kidney failure, unspecified: Secondary | ICD-10-CM | POA: Diagnosis not present

## 2018-06-04 DIAGNOSIS — Z741 Need for assistance with personal care: Secondary | ICD-10-CM | POA: Diagnosis not present

## 2018-06-04 DIAGNOSIS — R2689 Other abnormalities of gait and mobility: Secondary | ICD-10-CM | POA: Diagnosis not present

## 2018-06-04 DIAGNOSIS — R1311 Dysphagia, oral phase: Secondary | ICD-10-CM | POA: Diagnosis not present

## 2018-06-04 DIAGNOSIS — R296 Repeated falls: Secondary | ICD-10-CM | POA: Diagnosis not present

## 2018-06-04 DIAGNOSIS — D649 Anemia, unspecified: Secondary | ICD-10-CM | POA: Diagnosis not present

## 2018-06-04 DIAGNOSIS — M6281 Muscle weakness (generalized): Secondary | ICD-10-CM | POA: Diagnosis not present

## 2018-06-04 DIAGNOSIS — R627 Adult failure to thrive: Secondary | ICD-10-CM | POA: Diagnosis not present

## 2018-06-04 DIAGNOSIS — M1990 Unspecified osteoarthritis, unspecified site: Secondary | ICD-10-CM | POA: Diagnosis not present

## 2018-06-06 DIAGNOSIS — D649 Anemia, unspecified: Secondary | ICD-10-CM | POA: Diagnosis not present

## 2018-06-06 DIAGNOSIS — N179 Acute kidney failure, unspecified: Secondary | ICD-10-CM | POA: Diagnosis not present

## 2018-06-06 DIAGNOSIS — R2689 Other abnormalities of gait and mobility: Secondary | ICD-10-CM | POA: Diagnosis not present

## 2018-06-06 DIAGNOSIS — Z741 Need for assistance with personal care: Secondary | ICD-10-CM | POA: Diagnosis not present

## 2018-06-06 DIAGNOSIS — M6281 Muscle weakness (generalized): Secondary | ICD-10-CM | POA: Diagnosis not present

## 2018-06-06 DIAGNOSIS — M1990 Unspecified osteoarthritis, unspecified site: Secondary | ICD-10-CM | POA: Diagnosis not present

## 2018-06-06 DIAGNOSIS — R627 Adult failure to thrive: Secondary | ICD-10-CM | POA: Diagnosis not present

## 2018-06-06 DIAGNOSIS — R1311 Dysphagia, oral phase: Secondary | ICD-10-CM | POA: Diagnosis not present

## 2018-06-06 DIAGNOSIS — R296 Repeated falls: Secondary | ICD-10-CM | POA: Diagnosis not present

## 2018-06-07 DIAGNOSIS — D649 Anemia, unspecified: Secondary | ICD-10-CM | POA: Diagnosis not present

## 2018-06-07 DIAGNOSIS — N179 Acute kidney failure, unspecified: Secondary | ICD-10-CM | POA: Diagnosis not present

## 2018-06-07 DIAGNOSIS — R1311 Dysphagia, oral phase: Secondary | ICD-10-CM | POA: Diagnosis not present

## 2018-06-07 DIAGNOSIS — R2689 Other abnormalities of gait and mobility: Secondary | ICD-10-CM | POA: Diagnosis not present

## 2018-06-07 DIAGNOSIS — M1990 Unspecified osteoarthritis, unspecified site: Secondary | ICD-10-CM | POA: Diagnosis not present

## 2018-06-07 DIAGNOSIS — Z741 Need for assistance with personal care: Secondary | ICD-10-CM | POA: Diagnosis not present

## 2018-06-07 DIAGNOSIS — R296 Repeated falls: Secondary | ICD-10-CM | POA: Diagnosis not present

## 2018-06-07 DIAGNOSIS — M6281 Muscle weakness (generalized): Secondary | ICD-10-CM | POA: Diagnosis not present

## 2018-06-07 DIAGNOSIS — R627 Adult failure to thrive: Secondary | ICD-10-CM | POA: Diagnosis not present

## 2018-06-08 DIAGNOSIS — R296 Repeated falls: Secondary | ICD-10-CM | POA: Diagnosis not present

## 2018-06-08 DIAGNOSIS — N179 Acute kidney failure, unspecified: Secondary | ICD-10-CM | POA: Diagnosis not present

## 2018-06-08 DIAGNOSIS — R2689 Other abnormalities of gait and mobility: Secondary | ICD-10-CM | POA: Diagnosis not present

## 2018-06-08 DIAGNOSIS — M6281 Muscle weakness (generalized): Secondary | ICD-10-CM | POA: Diagnosis not present

## 2018-06-08 DIAGNOSIS — R627 Adult failure to thrive: Secondary | ICD-10-CM | POA: Diagnosis not present

## 2018-06-08 DIAGNOSIS — R1311 Dysphagia, oral phase: Secondary | ICD-10-CM | POA: Diagnosis not present

## 2018-06-08 DIAGNOSIS — Z741 Need for assistance with personal care: Secondary | ICD-10-CM | POA: Diagnosis not present

## 2018-06-08 DIAGNOSIS — M1990 Unspecified osteoarthritis, unspecified site: Secondary | ICD-10-CM | POA: Diagnosis not present

## 2018-06-08 DIAGNOSIS — D649 Anemia, unspecified: Secondary | ICD-10-CM | POA: Diagnosis not present

## 2018-06-09 DIAGNOSIS — Z741 Need for assistance with personal care: Secondary | ICD-10-CM | POA: Diagnosis not present

## 2018-06-09 DIAGNOSIS — R627 Adult failure to thrive: Secondary | ICD-10-CM | POA: Diagnosis not present

## 2018-06-09 DIAGNOSIS — M1990 Unspecified osteoarthritis, unspecified site: Secondary | ICD-10-CM | POA: Diagnosis not present

## 2018-06-09 DIAGNOSIS — N179 Acute kidney failure, unspecified: Secondary | ICD-10-CM | POA: Diagnosis not present

## 2018-06-09 DIAGNOSIS — R1311 Dysphagia, oral phase: Secondary | ICD-10-CM | POA: Diagnosis not present

## 2018-06-09 DIAGNOSIS — M6281 Muscle weakness (generalized): Secondary | ICD-10-CM | POA: Diagnosis not present

## 2018-06-09 DIAGNOSIS — D649 Anemia, unspecified: Secondary | ICD-10-CM | POA: Diagnosis not present

## 2018-06-09 DIAGNOSIS — R296 Repeated falls: Secondary | ICD-10-CM | POA: Diagnosis not present

## 2018-06-09 DIAGNOSIS — R2689 Other abnormalities of gait and mobility: Secondary | ICD-10-CM | POA: Diagnosis not present

## 2018-06-10 DIAGNOSIS — D649 Anemia, unspecified: Secondary | ICD-10-CM | POA: Diagnosis not present

## 2018-06-10 DIAGNOSIS — R2689 Other abnormalities of gait and mobility: Secondary | ICD-10-CM | POA: Diagnosis not present

## 2018-06-10 DIAGNOSIS — M6281 Muscle weakness (generalized): Secondary | ICD-10-CM | POA: Diagnosis not present

## 2018-06-10 DIAGNOSIS — M1990 Unspecified osteoarthritis, unspecified site: Secondary | ICD-10-CM | POA: Diagnosis not present

## 2018-06-10 DIAGNOSIS — R1311 Dysphagia, oral phase: Secondary | ICD-10-CM | POA: Diagnosis not present

## 2018-06-10 DIAGNOSIS — R627 Adult failure to thrive: Secondary | ICD-10-CM | POA: Diagnosis not present

## 2018-06-10 DIAGNOSIS — R296 Repeated falls: Secondary | ICD-10-CM | POA: Diagnosis not present

## 2018-06-10 DIAGNOSIS — Z741 Need for assistance with personal care: Secondary | ICD-10-CM | POA: Diagnosis not present

## 2018-06-10 DIAGNOSIS — N179 Acute kidney failure, unspecified: Secondary | ICD-10-CM | POA: Diagnosis not present

## 2018-06-14 DIAGNOSIS — R627 Adult failure to thrive: Secondary | ICD-10-CM | POA: Diagnosis not present

## 2018-06-14 DIAGNOSIS — N179 Acute kidney failure, unspecified: Secondary | ICD-10-CM | POA: Diagnosis not present

## 2018-06-14 DIAGNOSIS — M1990 Unspecified osteoarthritis, unspecified site: Secondary | ICD-10-CM | POA: Diagnosis not present

## 2018-06-14 DIAGNOSIS — M6281 Muscle weakness (generalized): Secondary | ICD-10-CM | POA: Diagnosis not present

## 2018-06-14 DIAGNOSIS — R2689 Other abnormalities of gait and mobility: Secondary | ICD-10-CM | POA: Diagnosis not present

## 2018-06-14 DIAGNOSIS — Z741 Need for assistance with personal care: Secondary | ICD-10-CM | POA: Diagnosis not present

## 2018-06-14 DIAGNOSIS — R1311 Dysphagia, oral phase: Secondary | ICD-10-CM | POA: Diagnosis not present

## 2018-06-14 DIAGNOSIS — R296 Repeated falls: Secondary | ICD-10-CM | POA: Diagnosis not present

## 2018-06-14 DIAGNOSIS — D649 Anemia, unspecified: Secondary | ICD-10-CM | POA: Diagnosis not present

## 2018-06-15 DIAGNOSIS — D649 Anemia, unspecified: Secondary | ICD-10-CM | POA: Diagnosis not present

## 2018-06-15 DIAGNOSIS — M6281 Muscle weakness (generalized): Secondary | ICD-10-CM | POA: Diagnosis not present

## 2018-06-15 DIAGNOSIS — R2689 Other abnormalities of gait and mobility: Secondary | ICD-10-CM | POA: Diagnosis not present

## 2018-06-15 DIAGNOSIS — M1990 Unspecified osteoarthritis, unspecified site: Secondary | ICD-10-CM | POA: Diagnosis not present

## 2018-06-15 DIAGNOSIS — N179 Acute kidney failure, unspecified: Secondary | ICD-10-CM | POA: Diagnosis not present

## 2018-06-15 DIAGNOSIS — R296 Repeated falls: Secondary | ICD-10-CM | POA: Diagnosis not present

## 2018-06-15 DIAGNOSIS — R627 Adult failure to thrive: Secondary | ICD-10-CM | POA: Diagnosis not present

## 2018-06-15 DIAGNOSIS — R1311 Dysphagia, oral phase: Secondary | ICD-10-CM | POA: Diagnosis not present

## 2018-06-15 DIAGNOSIS — Z741 Need for assistance with personal care: Secondary | ICD-10-CM | POA: Diagnosis not present

## 2018-06-16 DIAGNOSIS — D649 Anemia, unspecified: Secondary | ICD-10-CM | POA: Diagnosis not present

## 2018-06-16 DIAGNOSIS — N179 Acute kidney failure, unspecified: Secondary | ICD-10-CM | POA: Diagnosis not present

## 2018-06-16 DIAGNOSIS — Z741 Need for assistance with personal care: Secondary | ICD-10-CM | POA: Diagnosis not present

## 2018-06-16 DIAGNOSIS — R2689 Other abnormalities of gait and mobility: Secondary | ICD-10-CM | POA: Diagnosis not present

## 2018-06-16 DIAGNOSIS — M1990 Unspecified osteoarthritis, unspecified site: Secondary | ICD-10-CM | POA: Diagnosis not present

## 2018-06-16 DIAGNOSIS — R627 Adult failure to thrive: Secondary | ICD-10-CM | POA: Diagnosis not present

## 2018-06-16 DIAGNOSIS — M6281 Muscle weakness (generalized): Secondary | ICD-10-CM | POA: Diagnosis not present

## 2018-06-16 DIAGNOSIS — R296 Repeated falls: Secondary | ICD-10-CM | POA: Diagnosis not present

## 2018-06-16 DIAGNOSIS — R1311 Dysphagia, oral phase: Secondary | ICD-10-CM | POA: Diagnosis not present

## 2018-06-17 DIAGNOSIS — E039 Hypothyroidism, unspecified: Secondary | ICD-10-CM | POA: Diagnosis not present

## 2018-06-17 DIAGNOSIS — M6281 Muscle weakness (generalized): Secondary | ICD-10-CM | POA: Diagnosis not present

## 2018-06-17 DIAGNOSIS — R296 Repeated falls: Secondary | ICD-10-CM | POA: Diagnosis not present

## 2018-06-17 DIAGNOSIS — N179 Acute kidney failure, unspecified: Secondary | ICD-10-CM | POA: Diagnosis not present

## 2018-06-17 DIAGNOSIS — R627 Adult failure to thrive: Secondary | ICD-10-CM | POA: Diagnosis not present

## 2018-06-17 DIAGNOSIS — D638 Anemia in other chronic diseases classified elsewhere: Secondary | ICD-10-CM | POA: Diagnosis not present

## 2018-06-17 DIAGNOSIS — R2689 Other abnormalities of gait and mobility: Secondary | ICD-10-CM | POA: Diagnosis not present

## 2018-06-17 DIAGNOSIS — D649 Anemia, unspecified: Secondary | ICD-10-CM | POA: Diagnosis not present

## 2018-06-17 DIAGNOSIS — M1990 Unspecified osteoarthritis, unspecified site: Secondary | ICD-10-CM | POA: Diagnosis not present

## 2018-06-17 DIAGNOSIS — R1311 Dysphagia, oral phase: Secondary | ICD-10-CM | POA: Diagnosis not present

## 2018-06-17 DIAGNOSIS — Z741 Need for assistance with personal care: Secondary | ICD-10-CM | POA: Diagnosis not present

## 2018-06-17 DIAGNOSIS — R5381 Other malaise: Secondary | ICD-10-CM | POA: Diagnosis not present

## 2018-06-17 DIAGNOSIS — I10 Essential (primary) hypertension: Secondary | ICD-10-CM | POA: Diagnosis not present

## 2018-06-18 DIAGNOSIS — E039 Hypothyroidism, unspecified: Secondary | ICD-10-CM | POA: Diagnosis not present

## 2018-06-18 DIAGNOSIS — R627 Adult failure to thrive: Secondary | ICD-10-CM | POA: Diagnosis not present

## 2018-06-18 DIAGNOSIS — M6281 Muscle weakness (generalized): Secondary | ICD-10-CM | POA: Diagnosis not present

## 2018-06-18 DIAGNOSIS — N179 Acute kidney failure, unspecified: Secondary | ICD-10-CM | POA: Diagnosis not present

## 2018-06-18 DIAGNOSIS — R296 Repeated falls: Secondary | ICD-10-CM | POA: Diagnosis not present

## 2018-06-18 DIAGNOSIS — I10 Essential (primary) hypertension: Secondary | ICD-10-CM | POA: Diagnosis not present

## 2018-06-18 DIAGNOSIS — R5381 Other malaise: Secondary | ICD-10-CM | POA: Diagnosis not present

## 2018-06-18 DIAGNOSIS — R1311 Dysphagia, oral phase: Secondary | ICD-10-CM | POA: Diagnosis not present

## 2018-06-18 DIAGNOSIS — M1990 Unspecified osteoarthritis, unspecified site: Secondary | ICD-10-CM | POA: Diagnosis not present

## 2018-06-18 DIAGNOSIS — Z741 Need for assistance with personal care: Secondary | ICD-10-CM | POA: Diagnosis not present

## 2018-06-18 DIAGNOSIS — D638 Anemia in other chronic diseases classified elsewhere: Secondary | ICD-10-CM | POA: Diagnosis not present

## 2018-06-18 DIAGNOSIS — D649 Anemia, unspecified: Secondary | ICD-10-CM | POA: Diagnosis not present

## 2018-06-18 DIAGNOSIS — R2689 Other abnormalities of gait and mobility: Secondary | ICD-10-CM | POA: Diagnosis not present

## 2018-06-20 DIAGNOSIS — M6281 Muscle weakness (generalized): Secondary | ICD-10-CM | POA: Diagnosis not present

## 2018-06-20 DIAGNOSIS — D649 Anemia, unspecified: Secondary | ICD-10-CM | POA: Diagnosis not present

## 2018-06-20 DIAGNOSIS — N179 Acute kidney failure, unspecified: Secondary | ICD-10-CM | POA: Diagnosis not present

## 2018-06-20 DIAGNOSIS — R296 Repeated falls: Secondary | ICD-10-CM | POA: Diagnosis not present

## 2018-06-20 DIAGNOSIS — R2689 Other abnormalities of gait and mobility: Secondary | ICD-10-CM | POA: Diagnosis not present

## 2018-06-20 DIAGNOSIS — R627 Adult failure to thrive: Secondary | ICD-10-CM | POA: Diagnosis not present

## 2018-06-20 DIAGNOSIS — M1990 Unspecified osteoarthritis, unspecified site: Secondary | ICD-10-CM | POA: Diagnosis not present

## 2018-06-20 DIAGNOSIS — R1311 Dysphagia, oral phase: Secondary | ICD-10-CM | POA: Diagnosis not present

## 2018-06-20 DIAGNOSIS — Z741 Need for assistance with personal care: Secondary | ICD-10-CM | POA: Diagnosis not present

## 2018-06-21 DIAGNOSIS — R627 Adult failure to thrive: Secondary | ICD-10-CM | POA: Diagnosis not present

## 2018-06-21 DIAGNOSIS — R296 Repeated falls: Secondary | ICD-10-CM | POA: Diagnosis not present

## 2018-06-21 DIAGNOSIS — R1311 Dysphagia, oral phase: Secondary | ICD-10-CM | POA: Diagnosis not present

## 2018-06-21 DIAGNOSIS — M1990 Unspecified osteoarthritis, unspecified site: Secondary | ICD-10-CM | POA: Diagnosis not present

## 2018-06-21 DIAGNOSIS — R2689 Other abnormalities of gait and mobility: Secondary | ICD-10-CM | POA: Diagnosis not present

## 2018-06-21 DIAGNOSIS — N179 Acute kidney failure, unspecified: Secondary | ICD-10-CM | POA: Diagnosis not present

## 2018-06-21 DIAGNOSIS — Z741 Need for assistance with personal care: Secondary | ICD-10-CM | POA: Diagnosis not present

## 2018-06-21 DIAGNOSIS — M6281 Muscle weakness (generalized): Secondary | ICD-10-CM | POA: Diagnosis not present

## 2018-06-21 DIAGNOSIS — D649 Anemia, unspecified: Secondary | ICD-10-CM | POA: Diagnosis not present

## 2018-06-22 DIAGNOSIS — R2689 Other abnormalities of gait and mobility: Secondary | ICD-10-CM | POA: Diagnosis not present

## 2018-06-22 DIAGNOSIS — M6281 Muscle weakness (generalized): Secondary | ICD-10-CM | POA: Diagnosis not present

## 2018-06-22 DIAGNOSIS — N179 Acute kidney failure, unspecified: Secondary | ICD-10-CM | POA: Diagnosis not present

## 2018-06-22 DIAGNOSIS — M1990 Unspecified osteoarthritis, unspecified site: Secondary | ICD-10-CM | POA: Diagnosis not present

## 2018-06-22 DIAGNOSIS — R627 Adult failure to thrive: Secondary | ICD-10-CM | POA: Diagnosis not present

## 2018-06-22 DIAGNOSIS — Z741 Need for assistance with personal care: Secondary | ICD-10-CM | POA: Diagnosis not present

## 2018-06-22 DIAGNOSIS — D649 Anemia, unspecified: Secondary | ICD-10-CM | POA: Diagnosis not present

## 2018-06-22 DIAGNOSIS — R296 Repeated falls: Secondary | ICD-10-CM | POA: Diagnosis not present

## 2018-06-22 DIAGNOSIS — R1311 Dysphagia, oral phase: Secondary | ICD-10-CM | POA: Diagnosis not present

## 2018-06-24 DIAGNOSIS — R296 Repeated falls: Secondary | ICD-10-CM | POA: Diagnosis not present

## 2018-06-24 DIAGNOSIS — M6281 Muscle weakness (generalized): Secondary | ICD-10-CM | POA: Diagnosis not present

## 2018-06-24 DIAGNOSIS — R2689 Other abnormalities of gait and mobility: Secondary | ICD-10-CM | POA: Diagnosis not present

## 2018-06-24 DIAGNOSIS — R1311 Dysphagia, oral phase: Secondary | ICD-10-CM | POA: Diagnosis not present

## 2018-06-24 DIAGNOSIS — M1990 Unspecified osteoarthritis, unspecified site: Secondary | ICD-10-CM | POA: Diagnosis not present

## 2018-06-24 DIAGNOSIS — R627 Adult failure to thrive: Secondary | ICD-10-CM | POA: Diagnosis not present

## 2018-06-24 DIAGNOSIS — N179 Acute kidney failure, unspecified: Secondary | ICD-10-CM | POA: Diagnosis not present

## 2018-06-24 DIAGNOSIS — Z741 Need for assistance with personal care: Secondary | ICD-10-CM | POA: Diagnosis not present

## 2018-06-24 DIAGNOSIS — D649 Anemia, unspecified: Secondary | ICD-10-CM | POA: Diagnosis not present

## 2018-06-27 DIAGNOSIS — M6281 Muscle weakness (generalized): Secondary | ICD-10-CM | POA: Diagnosis not present

## 2018-06-27 DIAGNOSIS — Z741 Need for assistance with personal care: Secondary | ICD-10-CM | POA: Diagnosis not present

## 2018-06-27 DIAGNOSIS — M1990 Unspecified osteoarthritis, unspecified site: Secondary | ICD-10-CM | POA: Diagnosis not present

## 2018-06-28 DIAGNOSIS — M1990 Unspecified osteoarthritis, unspecified site: Secondary | ICD-10-CM | POA: Diagnosis not present

## 2018-06-28 DIAGNOSIS — M6281 Muscle weakness (generalized): Secondary | ICD-10-CM | POA: Diagnosis not present

## 2018-06-28 DIAGNOSIS — Z741 Need for assistance with personal care: Secondary | ICD-10-CM | POA: Diagnosis not present

## 2018-06-29 DIAGNOSIS — M6281 Muscle weakness (generalized): Secondary | ICD-10-CM | POA: Diagnosis not present

## 2018-06-29 DIAGNOSIS — M1990 Unspecified osteoarthritis, unspecified site: Secondary | ICD-10-CM | POA: Diagnosis not present

## 2018-06-29 DIAGNOSIS — Z741 Need for assistance with personal care: Secondary | ICD-10-CM | POA: Diagnosis not present

## 2018-06-30 DIAGNOSIS — M1990 Unspecified osteoarthritis, unspecified site: Secondary | ICD-10-CM | POA: Diagnosis not present

## 2018-06-30 DIAGNOSIS — M6281 Muscle weakness (generalized): Secondary | ICD-10-CM | POA: Diagnosis not present

## 2018-06-30 DIAGNOSIS — Z741 Need for assistance with personal care: Secondary | ICD-10-CM | POA: Diagnosis not present

## 2018-07-01 DIAGNOSIS — M1990 Unspecified osteoarthritis, unspecified site: Secondary | ICD-10-CM | POA: Diagnosis not present

## 2018-07-01 DIAGNOSIS — M6281 Muscle weakness (generalized): Secondary | ICD-10-CM | POA: Diagnosis not present

## 2018-07-01 DIAGNOSIS — Z741 Need for assistance with personal care: Secondary | ICD-10-CM | POA: Diagnosis not present

## 2018-07-02 DIAGNOSIS — M1990 Unspecified osteoarthritis, unspecified site: Secondary | ICD-10-CM | POA: Diagnosis not present

## 2018-07-02 DIAGNOSIS — M6281 Muscle weakness (generalized): Secondary | ICD-10-CM | POA: Diagnosis not present

## 2018-07-02 DIAGNOSIS — Z741 Need for assistance with personal care: Secondary | ICD-10-CM | POA: Diagnosis not present

## 2018-07-07 DIAGNOSIS — M1990 Unspecified osteoarthritis, unspecified site: Secondary | ICD-10-CM | POA: Diagnosis not present

## 2018-07-07 DIAGNOSIS — Z741 Need for assistance with personal care: Secondary | ICD-10-CM | POA: Diagnosis not present

## 2018-07-07 DIAGNOSIS — M6281 Muscle weakness (generalized): Secondary | ICD-10-CM | POA: Diagnosis not present

## 2018-07-08 DIAGNOSIS — Z741 Need for assistance with personal care: Secondary | ICD-10-CM | POA: Diagnosis not present

## 2018-07-08 DIAGNOSIS — M6281 Muscle weakness (generalized): Secondary | ICD-10-CM | POA: Diagnosis not present

## 2018-07-08 DIAGNOSIS — M1990 Unspecified osteoarthritis, unspecified site: Secondary | ICD-10-CM | POA: Diagnosis not present

## 2018-07-12 DIAGNOSIS — M1990 Unspecified osteoarthritis, unspecified site: Secondary | ICD-10-CM | POA: Diagnosis not present

## 2018-07-12 DIAGNOSIS — M6281 Muscle weakness (generalized): Secondary | ICD-10-CM | POA: Diagnosis not present

## 2018-07-12 DIAGNOSIS — Z741 Need for assistance with personal care: Secondary | ICD-10-CM | POA: Diagnosis not present

## 2018-07-13 DIAGNOSIS — M6281 Muscle weakness (generalized): Secondary | ICD-10-CM | POA: Diagnosis not present

## 2018-07-13 DIAGNOSIS — M1990 Unspecified osteoarthritis, unspecified site: Secondary | ICD-10-CM | POA: Diagnosis not present

## 2018-07-13 DIAGNOSIS — Z741 Need for assistance with personal care: Secondary | ICD-10-CM | POA: Diagnosis not present

## 2018-07-14 DIAGNOSIS — Z741 Need for assistance with personal care: Secondary | ICD-10-CM | POA: Diagnosis not present

## 2018-07-14 DIAGNOSIS — M1990 Unspecified osteoarthritis, unspecified site: Secondary | ICD-10-CM | POA: Diagnosis not present

## 2018-07-14 DIAGNOSIS — M6281 Muscle weakness (generalized): Secondary | ICD-10-CM | POA: Diagnosis not present

## 2018-07-15 DIAGNOSIS — G2581 Restless legs syndrome: Secondary | ICD-10-CM | POA: Diagnosis not present

## 2018-07-15 DIAGNOSIS — I10 Essential (primary) hypertension: Secondary | ICD-10-CM | POA: Diagnosis not present

## 2018-07-15 DIAGNOSIS — Z741 Need for assistance with personal care: Secondary | ICD-10-CM | POA: Diagnosis not present

## 2018-07-15 DIAGNOSIS — M1990 Unspecified osteoarthritis, unspecified site: Secondary | ICD-10-CM | POA: Diagnosis not present

## 2018-07-15 DIAGNOSIS — R5381 Other malaise: Secondary | ICD-10-CM | POA: Diagnosis not present

## 2018-07-15 DIAGNOSIS — B356 Tinea cruris: Secondary | ICD-10-CM | POA: Diagnosis not present

## 2018-07-15 DIAGNOSIS — M6281 Muscle weakness (generalized): Secondary | ICD-10-CM | POA: Diagnosis not present

## 2018-07-21 DIAGNOSIS — M8949 Other hypertrophic osteoarthropathy, multiple sites: Secondary | ICD-10-CM | POA: Diagnosis not present

## 2018-07-21 DIAGNOSIS — I10 Essential (primary) hypertension: Secondary | ICD-10-CM | POA: Diagnosis not present

## 2018-07-21 DIAGNOSIS — B356 Tinea cruris: Secondary | ICD-10-CM | POA: Diagnosis not present

## 2018-07-21 DIAGNOSIS — G2581 Restless legs syndrome: Secondary | ICD-10-CM | POA: Diagnosis not present

## 2018-08-12 DIAGNOSIS — M8949 Other hypertrophic osteoarthropathy, multiple sites: Secondary | ICD-10-CM | POA: Diagnosis not present

## 2018-08-12 DIAGNOSIS — G2581 Restless legs syndrome: Secondary | ICD-10-CM | POA: Diagnosis not present

## 2018-08-12 DIAGNOSIS — D638 Anemia in other chronic diseases classified elsewhere: Secondary | ICD-10-CM | POA: Diagnosis not present

## 2018-08-12 DIAGNOSIS — B356 Tinea cruris: Secondary | ICD-10-CM | POA: Diagnosis not present

## 2018-08-17 DIAGNOSIS — M8949 Other hypertrophic osteoarthropathy, multiple sites: Secondary | ICD-10-CM | POA: Diagnosis not present

## 2018-08-17 DIAGNOSIS — G2581 Restless legs syndrome: Secondary | ICD-10-CM | POA: Diagnosis not present

## 2018-08-17 DIAGNOSIS — D638 Anemia in other chronic diseases classified elsewhere: Secondary | ICD-10-CM | POA: Diagnosis not present

## 2018-08-17 DIAGNOSIS — B356 Tinea cruris: Secondary | ICD-10-CM | POA: Diagnosis not present

## 2018-09-09 DIAGNOSIS — R5381 Other malaise: Secondary | ICD-10-CM | POA: Diagnosis not present

## 2018-09-09 DIAGNOSIS — D638 Anemia in other chronic diseases classified elsewhere: Secondary | ICD-10-CM | POA: Diagnosis not present

## 2018-09-09 DIAGNOSIS — B356 Tinea cruris: Secondary | ICD-10-CM | POA: Diagnosis not present

## 2018-09-09 DIAGNOSIS — H353120 Nonexudative age-related macular degeneration, left eye, stage unspecified: Secondary | ICD-10-CM | POA: Diagnosis not present

## 2018-09-10 DIAGNOSIS — F4323 Adjustment disorder with mixed anxiety and depressed mood: Secondary | ICD-10-CM | POA: Diagnosis not present

## 2018-09-13 DIAGNOSIS — F0280 Dementia in other diseases classified elsewhere without behavioral disturbance: Secondary | ICD-10-CM | POA: Diagnosis not present

## 2018-09-13 DIAGNOSIS — R488 Other symbolic dysfunctions: Secondary | ICD-10-CM | POA: Diagnosis not present

## 2018-09-13 DIAGNOSIS — R627 Adult failure to thrive: Secondary | ICD-10-CM | POA: Diagnosis not present

## 2018-09-13 DIAGNOSIS — H3530 Unspecified macular degeneration: Secondary | ICD-10-CM | POA: Diagnosis not present

## 2018-09-13 DIAGNOSIS — M25562 Pain in left knee: Secondary | ICD-10-CM | POA: Diagnosis not present

## 2018-09-13 DIAGNOSIS — R2689 Other abnormalities of gait and mobility: Secondary | ICD-10-CM | POA: Diagnosis not present

## 2018-09-13 DIAGNOSIS — M6281 Muscle weakness (generalized): Secondary | ICD-10-CM | POA: Diagnosis not present

## 2018-09-13 DIAGNOSIS — G301 Alzheimer's disease with late onset: Secondary | ICD-10-CM | POA: Diagnosis not present

## 2018-09-14 DIAGNOSIS — H3530 Unspecified macular degeneration: Secondary | ICD-10-CM | POA: Diagnosis not present

## 2018-09-14 DIAGNOSIS — M6281 Muscle weakness (generalized): Secondary | ICD-10-CM | POA: Diagnosis not present

## 2018-09-14 DIAGNOSIS — F0280 Dementia in other diseases classified elsewhere without behavioral disturbance: Secondary | ICD-10-CM | POA: Diagnosis not present

## 2018-09-14 DIAGNOSIS — R488 Other symbolic dysfunctions: Secondary | ICD-10-CM | POA: Diagnosis not present

## 2018-09-14 DIAGNOSIS — G301 Alzheimer's disease with late onset: Secondary | ICD-10-CM | POA: Diagnosis not present

## 2018-09-14 DIAGNOSIS — M25562 Pain in left knee: Secondary | ICD-10-CM | POA: Diagnosis not present

## 2018-09-14 DIAGNOSIS — R2689 Other abnormalities of gait and mobility: Secondary | ICD-10-CM | POA: Diagnosis not present

## 2018-09-14 DIAGNOSIS — R627 Adult failure to thrive: Secondary | ICD-10-CM | POA: Diagnosis not present

## 2018-09-15 DIAGNOSIS — F0280 Dementia in other diseases classified elsewhere without behavioral disturbance: Secondary | ICD-10-CM | POA: Diagnosis not present

## 2018-09-15 DIAGNOSIS — G301 Alzheimer's disease with late onset: Secondary | ICD-10-CM | POA: Diagnosis not present

## 2018-09-15 DIAGNOSIS — M6281 Muscle weakness (generalized): Secondary | ICD-10-CM | POA: Diagnosis not present

## 2018-09-15 DIAGNOSIS — R627 Adult failure to thrive: Secondary | ICD-10-CM | POA: Diagnosis not present

## 2018-09-15 DIAGNOSIS — R2689 Other abnormalities of gait and mobility: Secondary | ICD-10-CM | POA: Diagnosis not present

## 2018-09-15 DIAGNOSIS — F4323 Adjustment disorder with mixed anxiety and depressed mood: Secondary | ICD-10-CM | POA: Diagnosis not present

## 2018-09-15 DIAGNOSIS — H3530 Unspecified macular degeneration: Secondary | ICD-10-CM | POA: Diagnosis not present

## 2018-09-15 DIAGNOSIS — M25562 Pain in left knee: Secondary | ICD-10-CM | POA: Diagnosis not present

## 2018-09-15 DIAGNOSIS — R488 Other symbolic dysfunctions: Secondary | ICD-10-CM | POA: Diagnosis not present

## 2018-09-16 DIAGNOSIS — M25562 Pain in left knee: Secondary | ICD-10-CM | POA: Diagnosis not present

## 2018-09-16 DIAGNOSIS — R627 Adult failure to thrive: Secondary | ICD-10-CM | POA: Diagnosis not present

## 2018-09-16 DIAGNOSIS — H3530 Unspecified macular degeneration: Secondary | ICD-10-CM | POA: Diagnosis not present

## 2018-09-16 DIAGNOSIS — R488 Other symbolic dysfunctions: Secondary | ICD-10-CM | POA: Diagnosis not present

## 2018-09-16 DIAGNOSIS — M6281 Muscle weakness (generalized): Secondary | ICD-10-CM | POA: Diagnosis not present

## 2018-09-16 DIAGNOSIS — G301 Alzheimer's disease with late onset: Secondary | ICD-10-CM | POA: Diagnosis not present

## 2018-09-16 DIAGNOSIS — F5101 Primary insomnia: Secondary | ICD-10-CM | POA: Diagnosis not present

## 2018-09-16 DIAGNOSIS — F0280 Dementia in other diseases classified elsewhere without behavioral disturbance: Secondary | ICD-10-CM | POA: Diagnosis not present

## 2018-09-16 DIAGNOSIS — R2689 Other abnormalities of gait and mobility: Secondary | ICD-10-CM | POA: Diagnosis not present

## 2018-09-17 DIAGNOSIS — F0280 Dementia in other diseases classified elsewhere without behavioral disturbance: Secondary | ICD-10-CM | POA: Diagnosis not present

## 2018-09-17 DIAGNOSIS — M8949 Other hypertrophic osteoarthropathy, multiple sites: Secondary | ICD-10-CM | POA: Diagnosis not present

## 2018-09-17 DIAGNOSIS — R2689 Other abnormalities of gait and mobility: Secondary | ICD-10-CM | POA: Diagnosis not present

## 2018-09-17 DIAGNOSIS — E039 Hypothyroidism, unspecified: Secondary | ICD-10-CM | POA: Diagnosis not present

## 2018-09-17 DIAGNOSIS — G301 Alzheimer's disease with late onset: Secondary | ICD-10-CM | POA: Diagnosis not present

## 2018-09-17 DIAGNOSIS — D638 Anemia in other chronic diseases classified elsewhere: Secondary | ICD-10-CM | POA: Diagnosis not present

## 2018-09-17 DIAGNOSIS — H3530 Unspecified macular degeneration: Secondary | ICD-10-CM | POA: Diagnosis not present

## 2018-09-17 DIAGNOSIS — R488 Other symbolic dysfunctions: Secondary | ICD-10-CM | POA: Diagnosis not present

## 2018-09-17 DIAGNOSIS — R5381 Other malaise: Secondary | ICD-10-CM | POA: Diagnosis not present

## 2018-09-17 DIAGNOSIS — M25562 Pain in left knee: Secondary | ICD-10-CM | POA: Diagnosis not present

## 2018-09-17 DIAGNOSIS — M6281 Muscle weakness (generalized): Secondary | ICD-10-CM | POA: Diagnosis not present

## 2018-09-17 DIAGNOSIS — R627 Adult failure to thrive: Secondary | ICD-10-CM | POA: Diagnosis not present

## 2018-09-20 DIAGNOSIS — M25562 Pain in left knee: Secondary | ICD-10-CM | POA: Diagnosis not present

## 2018-09-20 DIAGNOSIS — F0280 Dementia in other diseases classified elsewhere without behavioral disturbance: Secondary | ICD-10-CM | POA: Diagnosis not present

## 2018-09-20 DIAGNOSIS — G301 Alzheimer's disease with late onset: Secondary | ICD-10-CM | POA: Diagnosis not present

## 2018-09-20 DIAGNOSIS — R2689 Other abnormalities of gait and mobility: Secondary | ICD-10-CM | POA: Diagnosis not present

## 2018-09-20 DIAGNOSIS — M6281 Muscle weakness (generalized): Secondary | ICD-10-CM | POA: Diagnosis not present

## 2018-09-20 DIAGNOSIS — H3530 Unspecified macular degeneration: Secondary | ICD-10-CM | POA: Diagnosis not present

## 2018-09-20 DIAGNOSIS — R627 Adult failure to thrive: Secondary | ICD-10-CM | POA: Diagnosis not present

## 2018-09-20 DIAGNOSIS — R488 Other symbolic dysfunctions: Secondary | ICD-10-CM | POA: Diagnosis not present

## 2018-09-21 DIAGNOSIS — R627 Adult failure to thrive: Secondary | ICD-10-CM | POA: Diagnosis not present

## 2018-09-21 DIAGNOSIS — M6281 Muscle weakness (generalized): Secondary | ICD-10-CM | POA: Diagnosis not present

## 2018-09-21 DIAGNOSIS — M25562 Pain in left knee: Secondary | ICD-10-CM | POA: Diagnosis not present

## 2018-09-21 DIAGNOSIS — R488 Other symbolic dysfunctions: Secondary | ICD-10-CM | POA: Diagnosis not present

## 2018-09-21 DIAGNOSIS — F0280 Dementia in other diseases classified elsewhere without behavioral disturbance: Secondary | ICD-10-CM | POA: Diagnosis not present

## 2018-09-21 DIAGNOSIS — R2689 Other abnormalities of gait and mobility: Secondary | ICD-10-CM | POA: Diagnosis not present

## 2018-09-21 DIAGNOSIS — G301 Alzheimer's disease with late onset: Secondary | ICD-10-CM | POA: Diagnosis not present

## 2018-09-21 DIAGNOSIS — H3530 Unspecified macular degeneration: Secondary | ICD-10-CM | POA: Diagnosis not present

## 2018-09-22 DIAGNOSIS — M6281 Muscle weakness (generalized): Secondary | ICD-10-CM | POA: Diagnosis not present

## 2018-09-22 DIAGNOSIS — G301 Alzheimer's disease with late onset: Secondary | ICD-10-CM | POA: Diagnosis not present

## 2018-09-22 DIAGNOSIS — H3530 Unspecified macular degeneration: Secondary | ICD-10-CM | POA: Diagnosis not present

## 2018-09-22 DIAGNOSIS — R488 Other symbolic dysfunctions: Secondary | ICD-10-CM | POA: Diagnosis not present

## 2018-09-22 DIAGNOSIS — R2689 Other abnormalities of gait and mobility: Secondary | ICD-10-CM | POA: Diagnosis not present

## 2018-09-22 DIAGNOSIS — R627 Adult failure to thrive: Secondary | ICD-10-CM | POA: Diagnosis not present

## 2018-09-22 DIAGNOSIS — M25562 Pain in left knee: Secondary | ICD-10-CM | POA: Diagnosis not present

## 2018-09-22 DIAGNOSIS — F0280 Dementia in other diseases classified elsewhere without behavioral disturbance: Secondary | ICD-10-CM | POA: Diagnosis not present

## 2018-09-23 DIAGNOSIS — M25562 Pain in left knee: Secondary | ICD-10-CM | POA: Diagnosis not present

## 2018-09-23 DIAGNOSIS — H3530 Unspecified macular degeneration: Secondary | ICD-10-CM | POA: Diagnosis not present

## 2018-09-23 DIAGNOSIS — B342 Coronavirus infection, unspecified: Secondary | ICD-10-CM | POA: Diagnosis not present

## 2018-09-23 DIAGNOSIS — R488 Other symbolic dysfunctions: Secondary | ICD-10-CM | POA: Diagnosis not present

## 2018-09-23 DIAGNOSIS — M6281 Muscle weakness (generalized): Secondary | ICD-10-CM | POA: Diagnosis not present

## 2018-09-23 DIAGNOSIS — R627 Adult failure to thrive: Secondary | ICD-10-CM | POA: Diagnosis not present

## 2018-09-23 DIAGNOSIS — R2689 Other abnormalities of gait and mobility: Secondary | ICD-10-CM | POA: Diagnosis not present

## 2018-09-23 DIAGNOSIS — F0280 Dementia in other diseases classified elsewhere without behavioral disturbance: Secondary | ICD-10-CM | POA: Diagnosis not present

## 2018-09-23 DIAGNOSIS — G301 Alzheimer's disease with late onset: Secondary | ICD-10-CM | POA: Diagnosis not present

## 2018-09-24 DIAGNOSIS — R488 Other symbolic dysfunctions: Secondary | ICD-10-CM | POA: Diagnosis not present

## 2018-09-24 DIAGNOSIS — G301 Alzheimer's disease with late onset: Secondary | ICD-10-CM | POA: Diagnosis not present

## 2018-09-24 DIAGNOSIS — H3530 Unspecified macular degeneration: Secondary | ICD-10-CM | POA: Diagnosis not present

## 2018-09-24 DIAGNOSIS — M25562 Pain in left knee: Secondary | ICD-10-CM | POA: Diagnosis not present

## 2018-09-24 DIAGNOSIS — R2689 Other abnormalities of gait and mobility: Secondary | ICD-10-CM | POA: Diagnosis not present

## 2018-09-24 DIAGNOSIS — M6281 Muscle weakness (generalized): Secondary | ICD-10-CM | POA: Diagnosis not present

## 2018-09-24 DIAGNOSIS — R627 Adult failure to thrive: Secondary | ICD-10-CM | POA: Diagnosis not present

## 2018-09-24 DIAGNOSIS — F0280 Dementia in other diseases classified elsewhere without behavioral disturbance: Secondary | ICD-10-CM | POA: Diagnosis not present

## 2018-09-27 DIAGNOSIS — R627 Adult failure to thrive: Secondary | ICD-10-CM | POA: Diagnosis not present

## 2018-09-27 DIAGNOSIS — M25562 Pain in left knee: Secondary | ICD-10-CM | POA: Diagnosis not present

## 2018-09-27 DIAGNOSIS — R2689 Other abnormalities of gait and mobility: Secondary | ICD-10-CM | POA: Diagnosis not present

## 2018-09-27 DIAGNOSIS — H3530 Unspecified macular degeneration: Secondary | ICD-10-CM | POA: Diagnosis not present

## 2018-09-27 DIAGNOSIS — M6281 Muscle weakness (generalized): Secondary | ICD-10-CM | POA: Diagnosis not present

## 2018-09-27 DIAGNOSIS — G301 Alzheimer's disease with late onset: Secondary | ICD-10-CM | POA: Diagnosis not present

## 2018-09-27 DIAGNOSIS — F0280 Dementia in other diseases classified elsewhere without behavioral disturbance: Secondary | ICD-10-CM | POA: Diagnosis not present

## 2018-09-27 DIAGNOSIS — R488 Other symbolic dysfunctions: Secondary | ICD-10-CM | POA: Diagnosis not present

## 2018-09-28 DIAGNOSIS — R2689 Other abnormalities of gait and mobility: Secondary | ICD-10-CM | POA: Diagnosis not present

## 2018-09-28 DIAGNOSIS — G301 Alzheimer's disease with late onset: Secondary | ICD-10-CM | POA: Diagnosis not present

## 2018-09-28 DIAGNOSIS — H3530 Unspecified macular degeneration: Secondary | ICD-10-CM | POA: Diagnosis not present

## 2018-09-28 DIAGNOSIS — M6281 Muscle weakness (generalized): Secondary | ICD-10-CM | POA: Diagnosis not present

## 2018-09-28 DIAGNOSIS — R488 Other symbolic dysfunctions: Secondary | ICD-10-CM | POA: Diagnosis not present

## 2018-09-28 DIAGNOSIS — M25562 Pain in left knee: Secondary | ICD-10-CM | POA: Diagnosis not present

## 2018-09-28 DIAGNOSIS — R627 Adult failure to thrive: Secondary | ICD-10-CM | POA: Diagnosis not present

## 2018-09-28 DIAGNOSIS — F0280 Dementia in other diseases classified elsewhere without behavioral disturbance: Secondary | ICD-10-CM | POA: Diagnosis not present

## 2018-09-29 DIAGNOSIS — G301 Alzheimer's disease with late onset: Secondary | ICD-10-CM | POA: Diagnosis not present

## 2018-09-29 DIAGNOSIS — M6281 Muscle weakness (generalized): Secondary | ICD-10-CM | POA: Diagnosis not present

## 2018-09-29 DIAGNOSIS — R488 Other symbolic dysfunctions: Secondary | ICD-10-CM | POA: Diagnosis not present

## 2018-09-29 DIAGNOSIS — R2689 Other abnormalities of gait and mobility: Secondary | ICD-10-CM | POA: Diagnosis not present

## 2018-09-29 DIAGNOSIS — F0280 Dementia in other diseases classified elsewhere without behavioral disturbance: Secondary | ICD-10-CM | POA: Diagnosis not present

## 2018-09-29 DIAGNOSIS — H3530 Unspecified macular degeneration: Secondary | ICD-10-CM | POA: Diagnosis not present

## 2018-09-29 DIAGNOSIS — M25562 Pain in left knee: Secondary | ICD-10-CM | POA: Diagnosis not present

## 2018-09-29 DIAGNOSIS — R627 Adult failure to thrive: Secondary | ICD-10-CM | POA: Diagnosis not present

## 2018-09-30 DIAGNOSIS — R488 Other symbolic dysfunctions: Secondary | ICD-10-CM | POA: Diagnosis not present

## 2018-09-30 DIAGNOSIS — R2689 Other abnormalities of gait and mobility: Secondary | ICD-10-CM | POA: Diagnosis not present

## 2018-09-30 DIAGNOSIS — M6281 Muscle weakness (generalized): Secondary | ICD-10-CM | POA: Diagnosis not present

## 2018-09-30 DIAGNOSIS — F0280 Dementia in other diseases classified elsewhere without behavioral disturbance: Secondary | ICD-10-CM | POA: Diagnosis not present

## 2018-09-30 DIAGNOSIS — H3530 Unspecified macular degeneration: Secondary | ICD-10-CM | POA: Diagnosis not present

## 2018-09-30 DIAGNOSIS — M25562 Pain in left knee: Secondary | ICD-10-CM | POA: Diagnosis not present

## 2018-09-30 DIAGNOSIS — F5101 Primary insomnia: Secondary | ICD-10-CM | POA: Diagnosis not present

## 2018-09-30 DIAGNOSIS — F4323 Adjustment disorder with mixed anxiety and depressed mood: Secondary | ICD-10-CM | POA: Diagnosis not present

## 2018-09-30 DIAGNOSIS — R627 Adult failure to thrive: Secondary | ICD-10-CM | POA: Diagnosis not present

## 2018-09-30 DIAGNOSIS — G301 Alzheimer's disease with late onset: Secondary | ICD-10-CM | POA: Diagnosis not present

## 2018-10-01 DIAGNOSIS — R488 Other symbolic dysfunctions: Secondary | ICD-10-CM | POA: Diagnosis not present

## 2018-10-01 DIAGNOSIS — M25562 Pain in left knee: Secondary | ICD-10-CM | POA: Diagnosis not present

## 2018-10-01 DIAGNOSIS — H3530 Unspecified macular degeneration: Secondary | ICD-10-CM | POA: Diagnosis not present

## 2018-10-01 DIAGNOSIS — F0280 Dementia in other diseases classified elsewhere without behavioral disturbance: Secondary | ICD-10-CM | POA: Diagnosis not present

## 2018-10-01 DIAGNOSIS — R627 Adult failure to thrive: Secondary | ICD-10-CM | POA: Diagnosis not present

## 2018-10-01 DIAGNOSIS — M6281 Muscle weakness (generalized): Secondary | ICD-10-CM | POA: Diagnosis not present

## 2018-10-01 DIAGNOSIS — R2689 Other abnormalities of gait and mobility: Secondary | ICD-10-CM | POA: Diagnosis not present

## 2018-10-01 DIAGNOSIS — G301 Alzheimer's disease with late onset: Secondary | ICD-10-CM | POA: Diagnosis not present

## 2018-10-02 DIAGNOSIS — E782 Mixed hyperlipidemia: Secondary | ICD-10-CM | POA: Diagnosis not present

## 2018-10-02 DIAGNOSIS — I10 Essential (primary) hypertension: Secondary | ICD-10-CM | POA: Diagnosis not present

## 2018-10-02 DIAGNOSIS — D649 Anemia, unspecified: Secondary | ICD-10-CM | POA: Diagnosis not present

## 2018-10-02 DIAGNOSIS — E039 Hypothyroidism, unspecified: Secondary | ICD-10-CM | POA: Diagnosis not present

## 2018-10-04 DIAGNOSIS — R2689 Other abnormalities of gait and mobility: Secondary | ICD-10-CM | POA: Diagnosis not present

## 2018-10-04 DIAGNOSIS — M6281 Muscle weakness (generalized): Secondary | ICD-10-CM | POA: Diagnosis not present

## 2018-10-04 DIAGNOSIS — F0280 Dementia in other diseases classified elsewhere without behavioral disturbance: Secondary | ICD-10-CM | POA: Diagnosis not present

## 2018-10-04 DIAGNOSIS — R488 Other symbolic dysfunctions: Secondary | ICD-10-CM | POA: Diagnosis not present

## 2018-10-04 DIAGNOSIS — R627 Adult failure to thrive: Secondary | ICD-10-CM | POA: Diagnosis not present

## 2018-10-04 DIAGNOSIS — M25562 Pain in left knee: Secondary | ICD-10-CM | POA: Diagnosis not present

## 2018-10-04 DIAGNOSIS — H3530 Unspecified macular degeneration: Secondary | ICD-10-CM | POA: Diagnosis not present

## 2018-10-04 DIAGNOSIS — G301 Alzheimer's disease with late onset: Secondary | ICD-10-CM | POA: Diagnosis not present

## 2018-10-05 DIAGNOSIS — R2689 Other abnormalities of gait and mobility: Secondary | ICD-10-CM | POA: Diagnosis not present

## 2018-10-05 DIAGNOSIS — F0280 Dementia in other diseases classified elsewhere without behavioral disturbance: Secondary | ICD-10-CM | POA: Diagnosis not present

## 2018-10-05 DIAGNOSIS — G301 Alzheimer's disease with late onset: Secondary | ICD-10-CM | POA: Diagnosis not present

## 2018-10-05 DIAGNOSIS — H3530 Unspecified macular degeneration: Secondary | ICD-10-CM | POA: Diagnosis not present

## 2018-10-05 DIAGNOSIS — R488 Other symbolic dysfunctions: Secondary | ICD-10-CM | POA: Diagnosis not present

## 2018-10-05 DIAGNOSIS — M6281 Muscle weakness (generalized): Secondary | ICD-10-CM | POA: Diagnosis not present

## 2018-10-05 DIAGNOSIS — R627 Adult failure to thrive: Secondary | ICD-10-CM | POA: Diagnosis not present

## 2018-10-05 DIAGNOSIS — M25562 Pain in left knee: Secondary | ICD-10-CM | POA: Diagnosis not present

## 2018-10-06 DIAGNOSIS — M25562 Pain in left knee: Secondary | ICD-10-CM | POA: Diagnosis not present

## 2018-10-06 DIAGNOSIS — R2689 Other abnormalities of gait and mobility: Secondary | ICD-10-CM | POA: Diagnosis not present

## 2018-10-06 DIAGNOSIS — R627 Adult failure to thrive: Secondary | ICD-10-CM | POA: Diagnosis not present

## 2018-10-06 DIAGNOSIS — R488 Other symbolic dysfunctions: Secondary | ICD-10-CM | POA: Diagnosis not present

## 2018-10-06 DIAGNOSIS — F4323 Adjustment disorder with mixed anxiety and depressed mood: Secondary | ICD-10-CM | POA: Diagnosis not present

## 2018-10-06 DIAGNOSIS — H3530 Unspecified macular degeneration: Secondary | ICD-10-CM | POA: Diagnosis not present

## 2018-10-06 DIAGNOSIS — M6281 Muscle weakness (generalized): Secondary | ICD-10-CM | POA: Diagnosis not present

## 2018-10-06 DIAGNOSIS — F0280 Dementia in other diseases classified elsewhere without behavioral disturbance: Secondary | ICD-10-CM | POA: Diagnosis not present

## 2018-10-06 DIAGNOSIS — G301 Alzheimer's disease with late onset: Secondary | ICD-10-CM | POA: Diagnosis not present

## 2018-10-07 DIAGNOSIS — M25562 Pain in left knee: Secondary | ICD-10-CM | POA: Diagnosis not present

## 2018-10-07 DIAGNOSIS — H3530 Unspecified macular degeneration: Secondary | ICD-10-CM | POA: Diagnosis not present

## 2018-10-07 DIAGNOSIS — F5101 Primary insomnia: Secondary | ICD-10-CM | POA: Diagnosis not present

## 2018-10-07 DIAGNOSIS — E039 Hypothyroidism, unspecified: Secondary | ICD-10-CM | POA: Diagnosis not present

## 2018-10-07 DIAGNOSIS — R627 Adult failure to thrive: Secondary | ICD-10-CM | POA: Diagnosis not present

## 2018-10-07 DIAGNOSIS — F0280 Dementia in other diseases classified elsewhere without behavioral disturbance: Secondary | ICD-10-CM | POA: Diagnosis not present

## 2018-10-07 DIAGNOSIS — M6281 Muscle weakness (generalized): Secondary | ICD-10-CM | POA: Diagnosis not present

## 2018-10-07 DIAGNOSIS — G301 Alzheimer's disease with late onset: Secondary | ICD-10-CM | POA: Diagnosis not present

## 2018-10-07 DIAGNOSIS — D638 Anemia in other chronic diseases classified elsewhere: Secondary | ICD-10-CM | POA: Diagnosis not present

## 2018-10-07 DIAGNOSIS — R5381 Other malaise: Secondary | ICD-10-CM | POA: Diagnosis not present

## 2018-10-07 DIAGNOSIS — M8949 Other hypertrophic osteoarthropathy, multiple sites: Secondary | ICD-10-CM | POA: Diagnosis not present

## 2018-10-07 DIAGNOSIS — R2689 Other abnormalities of gait and mobility: Secondary | ICD-10-CM | POA: Diagnosis not present

## 2018-10-07 DIAGNOSIS — R488 Other symbolic dysfunctions: Secondary | ICD-10-CM | POA: Diagnosis not present

## 2018-10-08 DIAGNOSIS — M6281 Muscle weakness (generalized): Secondary | ICD-10-CM | POA: Diagnosis not present

## 2018-10-08 DIAGNOSIS — F0280 Dementia in other diseases classified elsewhere without behavioral disturbance: Secondary | ICD-10-CM | POA: Diagnosis not present

## 2018-10-08 DIAGNOSIS — R2689 Other abnormalities of gait and mobility: Secondary | ICD-10-CM | POA: Diagnosis not present

## 2018-10-08 DIAGNOSIS — R488 Other symbolic dysfunctions: Secondary | ICD-10-CM | POA: Diagnosis not present

## 2018-10-08 DIAGNOSIS — H3530 Unspecified macular degeneration: Secondary | ICD-10-CM | POA: Diagnosis not present

## 2018-10-08 DIAGNOSIS — G301 Alzheimer's disease with late onset: Secondary | ICD-10-CM | POA: Diagnosis not present

## 2018-10-08 DIAGNOSIS — R627 Adult failure to thrive: Secondary | ICD-10-CM | POA: Diagnosis not present

## 2018-10-08 DIAGNOSIS — M25562 Pain in left knee: Secondary | ICD-10-CM | POA: Diagnosis not present

## 2018-10-10 DIAGNOSIS — M6281 Muscle weakness (generalized): Secondary | ICD-10-CM | POA: Diagnosis not present

## 2018-10-10 DIAGNOSIS — M25562 Pain in left knee: Secondary | ICD-10-CM | POA: Diagnosis not present

## 2018-10-10 DIAGNOSIS — F0280 Dementia in other diseases classified elsewhere without behavioral disturbance: Secondary | ICD-10-CM | POA: Diagnosis not present

## 2018-10-10 DIAGNOSIS — H3530 Unspecified macular degeneration: Secondary | ICD-10-CM | POA: Diagnosis not present

## 2018-10-10 DIAGNOSIS — R2689 Other abnormalities of gait and mobility: Secondary | ICD-10-CM | POA: Diagnosis not present

## 2018-10-10 DIAGNOSIS — R627 Adult failure to thrive: Secondary | ICD-10-CM | POA: Diagnosis not present

## 2018-10-10 DIAGNOSIS — G301 Alzheimer's disease with late onset: Secondary | ICD-10-CM | POA: Diagnosis not present

## 2018-10-10 DIAGNOSIS — R488 Other symbolic dysfunctions: Secondary | ICD-10-CM | POA: Diagnosis not present

## 2018-10-11 DIAGNOSIS — R2689 Other abnormalities of gait and mobility: Secondary | ICD-10-CM | POA: Diagnosis not present

## 2018-10-11 DIAGNOSIS — H3530 Unspecified macular degeneration: Secondary | ICD-10-CM | POA: Diagnosis not present

## 2018-10-11 DIAGNOSIS — F0280 Dementia in other diseases classified elsewhere without behavioral disturbance: Secondary | ICD-10-CM | POA: Diagnosis not present

## 2018-10-11 DIAGNOSIS — R627 Adult failure to thrive: Secondary | ICD-10-CM | POA: Diagnosis not present

## 2018-10-11 DIAGNOSIS — G301 Alzheimer's disease with late onset: Secondary | ICD-10-CM | POA: Diagnosis not present

## 2018-10-11 DIAGNOSIS — M6281 Muscle weakness (generalized): Secondary | ICD-10-CM | POA: Diagnosis not present

## 2018-10-11 DIAGNOSIS — R488 Other symbolic dysfunctions: Secondary | ICD-10-CM | POA: Diagnosis not present

## 2018-10-11 DIAGNOSIS — M25562 Pain in left knee: Secondary | ICD-10-CM | POA: Diagnosis not present

## 2018-10-13 DIAGNOSIS — M6281 Muscle weakness (generalized): Secondary | ICD-10-CM | POA: Diagnosis not present

## 2018-10-13 DIAGNOSIS — F0280 Dementia in other diseases classified elsewhere without behavioral disturbance: Secondary | ICD-10-CM | POA: Diagnosis not present

## 2018-10-13 DIAGNOSIS — R2689 Other abnormalities of gait and mobility: Secondary | ICD-10-CM | POA: Diagnosis not present

## 2018-10-13 DIAGNOSIS — R627 Adult failure to thrive: Secondary | ICD-10-CM | POA: Diagnosis not present

## 2018-10-13 DIAGNOSIS — H3530 Unspecified macular degeneration: Secondary | ICD-10-CM | POA: Diagnosis not present

## 2018-10-13 DIAGNOSIS — R488 Other symbolic dysfunctions: Secondary | ICD-10-CM | POA: Diagnosis not present

## 2018-10-13 DIAGNOSIS — G301 Alzheimer's disease with late onset: Secondary | ICD-10-CM | POA: Diagnosis not present

## 2018-10-13 DIAGNOSIS — M25562 Pain in left knee: Secondary | ICD-10-CM | POA: Diagnosis not present

## 2018-10-14 DIAGNOSIS — F0280 Dementia in other diseases classified elsewhere without behavioral disturbance: Secondary | ICD-10-CM | POA: Diagnosis not present

## 2018-10-14 DIAGNOSIS — H3530 Unspecified macular degeneration: Secondary | ICD-10-CM | POA: Diagnosis not present

## 2018-10-14 DIAGNOSIS — G301 Alzheimer's disease with late onset: Secondary | ICD-10-CM | POA: Diagnosis not present

## 2018-10-14 DIAGNOSIS — R488 Other symbolic dysfunctions: Secondary | ICD-10-CM | POA: Diagnosis not present

## 2018-10-14 DIAGNOSIS — F4323 Adjustment disorder with mixed anxiety and depressed mood: Secondary | ICD-10-CM | POA: Diagnosis not present

## 2018-10-14 DIAGNOSIS — M25562 Pain in left knee: Secondary | ICD-10-CM | POA: Diagnosis not present

## 2018-10-14 DIAGNOSIS — R2689 Other abnormalities of gait and mobility: Secondary | ICD-10-CM | POA: Diagnosis not present

## 2018-10-14 DIAGNOSIS — M6281 Muscle weakness (generalized): Secondary | ICD-10-CM | POA: Diagnosis not present

## 2018-10-14 DIAGNOSIS — R627 Adult failure to thrive: Secondary | ICD-10-CM | POA: Diagnosis not present

## 2018-10-15 DIAGNOSIS — F0280 Dementia in other diseases classified elsewhere without behavioral disturbance: Secondary | ICD-10-CM | POA: Diagnosis not present

## 2018-10-15 DIAGNOSIS — M6281 Muscle weakness (generalized): Secondary | ICD-10-CM | POA: Diagnosis not present

## 2018-10-15 DIAGNOSIS — H3530 Unspecified macular degeneration: Secondary | ICD-10-CM | POA: Diagnosis not present

## 2018-10-15 DIAGNOSIS — M8949 Other hypertrophic osteoarthropathy, multiple sites: Secondary | ICD-10-CM | POA: Diagnosis not present

## 2018-10-15 DIAGNOSIS — R2689 Other abnormalities of gait and mobility: Secondary | ICD-10-CM | POA: Diagnosis not present

## 2018-10-15 DIAGNOSIS — D638 Anemia in other chronic diseases classified elsewhere: Secondary | ICD-10-CM | POA: Diagnosis not present

## 2018-10-15 DIAGNOSIS — E039 Hypothyroidism, unspecified: Secondary | ICD-10-CM | POA: Diagnosis not present

## 2018-10-15 DIAGNOSIS — R488 Other symbolic dysfunctions: Secondary | ICD-10-CM | POA: Diagnosis not present

## 2018-10-15 DIAGNOSIS — R5381 Other malaise: Secondary | ICD-10-CM | POA: Diagnosis not present

## 2018-10-15 DIAGNOSIS — M25562 Pain in left knee: Secondary | ICD-10-CM | POA: Diagnosis not present

## 2018-10-15 DIAGNOSIS — G301 Alzheimer's disease with late onset: Secondary | ICD-10-CM | POA: Diagnosis not present

## 2018-10-15 DIAGNOSIS — R627 Adult failure to thrive: Secondary | ICD-10-CM | POA: Diagnosis not present

## 2018-10-18 DIAGNOSIS — R627 Adult failure to thrive: Secondary | ICD-10-CM | POA: Diagnosis not present

## 2018-10-18 DIAGNOSIS — F0280 Dementia in other diseases classified elsewhere without behavioral disturbance: Secondary | ICD-10-CM | POA: Diagnosis not present

## 2018-10-18 DIAGNOSIS — M6281 Muscle weakness (generalized): Secondary | ICD-10-CM | POA: Diagnosis not present

## 2018-10-18 DIAGNOSIS — G301 Alzheimer's disease with late onset: Secondary | ICD-10-CM | POA: Diagnosis not present

## 2018-10-18 DIAGNOSIS — H3530 Unspecified macular degeneration: Secondary | ICD-10-CM | POA: Diagnosis not present

## 2018-10-18 DIAGNOSIS — R2689 Other abnormalities of gait and mobility: Secondary | ICD-10-CM | POA: Diagnosis not present

## 2018-10-18 DIAGNOSIS — R488 Other symbolic dysfunctions: Secondary | ICD-10-CM | POA: Diagnosis not present

## 2018-10-18 DIAGNOSIS — M25562 Pain in left knee: Secondary | ICD-10-CM | POA: Diagnosis not present

## 2018-10-19 DIAGNOSIS — M25562 Pain in left knee: Secondary | ICD-10-CM | POA: Diagnosis not present

## 2018-10-19 DIAGNOSIS — M6281 Muscle weakness (generalized): Secondary | ICD-10-CM | POA: Diagnosis not present

## 2018-10-19 DIAGNOSIS — F0280 Dementia in other diseases classified elsewhere without behavioral disturbance: Secondary | ICD-10-CM | POA: Diagnosis not present

## 2018-10-19 DIAGNOSIS — R627 Adult failure to thrive: Secondary | ICD-10-CM | POA: Diagnosis not present

## 2018-10-19 DIAGNOSIS — H3530 Unspecified macular degeneration: Secondary | ICD-10-CM | POA: Diagnosis not present

## 2018-10-19 DIAGNOSIS — G301 Alzheimer's disease with late onset: Secondary | ICD-10-CM | POA: Diagnosis not present

## 2018-10-19 DIAGNOSIS — R488 Other symbolic dysfunctions: Secondary | ICD-10-CM | POA: Diagnosis not present

## 2018-10-19 DIAGNOSIS — R2689 Other abnormalities of gait and mobility: Secondary | ICD-10-CM | POA: Diagnosis not present

## 2018-10-20 DIAGNOSIS — R627 Adult failure to thrive: Secondary | ICD-10-CM | POA: Diagnosis not present

## 2018-10-20 DIAGNOSIS — M25562 Pain in left knee: Secondary | ICD-10-CM | POA: Diagnosis not present

## 2018-10-20 DIAGNOSIS — R488 Other symbolic dysfunctions: Secondary | ICD-10-CM | POA: Diagnosis not present

## 2018-10-20 DIAGNOSIS — F0280 Dementia in other diseases classified elsewhere without behavioral disturbance: Secondary | ICD-10-CM | POA: Diagnosis not present

## 2018-10-20 DIAGNOSIS — M6281 Muscle weakness (generalized): Secondary | ICD-10-CM | POA: Diagnosis not present

## 2018-10-20 DIAGNOSIS — G301 Alzheimer's disease with late onset: Secondary | ICD-10-CM | POA: Diagnosis not present

## 2018-10-20 DIAGNOSIS — R2689 Other abnormalities of gait and mobility: Secondary | ICD-10-CM | POA: Diagnosis not present

## 2018-10-20 DIAGNOSIS — H3530 Unspecified macular degeneration: Secondary | ICD-10-CM | POA: Diagnosis not present

## 2018-10-21 DIAGNOSIS — G301 Alzheimer's disease with late onset: Secondary | ICD-10-CM | POA: Diagnosis not present

## 2018-10-21 DIAGNOSIS — F0280 Dementia in other diseases classified elsewhere without behavioral disturbance: Secondary | ICD-10-CM | POA: Diagnosis not present

## 2018-10-21 DIAGNOSIS — R627 Adult failure to thrive: Secondary | ICD-10-CM | POA: Diagnosis not present

## 2018-10-21 DIAGNOSIS — R2689 Other abnormalities of gait and mobility: Secondary | ICD-10-CM | POA: Diagnosis not present

## 2018-10-21 DIAGNOSIS — H3530 Unspecified macular degeneration: Secondary | ICD-10-CM | POA: Diagnosis not present

## 2018-10-21 DIAGNOSIS — R488 Other symbolic dysfunctions: Secondary | ICD-10-CM | POA: Diagnosis not present

## 2018-10-21 DIAGNOSIS — M6281 Muscle weakness (generalized): Secondary | ICD-10-CM | POA: Diagnosis not present

## 2018-10-21 DIAGNOSIS — M25562 Pain in left knee: Secondary | ICD-10-CM | POA: Diagnosis not present

## 2018-10-22 DIAGNOSIS — M25562 Pain in left knee: Secondary | ICD-10-CM | POA: Diagnosis not present

## 2018-10-22 DIAGNOSIS — F4323 Adjustment disorder with mixed anxiety and depressed mood: Secondary | ICD-10-CM | POA: Diagnosis not present

## 2018-10-22 DIAGNOSIS — M6281 Muscle weakness (generalized): Secondary | ICD-10-CM | POA: Diagnosis not present

## 2018-10-22 DIAGNOSIS — F0280 Dementia in other diseases classified elsewhere without behavioral disturbance: Secondary | ICD-10-CM | POA: Diagnosis not present

## 2018-10-22 DIAGNOSIS — G301 Alzheimer's disease with late onset: Secondary | ICD-10-CM | POA: Diagnosis not present

## 2018-10-22 DIAGNOSIS — R627 Adult failure to thrive: Secondary | ICD-10-CM | POA: Diagnosis not present

## 2018-10-22 DIAGNOSIS — R2689 Other abnormalities of gait and mobility: Secondary | ICD-10-CM | POA: Diagnosis not present

## 2018-10-22 DIAGNOSIS — H3530 Unspecified macular degeneration: Secondary | ICD-10-CM | POA: Diagnosis not present

## 2018-10-22 DIAGNOSIS — R488 Other symbolic dysfunctions: Secondary | ICD-10-CM | POA: Diagnosis not present

## 2018-10-25 DIAGNOSIS — M25562 Pain in left knee: Secondary | ICD-10-CM | POA: Diagnosis not present

## 2018-10-25 DIAGNOSIS — G301 Alzheimer's disease with late onset: Secondary | ICD-10-CM | POA: Diagnosis not present

## 2018-10-25 DIAGNOSIS — H3530 Unspecified macular degeneration: Secondary | ICD-10-CM | POA: Diagnosis not present

## 2018-10-25 DIAGNOSIS — M6281 Muscle weakness (generalized): Secondary | ICD-10-CM | POA: Diagnosis not present

## 2018-10-25 DIAGNOSIS — F0280 Dementia in other diseases classified elsewhere without behavioral disturbance: Secondary | ICD-10-CM | POA: Diagnosis not present

## 2018-10-25 DIAGNOSIS — R627 Adult failure to thrive: Secondary | ICD-10-CM | POA: Diagnosis not present

## 2018-10-25 DIAGNOSIS — R488 Other symbolic dysfunctions: Secondary | ICD-10-CM | POA: Diagnosis not present

## 2018-10-25 DIAGNOSIS — R2689 Other abnormalities of gait and mobility: Secondary | ICD-10-CM | POA: Diagnosis not present

## 2018-10-26 DIAGNOSIS — M6281 Muscle weakness (generalized): Secondary | ICD-10-CM | POA: Diagnosis not present

## 2018-10-26 DIAGNOSIS — R627 Adult failure to thrive: Secondary | ICD-10-CM | POA: Diagnosis not present

## 2018-10-26 DIAGNOSIS — M25562 Pain in left knee: Secondary | ICD-10-CM | POA: Diagnosis not present

## 2018-10-26 DIAGNOSIS — R2689 Other abnormalities of gait and mobility: Secondary | ICD-10-CM | POA: Diagnosis not present

## 2018-10-26 DIAGNOSIS — G301 Alzheimer's disease with late onset: Secondary | ICD-10-CM | POA: Diagnosis not present

## 2018-10-26 DIAGNOSIS — H3530 Unspecified macular degeneration: Secondary | ICD-10-CM | POA: Diagnosis not present

## 2018-10-27 DIAGNOSIS — H3530 Unspecified macular degeneration: Secondary | ICD-10-CM | POA: Diagnosis not present

## 2018-10-27 DIAGNOSIS — M25562 Pain in left knee: Secondary | ICD-10-CM | POA: Diagnosis not present

## 2018-10-27 DIAGNOSIS — G301 Alzheimer's disease with late onset: Secondary | ICD-10-CM | POA: Diagnosis not present

## 2018-10-27 DIAGNOSIS — R627 Adult failure to thrive: Secondary | ICD-10-CM | POA: Diagnosis not present

## 2018-10-27 DIAGNOSIS — R2689 Other abnormalities of gait and mobility: Secondary | ICD-10-CM | POA: Diagnosis not present

## 2018-10-27 DIAGNOSIS — M6281 Muscle weakness (generalized): Secondary | ICD-10-CM | POA: Diagnosis not present

## 2018-10-28 DIAGNOSIS — R627 Adult failure to thrive: Secondary | ICD-10-CM | POA: Diagnosis not present

## 2018-10-28 DIAGNOSIS — M6281 Muscle weakness (generalized): Secondary | ICD-10-CM | POA: Diagnosis not present

## 2018-10-28 DIAGNOSIS — G301 Alzheimer's disease with late onset: Secondary | ICD-10-CM | POA: Diagnosis not present

## 2018-10-28 DIAGNOSIS — M25562 Pain in left knee: Secondary | ICD-10-CM | POA: Diagnosis not present

## 2018-10-28 DIAGNOSIS — H3530 Unspecified macular degeneration: Secondary | ICD-10-CM | POA: Diagnosis not present

## 2018-10-28 DIAGNOSIS — F4323 Adjustment disorder with mixed anxiety and depressed mood: Secondary | ICD-10-CM | POA: Diagnosis not present

## 2018-10-28 DIAGNOSIS — R2689 Other abnormalities of gait and mobility: Secondary | ICD-10-CM | POA: Diagnosis not present

## 2018-10-29 DIAGNOSIS — G301 Alzheimer's disease with late onset: Secondary | ICD-10-CM | POA: Diagnosis not present

## 2018-10-29 DIAGNOSIS — F5101 Primary insomnia: Secondary | ICD-10-CM | POA: Diagnosis not present

## 2018-11-01 DIAGNOSIS — G301 Alzheimer's disease with late onset: Secondary | ICD-10-CM | POA: Diagnosis not present

## 2018-11-01 DIAGNOSIS — H3530 Unspecified macular degeneration: Secondary | ICD-10-CM | POA: Diagnosis not present

## 2018-11-01 DIAGNOSIS — M6281 Muscle weakness (generalized): Secondary | ICD-10-CM | POA: Diagnosis not present

## 2018-11-01 DIAGNOSIS — R627 Adult failure to thrive: Secondary | ICD-10-CM | POA: Diagnosis not present

## 2018-11-01 DIAGNOSIS — M25562 Pain in left knee: Secondary | ICD-10-CM | POA: Diagnosis not present

## 2018-11-01 DIAGNOSIS — R2689 Other abnormalities of gait and mobility: Secondary | ICD-10-CM | POA: Diagnosis not present

## 2018-11-02 DIAGNOSIS — M6281 Muscle weakness (generalized): Secondary | ICD-10-CM | POA: Diagnosis not present

## 2018-11-02 DIAGNOSIS — M25562 Pain in left knee: Secondary | ICD-10-CM | POA: Diagnosis not present

## 2018-11-02 DIAGNOSIS — R2689 Other abnormalities of gait and mobility: Secondary | ICD-10-CM | POA: Diagnosis not present

## 2018-11-02 DIAGNOSIS — G301 Alzheimer's disease with late onset: Secondary | ICD-10-CM | POA: Diagnosis not present

## 2018-11-02 DIAGNOSIS — R627 Adult failure to thrive: Secondary | ICD-10-CM | POA: Diagnosis not present

## 2018-11-02 DIAGNOSIS — H3530 Unspecified macular degeneration: Secondary | ICD-10-CM | POA: Diagnosis not present

## 2018-11-03 DIAGNOSIS — H3530 Unspecified macular degeneration: Secondary | ICD-10-CM | POA: Diagnosis not present

## 2018-11-03 DIAGNOSIS — G301 Alzheimer's disease with late onset: Secondary | ICD-10-CM | POA: Diagnosis not present

## 2018-11-03 DIAGNOSIS — M25562 Pain in left knee: Secondary | ICD-10-CM | POA: Diagnosis not present

## 2018-11-03 DIAGNOSIS — R627 Adult failure to thrive: Secondary | ICD-10-CM | POA: Diagnosis not present

## 2018-11-03 DIAGNOSIS — M6281 Muscle weakness (generalized): Secondary | ICD-10-CM | POA: Diagnosis not present

## 2018-11-03 DIAGNOSIS — F4323 Adjustment disorder with mixed anxiety and depressed mood: Secondary | ICD-10-CM | POA: Diagnosis not present

## 2018-11-03 DIAGNOSIS — R2689 Other abnormalities of gait and mobility: Secondary | ICD-10-CM | POA: Diagnosis not present

## 2018-11-04 DIAGNOSIS — E039 Hypothyroidism, unspecified: Secondary | ICD-10-CM | POA: Diagnosis not present

## 2018-11-04 DIAGNOSIS — G301 Alzheimer's disease with late onset: Secondary | ICD-10-CM | POA: Diagnosis not present

## 2018-11-04 DIAGNOSIS — M6281 Muscle weakness (generalized): Secondary | ICD-10-CM | POA: Diagnosis not present

## 2018-11-04 DIAGNOSIS — M8949 Other hypertrophic osteoarthropathy, multiple sites: Secondary | ICD-10-CM | POA: Diagnosis not present

## 2018-11-04 DIAGNOSIS — R5381 Other malaise: Secondary | ICD-10-CM | POA: Diagnosis not present

## 2018-11-04 DIAGNOSIS — H3530 Unspecified macular degeneration: Secondary | ICD-10-CM | POA: Diagnosis not present

## 2018-11-04 DIAGNOSIS — R627 Adult failure to thrive: Secondary | ICD-10-CM | POA: Diagnosis not present

## 2018-11-04 DIAGNOSIS — R2689 Other abnormalities of gait and mobility: Secondary | ICD-10-CM | POA: Diagnosis not present

## 2018-11-04 DIAGNOSIS — B356 Tinea cruris: Secondary | ICD-10-CM | POA: Diagnosis not present

## 2018-11-04 DIAGNOSIS — M25562 Pain in left knee: Secondary | ICD-10-CM | POA: Diagnosis not present

## 2018-11-05 DIAGNOSIS — R627 Adult failure to thrive: Secondary | ICD-10-CM | POA: Diagnosis not present

## 2018-11-05 DIAGNOSIS — G301 Alzheimer's disease with late onset: Secondary | ICD-10-CM | POA: Diagnosis not present

## 2018-11-05 DIAGNOSIS — H3530 Unspecified macular degeneration: Secondary | ICD-10-CM | POA: Diagnosis not present

## 2018-11-05 DIAGNOSIS — M6281 Muscle weakness (generalized): Secondary | ICD-10-CM | POA: Diagnosis not present

## 2018-11-05 DIAGNOSIS — R2689 Other abnormalities of gait and mobility: Secondary | ICD-10-CM | POA: Diagnosis not present

## 2018-11-05 DIAGNOSIS — M25562 Pain in left knee: Secondary | ICD-10-CM | POA: Diagnosis not present

## 2018-11-08 DIAGNOSIS — M6281 Muscle weakness (generalized): Secondary | ICD-10-CM | POA: Diagnosis not present

## 2018-11-08 DIAGNOSIS — G301 Alzheimer's disease with late onset: Secondary | ICD-10-CM | POA: Diagnosis not present

## 2018-11-08 DIAGNOSIS — H3530 Unspecified macular degeneration: Secondary | ICD-10-CM | POA: Diagnosis not present

## 2018-11-08 DIAGNOSIS — M25562 Pain in left knee: Secondary | ICD-10-CM | POA: Diagnosis not present

## 2018-11-08 DIAGNOSIS — R627 Adult failure to thrive: Secondary | ICD-10-CM | POA: Diagnosis not present

## 2018-11-08 DIAGNOSIS — R2689 Other abnormalities of gait and mobility: Secondary | ICD-10-CM | POA: Diagnosis not present

## 2018-11-09 DIAGNOSIS — M25562 Pain in left knee: Secondary | ICD-10-CM | POA: Diagnosis not present

## 2018-11-09 DIAGNOSIS — E039 Hypothyroidism, unspecified: Secondary | ICD-10-CM | POA: Diagnosis not present

## 2018-11-09 DIAGNOSIS — R2689 Other abnormalities of gait and mobility: Secondary | ICD-10-CM | POA: Diagnosis not present

## 2018-11-09 DIAGNOSIS — R627 Adult failure to thrive: Secondary | ICD-10-CM | POA: Diagnosis not present

## 2018-11-09 DIAGNOSIS — M79676 Pain in unspecified toe(s): Secondary | ICD-10-CM | POA: Diagnosis not present

## 2018-11-09 DIAGNOSIS — M6281 Muscle weakness (generalized): Secondary | ICD-10-CM | POA: Diagnosis not present

## 2018-11-09 DIAGNOSIS — M8949 Other hypertrophic osteoarthropathy, multiple sites: Secondary | ICD-10-CM | POA: Diagnosis not present

## 2018-11-09 DIAGNOSIS — R5381 Other malaise: Secondary | ICD-10-CM | POA: Diagnosis not present

## 2018-11-09 DIAGNOSIS — G301 Alzheimer's disease with late onset: Secondary | ICD-10-CM | POA: Diagnosis not present

## 2018-11-09 DIAGNOSIS — H3530 Unspecified macular degeneration: Secondary | ICD-10-CM | POA: Diagnosis not present

## 2018-11-10 DIAGNOSIS — M25562 Pain in left knee: Secondary | ICD-10-CM | POA: Diagnosis not present

## 2018-11-10 DIAGNOSIS — G301 Alzheimer's disease with late onset: Secondary | ICD-10-CM | POA: Diagnosis not present

## 2018-11-10 DIAGNOSIS — M6281 Muscle weakness (generalized): Secondary | ICD-10-CM | POA: Diagnosis not present

## 2018-11-10 DIAGNOSIS — H3530 Unspecified macular degeneration: Secondary | ICD-10-CM | POA: Diagnosis not present

## 2018-11-10 DIAGNOSIS — R2689 Other abnormalities of gait and mobility: Secondary | ICD-10-CM | POA: Diagnosis not present

## 2018-11-10 DIAGNOSIS — R627 Adult failure to thrive: Secondary | ICD-10-CM | POA: Diagnosis not present

## 2018-11-11 DIAGNOSIS — M25562 Pain in left knee: Secondary | ICD-10-CM | POA: Diagnosis not present

## 2018-11-11 DIAGNOSIS — R2689 Other abnormalities of gait and mobility: Secondary | ICD-10-CM | POA: Diagnosis not present

## 2018-11-11 DIAGNOSIS — G301 Alzheimer's disease with late onset: Secondary | ICD-10-CM | POA: Diagnosis not present

## 2018-11-11 DIAGNOSIS — R627 Adult failure to thrive: Secondary | ICD-10-CM | POA: Diagnosis not present

## 2018-11-11 DIAGNOSIS — H3530 Unspecified macular degeneration: Secondary | ICD-10-CM | POA: Diagnosis not present

## 2018-11-11 DIAGNOSIS — F4323 Adjustment disorder with mixed anxiety and depressed mood: Secondary | ICD-10-CM | POA: Diagnosis not present

## 2018-11-11 DIAGNOSIS — M6281 Muscle weakness (generalized): Secondary | ICD-10-CM | POA: Diagnosis not present

## 2018-11-12 DIAGNOSIS — R627 Adult failure to thrive: Secondary | ICD-10-CM | POA: Diagnosis not present

## 2018-11-12 DIAGNOSIS — M25562 Pain in left knee: Secondary | ICD-10-CM | POA: Diagnosis not present

## 2018-11-12 DIAGNOSIS — R2689 Other abnormalities of gait and mobility: Secondary | ICD-10-CM | POA: Diagnosis not present

## 2018-11-12 DIAGNOSIS — G301 Alzheimer's disease with late onset: Secondary | ICD-10-CM | POA: Diagnosis not present

## 2018-11-12 DIAGNOSIS — H3530 Unspecified macular degeneration: Secondary | ICD-10-CM | POA: Diagnosis not present

## 2018-11-12 DIAGNOSIS — M6281 Muscle weakness (generalized): Secondary | ICD-10-CM | POA: Diagnosis not present

## 2018-11-15 DIAGNOSIS — R2689 Other abnormalities of gait and mobility: Secondary | ICD-10-CM | POA: Diagnosis not present

## 2018-11-15 DIAGNOSIS — R627 Adult failure to thrive: Secondary | ICD-10-CM | POA: Diagnosis not present

## 2018-11-15 DIAGNOSIS — M25562 Pain in left knee: Secondary | ICD-10-CM | POA: Diagnosis not present

## 2018-11-15 DIAGNOSIS — Z20828 Contact with and (suspected) exposure to other viral communicable diseases: Secondary | ICD-10-CM | POA: Diagnosis not present

## 2018-11-15 DIAGNOSIS — G301 Alzheimer's disease with late onset: Secondary | ICD-10-CM | POA: Diagnosis not present

## 2018-11-15 DIAGNOSIS — M6281 Muscle weakness (generalized): Secondary | ICD-10-CM | POA: Diagnosis not present

## 2018-11-15 DIAGNOSIS — H3530 Unspecified macular degeneration: Secondary | ICD-10-CM | POA: Diagnosis not present

## 2018-11-16 DIAGNOSIS — R2689 Other abnormalities of gait and mobility: Secondary | ICD-10-CM | POA: Diagnosis not present

## 2018-11-16 DIAGNOSIS — H3530 Unspecified macular degeneration: Secondary | ICD-10-CM | POA: Diagnosis not present

## 2018-11-16 DIAGNOSIS — R627 Adult failure to thrive: Secondary | ICD-10-CM | POA: Diagnosis not present

## 2018-11-16 DIAGNOSIS — M25562 Pain in left knee: Secondary | ICD-10-CM | POA: Diagnosis not present

## 2018-11-16 DIAGNOSIS — G301 Alzheimer's disease with late onset: Secondary | ICD-10-CM | POA: Diagnosis not present

## 2018-11-16 DIAGNOSIS — F4323 Adjustment disorder with mixed anxiety and depressed mood: Secondary | ICD-10-CM | POA: Diagnosis not present

## 2018-11-16 DIAGNOSIS — M6281 Muscle weakness (generalized): Secondary | ICD-10-CM | POA: Diagnosis not present

## 2018-11-17 DIAGNOSIS — M6281 Muscle weakness (generalized): Secondary | ICD-10-CM | POA: Diagnosis not present

## 2018-11-17 DIAGNOSIS — M25562 Pain in left knee: Secondary | ICD-10-CM | POA: Diagnosis not present

## 2018-11-17 DIAGNOSIS — R627 Adult failure to thrive: Secondary | ICD-10-CM | POA: Diagnosis not present

## 2018-11-17 DIAGNOSIS — G301 Alzheimer's disease with late onset: Secondary | ICD-10-CM | POA: Diagnosis not present

## 2018-11-17 DIAGNOSIS — R2689 Other abnormalities of gait and mobility: Secondary | ICD-10-CM | POA: Diagnosis not present

## 2018-11-17 DIAGNOSIS — H3530 Unspecified macular degeneration: Secondary | ICD-10-CM | POA: Diagnosis not present

## 2018-11-18 DIAGNOSIS — M25562 Pain in left knee: Secondary | ICD-10-CM | POA: Diagnosis not present

## 2018-11-18 DIAGNOSIS — H3530 Unspecified macular degeneration: Secondary | ICD-10-CM | POA: Diagnosis not present

## 2018-11-18 DIAGNOSIS — R627 Adult failure to thrive: Secondary | ICD-10-CM | POA: Diagnosis not present

## 2018-11-18 DIAGNOSIS — R2689 Other abnormalities of gait and mobility: Secondary | ICD-10-CM | POA: Diagnosis not present

## 2018-11-18 DIAGNOSIS — M6281 Muscle weakness (generalized): Secondary | ICD-10-CM | POA: Diagnosis not present

## 2018-11-18 DIAGNOSIS — G301 Alzheimer's disease with late onset: Secondary | ICD-10-CM | POA: Diagnosis not present

## 2018-11-19 DIAGNOSIS — M6281 Muscle weakness (generalized): Secondary | ICD-10-CM | POA: Diagnosis not present

## 2018-11-19 DIAGNOSIS — G301 Alzheimer's disease with late onset: Secondary | ICD-10-CM | POA: Diagnosis not present

## 2018-11-19 DIAGNOSIS — R2689 Other abnormalities of gait and mobility: Secondary | ICD-10-CM | POA: Diagnosis not present

## 2018-11-19 DIAGNOSIS — M25562 Pain in left knee: Secondary | ICD-10-CM | POA: Diagnosis not present

## 2018-11-19 DIAGNOSIS — R627 Adult failure to thrive: Secondary | ICD-10-CM | POA: Diagnosis not present

## 2018-11-19 DIAGNOSIS — H3530 Unspecified macular degeneration: Secondary | ICD-10-CM | POA: Diagnosis not present

## 2018-11-22 DIAGNOSIS — M25562 Pain in left knee: Secondary | ICD-10-CM | POA: Diagnosis not present

## 2018-11-22 DIAGNOSIS — R2689 Other abnormalities of gait and mobility: Secondary | ICD-10-CM | POA: Diagnosis not present

## 2018-11-22 DIAGNOSIS — R627 Adult failure to thrive: Secondary | ICD-10-CM | POA: Diagnosis not present

## 2018-11-22 DIAGNOSIS — M6281 Muscle weakness (generalized): Secondary | ICD-10-CM | POA: Diagnosis not present

## 2018-11-22 DIAGNOSIS — G301 Alzheimer's disease with late onset: Secondary | ICD-10-CM | POA: Diagnosis not present

## 2018-11-22 DIAGNOSIS — H3530 Unspecified macular degeneration: Secondary | ICD-10-CM | POA: Diagnosis not present

## 2018-11-24 DIAGNOSIS — H3530 Unspecified macular degeneration: Secondary | ICD-10-CM | POA: Diagnosis not present

## 2018-11-24 DIAGNOSIS — R2689 Other abnormalities of gait and mobility: Secondary | ICD-10-CM | POA: Diagnosis not present

## 2018-11-24 DIAGNOSIS — M6281 Muscle weakness (generalized): Secondary | ICD-10-CM | POA: Diagnosis not present

## 2018-11-24 DIAGNOSIS — M25562 Pain in left knee: Secondary | ICD-10-CM | POA: Diagnosis not present

## 2018-11-24 DIAGNOSIS — G301 Alzheimer's disease with late onset: Secondary | ICD-10-CM | POA: Diagnosis not present

## 2018-11-24 DIAGNOSIS — R627 Adult failure to thrive: Secondary | ICD-10-CM | POA: Diagnosis not present

## 2018-11-25 DIAGNOSIS — M6281 Muscle weakness (generalized): Secondary | ICD-10-CM | POA: Diagnosis not present

## 2018-11-25 DIAGNOSIS — R627 Adult failure to thrive: Secondary | ICD-10-CM | POA: Diagnosis not present

## 2018-11-25 DIAGNOSIS — R2689 Other abnormalities of gait and mobility: Secondary | ICD-10-CM | POA: Diagnosis not present

## 2018-11-25 DIAGNOSIS — M25562 Pain in left knee: Secondary | ICD-10-CM | POA: Diagnosis not present

## 2018-11-25 DIAGNOSIS — H3530 Unspecified macular degeneration: Secondary | ICD-10-CM | POA: Diagnosis not present

## 2018-11-25 DIAGNOSIS — Z23 Encounter for immunization: Secondary | ICD-10-CM | POA: Diagnosis not present

## 2018-11-25 DIAGNOSIS — G301 Alzheimer's disease with late onset: Secondary | ICD-10-CM | POA: Diagnosis not present

## 2018-11-26 DIAGNOSIS — M25562 Pain in left knee: Secondary | ICD-10-CM | POA: Diagnosis not present

## 2018-11-26 DIAGNOSIS — G301 Alzheimer's disease with late onset: Secondary | ICD-10-CM | POA: Diagnosis not present

## 2018-11-26 DIAGNOSIS — R627 Adult failure to thrive: Secondary | ICD-10-CM | POA: Diagnosis not present

## 2018-11-26 DIAGNOSIS — M6281 Muscle weakness (generalized): Secondary | ICD-10-CM | POA: Diagnosis not present

## 2018-11-26 DIAGNOSIS — H3530 Unspecified macular degeneration: Secondary | ICD-10-CM | POA: Diagnosis not present

## 2018-11-26 DIAGNOSIS — Z23 Encounter for immunization: Secondary | ICD-10-CM | POA: Diagnosis not present

## 2018-11-26 DIAGNOSIS — R2689 Other abnormalities of gait and mobility: Secondary | ICD-10-CM | POA: Diagnosis not present

## 2018-11-27 DIAGNOSIS — M6281 Muscle weakness (generalized): Secondary | ICD-10-CM | POA: Diagnosis not present

## 2018-11-27 DIAGNOSIS — R627 Adult failure to thrive: Secondary | ICD-10-CM | POA: Diagnosis not present

## 2018-11-27 DIAGNOSIS — M25562 Pain in left knee: Secondary | ICD-10-CM | POA: Diagnosis not present

## 2018-11-27 DIAGNOSIS — G301 Alzheimer's disease with late onset: Secondary | ICD-10-CM | POA: Diagnosis not present

## 2018-11-27 DIAGNOSIS — R2689 Other abnormalities of gait and mobility: Secondary | ICD-10-CM | POA: Diagnosis not present

## 2018-11-27 DIAGNOSIS — Z23 Encounter for immunization: Secondary | ICD-10-CM | POA: Diagnosis not present

## 2018-11-27 DIAGNOSIS — H3530 Unspecified macular degeneration: Secondary | ICD-10-CM | POA: Diagnosis not present

## 2018-11-28 DIAGNOSIS — R2689 Other abnormalities of gait and mobility: Secondary | ICD-10-CM | POA: Diagnosis not present

## 2018-11-28 DIAGNOSIS — Z23 Encounter for immunization: Secondary | ICD-10-CM | POA: Diagnosis not present

## 2018-11-28 DIAGNOSIS — M25562 Pain in left knee: Secondary | ICD-10-CM | POA: Diagnosis not present

## 2018-11-28 DIAGNOSIS — M6281 Muscle weakness (generalized): Secondary | ICD-10-CM | POA: Diagnosis not present

## 2018-11-28 DIAGNOSIS — H3530 Unspecified macular degeneration: Secondary | ICD-10-CM | POA: Diagnosis not present

## 2018-11-28 DIAGNOSIS — G301 Alzheimer's disease with late onset: Secondary | ICD-10-CM | POA: Diagnosis not present

## 2018-11-28 DIAGNOSIS — R627 Adult failure to thrive: Secondary | ICD-10-CM | POA: Diagnosis not present

## 2018-11-29 DIAGNOSIS — R627 Adult failure to thrive: Secondary | ICD-10-CM | POA: Diagnosis not present

## 2018-11-29 DIAGNOSIS — H3530 Unspecified macular degeneration: Secondary | ICD-10-CM | POA: Diagnosis not present

## 2018-11-29 DIAGNOSIS — G301 Alzheimer's disease with late onset: Secondary | ICD-10-CM | POA: Diagnosis not present

## 2018-11-29 DIAGNOSIS — Z23 Encounter for immunization: Secondary | ICD-10-CM | POA: Diagnosis not present

## 2018-11-29 DIAGNOSIS — R2689 Other abnormalities of gait and mobility: Secondary | ICD-10-CM | POA: Diagnosis not present

## 2018-11-29 DIAGNOSIS — M6281 Muscle weakness (generalized): Secondary | ICD-10-CM | POA: Diagnosis not present

## 2018-11-29 DIAGNOSIS — M25562 Pain in left knee: Secondary | ICD-10-CM | POA: Diagnosis not present

## 2018-11-30 DIAGNOSIS — M6281 Muscle weakness (generalized): Secondary | ICD-10-CM | POA: Diagnosis not present

## 2018-11-30 DIAGNOSIS — H3530 Unspecified macular degeneration: Secondary | ICD-10-CM | POA: Diagnosis not present

## 2018-11-30 DIAGNOSIS — R627 Adult failure to thrive: Secondary | ICD-10-CM | POA: Diagnosis not present

## 2018-11-30 DIAGNOSIS — G301 Alzheimer's disease with late onset: Secondary | ICD-10-CM | POA: Diagnosis not present

## 2018-11-30 DIAGNOSIS — M25562 Pain in left knee: Secondary | ICD-10-CM | POA: Diagnosis not present

## 2018-11-30 DIAGNOSIS — R2689 Other abnormalities of gait and mobility: Secondary | ICD-10-CM | POA: Diagnosis not present

## 2018-11-30 DIAGNOSIS — Z23 Encounter for immunization: Secondary | ICD-10-CM | POA: Diagnosis not present

## 2018-12-01 DIAGNOSIS — Z23 Encounter for immunization: Secondary | ICD-10-CM | POA: Diagnosis not present

## 2018-12-01 DIAGNOSIS — H3530 Unspecified macular degeneration: Secondary | ICD-10-CM | POA: Diagnosis not present

## 2018-12-01 DIAGNOSIS — M25562 Pain in left knee: Secondary | ICD-10-CM | POA: Diagnosis not present

## 2018-12-01 DIAGNOSIS — R627 Adult failure to thrive: Secondary | ICD-10-CM | POA: Diagnosis not present

## 2018-12-01 DIAGNOSIS — M6281 Muscle weakness (generalized): Secondary | ICD-10-CM | POA: Diagnosis not present

## 2018-12-01 DIAGNOSIS — R2689 Other abnormalities of gait and mobility: Secondary | ICD-10-CM | POA: Diagnosis not present

## 2018-12-01 DIAGNOSIS — G301 Alzheimer's disease with late onset: Secondary | ICD-10-CM | POA: Diagnosis not present

## 2018-12-02 DIAGNOSIS — M8949 Other hypertrophic osteoarthropathy, multiple sites: Secondary | ICD-10-CM | POA: Diagnosis not present

## 2018-12-02 DIAGNOSIS — M25562 Pain in left knee: Secondary | ICD-10-CM | POA: Diagnosis not present

## 2018-12-02 DIAGNOSIS — H353133 Nonexudative age-related macular degeneration, bilateral, advanced atrophic without subfoveal involvement: Secondary | ICD-10-CM | POA: Diagnosis not present

## 2018-12-02 DIAGNOSIS — R2689 Other abnormalities of gait and mobility: Secondary | ICD-10-CM | POA: Diagnosis not present

## 2018-12-02 DIAGNOSIS — R627 Adult failure to thrive: Secondary | ICD-10-CM | POA: Diagnosis not present

## 2018-12-02 DIAGNOSIS — D638 Anemia in other chronic diseases classified elsewhere: Secondary | ICD-10-CM | POA: Diagnosis not present

## 2018-12-02 DIAGNOSIS — H04123 Dry eye syndrome of bilateral lacrimal glands: Secondary | ICD-10-CM | POA: Diagnosis not present

## 2018-12-02 DIAGNOSIS — E039 Hypothyroidism, unspecified: Secondary | ICD-10-CM | POA: Diagnosis not present

## 2018-12-02 DIAGNOSIS — M6281 Muscle weakness (generalized): Secondary | ICD-10-CM | POA: Diagnosis not present

## 2018-12-02 DIAGNOSIS — Z961 Presence of intraocular lens: Secondary | ICD-10-CM | POA: Diagnosis not present

## 2018-12-02 DIAGNOSIS — M79676 Pain in unspecified toe(s): Secondary | ICD-10-CM | POA: Diagnosis not present

## 2018-12-02 DIAGNOSIS — G301 Alzheimer's disease with late onset: Secondary | ICD-10-CM | POA: Diagnosis not present

## 2018-12-02 DIAGNOSIS — H3530 Unspecified macular degeneration: Secondary | ICD-10-CM | POA: Diagnosis not present

## 2018-12-02 DIAGNOSIS — Z23 Encounter for immunization: Secondary | ICD-10-CM | POA: Diagnosis not present

## 2018-12-03 DIAGNOSIS — Z23 Encounter for immunization: Secondary | ICD-10-CM | POA: Diagnosis not present

## 2018-12-03 DIAGNOSIS — G301 Alzheimer's disease with late onset: Secondary | ICD-10-CM | POA: Diagnosis not present

## 2018-12-03 DIAGNOSIS — R2689 Other abnormalities of gait and mobility: Secondary | ICD-10-CM | POA: Diagnosis not present

## 2018-12-03 DIAGNOSIS — R627 Adult failure to thrive: Secondary | ICD-10-CM | POA: Diagnosis not present

## 2018-12-03 DIAGNOSIS — M25562 Pain in left knee: Secondary | ICD-10-CM | POA: Diagnosis not present

## 2018-12-03 DIAGNOSIS — H3530 Unspecified macular degeneration: Secondary | ICD-10-CM | POA: Diagnosis not present

## 2018-12-03 DIAGNOSIS — M6281 Muscle weakness (generalized): Secondary | ICD-10-CM | POA: Diagnosis not present

## 2018-12-06 DIAGNOSIS — Z23 Encounter for immunization: Secondary | ICD-10-CM | POA: Diagnosis not present

## 2018-12-06 DIAGNOSIS — R627 Adult failure to thrive: Secondary | ICD-10-CM | POA: Diagnosis not present

## 2018-12-06 DIAGNOSIS — G301 Alzheimer's disease with late onset: Secondary | ICD-10-CM | POA: Diagnosis not present

## 2018-12-06 DIAGNOSIS — M25562 Pain in left knee: Secondary | ICD-10-CM | POA: Diagnosis not present

## 2018-12-06 DIAGNOSIS — H3530 Unspecified macular degeneration: Secondary | ICD-10-CM | POA: Diagnosis not present

## 2018-12-06 DIAGNOSIS — R2689 Other abnormalities of gait and mobility: Secondary | ICD-10-CM | POA: Diagnosis not present

## 2018-12-06 DIAGNOSIS — M6281 Muscle weakness (generalized): Secondary | ICD-10-CM | POA: Diagnosis not present

## 2018-12-07 DIAGNOSIS — H3530 Unspecified macular degeneration: Secondary | ICD-10-CM | POA: Diagnosis not present

## 2018-12-07 DIAGNOSIS — M25562 Pain in left knee: Secondary | ICD-10-CM | POA: Diagnosis not present

## 2018-12-07 DIAGNOSIS — M6281 Muscle weakness (generalized): Secondary | ICD-10-CM | POA: Diagnosis not present

## 2018-12-07 DIAGNOSIS — R627 Adult failure to thrive: Secondary | ICD-10-CM | POA: Diagnosis not present

## 2018-12-07 DIAGNOSIS — R2689 Other abnormalities of gait and mobility: Secondary | ICD-10-CM | POA: Diagnosis not present

## 2018-12-07 DIAGNOSIS — G301 Alzheimer's disease with late onset: Secondary | ICD-10-CM | POA: Diagnosis not present

## 2018-12-07 DIAGNOSIS — Z23 Encounter for immunization: Secondary | ICD-10-CM | POA: Diagnosis not present

## 2018-12-07 DIAGNOSIS — F4323 Adjustment disorder with mixed anxiety and depressed mood: Secondary | ICD-10-CM | POA: Diagnosis not present

## 2018-12-08 DIAGNOSIS — R2689 Other abnormalities of gait and mobility: Secondary | ICD-10-CM | POA: Diagnosis not present

## 2018-12-08 DIAGNOSIS — D638 Anemia in other chronic diseases classified elsewhere: Secondary | ICD-10-CM | POA: Diagnosis not present

## 2018-12-08 DIAGNOSIS — G301 Alzheimer's disease with late onset: Secondary | ICD-10-CM | POA: Diagnosis not present

## 2018-12-08 DIAGNOSIS — H3530 Unspecified macular degeneration: Secondary | ICD-10-CM | POA: Diagnosis not present

## 2018-12-08 DIAGNOSIS — R5381 Other malaise: Secondary | ICD-10-CM | POA: Diagnosis not present

## 2018-12-08 DIAGNOSIS — M8949 Other hypertrophic osteoarthropathy, multiple sites: Secondary | ICD-10-CM | POA: Diagnosis not present

## 2018-12-08 DIAGNOSIS — M79676 Pain in unspecified toe(s): Secondary | ICD-10-CM | POA: Diagnosis not present

## 2018-12-08 DIAGNOSIS — M25562 Pain in left knee: Secondary | ICD-10-CM | POA: Diagnosis not present

## 2018-12-08 DIAGNOSIS — Z23 Encounter for immunization: Secondary | ICD-10-CM | POA: Diagnosis not present

## 2018-12-08 DIAGNOSIS — R627 Adult failure to thrive: Secondary | ICD-10-CM | POA: Diagnosis not present

## 2018-12-08 DIAGNOSIS — M6281 Muscle weakness (generalized): Secondary | ICD-10-CM | POA: Diagnosis not present

## 2018-12-09 DIAGNOSIS — M25562 Pain in left knee: Secondary | ICD-10-CM | POA: Diagnosis not present

## 2018-12-09 DIAGNOSIS — M6281 Muscle weakness (generalized): Secondary | ICD-10-CM | POA: Diagnosis not present

## 2018-12-09 DIAGNOSIS — R2689 Other abnormalities of gait and mobility: Secondary | ICD-10-CM | POA: Diagnosis not present

## 2018-12-09 DIAGNOSIS — H3530 Unspecified macular degeneration: Secondary | ICD-10-CM | POA: Diagnosis not present

## 2018-12-09 DIAGNOSIS — G301 Alzheimer's disease with late onset: Secondary | ICD-10-CM | POA: Diagnosis not present

## 2018-12-09 DIAGNOSIS — G4701 Insomnia due to medical condition: Secondary | ICD-10-CM | POA: Diagnosis not present

## 2018-12-09 DIAGNOSIS — R627 Adult failure to thrive: Secondary | ICD-10-CM | POA: Diagnosis not present

## 2018-12-09 DIAGNOSIS — F0280 Dementia in other diseases classified elsewhere without behavioral disturbance: Secondary | ICD-10-CM | POA: Diagnosis not present

## 2018-12-09 DIAGNOSIS — Z23 Encounter for immunization: Secondary | ICD-10-CM | POA: Diagnosis not present

## 2018-12-13 DIAGNOSIS — F4323 Adjustment disorder with mixed anxiety and depressed mood: Secondary | ICD-10-CM | POA: Diagnosis not present

## 2018-12-14 DIAGNOSIS — G301 Alzheimer's disease with late onset: Secondary | ICD-10-CM | POA: Diagnosis not present

## 2018-12-14 DIAGNOSIS — F0280 Dementia in other diseases classified elsewhere without behavioral disturbance: Secondary | ICD-10-CM | POA: Diagnosis not present

## 2018-12-14 DIAGNOSIS — G4701 Insomnia due to medical condition: Secondary | ICD-10-CM | POA: Diagnosis not present

## 2018-12-20 DIAGNOSIS — Z20828 Contact with and (suspected) exposure to other viral communicable diseases: Secondary | ICD-10-CM | POA: Diagnosis not present

## 2018-12-22 DIAGNOSIS — F4323 Adjustment disorder with mixed anxiety and depressed mood: Secondary | ICD-10-CM | POA: Diagnosis not present

## 2018-12-27 DIAGNOSIS — Z20828 Contact with and (suspected) exposure to other viral communicable diseases: Secondary | ICD-10-CM | POA: Diagnosis not present

## 2018-12-27 DIAGNOSIS — F4323 Adjustment disorder with mixed anxiety and depressed mood: Secondary | ICD-10-CM | POA: Diagnosis not present

## 2019-01-03 DIAGNOSIS — Z20828 Contact with and (suspected) exposure to other viral communicable diseases: Secondary | ICD-10-CM | POA: Diagnosis not present

## 2019-01-03 DIAGNOSIS — F4323 Adjustment disorder with mixed anxiety and depressed mood: Secondary | ICD-10-CM | POA: Diagnosis not present

## 2019-01-06 DIAGNOSIS — D638 Anemia in other chronic diseases classified elsewhere: Secondary | ICD-10-CM | POA: Diagnosis not present

## 2019-01-06 DIAGNOSIS — R5381 Other malaise: Secondary | ICD-10-CM | POA: Diagnosis not present

## 2019-01-06 DIAGNOSIS — M8949 Other hypertrophic osteoarthropathy, multiple sites: Secondary | ICD-10-CM | POA: Diagnosis not present

## 2019-01-06 DIAGNOSIS — U071 COVID-19: Secondary | ICD-10-CM | POA: Diagnosis not present

## 2019-01-10 DIAGNOSIS — G301 Alzheimer's disease with late onset: Secondary | ICD-10-CM | POA: Diagnosis not present

## 2019-01-10 DIAGNOSIS — M8949 Other hypertrophic osteoarthropathy, multiple sites: Secondary | ICD-10-CM | POA: Diagnosis not present

## 2019-01-10 DIAGNOSIS — M6281 Muscle weakness (generalized): Secondary | ICD-10-CM | POA: Diagnosis not present

## 2019-01-10 DIAGNOSIS — M25562 Pain in left knee: Secondary | ICD-10-CM | POA: Diagnosis not present

## 2019-01-10 DIAGNOSIS — R2689 Other abnormalities of gait and mobility: Secondary | ICD-10-CM | POA: Diagnosis not present

## 2019-01-10 DIAGNOSIS — U071 COVID-19: Secondary | ICD-10-CM | POA: Diagnosis not present

## 2019-01-11 DIAGNOSIS — G301 Alzheimer's disease with late onset: Secondary | ICD-10-CM | POA: Diagnosis not present

## 2019-01-11 DIAGNOSIS — M25562 Pain in left knee: Secondary | ICD-10-CM | POA: Diagnosis not present

## 2019-01-11 DIAGNOSIS — R2689 Other abnormalities of gait and mobility: Secondary | ICD-10-CM | POA: Diagnosis not present

## 2019-01-11 DIAGNOSIS — M8949 Other hypertrophic osteoarthropathy, multiple sites: Secondary | ICD-10-CM | POA: Diagnosis not present

## 2019-01-11 DIAGNOSIS — M6281 Muscle weakness (generalized): Secondary | ICD-10-CM | POA: Diagnosis not present

## 2019-01-11 DIAGNOSIS — U071 COVID-19: Secondary | ICD-10-CM | POA: Diagnosis not present

## 2019-01-12 DIAGNOSIS — U071 COVID-19: Secondary | ICD-10-CM | POA: Diagnosis not present

## 2019-01-12 DIAGNOSIS — M8949 Other hypertrophic osteoarthropathy, multiple sites: Secondary | ICD-10-CM | POA: Diagnosis not present

## 2019-01-12 DIAGNOSIS — M25562 Pain in left knee: Secondary | ICD-10-CM | POA: Diagnosis not present

## 2019-01-12 DIAGNOSIS — R2689 Other abnormalities of gait and mobility: Secondary | ICD-10-CM | POA: Diagnosis not present

## 2019-01-12 DIAGNOSIS — M6281 Muscle weakness (generalized): Secondary | ICD-10-CM | POA: Diagnosis not present

## 2019-01-12 DIAGNOSIS — G301 Alzheimer's disease with late onset: Secondary | ICD-10-CM | POA: Diagnosis not present

## 2019-01-13 DIAGNOSIS — M25562 Pain in left knee: Secondary | ICD-10-CM | POA: Diagnosis not present

## 2019-01-13 DIAGNOSIS — M6281 Muscle weakness (generalized): Secondary | ICD-10-CM | POA: Diagnosis not present

## 2019-01-13 DIAGNOSIS — R2689 Other abnormalities of gait and mobility: Secondary | ICD-10-CM | POA: Diagnosis not present

## 2019-01-13 DIAGNOSIS — G301 Alzheimer's disease with late onset: Secondary | ICD-10-CM | POA: Diagnosis not present

## 2019-01-13 DIAGNOSIS — M8949 Other hypertrophic osteoarthropathy, multiple sites: Secondary | ICD-10-CM | POA: Diagnosis not present

## 2019-01-13 DIAGNOSIS — U071 COVID-19: Secondary | ICD-10-CM | POA: Diagnosis not present

## 2019-01-14 DIAGNOSIS — M8949 Other hypertrophic osteoarthropathy, multiple sites: Secondary | ICD-10-CM | POA: Diagnosis not present

## 2019-01-14 DIAGNOSIS — R2689 Other abnormalities of gait and mobility: Secondary | ICD-10-CM | POA: Diagnosis not present

## 2019-01-14 DIAGNOSIS — M6281 Muscle weakness (generalized): Secondary | ICD-10-CM | POA: Diagnosis not present

## 2019-01-14 DIAGNOSIS — M25562 Pain in left knee: Secondary | ICD-10-CM | POA: Diagnosis not present

## 2019-01-14 DIAGNOSIS — G301 Alzheimer's disease with late onset: Secondary | ICD-10-CM | POA: Diagnosis not present

## 2019-01-14 DIAGNOSIS — U071 COVID-19: Secondary | ICD-10-CM | POA: Diagnosis not present

## 2019-01-15 DIAGNOSIS — F4323 Adjustment disorder with mixed anxiety and depressed mood: Secondary | ICD-10-CM | POA: Diagnosis not present

## 2019-01-16 DIAGNOSIS — F4323 Adjustment disorder with mixed anxiety and depressed mood: Secondary | ICD-10-CM | POA: Diagnosis not present

## 2019-01-17 DIAGNOSIS — U071 COVID-19: Secondary | ICD-10-CM | POA: Diagnosis not present

## 2019-01-17 DIAGNOSIS — M8949 Other hypertrophic osteoarthropathy, multiple sites: Secondary | ICD-10-CM | POA: Diagnosis not present

## 2019-01-17 DIAGNOSIS — R2689 Other abnormalities of gait and mobility: Secondary | ICD-10-CM | POA: Diagnosis not present

## 2019-01-17 DIAGNOSIS — M25562 Pain in left knee: Secondary | ICD-10-CM | POA: Diagnosis not present

## 2019-01-17 DIAGNOSIS — M6281 Muscle weakness (generalized): Secondary | ICD-10-CM | POA: Diagnosis not present

## 2019-01-17 DIAGNOSIS — G301 Alzheimer's disease with late onset: Secondary | ICD-10-CM | POA: Diagnosis not present

## 2019-01-18 DIAGNOSIS — M6281 Muscle weakness (generalized): Secondary | ICD-10-CM | POA: Diagnosis not present

## 2019-01-18 DIAGNOSIS — M25562 Pain in left knee: Secondary | ICD-10-CM | POA: Diagnosis not present

## 2019-01-18 DIAGNOSIS — G301 Alzheimer's disease with late onset: Secondary | ICD-10-CM | POA: Diagnosis not present

## 2019-01-18 DIAGNOSIS — R2689 Other abnormalities of gait and mobility: Secondary | ICD-10-CM | POA: Diagnosis not present

## 2019-01-18 DIAGNOSIS — U071 COVID-19: Secondary | ICD-10-CM | POA: Diagnosis not present

## 2019-01-18 DIAGNOSIS — M8949 Other hypertrophic osteoarthropathy, multiple sites: Secondary | ICD-10-CM | POA: Diagnosis not present

## 2019-01-19 DIAGNOSIS — M8949 Other hypertrophic osteoarthropathy, multiple sites: Secondary | ICD-10-CM | POA: Diagnosis not present

## 2019-01-19 DIAGNOSIS — U071 COVID-19: Secondary | ICD-10-CM | POA: Diagnosis not present

## 2019-01-19 DIAGNOSIS — M25562 Pain in left knee: Secondary | ICD-10-CM | POA: Diagnosis not present

## 2019-01-19 DIAGNOSIS — M6281 Muscle weakness (generalized): Secondary | ICD-10-CM | POA: Diagnosis not present

## 2019-01-19 DIAGNOSIS — R2689 Other abnormalities of gait and mobility: Secondary | ICD-10-CM | POA: Diagnosis not present

## 2019-01-19 DIAGNOSIS — G301 Alzheimer's disease with late onset: Secondary | ICD-10-CM | POA: Diagnosis not present

## 2019-01-20 DIAGNOSIS — G301 Alzheimer's disease with late onset: Secondary | ICD-10-CM | POA: Diagnosis not present

## 2019-01-20 DIAGNOSIS — R2689 Other abnormalities of gait and mobility: Secondary | ICD-10-CM | POA: Diagnosis not present

## 2019-01-20 DIAGNOSIS — M25562 Pain in left knee: Secondary | ICD-10-CM | POA: Diagnosis not present

## 2019-01-20 DIAGNOSIS — M8949 Other hypertrophic osteoarthropathy, multiple sites: Secondary | ICD-10-CM | POA: Diagnosis not present

## 2019-01-20 DIAGNOSIS — U071 COVID-19: Secondary | ICD-10-CM | POA: Diagnosis not present

## 2019-01-20 DIAGNOSIS — M6281 Muscle weakness (generalized): Secondary | ICD-10-CM | POA: Diagnosis not present

## 2019-01-21 DIAGNOSIS — G301 Alzheimer's disease with late onset: Secondary | ICD-10-CM | POA: Diagnosis not present

## 2019-01-21 DIAGNOSIS — U071 COVID-19: Secondary | ICD-10-CM | POA: Diagnosis not present

## 2019-01-21 DIAGNOSIS — M8949 Other hypertrophic osteoarthropathy, multiple sites: Secondary | ICD-10-CM | POA: Diagnosis not present

## 2019-01-21 DIAGNOSIS — R2689 Other abnormalities of gait and mobility: Secondary | ICD-10-CM | POA: Diagnosis not present

## 2019-01-21 DIAGNOSIS — M25562 Pain in left knee: Secondary | ICD-10-CM | POA: Diagnosis not present

## 2019-01-21 DIAGNOSIS — M6281 Muscle weakness (generalized): Secondary | ICD-10-CM | POA: Diagnosis not present

## 2019-01-23 DIAGNOSIS — U071 COVID-19: Secondary | ICD-10-CM | POA: Diagnosis not present

## 2019-01-23 DIAGNOSIS — M8949 Other hypertrophic osteoarthropathy, multiple sites: Secondary | ICD-10-CM | POA: Diagnosis not present

## 2019-01-23 DIAGNOSIS — M25562 Pain in left knee: Secondary | ICD-10-CM | POA: Diagnosis not present

## 2019-01-23 DIAGNOSIS — G301 Alzheimer's disease with late onset: Secondary | ICD-10-CM | POA: Diagnosis not present

## 2019-01-23 DIAGNOSIS — M6281 Muscle weakness (generalized): Secondary | ICD-10-CM | POA: Diagnosis not present

## 2019-01-23 DIAGNOSIS — R2689 Other abnormalities of gait and mobility: Secondary | ICD-10-CM | POA: Diagnosis not present

## 2019-01-24 DIAGNOSIS — M8949 Other hypertrophic osteoarthropathy, multiple sites: Secondary | ICD-10-CM | POA: Diagnosis not present

## 2019-01-24 DIAGNOSIS — M6281 Muscle weakness (generalized): Secondary | ICD-10-CM | POA: Diagnosis not present

## 2019-01-24 DIAGNOSIS — G301 Alzheimer's disease with late onset: Secondary | ICD-10-CM | POA: Diagnosis not present

## 2019-01-24 DIAGNOSIS — R2689 Other abnormalities of gait and mobility: Secondary | ICD-10-CM | POA: Diagnosis not present

## 2019-01-24 DIAGNOSIS — M25562 Pain in left knee: Secondary | ICD-10-CM | POA: Diagnosis not present

## 2019-01-24 DIAGNOSIS — U071 COVID-19: Secondary | ICD-10-CM | POA: Diagnosis not present

## 2019-01-25 DIAGNOSIS — M6281 Muscle weakness (generalized): Secondary | ICD-10-CM | POA: Diagnosis not present

## 2019-01-25 DIAGNOSIS — F4323 Adjustment disorder with mixed anxiety and depressed mood: Secondary | ICD-10-CM | POA: Diagnosis not present

## 2019-01-25 DIAGNOSIS — R2689 Other abnormalities of gait and mobility: Secondary | ICD-10-CM | POA: Diagnosis not present

## 2019-01-25 DIAGNOSIS — M25562 Pain in left knee: Secondary | ICD-10-CM | POA: Diagnosis not present

## 2019-01-25 DIAGNOSIS — M8949 Other hypertrophic osteoarthropathy, multiple sites: Secondary | ICD-10-CM | POA: Diagnosis not present

## 2019-01-25 DIAGNOSIS — G301 Alzheimer's disease with late onset: Secondary | ICD-10-CM | POA: Diagnosis not present

## 2019-01-25 DIAGNOSIS — U071 COVID-19: Secondary | ICD-10-CM | POA: Diagnosis not present

## 2019-01-26 DIAGNOSIS — M6281 Muscle weakness (generalized): Secondary | ICD-10-CM | POA: Diagnosis not present

## 2019-01-26 DIAGNOSIS — M25562 Pain in left knee: Secondary | ICD-10-CM | POA: Diagnosis not present

## 2019-01-26 DIAGNOSIS — G301 Alzheimer's disease with late onset: Secondary | ICD-10-CM | POA: Diagnosis not present

## 2019-01-26 DIAGNOSIS — R2689 Other abnormalities of gait and mobility: Secondary | ICD-10-CM | POA: Diagnosis not present

## 2019-01-26 DIAGNOSIS — M8949 Other hypertrophic osteoarthropathy, multiple sites: Secondary | ICD-10-CM | POA: Diagnosis not present

## 2019-01-26 DIAGNOSIS — U071 COVID-19: Secondary | ICD-10-CM | POA: Diagnosis not present

## 2019-01-27 DIAGNOSIS — M25562 Pain in left knee: Secondary | ICD-10-CM | POA: Diagnosis not present

## 2019-01-27 DIAGNOSIS — G301 Alzheimer's disease with late onset: Secondary | ICD-10-CM | POA: Diagnosis not present

## 2019-01-27 DIAGNOSIS — M6281 Muscle weakness (generalized): Secondary | ICD-10-CM | POA: Diagnosis not present

## 2019-01-27 DIAGNOSIS — M8949 Other hypertrophic osteoarthropathy, multiple sites: Secondary | ICD-10-CM | POA: Diagnosis not present

## 2019-01-27 DIAGNOSIS — F0280 Dementia in other diseases classified elsewhere without behavioral disturbance: Secondary | ICD-10-CM | POA: Diagnosis not present

## 2019-01-27 DIAGNOSIS — R2689 Other abnormalities of gait and mobility: Secondary | ICD-10-CM | POA: Diagnosis not present

## 2019-01-27 DIAGNOSIS — U071 COVID-19: Secondary | ICD-10-CM | POA: Diagnosis not present

## 2019-01-27 DIAGNOSIS — G4701 Insomnia due to medical condition: Secondary | ICD-10-CM | POA: Diagnosis not present

## 2019-01-28 DIAGNOSIS — U071 COVID-19: Secondary | ICD-10-CM | POA: Diagnosis not present

## 2019-01-28 DIAGNOSIS — M6281 Muscle weakness (generalized): Secondary | ICD-10-CM | POA: Diagnosis not present

## 2019-01-28 DIAGNOSIS — M25562 Pain in left knee: Secondary | ICD-10-CM | POA: Diagnosis not present

## 2019-01-28 DIAGNOSIS — G301 Alzheimer's disease with late onset: Secondary | ICD-10-CM | POA: Diagnosis not present

## 2019-01-28 DIAGNOSIS — M8949 Other hypertrophic osteoarthropathy, multiple sites: Secondary | ICD-10-CM | POA: Diagnosis not present

## 2019-01-28 DIAGNOSIS — R2689 Other abnormalities of gait and mobility: Secondary | ICD-10-CM | POA: Diagnosis not present

## 2019-01-30 DIAGNOSIS — U071 COVID-19: Secondary | ICD-10-CM | POA: Diagnosis not present

## 2019-01-30 DIAGNOSIS — G301 Alzheimer's disease with late onset: Secondary | ICD-10-CM | POA: Diagnosis not present

## 2019-01-30 DIAGNOSIS — M25562 Pain in left knee: Secondary | ICD-10-CM | POA: Diagnosis not present

## 2019-01-30 DIAGNOSIS — R2689 Other abnormalities of gait and mobility: Secondary | ICD-10-CM | POA: Diagnosis not present

## 2019-01-30 DIAGNOSIS — M6281 Muscle weakness (generalized): Secondary | ICD-10-CM | POA: Diagnosis not present

## 2019-01-30 DIAGNOSIS — M8949 Other hypertrophic osteoarthropathy, multiple sites: Secondary | ICD-10-CM | POA: Diagnosis not present

## 2019-01-31 DIAGNOSIS — G301 Alzheimer's disease with late onset: Secondary | ICD-10-CM | POA: Diagnosis not present

## 2019-01-31 DIAGNOSIS — M25562 Pain in left knee: Secondary | ICD-10-CM | POA: Diagnosis not present

## 2019-01-31 DIAGNOSIS — R2689 Other abnormalities of gait and mobility: Secondary | ICD-10-CM | POA: Diagnosis not present

## 2019-01-31 DIAGNOSIS — M8949 Other hypertrophic osteoarthropathy, multiple sites: Secondary | ICD-10-CM | POA: Diagnosis not present

## 2019-01-31 DIAGNOSIS — M6281 Muscle weakness (generalized): Secondary | ICD-10-CM | POA: Diagnosis not present

## 2019-01-31 DIAGNOSIS — U071 COVID-19: Secondary | ICD-10-CM | POA: Diagnosis not present

## 2019-02-01 DIAGNOSIS — M25562 Pain in left knee: Secondary | ICD-10-CM | POA: Diagnosis not present

## 2019-02-01 DIAGNOSIS — M8949 Other hypertrophic osteoarthropathy, multiple sites: Secondary | ICD-10-CM | POA: Diagnosis not present

## 2019-02-01 DIAGNOSIS — U071 COVID-19: Secondary | ICD-10-CM | POA: Diagnosis not present

## 2019-02-01 DIAGNOSIS — M6281 Muscle weakness (generalized): Secondary | ICD-10-CM | POA: Diagnosis not present

## 2019-02-01 DIAGNOSIS — G301 Alzheimer's disease with late onset: Secondary | ICD-10-CM | POA: Diagnosis not present

## 2019-02-01 DIAGNOSIS — R2689 Other abnormalities of gait and mobility: Secondary | ICD-10-CM | POA: Diagnosis not present

## 2019-02-02 DIAGNOSIS — M6281 Muscle weakness (generalized): Secondary | ICD-10-CM | POA: Diagnosis not present

## 2019-02-02 DIAGNOSIS — U071 COVID-19: Secondary | ICD-10-CM | POA: Diagnosis not present

## 2019-02-02 DIAGNOSIS — M25562 Pain in left knee: Secondary | ICD-10-CM | POA: Diagnosis not present

## 2019-02-02 DIAGNOSIS — G301 Alzheimer's disease with late onset: Secondary | ICD-10-CM | POA: Diagnosis not present

## 2019-02-02 DIAGNOSIS — R2689 Other abnormalities of gait and mobility: Secondary | ICD-10-CM | POA: Diagnosis not present

## 2019-02-02 DIAGNOSIS — M8949 Other hypertrophic osteoarthropathy, multiple sites: Secondary | ICD-10-CM | POA: Diagnosis not present

## 2019-02-03 DIAGNOSIS — M8949 Other hypertrophic osteoarthropathy, multiple sites: Secondary | ICD-10-CM | POA: Diagnosis not present

## 2019-02-03 DIAGNOSIS — U071 COVID-19: Secondary | ICD-10-CM | POA: Diagnosis not present

## 2019-02-03 DIAGNOSIS — G301 Alzheimer's disease with late onset: Secondary | ICD-10-CM | POA: Diagnosis not present

## 2019-02-03 DIAGNOSIS — M25562 Pain in left knee: Secondary | ICD-10-CM | POA: Diagnosis not present

## 2019-02-03 DIAGNOSIS — M6281 Muscle weakness (generalized): Secondary | ICD-10-CM | POA: Diagnosis not present

## 2019-02-03 DIAGNOSIS — R2689 Other abnormalities of gait and mobility: Secondary | ICD-10-CM | POA: Diagnosis not present

## 2019-02-04 DIAGNOSIS — G301 Alzheimer's disease with late onset: Secondary | ICD-10-CM | POA: Diagnosis not present

## 2019-02-04 DIAGNOSIS — R2689 Other abnormalities of gait and mobility: Secondary | ICD-10-CM | POA: Diagnosis not present

## 2019-02-04 DIAGNOSIS — U071 COVID-19: Secondary | ICD-10-CM | POA: Diagnosis not present

## 2019-02-04 DIAGNOSIS — M25562 Pain in left knee: Secondary | ICD-10-CM | POA: Diagnosis not present

## 2019-02-04 DIAGNOSIS — M6281 Muscle weakness (generalized): Secondary | ICD-10-CM | POA: Diagnosis not present

## 2019-02-04 DIAGNOSIS — M8949 Other hypertrophic osteoarthropathy, multiple sites: Secondary | ICD-10-CM | POA: Diagnosis not present

## 2019-02-07 DIAGNOSIS — M8949 Other hypertrophic osteoarthropathy, multiple sites: Secondary | ICD-10-CM | POA: Diagnosis not present

## 2019-02-07 DIAGNOSIS — U071 COVID-19: Secondary | ICD-10-CM | POA: Diagnosis not present

## 2019-02-07 DIAGNOSIS — G301 Alzheimer's disease with late onset: Secondary | ICD-10-CM | POA: Diagnosis not present

## 2019-02-07 DIAGNOSIS — M25562 Pain in left knee: Secondary | ICD-10-CM | POA: Diagnosis not present

## 2019-02-07 DIAGNOSIS — M6281 Muscle weakness (generalized): Secondary | ICD-10-CM | POA: Diagnosis not present

## 2019-02-07 DIAGNOSIS — R2689 Other abnormalities of gait and mobility: Secondary | ICD-10-CM | POA: Diagnosis not present

## 2019-02-08 DIAGNOSIS — G301 Alzheimer's disease with late onset: Secondary | ICD-10-CM | POA: Diagnosis not present

## 2019-02-08 DIAGNOSIS — M6281 Muscle weakness (generalized): Secondary | ICD-10-CM | POA: Diagnosis not present

## 2019-02-08 DIAGNOSIS — M8949 Other hypertrophic osteoarthropathy, multiple sites: Secondary | ICD-10-CM | POA: Diagnosis not present

## 2019-02-08 DIAGNOSIS — M25562 Pain in left knee: Secondary | ICD-10-CM | POA: Diagnosis not present

## 2019-02-08 DIAGNOSIS — R2689 Other abnormalities of gait and mobility: Secondary | ICD-10-CM | POA: Diagnosis not present

## 2019-02-08 DIAGNOSIS — U071 COVID-19: Secondary | ICD-10-CM | POA: Diagnosis not present

## 2019-02-09 DIAGNOSIS — M8949 Other hypertrophic osteoarthropathy, multiple sites: Secondary | ICD-10-CM | POA: Diagnosis not present

## 2019-02-09 DIAGNOSIS — U071 COVID-19: Secondary | ICD-10-CM | POA: Diagnosis not present

## 2019-02-09 DIAGNOSIS — R2689 Other abnormalities of gait and mobility: Secondary | ICD-10-CM | POA: Diagnosis not present

## 2019-02-09 DIAGNOSIS — G301 Alzheimer's disease with late onset: Secondary | ICD-10-CM | POA: Diagnosis not present

## 2019-02-09 DIAGNOSIS — M6281 Muscle weakness (generalized): Secondary | ICD-10-CM | POA: Diagnosis not present

## 2019-02-09 DIAGNOSIS — M25562 Pain in left knee: Secondary | ICD-10-CM | POA: Diagnosis not present

## 2019-02-10 DIAGNOSIS — U071 COVID-19: Secondary | ICD-10-CM | POA: Diagnosis not present

## 2019-02-10 DIAGNOSIS — G301 Alzheimer's disease with late onset: Secondary | ICD-10-CM | POA: Diagnosis not present

## 2019-02-10 DIAGNOSIS — I10 Essential (primary) hypertension: Secondary | ICD-10-CM | POA: Diagnosis not present

## 2019-02-10 DIAGNOSIS — E039 Hypothyroidism, unspecified: Secondary | ICD-10-CM | POA: Diagnosis not present

## 2019-02-10 DIAGNOSIS — R5381 Other malaise: Secondary | ICD-10-CM | POA: Diagnosis not present

## 2019-02-10 DIAGNOSIS — M8949 Other hypertrophic osteoarthropathy, multiple sites: Secondary | ICD-10-CM | POA: Diagnosis not present

## 2019-02-10 DIAGNOSIS — M6281 Muscle weakness (generalized): Secondary | ICD-10-CM | POA: Diagnosis not present

## 2019-02-10 DIAGNOSIS — R2689 Other abnormalities of gait and mobility: Secondary | ICD-10-CM | POA: Diagnosis not present

## 2019-02-10 DIAGNOSIS — M25562 Pain in left knee: Secondary | ICD-10-CM | POA: Diagnosis not present

## 2019-02-11 DIAGNOSIS — R2689 Other abnormalities of gait and mobility: Secondary | ICD-10-CM | POA: Diagnosis not present

## 2019-02-11 DIAGNOSIS — M8949 Other hypertrophic osteoarthropathy, multiple sites: Secondary | ICD-10-CM | POA: Diagnosis not present

## 2019-02-11 DIAGNOSIS — G301 Alzheimer's disease with late onset: Secondary | ICD-10-CM | POA: Diagnosis not present

## 2019-02-11 DIAGNOSIS — U071 COVID-19: Secondary | ICD-10-CM | POA: Diagnosis not present

## 2019-02-11 DIAGNOSIS — M6281 Muscle weakness (generalized): Secondary | ICD-10-CM | POA: Diagnosis not present

## 2019-02-11 DIAGNOSIS — M25562 Pain in left knee: Secondary | ICD-10-CM | POA: Diagnosis not present

## 2019-02-14 DIAGNOSIS — U071 COVID-19: Secondary | ICD-10-CM | POA: Diagnosis not present

## 2019-02-14 DIAGNOSIS — G301 Alzheimer's disease with late onset: Secondary | ICD-10-CM | POA: Diagnosis not present

## 2019-02-14 DIAGNOSIS — M6281 Muscle weakness (generalized): Secondary | ICD-10-CM | POA: Diagnosis not present

## 2019-02-14 DIAGNOSIS — M8949 Other hypertrophic osteoarthropathy, multiple sites: Secondary | ICD-10-CM | POA: Diagnosis not present

## 2019-02-14 DIAGNOSIS — M25562 Pain in left knee: Secondary | ICD-10-CM | POA: Diagnosis not present

## 2019-02-14 DIAGNOSIS — R2689 Other abnormalities of gait and mobility: Secondary | ICD-10-CM | POA: Diagnosis not present

## 2019-02-15 DIAGNOSIS — U071 COVID-19: Secondary | ICD-10-CM | POA: Diagnosis not present

## 2019-02-15 DIAGNOSIS — M8949 Other hypertrophic osteoarthropathy, multiple sites: Secondary | ICD-10-CM | POA: Diagnosis not present

## 2019-02-15 DIAGNOSIS — G301 Alzheimer's disease with late onset: Secondary | ICD-10-CM | POA: Diagnosis not present

## 2019-02-15 DIAGNOSIS — M25562 Pain in left knee: Secondary | ICD-10-CM | POA: Diagnosis not present

## 2019-02-15 DIAGNOSIS — R2689 Other abnormalities of gait and mobility: Secondary | ICD-10-CM | POA: Diagnosis not present

## 2019-02-15 DIAGNOSIS — M6281 Muscle weakness (generalized): Secondary | ICD-10-CM | POA: Diagnosis not present

## 2019-02-19 DIAGNOSIS — U071 COVID-19: Secondary | ICD-10-CM | POA: Diagnosis not present

## 2019-02-19 DIAGNOSIS — M8949 Other hypertrophic osteoarthropathy, multiple sites: Secondary | ICD-10-CM | POA: Diagnosis not present

## 2019-02-19 DIAGNOSIS — M6281 Muscle weakness (generalized): Secondary | ICD-10-CM | POA: Diagnosis not present

## 2019-02-19 DIAGNOSIS — M25562 Pain in left knee: Secondary | ICD-10-CM | POA: Diagnosis not present

## 2019-02-19 DIAGNOSIS — R2689 Other abnormalities of gait and mobility: Secondary | ICD-10-CM | POA: Diagnosis not present

## 2019-02-19 DIAGNOSIS — G301 Alzheimer's disease with late onset: Secondary | ICD-10-CM | POA: Diagnosis not present

## 2019-02-21 DIAGNOSIS — U071 COVID-19: Secondary | ICD-10-CM | POA: Diagnosis not present

## 2019-02-21 DIAGNOSIS — M6281 Muscle weakness (generalized): Secondary | ICD-10-CM | POA: Diagnosis not present

## 2019-02-21 DIAGNOSIS — G301 Alzheimer's disease with late onset: Secondary | ICD-10-CM | POA: Diagnosis not present

## 2019-02-21 DIAGNOSIS — M8949 Other hypertrophic osteoarthropathy, multiple sites: Secondary | ICD-10-CM | POA: Diagnosis not present

## 2019-02-21 DIAGNOSIS — R2689 Other abnormalities of gait and mobility: Secondary | ICD-10-CM | POA: Diagnosis not present

## 2019-02-21 DIAGNOSIS — M25562 Pain in left knee: Secondary | ICD-10-CM | POA: Diagnosis not present

## 2019-02-22 DIAGNOSIS — F4323 Adjustment disorder with mixed anxiety and depressed mood: Secondary | ICD-10-CM | POA: Diagnosis not present

## 2019-02-24 DIAGNOSIS — M8949 Other hypertrophic osteoarthropathy, multiple sites: Secondary | ICD-10-CM | POA: Diagnosis not present

## 2019-02-24 DIAGNOSIS — R2689 Other abnormalities of gait and mobility: Secondary | ICD-10-CM | POA: Diagnosis not present

## 2019-02-24 DIAGNOSIS — M25562 Pain in left knee: Secondary | ICD-10-CM | POA: Diagnosis not present

## 2019-02-24 DIAGNOSIS — G301 Alzheimer's disease with late onset: Secondary | ICD-10-CM | POA: Diagnosis not present

## 2019-02-24 DIAGNOSIS — U071 COVID-19: Secondary | ICD-10-CM | POA: Diagnosis not present

## 2019-02-24 DIAGNOSIS — M6281 Muscle weakness (generalized): Secondary | ICD-10-CM | POA: Diagnosis not present

## 2019-02-26 DIAGNOSIS — M25562 Pain in left knee: Secondary | ICD-10-CM | POA: Diagnosis not present

## 2019-02-26 DIAGNOSIS — M6281 Muscle weakness (generalized): Secondary | ICD-10-CM | POA: Diagnosis not present

## 2019-02-26 DIAGNOSIS — U071 COVID-19: Secondary | ICD-10-CM | POA: Diagnosis not present

## 2019-02-26 DIAGNOSIS — M8949 Other hypertrophic osteoarthropathy, multiple sites: Secondary | ICD-10-CM | POA: Diagnosis not present

## 2019-02-28 DIAGNOSIS — U071 COVID-19: Secondary | ICD-10-CM | POA: Diagnosis not present

## 2019-02-28 DIAGNOSIS — M8949 Other hypertrophic osteoarthropathy, multiple sites: Secondary | ICD-10-CM | POA: Diagnosis not present

## 2019-02-28 DIAGNOSIS — M6281 Muscle weakness (generalized): Secondary | ICD-10-CM | POA: Diagnosis not present

## 2019-02-28 DIAGNOSIS — M25562 Pain in left knee: Secondary | ICD-10-CM | POA: Diagnosis not present

## 2019-03-01 DIAGNOSIS — M8949 Other hypertrophic osteoarthropathy, multiple sites: Secondary | ICD-10-CM | POA: Diagnosis not present

## 2019-03-01 DIAGNOSIS — M25562 Pain in left knee: Secondary | ICD-10-CM | POA: Diagnosis not present

## 2019-03-01 DIAGNOSIS — U071 COVID-19: Secondary | ICD-10-CM | POA: Diagnosis not present

## 2019-03-01 DIAGNOSIS — M6281 Muscle weakness (generalized): Secondary | ICD-10-CM | POA: Diagnosis not present

## 2019-03-02 DIAGNOSIS — M6281 Muscle weakness (generalized): Secondary | ICD-10-CM | POA: Diagnosis not present

## 2019-03-02 DIAGNOSIS — U071 COVID-19: Secondary | ICD-10-CM | POA: Diagnosis not present

## 2019-03-02 DIAGNOSIS — M25562 Pain in left knee: Secondary | ICD-10-CM | POA: Diagnosis not present

## 2019-03-02 DIAGNOSIS — M8949 Other hypertrophic osteoarthropathy, multiple sites: Secondary | ICD-10-CM | POA: Diagnosis not present

## 2019-03-03 DIAGNOSIS — M6281 Muscle weakness (generalized): Secondary | ICD-10-CM | POA: Diagnosis not present

## 2019-03-03 DIAGNOSIS — U071 COVID-19: Secondary | ICD-10-CM | POA: Diagnosis not present

## 2019-03-03 DIAGNOSIS — M8949 Other hypertrophic osteoarthropathy, multiple sites: Secondary | ICD-10-CM | POA: Diagnosis not present

## 2019-03-03 DIAGNOSIS — M25562 Pain in left knee: Secondary | ICD-10-CM | POA: Diagnosis not present

## 2019-03-04 DIAGNOSIS — M6281 Muscle weakness (generalized): Secondary | ICD-10-CM | POA: Diagnosis not present

## 2019-03-04 DIAGNOSIS — U071 COVID-19: Secondary | ICD-10-CM | POA: Diagnosis not present

## 2019-03-04 DIAGNOSIS — M8949 Other hypertrophic osteoarthropathy, multiple sites: Secondary | ICD-10-CM | POA: Diagnosis not present

## 2019-03-04 DIAGNOSIS — M25562 Pain in left knee: Secondary | ICD-10-CM | POA: Diagnosis not present

## 2019-03-05 DIAGNOSIS — U071 COVID-19: Secondary | ICD-10-CM | POA: Diagnosis not present

## 2019-03-05 DIAGNOSIS — M25562 Pain in left knee: Secondary | ICD-10-CM | POA: Diagnosis not present

## 2019-03-05 DIAGNOSIS — M6281 Muscle weakness (generalized): Secondary | ICD-10-CM | POA: Diagnosis not present

## 2019-03-05 DIAGNOSIS — M8949 Other hypertrophic osteoarthropathy, multiple sites: Secondary | ICD-10-CM | POA: Diagnosis not present

## 2019-03-08 DIAGNOSIS — M6281 Muscle weakness (generalized): Secondary | ICD-10-CM | POA: Diagnosis not present

## 2019-03-08 DIAGNOSIS — U071 COVID-19: Secondary | ICD-10-CM | POA: Diagnosis not present

## 2019-03-08 DIAGNOSIS — M8949 Other hypertrophic osteoarthropathy, multiple sites: Secondary | ICD-10-CM | POA: Diagnosis not present

## 2019-03-08 DIAGNOSIS — M25562 Pain in left knee: Secondary | ICD-10-CM | POA: Diagnosis not present

## 2019-03-09 DIAGNOSIS — U071 COVID-19: Secondary | ICD-10-CM | POA: Diagnosis not present

## 2019-03-09 DIAGNOSIS — M6281 Muscle weakness (generalized): Secondary | ICD-10-CM | POA: Diagnosis not present

## 2019-03-09 DIAGNOSIS — M25562 Pain in left knee: Secondary | ICD-10-CM | POA: Diagnosis not present

## 2019-03-09 DIAGNOSIS — M8949 Other hypertrophic osteoarthropathy, multiple sites: Secondary | ICD-10-CM | POA: Diagnosis not present

## 2019-03-10 DIAGNOSIS — M8949 Other hypertrophic osteoarthropathy, multiple sites: Secondary | ICD-10-CM | POA: Diagnosis not present

## 2019-03-10 DIAGNOSIS — M25562 Pain in left knee: Secondary | ICD-10-CM | POA: Diagnosis not present

## 2019-03-10 DIAGNOSIS — M6281 Muscle weakness (generalized): Secondary | ICD-10-CM | POA: Diagnosis not present

## 2019-03-10 DIAGNOSIS — U071 COVID-19: Secondary | ICD-10-CM | POA: Diagnosis not present

## 2019-03-11 DIAGNOSIS — D638 Anemia in other chronic diseases classified elsewhere: Secondary | ICD-10-CM | POA: Diagnosis not present

## 2019-03-11 DIAGNOSIS — U071 COVID-19: Secondary | ICD-10-CM | POA: Diagnosis not present

## 2019-03-11 DIAGNOSIS — I10 Essential (primary) hypertension: Secondary | ICD-10-CM | POA: Diagnosis not present

## 2019-03-11 DIAGNOSIS — E039 Hypothyroidism, unspecified: Secondary | ICD-10-CM | POA: Diagnosis not present

## 2019-03-11 DIAGNOSIS — R5381 Other malaise: Secondary | ICD-10-CM | POA: Diagnosis not present

## 2019-03-11 DIAGNOSIS — M6281 Muscle weakness (generalized): Secondary | ICD-10-CM | POA: Diagnosis not present

## 2019-03-11 DIAGNOSIS — M8949 Other hypertrophic osteoarthropathy, multiple sites: Secondary | ICD-10-CM | POA: Diagnosis not present

## 2019-03-11 DIAGNOSIS — M25562 Pain in left knee: Secondary | ICD-10-CM | POA: Diagnosis not present

## 2019-03-15 DIAGNOSIS — M8949 Other hypertrophic osteoarthropathy, multiple sites: Secondary | ICD-10-CM | POA: Diagnosis not present

## 2019-03-15 DIAGNOSIS — U071 COVID-19: Secondary | ICD-10-CM | POA: Diagnosis not present

## 2019-03-15 DIAGNOSIS — M25562 Pain in left knee: Secondary | ICD-10-CM | POA: Diagnosis not present

## 2019-03-15 DIAGNOSIS — M6281 Muscle weakness (generalized): Secondary | ICD-10-CM | POA: Diagnosis not present

## 2019-03-17 DIAGNOSIS — U071 COVID-19: Secondary | ICD-10-CM | POA: Diagnosis not present

## 2019-03-17 DIAGNOSIS — G4701 Insomnia due to medical condition: Secondary | ICD-10-CM | POA: Diagnosis not present

## 2019-03-17 DIAGNOSIS — M8949 Other hypertrophic osteoarthropathy, multiple sites: Secondary | ICD-10-CM | POA: Diagnosis not present

## 2019-03-17 DIAGNOSIS — M25562 Pain in left knee: Secondary | ICD-10-CM | POA: Diagnosis not present

## 2019-03-17 DIAGNOSIS — F0280 Dementia in other diseases classified elsewhere without behavioral disturbance: Secondary | ICD-10-CM | POA: Diagnosis not present

## 2019-03-17 DIAGNOSIS — G301 Alzheimer's disease with late onset: Secondary | ICD-10-CM | POA: Diagnosis not present

## 2019-03-17 DIAGNOSIS — M6281 Muscle weakness (generalized): Secondary | ICD-10-CM | POA: Diagnosis not present

## 2019-03-18 DIAGNOSIS — M25562 Pain in left knee: Secondary | ICD-10-CM | POA: Diagnosis not present

## 2019-03-18 DIAGNOSIS — M6281 Muscle weakness (generalized): Secondary | ICD-10-CM | POA: Diagnosis not present

## 2019-03-18 DIAGNOSIS — U071 COVID-19: Secondary | ICD-10-CM | POA: Diagnosis not present

## 2019-03-18 DIAGNOSIS — M8949 Other hypertrophic osteoarthropathy, multiple sites: Secondary | ICD-10-CM | POA: Diagnosis not present

## 2019-03-19 DIAGNOSIS — M25562 Pain in left knee: Secondary | ICD-10-CM | POA: Diagnosis not present

## 2019-03-19 DIAGNOSIS — U071 COVID-19: Secondary | ICD-10-CM | POA: Diagnosis not present

## 2019-03-19 DIAGNOSIS — M8949 Other hypertrophic osteoarthropathy, multiple sites: Secondary | ICD-10-CM | POA: Diagnosis not present

## 2019-03-19 DIAGNOSIS — M6281 Muscle weakness (generalized): Secondary | ICD-10-CM | POA: Diagnosis not present

## 2019-03-25 DIAGNOSIS — M6281 Muscle weakness (generalized): Secondary | ICD-10-CM | POA: Diagnosis not present

## 2019-03-25 DIAGNOSIS — M25562 Pain in left knee: Secondary | ICD-10-CM | POA: Diagnosis not present

## 2019-03-25 DIAGNOSIS — U071 COVID-19: Secondary | ICD-10-CM | POA: Diagnosis not present

## 2019-03-25 DIAGNOSIS — M8949 Other hypertrophic osteoarthropathy, multiple sites: Secondary | ICD-10-CM | POA: Diagnosis not present

## 2019-03-27 DIAGNOSIS — M25562 Pain in left knee: Secondary | ICD-10-CM | POA: Diagnosis not present

## 2019-03-27 DIAGNOSIS — M6281 Muscle weakness (generalized): Secondary | ICD-10-CM | POA: Diagnosis not present

## 2019-03-27 DIAGNOSIS — U071 COVID-19: Secondary | ICD-10-CM | POA: Diagnosis not present

## 2019-03-27 DIAGNOSIS — M8949 Other hypertrophic osteoarthropathy, multiple sites: Secondary | ICD-10-CM | POA: Diagnosis not present

## 2019-03-31 DIAGNOSIS — D638 Anemia in other chronic diseases classified elsewhere: Secondary | ICD-10-CM | POA: Diagnosis not present

## 2019-03-31 DIAGNOSIS — E039 Hypothyroidism, unspecified: Secondary | ICD-10-CM | POA: Diagnosis not present

## 2019-03-31 DIAGNOSIS — R5381 Other malaise: Secondary | ICD-10-CM | POA: Diagnosis not present

## 2019-03-31 DIAGNOSIS — I10 Essential (primary) hypertension: Secondary | ICD-10-CM | POA: Diagnosis not present

## 2019-04-08 DIAGNOSIS — E039 Hypothyroidism, unspecified: Secondary | ICD-10-CM | POA: Diagnosis not present

## 2019-04-08 DIAGNOSIS — R5381 Other malaise: Secondary | ICD-10-CM | POA: Diagnosis not present

## 2019-04-08 DIAGNOSIS — I10 Essential (primary) hypertension: Secondary | ICD-10-CM | POA: Diagnosis not present

## 2019-04-08 DIAGNOSIS — D638 Anemia in other chronic diseases classified elsewhere: Secondary | ICD-10-CM | POA: Diagnosis not present

## 2019-04-12 DIAGNOSIS — Z20828 Contact with and (suspected) exposure to other viral communicable diseases: Secondary | ICD-10-CM | POA: Diagnosis not present

## 2019-04-16 DIAGNOSIS — Z20828 Contact with and (suspected) exposure to other viral communicable diseases: Secondary | ICD-10-CM | POA: Diagnosis not present

## 2019-04-19 DIAGNOSIS — Z20828 Contact with and (suspected) exposure to other viral communicable diseases: Secondary | ICD-10-CM | POA: Diagnosis not present

## 2019-04-21 DIAGNOSIS — F0280 Dementia in other diseases classified elsewhere without behavioral disturbance: Secondary | ICD-10-CM | POA: Diagnosis not present

## 2019-04-21 DIAGNOSIS — G301 Alzheimer's disease with late onset: Secondary | ICD-10-CM | POA: Diagnosis not present

## 2019-04-21 DIAGNOSIS — G4701 Insomnia due to medical condition: Secondary | ICD-10-CM | POA: Diagnosis not present

## 2019-04-25 DIAGNOSIS — Z20828 Contact with and (suspected) exposure to other viral communicable diseases: Secondary | ICD-10-CM | POA: Diagnosis not present

## 2019-04-26 DIAGNOSIS — M8949 Other hypertrophic osteoarthropathy, multiple sites: Secondary | ICD-10-CM | POA: Diagnosis not present

## 2019-04-26 DIAGNOSIS — G301 Alzheimer's disease with late onset: Secondary | ICD-10-CM | POA: Diagnosis not present

## 2019-04-26 DIAGNOSIS — M6281 Muscle weakness (generalized): Secondary | ICD-10-CM | POA: Diagnosis not present

## 2019-04-26 DIAGNOSIS — D649 Anemia, unspecified: Secondary | ICD-10-CM | POA: Diagnosis not present

## 2019-04-26 DIAGNOSIS — R2689 Other abnormalities of gait and mobility: Secondary | ICD-10-CM | POA: Diagnosis not present

## 2019-04-27 DIAGNOSIS — M8949 Other hypertrophic osteoarthropathy, multiple sites: Secondary | ICD-10-CM | POA: Diagnosis not present

## 2019-04-27 DIAGNOSIS — G301 Alzheimer's disease with late onset: Secondary | ICD-10-CM | POA: Diagnosis not present

## 2019-04-27 DIAGNOSIS — M6281 Muscle weakness (generalized): Secondary | ICD-10-CM | POA: Diagnosis not present

## 2019-04-27 DIAGNOSIS — D649 Anemia, unspecified: Secondary | ICD-10-CM | POA: Diagnosis not present

## 2019-04-27 DIAGNOSIS — R2689 Other abnormalities of gait and mobility: Secondary | ICD-10-CM | POA: Diagnosis not present

## 2019-04-28 DIAGNOSIS — G301 Alzheimer's disease with late onset: Secondary | ICD-10-CM | POA: Diagnosis not present

## 2019-04-28 DIAGNOSIS — M6281 Muscle weakness (generalized): Secondary | ICD-10-CM | POA: Diagnosis not present

## 2019-04-28 DIAGNOSIS — E039 Hypothyroidism, unspecified: Secondary | ICD-10-CM | POA: Diagnosis not present

## 2019-04-28 DIAGNOSIS — M8949 Other hypertrophic osteoarthropathy, multiple sites: Secondary | ICD-10-CM | POA: Diagnosis not present

## 2019-04-28 DIAGNOSIS — D638 Anemia in other chronic diseases classified elsewhere: Secondary | ICD-10-CM | POA: Diagnosis not present

## 2019-04-28 DIAGNOSIS — D649 Anemia, unspecified: Secondary | ICD-10-CM | POA: Diagnosis not present

## 2019-04-28 DIAGNOSIS — I10 Essential (primary) hypertension: Secondary | ICD-10-CM | POA: Diagnosis not present

## 2019-04-28 DIAGNOSIS — R5381 Other malaise: Secondary | ICD-10-CM | POA: Diagnosis not present

## 2019-04-28 DIAGNOSIS — R2689 Other abnormalities of gait and mobility: Secondary | ICD-10-CM | POA: Diagnosis not present

## 2019-04-29 DIAGNOSIS — G301 Alzheimer's disease with late onset: Secondary | ICD-10-CM | POA: Diagnosis not present

## 2019-04-29 DIAGNOSIS — D649 Anemia, unspecified: Secondary | ICD-10-CM | POA: Diagnosis not present

## 2019-04-29 DIAGNOSIS — M8949 Other hypertrophic osteoarthropathy, multiple sites: Secondary | ICD-10-CM | POA: Diagnosis not present

## 2019-04-29 DIAGNOSIS — R2689 Other abnormalities of gait and mobility: Secondary | ICD-10-CM | POA: Diagnosis not present

## 2019-04-29 DIAGNOSIS — M6281 Muscle weakness (generalized): Secondary | ICD-10-CM | POA: Diagnosis not present

## 2019-05-02 DIAGNOSIS — D649 Anemia, unspecified: Secondary | ICD-10-CM | POA: Diagnosis not present

## 2019-05-02 DIAGNOSIS — R2689 Other abnormalities of gait and mobility: Secondary | ICD-10-CM | POA: Diagnosis not present

## 2019-05-02 DIAGNOSIS — M8949 Other hypertrophic osteoarthropathy, multiple sites: Secondary | ICD-10-CM | POA: Diagnosis not present

## 2019-05-02 DIAGNOSIS — Z20828 Contact with and (suspected) exposure to other viral communicable diseases: Secondary | ICD-10-CM | POA: Diagnosis not present

## 2019-05-02 DIAGNOSIS — G301 Alzheimer's disease with late onset: Secondary | ICD-10-CM | POA: Diagnosis not present

## 2019-05-02 DIAGNOSIS — M6281 Muscle weakness (generalized): Secondary | ICD-10-CM | POA: Diagnosis not present

## 2019-05-03 DIAGNOSIS — M6281 Muscle weakness (generalized): Secondary | ICD-10-CM | POA: Diagnosis not present

## 2019-05-03 DIAGNOSIS — M8949 Other hypertrophic osteoarthropathy, multiple sites: Secondary | ICD-10-CM | POA: Diagnosis not present

## 2019-05-03 DIAGNOSIS — D649 Anemia, unspecified: Secondary | ICD-10-CM | POA: Diagnosis not present

## 2019-05-03 DIAGNOSIS — G301 Alzheimer's disease with late onset: Secondary | ICD-10-CM | POA: Diagnosis not present

## 2019-05-03 DIAGNOSIS — R2689 Other abnormalities of gait and mobility: Secondary | ICD-10-CM | POA: Diagnosis not present

## 2019-05-04 DIAGNOSIS — D649 Anemia, unspecified: Secondary | ICD-10-CM | POA: Diagnosis not present

## 2019-05-04 DIAGNOSIS — G301 Alzheimer's disease with late onset: Secondary | ICD-10-CM | POA: Diagnosis not present

## 2019-05-04 DIAGNOSIS — M6281 Muscle weakness (generalized): Secondary | ICD-10-CM | POA: Diagnosis not present

## 2019-05-04 DIAGNOSIS — R2689 Other abnormalities of gait and mobility: Secondary | ICD-10-CM | POA: Diagnosis not present

## 2019-05-04 DIAGNOSIS — M8949 Other hypertrophic osteoarthropathy, multiple sites: Secondary | ICD-10-CM | POA: Diagnosis not present

## 2019-05-05 DIAGNOSIS — G301 Alzheimer's disease with late onset: Secondary | ICD-10-CM | POA: Diagnosis not present

## 2019-05-05 DIAGNOSIS — D649 Anemia, unspecified: Secondary | ICD-10-CM | POA: Diagnosis not present

## 2019-05-05 DIAGNOSIS — M6281 Muscle weakness (generalized): Secondary | ICD-10-CM | POA: Diagnosis not present

## 2019-05-05 DIAGNOSIS — R2689 Other abnormalities of gait and mobility: Secondary | ICD-10-CM | POA: Diagnosis not present

## 2019-05-05 DIAGNOSIS — M8949 Other hypertrophic osteoarthropathy, multiple sites: Secondary | ICD-10-CM | POA: Diagnosis not present

## 2019-05-06 DIAGNOSIS — R2689 Other abnormalities of gait and mobility: Secondary | ICD-10-CM | POA: Diagnosis not present

## 2019-05-06 DIAGNOSIS — M8949 Other hypertrophic osteoarthropathy, multiple sites: Secondary | ICD-10-CM | POA: Diagnosis not present

## 2019-05-06 DIAGNOSIS — F0280 Dementia in other diseases classified elsewhere without behavioral disturbance: Secondary | ICD-10-CM | POA: Diagnosis not present

## 2019-05-06 DIAGNOSIS — D649 Anemia, unspecified: Secondary | ICD-10-CM | POA: Diagnosis not present

## 2019-05-06 DIAGNOSIS — R5381 Other malaise: Secondary | ICD-10-CM | POA: Diagnosis not present

## 2019-05-06 DIAGNOSIS — M6281 Muscle weakness (generalized): Secondary | ICD-10-CM | POA: Diagnosis not present

## 2019-05-06 DIAGNOSIS — F4323 Adjustment disorder with mixed anxiety and depressed mood: Secondary | ICD-10-CM | POA: Diagnosis not present

## 2019-05-06 DIAGNOSIS — G301 Alzheimer's disease with late onset: Secondary | ICD-10-CM | POA: Diagnosis not present

## 2019-05-09 DIAGNOSIS — M6281 Muscle weakness (generalized): Secondary | ICD-10-CM | POA: Diagnosis not present

## 2019-05-09 DIAGNOSIS — Z20828 Contact with and (suspected) exposure to other viral communicable diseases: Secondary | ICD-10-CM | POA: Diagnosis not present

## 2019-05-09 DIAGNOSIS — M8949 Other hypertrophic osteoarthropathy, multiple sites: Secondary | ICD-10-CM | POA: Diagnosis not present

## 2019-05-09 DIAGNOSIS — G301 Alzheimer's disease with late onset: Secondary | ICD-10-CM | POA: Diagnosis not present

## 2019-05-09 DIAGNOSIS — D649 Anemia, unspecified: Secondary | ICD-10-CM | POA: Diagnosis not present

## 2019-05-09 DIAGNOSIS — R2689 Other abnormalities of gait and mobility: Secondary | ICD-10-CM | POA: Diagnosis not present

## 2019-05-10 DIAGNOSIS — R2689 Other abnormalities of gait and mobility: Secondary | ICD-10-CM | POA: Diagnosis not present

## 2019-05-10 DIAGNOSIS — G301 Alzheimer's disease with late onset: Secondary | ICD-10-CM | POA: Diagnosis not present

## 2019-05-10 DIAGNOSIS — D649 Anemia, unspecified: Secondary | ICD-10-CM | POA: Diagnosis not present

## 2019-05-10 DIAGNOSIS — E782 Mixed hyperlipidemia: Secondary | ICD-10-CM | POA: Diagnosis not present

## 2019-05-10 DIAGNOSIS — M8949 Other hypertrophic osteoarthropathy, multiple sites: Secondary | ICD-10-CM | POA: Diagnosis not present

## 2019-05-10 DIAGNOSIS — I10 Essential (primary) hypertension: Secondary | ICD-10-CM | POA: Diagnosis not present

## 2019-05-10 DIAGNOSIS — E039 Hypothyroidism, unspecified: Secondary | ICD-10-CM | POA: Diagnosis not present

## 2019-05-10 DIAGNOSIS — E785 Hyperlipidemia, unspecified: Secondary | ICD-10-CM | POA: Diagnosis not present

## 2019-05-10 DIAGNOSIS — M6281 Muscle weakness (generalized): Secondary | ICD-10-CM | POA: Diagnosis not present

## 2019-05-11 DIAGNOSIS — R2689 Other abnormalities of gait and mobility: Secondary | ICD-10-CM | POA: Diagnosis not present

## 2019-05-11 DIAGNOSIS — M8949 Other hypertrophic osteoarthropathy, multiple sites: Secondary | ICD-10-CM | POA: Diagnosis not present

## 2019-05-11 DIAGNOSIS — D649 Anemia, unspecified: Secondary | ICD-10-CM | POA: Diagnosis not present

## 2019-05-11 DIAGNOSIS — M6281 Muscle weakness (generalized): Secondary | ICD-10-CM | POA: Diagnosis not present

## 2019-05-11 DIAGNOSIS — G301 Alzheimer's disease with late onset: Secondary | ICD-10-CM | POA: Diagnosis not present

## 2019-05-12 DIAGNOSIS — D649 Anemia, unspecified: Secondary | ICD-10-CM | POA: Diagnosis not present

## 2019-05-12 DIAGNOSIS — F0280 Dementia in other diseases classified elsewhere without behavioral disturbance: Secondary | ICD-10-CM | POA: Diagnosis not present

## 2019-05-12 DIAGNOSIS — R2689 Other abnormalities of gait and mobility: Secondary | ICD-10-CM | POA: Diagnosis not present

## 2019-05-12 DIAGNOSIS — M6281 Muscle weakness (generalized): Secondary | ICD-10-CM | POA: Diagnosis not present

## 2019-05-12 DIAGNOSIS — G4701 Insomnia due to medical condition: Secondary | ICD-10-CM | POA: Diagnosis not present

## 2019-05-12 DIAGNOSIS — M8949 Other hypertrophic osteoarthropathy, multiple sites: Secondary | ICD-10-CM | POA: Diagnosis not present

## 2019-05-12 DIAGNOSIS — G301 Alzheimer's disease with late onset: Secondary | ICD-10-CM | POA: Diagnosis not present

## 2019-05-13 DIAGNOSIS — D649 Anemia, unspecified: Secondary | ICD-10-CM | POA: Diagnosis not present

## 2019-05-13 DIAGNOSIS — G301 Alzheimer's disease with late onset: Secondary | ICD-10-CM | POA: Diagnosis not present

## 2019-05-13 DIAGNOSIS — M6281 Muscle weakness (generalized): Secondary | ICD-10-CM | POA: Diagnosis not present

## 2019-05-13 DIAGNOSIS — R2689 Other abnormalities of gait and mobility: Secondary | ICD-10-CM | POA: Diagnosis not present

## 2019-05-13 DIAGNOSIS — M8949 Other hypertrophic osteoarthropathy, multiple sites: Secondary | ICD-10-CM | POA: Diagnosis not present

## 2019-05-16 DIAGNOSIS — G301 Alzheimer's disease with late onset: Secondary | ICD-10-CM | POA: Diagnosis not present

## 2019-05-16 DIAGNOSIS — M6281 Muscle weakness (generalized): Secondary | ICD-10-CM | POA: Diagnosis not present

## 2019-05-16 DIAGNOSIS — Z20828 Contact with and (suspected) exposure to other viral communicable diseases: Secondary | ICD-10-CM | POA: Diagnosis not present

## 2019-05-16 DIAGNOSIS — D649 Anemia, unspecified: Secondary | ICD-10-CM | POA: Diagnosis not present

## 2019-05-16 DIAGNOSIS — M8949 Other hypertrophic osteoarthropathy, multiple sites: Secondary | ICD-10-CM | POA: Diagnosis not present

## 2019-05-16 DIAGNOSIS — R2689 Other abnormalities of gait and mobility: Secondary | ICD-10-CM | POA: Diagnosis not present

## 2019-05-17 DIAGNOSIS — G301 Alzheimer's disease with late onset: Secondary | ICD-10-CM | POA: Diagnosis not present

## 2019-05-17 DIAGNOSIS — R2689 Other abnormalities of gait and mobility: Secondary | ICD-10-CM | POA: Diagnosis not present

## 2019-05-17 DIAGNOSIS — M6281 Muscle weakness (generalized): Secondary | ICD-10-CM | POA: Diagnosis not present

## 2019-05-17 DIAGNOSIS — D649 Anemia, unspecified: Secondary | ICD-10-CM | POA: Diagnosis not present

## 2019-05-17 DIAGNOSIS — M8949 Other hypertrophic osteoarthropathy, multiple sites: Secondary | ICD-10-CM | POA: Diagnosis not present

## 2019-05-18 DIAGNOSIS — R2689 Other abnormalities of gait and mobility: Secondary | ICD-10-CM | POA: Diagnosis not present

## 2019-05-18 DIAGNOSIS — M8949 Other hypertrophic osteoarthropathy, multiple sites: Secondary | ICD-10-CM | POA: Diagnosis not present

## 2019-05-18 DIAGNOSIS — D649 Anemia, unspecified: Secondary | ICD-10-CM | POA: Diagnosis not present

## 2019-05-18 DIAGNOSIS — M6281 Muscle weakness (generalized): Secondary | ICD-10-CM | POA: Diagnosis not present

## 2019-05-18 DIAGNOSIS — G301 Alzheimer's disease with late onset: Secondary | ICD-10-CM | POA: Diagnosis not present

## 2019-05-19 DIAGNOSIS — D649 Anemia, unspecified: Secondary | ICD-10-CM | POA: Diagnosis not present

## 2019-05-19 DIAGNOSIS — G301 Alzheimer's disease with late onset: Secondary | ICD-10-CM | POA: Diagnosis not present

## 2019-05-19 DIAGNOSIS — R2689 Other abnormalities of gait and mobility: Secondary | ICD-10-CM | POA: Diagnosis not present

## 2019-05-19 DIAGNOSIS — M6281 Muscle weakness (generalized): Secondary | ICD-10-CM | POA: Diagnosis not present

## 2019-05-19 DIAGNOSIS — M8949 Other hypertrophic osteoarthropathy, multiple sites: Secondary | ICD-10-CM | POA: Diagnosis not present

## 2019-05-20 DIAGNOSIS — G301 Alzheimer's disease with late onset: Secondary | ICD-10-CM | POA: Diagnosis not present

## 2019-05-20 DIAGNOSIS — D649 Anemia, unspecified: Secondary | ICD-10-CM | POA: Diagnosis not present

## 2019-05-20 DIAGNOSIS — M8949 Other hypertrophic osteoarthropathy, multiple sites: Secondary | ICD-10-CM | POA: Diagnosis not present

## 2019-05-20 DIAGNOSIS — R2689 Other abnormalities of gait and mobility: Secondary | ICD-10-CM | POA: Diagnosis not present

## 2019-05-20 DIAGNOSIS — M6281 Muscle weakness (generalized): Secondary | ICD-10-CM | POA: Diagnosis not present

## 2019-05-20 DIAGNOSIS — F4323 Adjustment disorder with mixed anxiety and depressed mood: Secondary | ICD-10-CM | POA: Diagnosis not present

## 2019-05-22 DIAGNOSIS — Z20828 Contact with and (suspected) exposure to other viral communicable diseases: Secondary | ICD-10-CM | POA: Diagnosis not present

## 2019-05-23 DIAGNOSIS — D649 Anemia, unspecified: Secondary | ICD-10-CM | POA: Diagnosis not present

## 2019-05-23 DIAGNOSIS — R2689 Other abnormalities of gait and mobility: Secondary | ICD-10-CM | POA: Diagnosis not present

## 2019-05-23 DIAGNOSIS — M6281 Muscle weakness (generalized): Secondary | ICD-10-CM | POA: Diagnosis not present

## 2019-05-23 DIAGNOSIS — M8949 Other hypertrophic osteoarthropathy, multiple sites: Secondary | ICD-10-CM | POA: Diagnosis not present

## 2019-05-23 DIAGNOSIS — G301 Alzheimer's disease with late onset: Secondary | ICD-10-CM | POA: Diagnosis not present

## 2019-05-24 DIAGNOSIS — D649 Anemia, unspecified: Secondary | ICD-10-CM | POA: Diagnosis not present

## 2019-05-24 DIAGNOSIS — M6281 Muscle weakness (generalized): Secondary | ICD-10-CM | POA: Diagnosis not present

## 2019-05-24 DIAGNOSIS — R2689 Other abnormalities of gait and mobility: Secondary | ICD-10-CM | POA: Diagnosis not present

## 2019-05-24 DIAGNOSIS — M8949 Other hypertrophic osteoarthropathy, multiple sites: Secondary | ICD-10-CM | POA: Diagnosis not present

## 2019-05-24 DIAGNOSIS — G301 Alzheimer's disease with late onset: Secondary | ICD-10-CM | POA: Diagnosis not present

## 2019-05-25 DIAGNOSIS — M8949 Other hypertrophic osteoarthropathy, multiple sites: Secondary | ICD-10-CM | POA: Diagnosis not present

## 2019-05-25 DIAGNOSIS — M6281 Muscle weakness (generalized): Secondary | ICD-10-CM | POA: Diagnosis not present

## 2019-05-25 DIAGNOSIS — Z20828 Contact with and (suspected) exposure to other viral communicable diseases: Secondary | ICD-10-CM | POA: Diagnosis not present

## 2019-05-25 DIAGNOSIS — G301 Alzheimer's disease with late onset: Secondary | ICD-10-CM | POA: Diagnosis not present

## 2019-05-25 DIAGNOSIS — D649 Anemia, unspecified: Secondary | ICD-10-CM | POA: Diagnosis not present

## 2019-05-25 DIAGNOSIS — R2689 Other abnormalities of gait and mobility: Secondary | ICD-10-CM | POA: Diagnosis not present

## 2019-05-26 DIAGNOSIS — G301 Alzheimer's disease with late onset: Secondary | ICD-10-CM | POA: Diagnosis not present

## 2019-05-26 DIAGNOSIS — M6281 Muscle weakness (generalized): Secondary | ICD-10-CM | POA: Diagnosis not present

## 2019-05-26 DIAGNOSIS — D649 Anemia, unspecified: Secondary | ICD-10-CM | POA: Diagnosis not present

## 2019-05-26 DIAGNOSIS — R2689 Other abnormalities of gait and mobility: Secondary | ICD-10-CM | POA: Diagnosis not present

## 2019-05-26 DIAGNOSIS — M8949 Other hypertrophic osteoarthropathy, multiple sites: Secondary | ICD-10-CM | POA: Diagnosis not present

## 2019-05-27 DIAGNOSIS — M8949 Other hypertrophic osteoarthropathy, multiple sites: Secondary | ICD-10-CM | POA: Diagnosis not present

## 2019-05-27 DIAGNOSIS — D649 Anemia, unspecified: Secondary | ICD-10-CM | POA: Diagnosis not present

## 2019-05-27 DIAGNOSIS — R2689 Other abnormalities of gait and mobility: Secondary | ICD-10-CM | POA: Diagnosis not present

## 2019-05-27 DIAGNOSIS — M6281 Muscle weakness (generalized): Secondary | ICD-10-CM | POA: Diagnosis not present

## 2019-05-27 DIAGNOSIS — G301 Alzheimer's disease with late onset: Secondary | ICD-10-CM | POA: Diagnosis not present

## 2019-05-31 DIAGNOSIS — Z03818 Encounter for observation for suspected exposure to other biological agents ruled out: Secondary | ICD-10-CM | POA: Diagnosis not present

## 2019-06-07 DIAGNOSIS — Z20828 Contact with and (suspected) exposure to other viral communicable diseases: Secondary | ICD-10-CM | POA: Diagnosis not present

## 2019-06-07 DIAGNOSIS — F4323 Adjustment disorder with mixed anxiety and depressed mood: Secondary | ICD-10-CM | POA: Diagnosis not present

## 2019-06-09 DIAGNOSIS — F0280 Dementia in other diseases classified elsewhere without behavioral disturbance: Secondary | ICD-10-CM | POA: Diagnosis not present

## 2019-06-09 DIAGNOSIS — G301 Alzheimer's disease with late onset: Secondary | ICD-10-CM | POA: Diagnosis not present

## 2019-06-09 DIAGNOSIS — G4701 Insomnia due to medical condition: Secondary | ICD-10-CM | POA: Diagnosis not present

## 2019-06-13 DIAGNOSIS — F0280 Dementia in other diseases classified elsewhere without behavioral disturbance: Secondary | ICD-10-CM | POA: Diagnosis not present

## 2019-06-13 DIAGNOSIS — G301 Alzheimer's disease with late onset: Secondary | ICD-10-CM | POA: Diagnosis not present

## 2019-06-13 DIAGNOSIS — E782 Mixed hyperlipidemia: Secondary | ICD-10-CM | POA: Diagnosis not present

## 2019-06-13 DIAGNOSIS — H353120 Nonexudative age-related macular degeneration, left eye, stage unspecified: Secondary | ICD-10-CM | POA: Diagnosis not present

## 2019-06-15 DIAGNOSIS — Z20828 Contact with and (suspected) exposure to other viral communicable diseases: Secondary | ICD-10-CM | POA: Diagnosis not present

## 2019-06-21 DIAGNOSIS — H524 Presbyopia: Secondary | ICD-10-CM | POA: Diagnosis not present

## 2019-06-21 DIAGNOSIS — H353133 Nonexudative age-related macular degeneration, bilateral, advanced atrophic without subfoveal involvement: Secondary | ICD-10-CM | POA: Diagnosis not present

## 2019-06-21 DIAGNOSIS — Z961 Presence of intraocular lens: Secondary | ICD-10-CM | POA: Diagnosis not present

## 2019-06-21 DIAGNOSIS — H04123 Dry eye syndrome of bilateral lacrimal glands: Secondary | ICD-10-CM | POA: Diagnosis not present

## 2019-06-21 DIAGNOSIS — Z20828 Contact with and (suspected) exposure to other viral communicable diseases: Secondary | ICD-10-CM | POA: Diagnosis not present

## 2019-06-24 DIAGNOSIS — F4323 Adjustment disorder with mixed anxiety and depressed mood: Secondary | ICD-10-CM | POA: Diagnosis not present

## 2019-06-28 DIAGNOSIS — F4323 Adjustment disorder with mixed anxiety and depressed mood: Secondary | ICD-10-CM | POA: Diagnosis not present

## 2019-06-30 DIAGNOSIS — M8949 Other hypertrophic osteoarthropathy, multiple sites: Secondary | ICD-10-CM | POA: Diagnosis not present

## 2019-06-30 DIAGNOSIS — Z20828 Contact with and (suspected) exposure to other viral communicable diseases: Secondary | ICD-10-CM | POA: Diagnosis not present

## 2019-06-30 DIAGNOSIS — F0280 Dementia in other diseases classified elsewhere without behavioral disturbance: Secondary | ICD-10-CM | POA: Diagnosis not present

## 2019-06-30 DIAGNOSIS — G301 Alzheimer's disease with late onset: Secondary | ICD-10-CM | POA: Diagnosis not present

## 2019-06-30 DIAGNOSIS — J309 Allergic rhinitis, unspecified: Secondary | ICD-10-CM | POA: Diagnosis not present

## 2019-07-04 DIAGNOSIS — G301 Alzheimer's disease with late onset: Secondary | ICD-10-CM | POA: Diagnosis not present

## 2019-07-04 DIAGNOSIS — F0280 Dementia in other diseases classified elsewhere without behavioral disturbance: Secondary | ICD-10-CM | POA: Diagnosis not present

## 2019-07-04 DIAGNOSIS — G4701 Insomnia due to medical condition: Secondary | ICD-10-CM | POA: Diagnosis not present

## 2019-07-04 DIAGNOSIS — F5101 Primary insomnia: Secondary | ICD-10-CM | POA: Diagnosis not present

## 2019-07-05 DIAGNOSIS — Z20828 Contact with and (suspected) exposure to other viral communicable diseases: Secondary | ICD-10-CM | POA: Diagnosis not present

## 2019-07-05 DIAGNOSIS — F4323 Adjustment disorder with mixed anxiety and depressed mood: Secondary | ICD-10-CM | POA: Diagnosis not present

## 2019-07-07 DIAGNOSIS — Z20828 Contact with and (suspected) exposure to other viral communicable diseases: Secondary | ICD-10-CM | POA: Diagnosis not present

## 2019-07-12 DIAGNOSIS — M2042 Other hammer toe(s) (acquired), left foot: Secondary | ICD-10-CM | POA: Diagnosis not present

## 2019-07-12 DIAGNOSIS — M2041 Other hammer toe(s) (acquired), right foot: Secondary | ICD-10-CM | POA: Diagnosis not present

## 2019-07-12 DIAGNOSIS — B351 Tinea unguium: Secondary | ICD-10-CM | POA: Diagnosis not present

## 2019-07-12 DIAGNOSIS — I739 Peripheral vascular disease, unspecified: Secondary | ICD-10-CM | POA: Diagnosis not present

## 2019-07-13 DIAGNOSIS — G301 Alzheimer's disease with late onset: Secondary | ICD-10-CM | POA: Diagnosis not present

## 2019-07-13 DIAGNOSIS — H353120 Nonexudative age-related macular degeneration, left eye, stage unspecified: Secondary | ICD-10-CM | POA: Diagnosis not present

## 2019-07-13 DIAGNOSIS — Z20828 Contact with and (suspected) exposure to other viral communicable diseases: Secondary | ICD-10-CM | POA: Diagnosis not present

## 2019-07-13 DIAGNOSIS — E782 Mixed hyperlipidemia: Secondary | ICD-10-CM | POA: Diagnosis not present

## 2019-07-13 DIAGNOSIS — M8949 Other hypertrophic osteoarthropathy, multiple sites: Secondary | ICD-10-CM | POA: Diagnosis not present

## 2019-07-14 DIAGNOSIS — Z20828 Contact with and (suspected) exposure to other viral communicable diseases: Secondary | ICD-10-CM | POA: Diagnosis not present

## 2019-07-15 DIAGNOSIS — F4323 Adjustment disorder with mixed anxiety and depressed mood: Secondary | ICD-10-CM | POA: Diagnosis not present

## 2019-07-25 DIAGNOSIS — G301 Alzheimer's disease with late onset: Secondary | ICD-10-CM | POA: Diagnosis not present

## 2019-07-25 DIAGNOSIS — M25541 Pain in joints of right hand: Secondary | ICD-10-CM | POA: Diagnosis not present

## 2019-07-25 DIAGNOSIS — M6281 Muscle weakness (generalized): Secondary | ICD-10-CM | POA: Diagnosis not present

## 2019-07-25 DIAGNOSIS — M25542 Pain in joints of left hand: Secondary | ICD-10-CM | POA: Diagnosis not present

## 2019-07-26 DIAGNOSIS — M25541 Pain in joints of right hand: Secondary | ICD-10-CM | POA: Diagnosis not present

## 2019-07-26 DIAGNOSIS — M6281 Muscle weakness (generalized): Secondary | ICD-10-CM | POA: Diagnosis not present

## 2019-07-26 DIAGNOSIS — R2689 Other abnormalities of gait and mobility: Secondary | ICD-10-CM | POA: Diagnosis not present

## 2019-07-26 DIAGNOSIS — I10 Essential (primary) hypertension: Secondary | ICD-10-CM | POA: Diagnosis not present

## 2019-07-26 DIAGNOSIS — G301 Alzheimer's disease with late onset: Secondary | ICD-10-CM | POA: Diagnosis not present

## 2019-07-26 DIAGNOSIS — M25562 Pain in left knee: Secondary | ICD-10-CM | POA: Diagnosis not present

## 2019-07-26 DIAGNOSIS — M25542 Pain in joints of left hand: Secondary | ICD-10-CM | POA: Diagnosis not present

## 2019-07-27 DIAGNOSIS — G301 Alzheimer's disease with late onset: Secondary | ICD-10-CM | POA: Diagnosis not present

## 2019-07-27 DIAGNOSIS — M6281 Muscle weakness (generalized): Secondary | ICD-10-CM | POA: Diagnosis not present

## 2019-07-27 DIAGNOSIS — I10 Essential (primary) hypertension: Secondary | ICD-10-CM | POA: Diagnosis not present

## 2019-07-27 DIAGNOSIS — M25562 Pain in left knee: Secondary | ICD-10-CM | POA: Diagnosis not present

## 2019-07-27 DIAGNOSIS — R2689 Other abnormalities of gait and mobility: Secondary | ICD-10-CM | POA: Diagnosis not present

## 2019-07-27 DIAGNOSIS — M25541 Pain in joints of right hand: Secondary | ICD-10-CM | POA: Diagnosis not present

## 2019-07-27 DIAGNOSIS — M25542 Pain in joints of left hand: Secondary | ICD-10-CM | POA: Diagnosis not present

## 2019-07-28 DIAGNOSIS — G301 Alzheimer's disease with late onset: Secondary | ICD-10-CM | POA: Diagnosis not present

## 2019-07-28 DIAGNOSIS — M25541 Pain in joints of right hand: Secondary | ICD-10-CM | POA: Diagnosis not present

## 2019-07-28 DIAGNOSIS — M25562 Pain in left knee: Secondary | ICD-10-CM | POA: Diagnosis not present

## 2019-07-28 DIAGNOSIS — R2689 Other abnormalities of gait and mobility: Secondary | ICD-10-CM | POA: Diagnosis not present

## 2019-07-28 DIAGNOSIS — M25542 Pain in joints of left hand: Secondary | ICD-10-CM | POA: Diagnosis not present

## 2019-07-28 DIAGNOSIS — M6281 Muscle weakness (generalized): Secondary | ICD-10-CM | POA: Diagnosis not present

## 2019-07-28 DIAGNOSIS — I10 Essential (primary) hypertension: Secondary | ICD-10-CM | POA: Diagnosis not present

## 2019-07-29 DIAGNOSIS — I10 Essential (primary) hypertension: Secondary | ICD-10-CM | POA: Diagnosis not present

## 2019-07-29 DIAGNOSIS — R2689 Other abnormalities of gait and mobility: Secondary | ICD-10-CM | POA: Diagnosis not present

## 2019-07-29 DIAGNOSIS — M6281 Muscle weakness (generalized): Secondary | ICD-10-CM | POA: Diagnosis not present

## 2019-07-29 DIAGNOSIS — G301 Alzheimer's disease with late onset: Secondary | ICD-10-CM | POA: Diagnosis not present

## 2019-07-29 DIAGNOSIS — M25541 Pain in joints of right hand: Secondary | ICD-10-CM | POA: Diagnosis not present

## 2019-07-29 DIAGNOSIS — M25542 Pain in joints of left hand: Secondary | ICD-10-CM | POA: Diagnosis not present

## 2019-07-29 DIAGNOSIS — M25562 Pain in left knee: Secondary | ICD-10-CM | POA: Diagnosis not present

## 2019-08-01 DIAGNOSIS — M25542 Pain in joints of left hand: Secondary | ICD-10-CM | POA: Diagnosis not present

## 2019-08-01 DIAGNOSIS — G301 Alzheimer's disease with late onset: Secondary | ICD-10-CM | POA: Diagnosis not present

## 2019-08-01 DIAGNOSIS — M25562 Pain in left knee: Secondary | ICD-10-CM | POA: Diagnosis not present

## 2019-08-01 DIAGNOSIS — M6281 Muscle weakness (generalized): Secondary | ICD-10-CM | POA: Diagnosis not present

## 2019-08-01 DIAGNOSIS — R2689 Other abnormalities of gait and mobility: Secondary | ICD-10-CM | POA: Diagnosis not present

## 2019-08-01 DIAGNOSIS — I10 Essential (primary) hypertension: Secondary | ICD-10-CM | POA: Diagnosis not present

## 2019-08-01 DIAGNOSIS — M25541 Pain in joints of right hand: Secondary | ICD-10-CM | POA: Diagnosis not present

## 2019-08-02 DIAGNOSIS — G301 Alzheimer's disease with late onset: Secondary | ICD-10-CM | POA: Diagnosis not present

## 2019-08-02 DIAGNOSIS — M25562 Pain in left knee: Secondary | ICD-10-CM | POA: Diagnosis not present

## 2019-08-02 DIAGNOSIS — M25541 Pain in joints of right hand: Secondary | ICD-10-CM | POA: Diagnosis not present

## 2019-08-02 DIAGNOSIS — M6281 Muscle weakness (generalized): Secondary | ICD-10-CM | POA: Diagnosis not present

## 2019-08-02 DIAGNOSIS — M25542 Pain in joints of left hand: Secondary | ICD-10-CM | POA: Diagnosis not present

## 2019-08-02 DIAGNOSIS — R2689 Other abnormalities of gait and mobility: Secondary | ICD-10-CM | POA: Diagnosis not present

## 2019-08-02 DIAGNOSIS — I10 Essential (primary) hypertension: Secondary | ICD-10-CM | POA: Diagnosis not present

## 2019-08-03 DIAGNOSIS — M25541 Pain in joints of right hand: Secondary | ICD-10-CM | POA: Diagnosis not present

## 2019-08-03 DIAGNOSIS — G301 Alzheimer's disease with late onset: Secondary | ICD-10-CM | POA: Diagnosis not present

## 2019-08-03 DIAGNOSIS — R2689 Other abnormalities of gait and mobility: Secondary | ICD-10-CM | POA: Diagnosis not present

## 2019-08-03 DIAGNOSIS — I10 Essential (primary) hypertension: Secondary | ICD-10-CM | POA: Diagnosis not present

## 2019-08-03 DIAGNOSIS — M25562 Pain in left knee: Secondary | ICD-10-CM | POA: Diagnosis not present

## 2019-08-03 DIAGNOSIS — M6281 Muscle weakness (generalized): Secondary | ICD-10-CM | POA: Diagnosis not present

## 2019-08-03 DIAGNOSIS — M25542 Pain in joints of left hand: Secondary | ICD-10-CM | POA: Diagnosis not present

## 2019-08-04 DIAGNOSIS — I10 Essential (primary) hypertension: Secondary | ICD-10-CM | POA: Diagnosis not present

## 2019-08-04 DIAGNOSIS — D638 Anemia in other chronic diseases classified elsewhere: Secondary | ICD-10-CM | POA: Diagnosis not present

## 2019-08-04 DIAGNOSIS — M25542 Pain in joints of left hand: Secondary | ICD-10-CM | POA: Diagnosis not present

## 2019-08-04 DIAGNOSIS — R5381 Other malaise: Secondary | ICD-10-CM | POA: Diagnosis not present

## 2019-08-04 DIAGNOSIS — M25562 Pain in left knee: Secondary | ICD-10-CM | POA: Diagnosis not present

## 2019-08-04 DIAGNOSIS — M25541 Pain in joints of right hand: Secondary | ICD-10-CM | POA: Diagnosis not present

## 2019-08-04 DIAGNOSIS — M6281 Muscle weakness (generalized): Secondary | ICD-10-CM | POA: Diagnosis not present

## 2019-08-04 DIAGNOSIS — R2689 Other abnormalities of gait and mobility: Secondary | ICD-10-CM | POA: Diagnosis not present

## 2019-08-04 DIAGNOSIS — G301 Alzheimer's disease with late onset: Secondary | ICD-10-CM | POA: Diagnosis not present

## 2019-08-04 DIAGNOSIS — M8949 Other hypertrophic osteoarthropathy, multiple sites: Secondary | ICD-10-CM | POA: Diagnosis not present

## 2019-08-05 DIAGNOSIS — M25542 Pain in joints of left hand: Secondary | ICD-10-CM | POA: Diagnosis not present

## 2019-08-05 DIAGNOSIS — M6281 Muscle weakness (generalized): Secondary | ICD-10-CM | POA: Diagnosis not present

## 2019-08-05 DIAGNOSIS — I10 Essential (primary) hypertension: Secondary | ICD-10-CM | POA: Diagnosis not present

## 2019-08-05 DIAGNOSIS — M25541 Pain in joints of right hand: Secondary | ICD-10-CM | POA: Diagnosis not present

## 2019-08-05 DIAGNOSIS — G301 Alzheimer's disease with late onset: Secondary | ICD-10-CM | POA: Diagnosis not present

## 2019-08-05 DIAGNOSIS — M25562 Pain in left knee: Secondary | ICD-10-CM | POA: Diagnosis not present

## 2019-08-05 DIAGNOSIS — R2689 Other abnormalities of gait and mobility: Secondary | ICD-10-CM | POA: Diagnosis not present

## 2019-08-06 DIAGNOSIS — M25542 Pain in joints of left hand: Secondary | ICD-10-CM | POA: Diagnosis not present

## 2019-08-06 DIAGNOSIS — I10 Essential (primary) hypertension: Secondary | ICD-10-CM | POA: Diagnosis not present

## 2019-08-06 DIAGNOSIS — R2689 Other abnormalities of gait and mobility: Secondary | ICD-10-CM | POA: Diagnosis not present

## 2019-08-06 DIAGNOSIS — M25562 Pain in left knee: Secondary | ICD-10-CM | POA: Diagnosis not present

## 2019-08-06 DIAGNOSIS — M25541 Pain in joints of right hand: Secondary | ICD-10-CM | POA: Diagnosis not present

## 2019-08-06 DIAGNOSIS — M6281 Muscle weakness (generalized): Secondary | ICD-10-CM | POA: Diagnosis not present

## 2019-08-06 DIAGNOSIS — G301 Alzheimer's disease with late onset: Secondary | ICD-10-CM | POA: Diagnosis not present

## 2019-08-08 DIAGNOSIS — M6281 Muscle weakness (generalized): Secondary | ICD-10-CM | POA: Diagnosis not present

## 2019-08-08 DIAGNOSIS — G301 Alzheimer's disease with late onset: Secondary | ICD-10-CM | POA: Diagnosis not present

## 2019-08-08 DIAGNOSIS — F5101 Primary insomnia: Secondary | ICD-10-CM | POA: Diagnosis not present

## 2019-08-08 DIAGNOSIS — M25562 Pain in left knee: Secondary | ICD-10-CM | POA: Diagnosis not present

## 2019-08-08 DIAGNOSIS — I10 Essential (primary) hypertension: Secondary | ICD-10-CM | POA: Diagnosis not present

## 2019-08-08 DIAGNOSIS — M25541 Pain in joints of right hand: Secondary | ICD-10-CM | POA: Diagnosis not present

## 2019-08-08 DIAGNOSIS — R2689 Other abnormalities of gait and mobility: Secondary | ICD-10-CM | POA: Diagnosis not present

## 2019-08-08 DIAGNOSIS — F0280 Dementia in other diseases classified elsewhere without behavioral disturbance: Secondary | ICD-10-CM | POA: Diagnosis not present

## 2019-08-08 DIAGNOSIS — M25542 Pain in joints of left hand: Secondary | ICD-10-CM | POA: Diagnosis not present

## 2019-08-09 DIAGNOSIS — M25541 Pain in joints of right hand: Secondary | ICD-10-CM | POA: Diagnosis not present

## 2019-08-09 DIAGNOSIS — M25542 Pain in joints of left hand: Secondary | ICD-10-CM | POA: Diagnosis not present

## 2019-08-09 DIAGNOSIS — R2689 Other abnormalities of gait and mobility: Secondary | ICD-10-CM | POA: Diagnosis not present

## 2019-08-09 DIAGNOSIS — I10 Essential (primary) hypertension: Secondary | ICD-10-CM | POA: Diagnosis not present

## 2019-08-09 DIAGNOSIS — G301 Alzheimer's disease with late onset: Secondary | ICD-10-CM | POA: Diagnosis not present

## 2019-08-09 DIAGNOSIS — M25562 Pain in left knee: Secondary | ICD-10-CM | POA: Diagnosis not present

## 2019-08-09 DIAGNOSIS — M6281 Muscle weakness (generalized): Secondary | ICD-10-CM | POA: Diagnosis not present

## 2019-08-10 DIAGNOSIS — M6281 Muscle weakness (generalized): Secondary | ICD-10-CM | POA: Diagnosis not present

## 2019-08-10 DIAGNOSIS — M25562 Pain in left knee: Secondary | ICD-10-CM | POA: Diagnosis not present

## 2019-08-10 DIAGNOSIS — I10 Essential (primary) hypertension: Secondary | ICD-10-CM | POA: Diagnosis not present

## 2019-08-10 DIAGNOSIS — M25541 Pain in joints of right hand: Secondary | ICD-10-CM | POA: Diagnosis not present

## 2019-08-10 DIAGNOSIS — G301 Alzheimer's disease with late onset: Secondary | ICD-10-CM | POA: Diagnosis not present

## 2019-08-10 DIAGNOSIS — M25542 Pain in joints of left hand: Secondary | ICD-10-CM | POA: Diagnosis not present

## 2019-08-10 DIAGNOSIS — R2689 Other abnormalities of gait and mobility: Secondary | ICD-10-CM | POA: Diagnosis not present

## 2019-08-11 DIAGNOSIS — M25541 Pain in joints of right hand: Secondary | ICD-10-CM | POA: Diagnosis not present

## 2019-08-11 DIAGNOSIS — M6281 Muscle weakness (generalized): Secondary | ICD-10-CM | POA: Diagnosis not present

## 2019-08-11 DIAGNOSIS — M25542 Pain in joints of left hand: Secondary | ICD-10-CM | POA: Diagnosis not present

## 2019-08-11 DIAGNOSIS — I10 Essential (primary) hypertension: Secondary | ICD-10-CM | POA: Diagnosis not present

## 2019-08-11 DIAGNOSIS — M25562 Pain in left knee: Secondary | ICD-10-CM | POA: Diagnosis not present

## 2019-08-11 DIAGNOSIS — G301 Alzheimer's disease with late onset: Secondary | ICD-10-CM | POA: Diagnosis not present

## 2019-08-11 DIAGNOSIS — R2689 Other abnormalities of gait and mobility: Secondary | ICD-10-CM | POA: Diagnosis not present

## 2019-08-12 DIAGNOSIS — I10 Essential (primary) hypertension: Secondary | ICD-10-CM | POA: Diagnosis not present

## 2019-08-12 DIAGNOSIS — M25542 Pain in joints of left hand: Secondary | ICD-10-CM | POA: Diagnosis not present

## 2019-08-12 DIAGNOSIS — R2689 Other abnormalities of gait and mobility: Secondary | ICD-10-CM | POA: Diagnosis not present

## 2019-08-12 DIAGNOSIS — D638 Anemia in other chronic diseases classified elsewhere: Secondary | ICD-10-CM | POA: Diagnosis not present

## 2019-08-12 DIAGNOSIS — M25541 Pain in joints of right hand: Secondary | ICD-10-CM | POA: Diagnosis not present

## 2019-08-12 DIAGNOSIS — M25562 Pain in left knee: Secondary | ICD-10-CM | POA: Diagnosis not present

## 2019-08-12 DIAGNOSIS — E039 Hypothyroidism, unspecified: Secondary | ICD-10-CM | POA: Diagnosis not present

## 2019-08-12 DIAGNOSIS — M6281 Muscle weakness (generalized): Secondary | ICD-10-CM | POA: Diagnosis not present

## 2019-08-12 DIAGNOSIS — R5381 Other malaise: Secondary | ICD-10-CM | POA: Diagnosis not present

## 2019-08-12 DIAGNOSIS — G301 Alzheimer's disease with late onset: Secondary | ICD-10-CM | POA: Diagnosis not present

## 2019-08-15 DIAGNOSIS — M6281 Muscle weakness (generalized): Secondary | ICD-10-CM | POA: Diagnosis not present

## 2019-08-15 DIAGNOSIS — G301 Alzheimer's disease with late onset: Secondary | ICD-10-CM | POA: Diagnosis not present

## 2019-08-15 DIAGNOSIS — M25541 Pain in joints of right hand: Secondary | ICD-10-CM | POA: Diagnosis not present

## 2019-08-15 DIAGNOSIS — R2689 Other abnormalities of gait and mobility: Secondary | ICD-10-CM | POA: Diagnosis not present

## 2019-08-15 DIAGNOSIS — M25562 Pain in left knee: Secondary | ICD-10-CM | POA: Diagnosis not present

## 2019-08-15 DIAGNOSIS — M25542 Pain in joints of left hand: Secondary | ICD-10-CM | POA: Diagnosis not present

## 2019-08-15 DIAGNOSIS — I10 Essential (primary) hypertension: Secondary | ICD-10-CM | POA: Diagnosis not present

## 2019-08-16 DIAGNOSIS — M25562 Pain in left knee: Secondary | ICD-10-CM | POA: Diagnosis not present

## 2019-08-16 DIAGNOSIS — R2689 Other abnormalities of gait and mobility: Secondary | ICD-10-CM | POA: Diagnosis not present

## 2019-08-16 DIAGNOSIS — I10 Essential (primary) hypertension: Secondary | ICD-10-CM | POA: Diagnosis not present

## 2019-08-16 DIAGNOSIS — M25541 Pain in joints of right hand: Secondary | ICD-10-CM | POA: Diagnosis not present

## 2019-08-16 DIAGNOSIS — G301 Alzheimer's disease with late onset: Secondary | ICD-10-CM | POA: Diagnosis not present

## 2019-08-16 DIAGNOSIS — M6281 Muscle weakness (generalized): Secondary | ICD-10-CM | POA: Diagnosis not present

## 2019-08-16 DIAGNOSIS — M25542 Pain in joints of left hand: Secondary | ICD-10-CM | POA: Diagnosis not present

## 2019-08-17 DIAGNOSIS — M6281 Muscle weakness (generalized): Secondary | ICD-10-CM | POA: Diagnosis not present

## 2019-08-17 DIAGNOSIS — R2689 Other abnormalities of gait and mobility: Secondary | ICD-10-CM | POA: Diagnosis not present

## 2019-08-17 DIAGNOSIS — I10 Essential (primary) hypertension: Secondary | ICD-10-CM | POA: Diagnosis not present

## 2019-08-17 DIAGNOSIS — M25542 Pain in joints of left hand: Secondary | ICD-10-CM | POA: Diagnosis not present

## 2019-08-17 DIAGNOSIS — M25541 Pain in joints of right hand: Secondary | ICD-10-CM | POA: Diagnosis not present

## 2019-08-17 DIAGNOSIS — M25562 Pain in left knee: Secondary | ICD-10-CM | POA: Diagnosis not present

## 2019-08-17 DIAGNOSIS — G301 Alzheimer's disease with late onset: Secondary | ICD-10-CM | POA: Diagnosis not present

## 2019-08-18 DIAGNOSIS — M25562 Pain in left knee: Secondary | ICD-10-CM | POA: Diagnosis not present

## 2019-08-18 DIAGNOSIS — M25542 Pain in joints of left hand: Secondary | ICD-10-CM | POA: Diagnosis not present

## 2019-08-18 DIAGNOSIS — R2689 Other abnormalities of gait and mobility: Secondary | ICD-10-CM | POA: Diagnosis not present

## 2019-08-18 DIAGNOSIS — M25541 Pain in joints of right hand: Secondary | ICD-10-CM | POA: Diagnosis not present

## 2019-08-18 DIAGNOSIS — G301 Alzheimer's disease with late onset: Secondary | ICD-10-CM | POA: Diagnosis not present

## 2019-08-18 DIAGNOSIS — I10 Essential (primary) hypertension: Secondary | ICD-10-CM | POA: Diagnosis not present

## 2019-08-18 DIAGNOSIS — M6281 Muscle weakness (generalized): Secondary | ICD-10-CM | POA: Diagnosis not present

## 2019-08-19 DIAGNOSIS — G301 Alzheimer's disease with late onset: Secondary | ICD-10-CM | POA: Diagnosis not present

## 2019-08-19 DIAGNOSIS — M25562 Pain in left knee: Secondary | ICD-10-CM | POA: Diagnosis not present

## 2019-08-19 DIAGNOSIS — R2689 Other abnormalities of gait and mobility: Secondary | ICD-10-CM | POA: Diagnosis not present

## 2019-08-19 DIAGNOSIS — M6281 Muscle weakness (generalized): Secondary | ICD-10-CM | POA: Diagnosis not present

## 2019-08-19 DIAGNOSIS — M25541 Pain in joints of right hand: Secondary | ICD-10-CM | POA: Diagnosis not present

## 2019-08-19 DIAGNOSIS — I10 Essential (primary) hypertension: Secondary | ICD-10-CM | POA: Diagnosis not present

## 2019-08-19 DIAGNOSIS — M25542 Pain in joints of left hand: Secondary | ICD-10-CM | POA: Diagnosis not present

## 2019-08-22 DIAGNOSIS — M25562 Pain in left knee: Secondary | ICD-10-CM | POA: Diagnosis not present

## 2019-08-22 DIAGNOSIS — M25542 Pain in joints of left hand: Secondary | ICD-10-CM | POA: Diagnosis not present

## 2019-08-22 DIAGNOSIS — R2689 Other abnormalities of gait and mobility: Secondary | ICD-10-CM | POA: Diagnosis not present

## 2019-08-22 DIAGNOSIS — M25541 Pain in joints of right hand: Secondary | ICD-10-CM | POA: Diagnosis not present

## 2019-08-22 DIAGNOSIS — G301 Alzheimer's disease with late onset: Secondary | ICD-10-CM | POA: Diagnosis not present

## 2019-08-22 DIAGNOSIS — I10 Essential (primary) hypertension: Secondary | ICD-10-CM | POA: Diagnosis not present

## 2019-08-22 DIAGNOSIS — M6281 Muscle weakness (generalized): Secondary | ICD-10-CM | POA: Diagnosis not present

## 2019-08-23 DIAGNOSIS — M25542 Pain in joints of left hand: Secondary | ICD-10-CM | POA: Diagnosis not present

## 2019-08-23 DIAGNOSIS — M25562 Pain in left knee: Secondary | ICD-10-CM | POA: Diagnosis not present

## 2019-08-23 DIAGNOSIS — I10 Essential (primary) hypertension: Secondary | ICD-10-CM | POA: Diagnosis not present

## 2019-08-23 DIAGNOSIS — M25541 Pain in joints of right hand: Secondary | ICD-10-CM | POA: Diagnosis not present

## 2019-08-23 DIAGNOSIS — M6281 Muscle weakness (generalized): Secondary | ICD-10-CM | POA: Diagnosis not present

## 2019-08-23 DIAGNOSIS — G301 Alzheimer's disease with late onset: Secondary | ICD-10-CM | POA: Diagnosis not present

## 2019-08-23 DIAGNOSIS — R2689 Other abnormalities of gait and mobility: Secondary | ICD-10-CM | POA: Diagnosis not present

## 2019-08-24 DIAGNOSIS — M25562 Pain in left knee: Secondary | ICD-10-CM | POA: Diagnosis not present

## 2019-08-24 DIAGNOSIS — M25542 Pain in joints of left hand: Secondary | ICD-10-CM | POA: Diagnosis not present

## 2019-08-24 DIAGNOSIS — R2689 Other abnormalities of gait and mobility: Secondary | ICD-10-CM | POA: Diagnosis not present

## 2019-08-24 DIAGNOSIS — G301 Alzheimer's disease with late onset: Secondary | ICD-10-CM | POA: Diagnosis not present

## 2019-08-24 DIAGNOSIS — M25541 Pain in joints of right hand: Secondary | ICD-10-CM | POA: Diagnosis not present

## 2019-08-24 DIAGNOSIS — M6281 Muscle weakness (generalized): Secondary | ICD-10-CM | POA: Diagnosis not present

## 2019-08-24 DIAGNOSIS — I10 Essential (primary) hypertension: Secondary | ICD-10-CM | POA: Diagnosis not present

## 2019-08-25 DIAGNOSIS — M25562 Pain in left knee: Secondary | ICD-10-CM | POA: Diagnosis not present

## 2019-08-25 DIAGNOSIS — I10 Essential (primary) hypertension: Secondary | ICD-10-CM | POA: Diagnosis not present

## 2019-08-25 DIAGNOSIS — R2689 Other abnormalities of gait and mobility: Secondary | ICD-10-CM | POA: Diagnosis not present

## 2019-08-25 DIAGNOSIS — M6281 Muscle weakness (generalized): Secondary | ICD-10-CM | POA: Diagnosis not present

## 2019-08-25 DIAGNOSIS — G301 Alzheimer's disease with late onset: Secondary | ICD-10-CM | POA: Diagnosis not present

## 2019-08-25 DIAGNOSIS — M25542 Pain in joints of left hand: Secondary | ICD-10-CM | POA: Diagnosis not present

## 2019-08-25 DIAGNOSIS — M25541 Pain in joints of right hand: Secondary | ICD-10-CM | POA: Diagnosis not present

## 2019-08-26 DIAGNOSIS — R2689 Other abnormalities of gait and mobility: Secondary | ICD-10-CM | POA: Diagnosis not present

## 2019-08-26 DIAGNOSIS — M25541 Pain in joints of right hand: Secondary | ICD-10-CM | POA: Diagnosis not present

## 2019-08-26 DIAGNOSIS — M25542 Pain in joints of left hand: Secondary | ICD-10-CM | POA: Diagnosis not present

## 2019-08-26 DIAGNOSIS — M6281 Muscle weakness (generalized): Secondary | ICD-10-CM | POA: Diagnosis not present

## 2019-08-26 DIAGNOSIS — I10 Essential (primary) hypertension: Secondary | ICD-10-CM | POA: Diagnosis not present

## 2019-08-26 DIAGNOSIS — G301 Alzheimer's disease with late onset: Secondary | ICD-10-CM | POA: Diagnosis not present

## 2019-08-26 DIAGNOSIS — M25562 Pain in left knee: Secondary | ICD-10-CM | POA: Diagnosis not present

## 2019-08-29 DIAGNOSIS — F5101 Primary insomnia: Secondary | ICD-10-CM | POA: Diagnosis not present

## 2019-08-29 DIAGNOSIS — F0280 Dementia in other diseases classified elsewhere without behavioral disturbance: Secondary | ICD-10-CM | POA: Diagnosis not present

## 2019-08-29 DIAGNOSIS — G301 Alzheimer's disease with late onset: Secondary | ICD-10-CM | POA: Diagnosis not present

## 2019-08-29 DIAGNOSIS — M25541 Pain in joints of right hand: Secondary | ICD-10-CM | POA: Diagnosis not present

## 2019-08-29 DIAGNOSIS — M25562 Pain in left knee: Secondary | ICD-10-CM | POA: Diagnosis not present

## 2019-08-29 DIAGNOSIS — I10 Essential (primary) hypertension: Secondary | ICD-10-CM | POA: Diagnosis not present

## 2019-08-29 DIAGNOSIS — R2689 Other abnormalities of gait and mobility: Secondary | ICD-10-CM | POA: Diagnosis not present

## 2019-08-29 DIAGNOSIS — M25542 Pain in joints of left hand: Secondary | ICD-10-CM | POA: Diagnosis not present

## 2019-08-29 DIAGNOSIS — M6281 Muscle weakness (generalized): Secondary | ICD-10-CM | POA: Diagnosis not present

## 2019-08-30 DIAGNOSIS — I10 Essential (primary) hypertension: Secondary | ICD-10-CM | POA: Diagnosis not present

## 2019-08-30 DIAGNOSIS — M6281 Muscle weakness (generalized): Secondary | ICD-10-CM | POA: Diagnosis not present

## 2019-08-30 DIAGNOSIS — R2689 Other abnormalities of gait and mobility: Secondary | ICD-10-CM | POA: Diagnosis not present

## 2019-08-30 DIAGNOSIS — M25542 Pain in joints of left hand: Secondary | ICD-10-CM | POA: Diagnosis not present

## 2019-08-30 DIAGNOSIS — M25562 Pain in left knee: Secondary | ICD-10-CM | POA: Diagnosis not present

## 2019-08-30 DIAGNOSIS — G301 Alzheimer's disease with late onset: Secondary | ICD-10-CM | POA: Diagnosis not present

## 2019-08-30 DIAGNOSIS — M25541 Pain in joints of right hand: Secondary | ICD-10-CM | POA: Diagnosis not present

## 2019-08-31 DIAGNOSIS — M25562 Pain in left knee: Secondary | ICD-10-CM | POA: Diagnosis not present

## 2019-08-31 DIAGNOSIS — I10 Essential (primary) hypertension: Secondary | ICD-10-CM | POA: Diagnosis not present

## 2019-08-31 DIAGNOSIS — M25542 Pain in joints of left hand: Secondary | ICD-10-CM | POA: Diagnosis not present

## 2019-08-31 DIAGNOSIS — M25541 Pain in joints of right hand: Secondary | ICD-10-CM | POA: Diagnosis not present

## 2019-08-31 DIAGNOSIS — M6281 Muscle weakness (generalized): Secondary | ICD-10-CM | POA: Diagnosis not present

## 2019-08-31 DIAGNOSIS — G301 Alzheimer's disease with late onset: Secondary | ICD-10-CM | POA: Diagnosis not present

## 2019-08-31 DIAGNOSIS — R2689 Other abnormalities of gait and mobility: Secondary | ICD-10-CM | POA: Diagnosis not present

## 2019-09-01 DIAGNOSIS — M25541 Pain in joints of right hand: Secondary | ICD-10-CM | POA: Diagnosis not present

## 2019-09-01 DIAGNOSIS — I10 Essential (primary) hypertension: Secondary | ICD-10-CM | POA: Diagnosis not present

## 2019-09-01 DIAGNOSIS — M25562 Pain in left knee: Secondary | ICD-10-CM | POA: Diagnosis not present

## 2019-09-01 DIAGNOSIS — M25542 Pain in joints of left hand: Secondary | ICD-10-CM | POA: Diagnosis not present

## 2019-09-01 DIAGNOSIS — G301 Alzheimer's disease with late onset: Secondary | ICD-10-CM | POA: Diagnosis not present

## 2019-09-01 DIAGNOSIS — R2689 Other abnormalities of gait and mobility: Secondary | ICD-10-CM | POA: Diagnosis not present

## 2019-09-01 DIAGNOSIS — M6281 Muscle weakness (generalized): Secondary | ICD-10-CM | POA: Diagnosis not present

## 2019-09-02 DIAGNOSIS — G301 Alzheimer's disease with late onset: Secondary | ICD-10-CM | POA: Diagnosis not present

## 2019-09-02 DIAGNOSIS — M6281 Muscle weakness (generalized): Secondary | ICD-10-CM | POA: Diagnosis not present

## 2019-09-02 DIAGNOSIS — M25542 Pain in joints of left hand: Secondary | ICD-10-CM | POA: Diagnosis not present

## 2019-09-02 DIAGNOSIS — I10 Essential (primary) hypertension: Secondary | ICD-10-CM | POA: Diagnosis not present

## 2019-09-02 DIAGNOSIS — R2689 Other abnormalities of gait and mobility: Secondary | ICD-10-CM | POA: Diagnosis not present

## 2019-09-02 DIAGNOSIS — M25541 Pain in joints of right hand: Secondary | ICD-10-CM | POA: Diagnosis not present

## 2019-09-02 DIAGNOSIS — M25562 Pain in left knee: Secondary | ICD-10-CM | POA: Diagnosis not present

## 2019-09-05 DIAGNOSIS — M25562 Pain in left knee: Secondary | ICD-10-CM | POA: Diagnosis not present

## 2019-09-05 DIAGNOSIS — I10 Essential (primary) hypertension: Secondary | ICD-10-CM | POA: Diagnosis not present

## 2019-09-05 DIAGNOSIS — M6281 Muscle weakness (generalized): Secondary | ICD-10-CM | POA: Diagnosis not present

## 2019-09-05 DIAGNOSIS — M25542 Pain in joints of left hand: Secondary | ICD-10-CM | POA: Diagnosis not present

## 2019-09-05 DIAGNOSIS — R2689 Other abnormalities of gait and mobility: Secondary | ICD-10-CM | POA: Diagnosis not present

## 2019-09-05 DIAGNOSIS — M25541 Pain in joints of right hand: Secondary | ICD-10-CM | POA: Diagnosis not present

## 2019-09-05 DIAGNOSIS — G301 Alzheimer's disease with late onset: Secondary | ICD-10-CM | POA: Diagnosis not present

## 2019-09-06 DIAGNOSIS — M25562 Pain in left knee: Secondary | ICD-10-CM | POA: Diagnosis not present

## 2019-09-06 DIAGNOSIS — M25541 Pain in joints of right hand: Secondary | ICD-10-CM | POA: Diagnosis not present

## 2019-09-06 DIAGNOSIS — M6281 Muscle weakness (generalized): Secondary | ICD-10-CM | POA: Diagnosis not present

## 2019-09-06 DIAGNOSIS — M25542 Pain in joints of left hand: Secondary | ICD-10-CM | POA: Diagnosis not present

## 2019-09-06 DIAGNOSIS — R2689 Other abnormalities of gait and mobility: Secondary | ICD-10-CM | POA: Diagnosis not present

## 2019-09-06 DIAGNOSIS — G301 Alzheimer's disease with late onset: Secondary | ICD-10-CM | POA: Diagnosis not present

## 2019-09-06 DIAGNOSIS — I10 Essential (primary) hypertension: Secondary | ICD-10-CM | POA: Diagnosis not present

## 2019-09-07 DIAGNOSIS — M25541 Pain in joints of right hand: Secondary | ICD-10-CM | POA: Diagnosis not present

## 2019-09-07 DIAGNOSIS — I10 Essential (primary) hypertension: Secondary | ICD-10-CM | POA: Diagnosis not present

## 2019-09-07 DIAGNOSIS — M25562 Pain in left knee: Secondary | ICD-10-CM | POA: Diagnosis not present

## 2019-09-07 DIAGNOSIS — M6281 Muscle weakness (generalized): Secondary | ICD-10-CM | POA: Diagnosis not present

## 2019-09-07 DIAGNOSIS — R2689 Other abnormalities of gait and mobility: Secondary | ICD-10-CM | POA: Diagnosis not present

## 2019-09-07 DIAGNOSIS — G301 Alzheimer's disease with late onset: Secondary | ICD-10-CM | POA: Diagnosis not present

## 2019-09-07 DIAGNOSIS — M25542 Pain in joints of left hand: Secondary | ICD-10-CM | POA: Diagnosis not present

## 2019-09-08 DIAGNOSIS — M25562 Pain in left knee: Secondary | ICD-10-CM | POA: Diagnosis not present

## 2019-09-08 DIAGNOSIS — R2689 Other abnormalities of gait and mobility: Secondary | ICD-10-CM | POA: Diagnosis not present

## 2019-09-08 DIAGNOSIS — I10 Essential (primary) hypertension: Secondary | ICD-10-CM | POA: Diagnosis not present

## 2019-09-08 DIAGNOSIS — G301 Alzheimer's disease with late onset: Secondary | ICD-10-CM | POA: Diagnosis not present

## 2019-09-08 DIAGNOSIS — M25541 Pain in joints of right hand: Secondary | ICD-10-CM | POA: Diagnosis not present

## 2019-09-08 DIAGNOSIS — M25542 Pain in joints of left hand: Secondary | ICD-10-CM | POA: Diagnosis not present

## 2019-09-08 DIAGNOSIS — M6281 Muscle weakness (generalized): Secondary | ICD-10-CM | POA: Diagnosis not present

## 2019-09-09 DIAGNOSIS — M25562 Pain in left knee: Secondary | ICD-10-CM | POA: Diagnosis not present

## 2019-09-09 DIAGNOSIS — M25542 Pain in joints of left hand: Secondary | ICD-10-CM | POA: Diagnosis not present

## 2019-09-09 DIAGNOSIS — I10 Essential (primary) hypertension: Secondary | ICD-10-CM | POA: Diagnosis not present

## 2019-09-09 DIAGNOSIS — R2689 Other abnormalities of gait and mobility: Secondary | ICD-10-CM | POA: Diagnosis not present

## 2019-09-09 DIAGNOSIS — M25541 Pain in joints of right hand: Secondary | ICD-10-CM | POA: Diagnosis not present

## 2019-09-09 DIAGNOSIS — M6281 Muscle weakness (generalized): Secondary | ICD-10-CM | POA: Diagnosis not present

## 2019-09-09 DIAGNOSIS — G301 Alzheimer's disease with late onset: Secondary | ICD-10-CM | POA: Diagnosis not present

## 2019-09-12 DIAGNOSIS — M25562 Pain in left knee: Secondary | ICD-10-CM | POA: Diagnosis not present

## 2019-09-12 DIAGNOSIS — R5381 Other malaise: Secondary | ICD-10-CM | POA: Diagnosis not present

## 2019-09-12 DIAGNOSIS — M6281 Muscle weakness (generalized): Secondary | ICD-10-CM | POA: Diagnosis not present

## 2019-09-12 DIAGNOSIS — R2689 Other abnormalities of gait and mobility: Secondary | ICD-10-CM | POA: Diagnosis not present

## 2019-09-12 DIAGNOSIS — D638 Anemia in other chronic diseases classified elsewhere: Secondary | ICD-10-CM | POA: Diagnosis not present

## 2019-09-12 DIAGNOSIS — E039 Hypothyroidism, unspecified: Secondary | ICD-10-CM | POA: Diagnosis not present

## 2019-09-12 DIAGNOSIS — M25542 Pain in joints of left hand: Secondary | ICD-10-CM | POA: Diagnosis not present

## 2019-09-12 DIAGNOSIS — M25541 Pain in joints of right hand: Secondary | ICD-10-CM | POA: Diagnosis not present

## 2019-09-12 DIAGNOSIS — I10 Essential (primary) hypertension: Secondary | ICD-10-CM | POA: Diagnosis not present

## 2019-09-12 DIAGNOSIS — G301 Alzheimer's disease with late onset: Secondary | ICD-10-CM | POA: Diagnosis not present

## 2019-09-13 DIAGNOSIS — R2689 Other abnormalities of gait and mobility: Secondary | ICD-10-CM | POA: Diagnosis not present

## 2019-09-13 DIAGNOSIS — I10 Essential (primary) hypertension: Secondary | ICD-10-CM | POA: Diagnosis not present

## 2019-09-13 DIAGNOSIS — M25542 Pain in joints of left hand: Secondary | ICD-10-CM | POA: Diagnosis not present

## 2019-09-13 DIAGNOSIS — M25562 Pain in left knee: Secondary | ICD-10-CM | POA: Diagnosis not present

## 2019-09-13 DIAGNOSIS — M6281 Muscle weakness (generalized): Secondary | ICD-10-CM | POA: Diagnosis not present

## 2019-09-13 DIAGNOSIS — G301 Alzheimer's disease with late onset: Secondary | ICD-10-CM | POA: Diagnosis not present

## 2019-09-13 DIAGNOSIS — M25541 Pain in joints of right hand: Secondary | ICD-10-CM | POA: Diagnosis not present

## 2019-09-14 DIAGNOSIS — M25542 Pain in joints of left hand: Secondary | ICD-10-CM | POA: Diagnosis not present

## 2019-09-14 DIAGNOSIS — M6281 Muscle weakness (generalized): Secondary | ICD-10-CM | POA: Diagnosis not present

## 2019-09-14 DIAGNOSIS — I10 Essential (primary) hypertension: Secondary | ICD-10-CM | POA: Diagnosis not present

## 2019-09-14 DIAGNOSIS — G301 Alzheimer's disease with late onset: Secondary | ICD-10-CM | POA: Diagnosis not present

## 2019-09-14 DIAGNOSIS — R2689 Other abnormalities of gait and mobility: Secondary | ICD-10-CM | POA: Diagnosis not present

## 2019-09-14 DIAGNOSIS — M25562 Pain in left knee: Secondary | ICD-10-CM | POA: Diagnosis not present

## 2019-09-14 DIAGNOSIS — M25541 Pain in joints of right hand: Secondary | ICD-10-CM | POA: Diagnosis not present

## 2019-09-15 DIAGNOSIS — I10 Essential (primary) hypertension: Secondary | ICD-10-CM | POA: Diagnosis not present

## 2019-09-15 DIAGNOSIS — G301 Alzheimer's disease with late onset: Secondary | ICD-10-CM | POA: Diagnosis not present

## 2019-09-15 DIAGNOSIS — M25541 Pain in joints of right hand: Secondary | ICD-10-CM | POA: Diagnosis not present

## 2019-09-15 DIAGNOSIS — M25562 Pain in left knee: Secondary | ICD-10-CM | POA: Diagnosis not present

## 2019-09-15 DIAGNOSIS — M25542 Pain in joints of left hand: Secondary | ICD-10-CM | POA: Diagnosis not present

## 2019-09-15 DIAGNOSIS — R2689 Other abnormalities of gait and mobility: Secondary | ICD-10-CM | POA: Diagnosis not present

## 2019-09-15 DIAGNOSIS — M6281 Muscle weakness (generalized): Secondary | ICD-10-CM | POA: Diagnosis not present

## 2019-09-16 DIAGNOSIS — M25562 Pain in left knee: Secondary | ICD-10-CM | POA: Diagnosis not present

## 2019-09-16 DIAGNOSIS — M6281 Muscle weakness (generalized): Secondary | ICD-10-CM | POA: Diagnosis not present

## 2019-09-16 DIAGNOSIS — M25542 Pain in joints of left hand: Secondary | ICD-10-CM | POA: Diagnosis not present

## 2019-09-16 DIAGNOSIS — R2689 Other abnormalities of gait and mobility: Secondary | ICD-10-CM | POA: Diagnosis not present

## 2019-09-16 DIAGNOSIS — M25541 Pain in joints of right hand: Secondary | ICD-10-CM | POA: Diagnosis not present

## 2019-09-16 DIAGNOSIS — G301 Alzheimer's disease with late onset: Secondary | ICD-10-CM | POA: Diagnosis not present

## 2019-09-16 DIAGNOSIS — I10 Essential (primary) hypertension: Secondary | ICD-10-CM | POA: Diagnosis not present

## 2019-09-19 DIAGNOSIS — I10 Essential (primary) hypertension: Secondary | ICD-10-CM | POA: Diagnosis not present

## 2019-09-19 DIAGNOSIS — M25562 Pain in left knee: Secondary | ICD-10-CM | POA: Diagnosis not present

## 2019-09-19 DIAGNOSIS — M25542 Pain in joints of left hand: Secondary | ICD-10-CM | POA: Diagnosis not present

## 2019-09-19 DIAGNOSIS — M25541 Pain in joints of right hand: Secondary | ICD-10-CM | POA: Diagnosis not present

## 2019-09-19 DIAGNOSIS — E039 Hypothyroidism, unspecified: Secondary | ICD-10-CM | POA: Diagnosis not present

## 2019-09-19 DIAGNOSIS — E782 Mixed hyperlipidemia: Secondary | ICD-10-CM | POA: Diagnosis not present

## 2019-09-19 DIAGNOSIS — M6281 Muscle weakness (generalized): Secondary | ICD-10-CM | POA: Diagnosis not present

## 2019-09-19 DIAGNOSIS — R2689 Other abnormalities of gait and mobility: Secondary | ICD-10-CM | POA: Diagnosis not present

## 2019-09-19 DIAGNOSIS — G301 Alzheimer's disease with late onset: Secondary | ICD-10-CM | POA: Diagnosis not present

## 2019-09-20 DIAGNOSIS — I10 Essential (primary) hypertension: Secondary | ICD-10-CM | POA: Diagnosis not present

## 2019-09-20 DIAGNOSIS — M25541 Pain in joints of right hand: Secondary | ICD-10-CM | POA: Diagnosis not present

## 2019-09-20 DIAGNOSIS — G301 Alzheimer's disease with late onset: Secondary | ICD-10-CM | POA: Diagnosis not present

## 2019-09-20 DIAGNOSIS — M25562 Pain in left knee: Secondary | ICD-10-CM | POA: Diagnosis not present

## 2019-09-20 DIAGNOSIS — M6281 Muscle weakness (generalized): Secondary | ICD-10-CM | POA: Diagnosis not present

## 2019-09-20 DIAGNOSIS — R2689 Other abnormalities of gait and mobility: Secondary | ICD-10-CM | POA: Diagnosis not present

## 2019-09-20 DIAGNOSIS — M25542 Pain in joints of left hand: Secondary | ICD-10-CM | POA: Diagnosis not present

## 2019-09-20 DIAGNOSIS — E039 Hypothyroidism, unspecified: Secondary | ICD-10-CM | POA: Diagnosis not present

## 2019-09-21 DIAGNOSIS — R2689 Other abnormalities of gait and mobility: Secondary | ICD-10-CM | POA: Diagnosis not present

## 2019-09-21 DIAGNOSIS — M25562 Pain in left knee: Secondary | ICD-10-CM | POA: Diagnosis not present

## 2019-09-21 DIAGNOSIS — M25541 Pain in joints of right hand: Secondary | ICD-10-CM | POA: Diagnosis not present

## 2019-09-21 DIAGNOSIS — I10 Essential (primary) hypertension: Secondary | ICD-10-CM | POA: Diagnosis not present

## 2019-09-21 DIAGNOSIS — G301 Alzheimer's disease with late onset: Secondary | ICD-10-CM | POA: Diagnosis not present

## 2019-09-21 DIAGNOSIS — M25542 Pain in joints of left hand: Secondary | ICD-10-CM | POA: Diagnosis not present

## 2019-09-21 DIAGNOSIS — M6281 Muscle weakness (generalized): Secondary | ICD-10-CM | POA: Diagnosis not present

## 2019-09-22 DIAGNOSIS — M25541 Pain in joints of right hand: Secondary | ICD-10-CM | POA: Diagnosis not present

## 2019-09-22 DIAGNOSIS — M25562 Pain in left knee: Secondary | ICD-10-CM | POA: Diagnosis not present

## 2019-09-22 DIAGNOSIS — D638 Anemia in other chronic diseases classified elsewhere: Secondary | ICD-10-CM | POA: Diagnosis not present

## 2019-09-22 DIAGNOSIS — E039 Hypothyroidism, unspecified: Secondary | ICD-10-CM | POA: Diagnosis not present

## 2019-09-22 DIAGNOSIS — G301 Alzheimer's disease with late onset: Secondary | ICD-10-CM | POA: Diagnosis not present

## 2019-09-22 DIAGNOSIS — R5381 Other malaise: Secondary | ICD-10-CM | POA: Diagnosis not present

## 2019-09-22 DIAGNOSIS — R2689 Other abnormalities of gait and mobility: Secondary | ICD-10-CM | POA: Diagnosis not present

## 2019-09-22 DIAGNOSIS — I10 Essential (primary) hypertension: Secondary | ICD-10-CM | POA: Diagnosis not present

## 2019-09-22 DIAGNOSIS — M25542 Pain in joints of left hand: Secondary | ICD-10-CM | POA: Diagnosis not present

## 2019-09-22 DIAGNOSIS — M6281 Muscle weakness (generalized): Secondary | ICD-10-CM | POA: Diagnosis not present

## 2019-09-23 DIAGNOSIS — M25542 Pain in joints of left hand: Secondary | ICD-10-CM | POA: Diagnosis not present

## 2019-09-23 DIAGNOSIS — G301 Alzheimer's disease with late onset: Secondary | ICD-10-CM | POA: Diagnosis not present

## 2019-09-23 DIAGNOSIS — M25562 Pain in left knee: Secondary | ICD-10-CM | POA: Diagnosis not present

## 2019-09-23 DIAGNOSIS — I10 Essential (primary) hypertension: Secondary | ICD-10-CM | POA: Diagnosis not present

## 2019-09-23 DIAGNOSIS — M6281 Muscle weakness (generalized): Secondary | ICD-10-CM | POA: Diagnosis not present

## 2019-09-23 DIAGNOSIS — R2689 Other abnormalities of gait and mobility: Secondary | ICD-10-CM | POA: Diagnosis not present

## 2019-09-23 DIAGNOSIS — M25541 Pain in joints of right hand: Secondary | ICD-10-CM | POA: Diagnosis not present

## 2019-10-05 DIAGNOSIS — F0280 Dementia in other diseases classified elsewhere without behavioral disturbance: Secondary | ICD-10-CM | POA: Diagnosis not present

## 2019-10-05 DIAGNOSIS — F5101 Primary insomnia: Secondary | ICD-10-CM | POA: Diagnosis not present

## 2019-10-05 DIAGNOSIS — G301 Alzheimer's disease with late onset: Secondary | ICD-10-CM | POA: Diagnosis not present

## 2019-10-18 DIAGNOSIS — M722 Plantar fascial fibromatosis: Secondary | ICD-10-CM | POA: Diagnosis not present

## 2019-10-18 DIAGNOSIS — L602 Onychogryphosis: Secondary | ICD-10-CM | POA: Diagnosis not present

## 2019-10-18 DIAGNOSIS — L603 Nail dystrophy: Secondary | ICD-10-CM | POA: Diagnosis not present

## 2019-10-18 DIAGNOSIS — I739 Peripheral vascular disease, unspecified: Secondary | ICD-10-CM | POA: Diagnosis not present

## 2019-10-20 DIAGNOSIS — D638 Anemia in other chronic diseases classified elsewhere: Secondary | ICD-10-CM | POA: Diagnosis not present

## 2019-10-20 DIAGNOSIS — E039 Hypothyroidism, unspecified: Secondary | ICD-10-CM | POA: Diagnosis not present

## 2019-10-20 DIAGNOSIS — R5381 Other malaise: Secondary | ICD-10-CM | POA: Diagnosis not present

## 2019-10-20 DIAGNOSIS — I10 Essential (primary) hypertension: Secondary | ICD-10-CM | POA: Diagnosis not present

## 2019-10-28 DIAGNOSIS — E039 Hypothyroidism, unspecified: Secondary | ICD-10-CM | POA: Diagnosis not present

## 2019-10-28 DIAGNOSIS — I10 Essential (primary) hypertension: Secondary | ICD-10-CM | POA: Diagnosis not present

## 2019-10-28 DIAGNOSIS — R5381 Other malaise: Secondary | ICD-10-CM | POA: Diagnosis not present

## 2019-10-28 DIAGNOSIS — H353120 Nonexudative age-related macular degeneration, left eye, stage unspecified: Secondary | ICD-10-CM | POA: Diagnosis not present

## 2019-11-07 DIAGNOSIS — G301 Alzheimer's disease with late onset: Secondary | ICD-10-CM | POA: Diagnosis not present

## 2019-11-07 DIAGNOSIS — F5101 Primary insomnia: Secondary | ICD-10-CM | POA: Diagnosis not present

## 2019-11-07 DIAGNOSIS — F0280 Dementia in other diseases classified elsewhere without behavioral disturbance: Secondary | ICD-10-CM | POA: Diagnosis not present

## 2019-11-24 DIAGNOSIS — R5381 Other malaise: Secondary | ICD-10-CM | POA: Diagnosis not present

## 2019-11-24 DIAGNOSIS — E039 Hypothyroidism, unspecified: Secondary | ICD-10-CM | POA: Diagnosis not present

## 2019-11-24 DIAGNOSIS — H353120 Nonexudative age-related macular degeneration, left eye, stage unspecified: Secondary | ICD-10-CM | POA: Diagnosis not present

## 2019-11-24 DIAGNOSIS — I10 Essential (primary) hypertension: Secondary | ICD-10-CM | POA: Diagnosis not present

## 2019-11-28 DIAGNOSIS — E782 Mixed hyperlipidemia: Secondary | ICD-10-CM | POA: Diagnosis not present

## 2019-11-28 DIAGNOSIS — H04123 Dry eye syndrome of bilateral lacrimal glands: Secondary | ICD-10-CM | POA: Diagnosis not present

## 2019-11-28 DIAGNOSIS — I10 Essential (primary) hypertension: Secondary | ICD-10-CM | POA: Diagnosis not present

## 2019-11-28 DIAGNOSIS — Z961 Presence of intraocular lens: Secondary | ICD-10-CM | POA: Diagnosis not present

## 2019-11-28 DIAGNOSIS — H524 Presbyopia: Secondary | ICD-10-CM | POA: Diagnosis not present

## 2019-11-28 DIAGNOSIS — H353120 Nonexudative age-related macular degeneration, left eye, stage unspecified: Secondary | ICD-10-CM | POA: Diagnosis not present

## 2019-11-28 DIAGNOSIS — H353133 Nonexudative age-related macular degeneration, bilateral, advanced atrophic without subfoveal involvement: Secondary | ICD-10-CM | POA: Diagnosis not present

## 2019-11-28 DIAGNOSIS — R5381 Other malaise: Secondary | ICD-10-CM | POA: Diagnosis not present

## 2019-12-05 DIAGNOSIS — F0280 Dementia in other diseases classified elsewhere without behavioral disturbance: Secondary | ICD-10-CM | POA: Diagnosis not present

## 2019-12-05 DIAGNOSIS — F5101 Primary insomnia: Secondary | ICD-10-CM | POA: Diagnosis not present

## 2019-12-05 DIAGNOSIS — G301 Alzheimer's disease with late onset: Secondary | ICD-10-CM | POA: Diagnosis not present

## 2019-12-22 DIAGNOSIS — R5381 Other malaise: Secondary | ICD-10-CM | POA: Diagnosis not present

## 2019-12-22 DIAGNOSIS — H353120 Nonexudative age-related macular degeneration, left eye, stage unspecified: Secondary | ICD-10-CM | POA: Diagnosis not present

## 2019-12-22 DIAGNOSIS — E039 Hypothyroidism, unspecified: Secondary | ICD-10-CM | POA: Diagnosis not present

## 2019-12-22 DIAGNOSIS — I10 Essential (primary) hypertension: Secondary | ICD-10-CM | POA: Diagnosis not present

## 2019-12-26 DIAGNOSIS — E782 Mixed hyperlipidemia: Secondary | ICD-10-CM | POA: Diagnosis not present

## 2019-12-26 DIAGNOSIS — D638 Anemia in other chronic diseases classified elsewhere: Secondary | ICD-10-CM | POA: Diagnosis not present

## 2019-12-26 DIAGNOSIS — E039 Hypothyroidism, unspecified: Secondary | ICD-10-CM | POA: Diagnosis not present

## 2019-12-26 DIAGNOSIS — M25359 Other instability, unspecified hip: Secondary | ICD-10-CM | POA: Diagnosis not present

## 2019-12-29 DIAGNOSIS — I10 Essential (primary) hypertension: Secondary | ICD-10-CM | POA: Diagnosis not present

## 2019-12-29 DIAGNOSIS — E039 Hypothyroidism, unspecified: Secondary | ICD-10-CM | POA: Diagnosis not present

## 2019-12-29 DIAGNOSIS — D649 Anemia, unspecified: Secondary | ICD-10-CM | POA: Diagnosis not present

## 2019-12-29 DIAGNOSIS — E119 Type 2 diabetes mellitus without complications: Secondary | ICD-10-CM | POA: Diagnosis not present

## 2019-12-31 DIAGNOSIS — I10 Essential (primary) hypertension: Secondary | ICD-10-CM | POA: Diagnosis not present

## 2019-12-31 DIAGNOSIS — E782 Mixed hyperlipidemia: Secondary | ICD-10-CM | POA: Diagnosis not present

## 2020-01-02 DIAGNOSIS — G301 Alzheimer's disease with late onset: Secondary | ICD-10-CM | POA: Diagnosis not present

## 2020-01-02 DIAGNOSIS — F5101 Primary insomnia: Secondary | ICD-10-CM | POA: Diagnosis not present

## 2020-01-02 DIAGNOSIS — F0280 Dementia in other diseases classified elsewhere without behavioral disturbance: Secondary | ICD-10-CM | POA: Diagnosis not present

## 2020-01-05 DIAGNOSIS — Z23 Encounter for immunization: Secondary | ICD-10-CM | POA: Diagnosis not present

## 2020-01-16 DIAGNOSIS — D649 Anemia, unspecified: Secondary | ICD-10-CM | POA: Diagnosis not present

## 2020-01-16 DIAGNOSIS — I10 Essential (primary) hypertension: Secondary | ICD-10-CM | POA: Diagnosis not present

## 2020-01-16 DIAGNOSIS — G2581 Restless legs syndrome: Secondary | ICD-10-CM | POA: Diagnosis not present

## 2020-01-16 DIAGNOSIS — R2689 Other abnormalities of gait and mobility: Secondary | ICD-10-CM | POA: Diagnosis not present

## 2020-01-16 DIAGNOSIS — M6281 Muscle weakness (generalized): Secondary | ICD-10-CM | POA: Diagnosis not present

## 2020-01-17 DIAGNOSIS — R2681 Unsteadiness on feet: Secondary | ICD-10-CM | POA: Diagnosis not present

## 2020-01-17 DIAGNOSIS — R633 Feeding difficulties, unspecified: Secondary | ICD-10-CM | POA: Diagnosis not present

## 2020-01-17 DIAGNOSIS — G2581 Restless legs syndrome: Secondary | ICD-10-CM | POA: Diagnosis not present

## 2020-01-17 DIAGNOSIS — E782 Mixed hyperlipidemia: Secondary | ICD-10-CM | POA: Diagnosis not present

## 2020-01-17 DIAGNOSIS — M6281 Muscle weakness (generalized): Secondary | ICD-10-CM | POA: Diagnosis not present

## 2020-01-17 DIAGNOSIS — I10 Essential (primary) hypertension: Secondary | ICD-10-CM | POA: Diagnosis not present

## 2020-01-17 DIAGNOSIS — D649 Anemia, unspecified: Secondary | ICD-10-CM | POA: Diagnosis not present

## 2020-01-17 DIAGNOSIS — G301 Alzheimer's disease with late onset: Secondary | ICD-10-CM | POA: Diagnosis not present

## 2020-01-17 DIAGNOSIS — R2689 Other abnormalities of gait and mobility: Secondary | ICD-10-CM | POA: Diagnosis not present

## 2020-01-17 DIAGNOSIS — E039 Hypothyroidism, unspecified: Secondary | ICD-10-CM | POA: Diagnosis not present

## 2020-01-17 DIAGNOSIS — D638 Anemia in other chronic diseases classified elsewhere: Secondary | ICD-10-CM | POA: Diagnosis not present

## 2020-01-18 DIAGNOSIS — G301 Alzheimer's disease with late onset: Secondary | ICD-10-CM | POA: Diagnosis not present

## 2020-01-18 DIAGNOSIS — M6281 Muscle weakness (generalized): Secondary | ICD-10-CM | POA: Diagnosis not present

## 2020-01-18 DIAGNOSIS — G2581 Restless legs syndrome: Secondary | ICD-10-CM | POA: Diagnosis not present

## 2020-01-18 DIAGNOSIS — I10 Essential (primary) hypertension: Secondary | ICD-10-CM | POA: Diagnosis not present

## 2020-01-18 DIAGNOSIS — R633 Feeding difficulties, unspecified: Secondary | ICD-10-CM | POA: Diagnosis not present

## 2020-01-18 DIAGNOSIS — D649 Anemia, unspecified: Secondary | ICD-10-CM | POA: Diagnosis not present

## 2020-01-18 DIAGNOSIS — R2689 Other abnormalities of gait and mobility: Secondary | ICD-10-CM | POA: Diagnosis not present

## 2020-01-18 DIAGNOSIS — R2681 Unsteadiness on feet: Secondary | ICD-10-CM | POA: Diagnosis not present

## 2020-01-19 DIAGNOSIS — G2581 Restless legs syndrome: Secondary | ICD-10-CM | POA: Diagnosis not present

## 2020-01-19 DIAGNOSIS — R2689 Other abnormalities of gait and mobility: Secondary | ICD-10-CM | POA: Diagnosis not present

## 2020-01-19 DIAGNOSIS — D649 Anemia, unspecified: Secondary | ICD-10-CM | POA: Diagnosis not present

## 2020-01-19 DIAGNOSIS — I10 Essential (primary) hypertension: Secondary | ICD-10-CM | POA: Diagnosis not present

## 2020-01-19 DIAGNOSIS — M6281 Muscle weakness (generalized): Secondary | ICD-10-CM | POA: Diagnosis not present

## 2020-01-22 DIAGNOSIS — I10 Essential (primary) hypertension: Secondary | ICD-10-CM | POA: Diagnosis not present

## 2020-01-22 DIAGNOSIS — R2689 Other abnormalities of gait and mobility: Secondary | ICD-10-CM | POA: Diagnosis not present

## 2020-01-22 DIAGNOSIS — G2581 Restless legs syndrome: Secondary | ICD-10-CM | POA: Diagnosis not present

## 2020-01-22 DIAGNOSIS — D649 Anemia, unspecified: Secondary | ICD-10-CM | POA: Diagnosis not present

## 2020-01-22 DIAGNOSIS — R2681 Unsteadiness on feet: Secondary | ICD-10-CM | POA: Diagnosis not present

## 2020-01-22 DIAGNOSIS — M6281 Muscle weakness (generalized): Secondary | ICD-10-CM | POA: Diagnosis not present

## 2020-01-22 DIAGNOSIS — R633 Feeding difficulties, unspecified: Secondary | ICD-10-CM | POA: Diagnosis not present

## 2020-01-22 DIAGNOSIS — G301 Alzheimer's disease with late onset: Secondary | ICD-10-CM | POA: Diagnosis not present

## 2020-01-23 DIAGNOSIS — R2681 Unsteadiness on feet: Secondary | ICD-10-CM | POA: Diagnosis not present

## 2020-01-23 DIAGNOSIS — G301 Alzheimer's disease with late onset: Secondary | ICD-10-CM | POA: Diagnosis not present

## 2020-01-23 DIAGNOSIS — D638 Anemia in other chronic diseases classified elsewhere: Secondary | ICD-10-CM | POA: Diagnosis not present

## 2020-01-23 DIAGNOSIS — I10 Essential (primary) hypertension: Secondary | ICD-10-CM | POA: Diagnosis not present

## 2020-01-23 DIAGNOSIS — D649 Anemia, unspecified: Secondary | ICD-10-CM | POA: Diagnosis not present

## 2020-01-23 DIAGNOSIS — G2581 Restless legs syndrome: Secondary | ICD-10-CM | POA: Diagnosis not present

## 2020-01-23 DIAGNOSIS — M25562 Pain in left knee: Secondary | ICD-10-CM | POA: Diagnosis not present

## 2020-01-23 DIAGNOSIS — M6281 Muscle weakness (generalized): Secondary | ICD-10-CM | POA: Diagnosis not present

## 2020-01-23 DIAGNOSIS — E039 Hypothyroidism, unspecified: Secondary | ICD-10-CM | POA: Diagnosis not present

## 2020-01-23 DIAGNOSIS — R2689 Other abnormalities of gait and mobility: Secondary | ICD-10-CM | POA: Diagnosis not present

## 2020-01-23 DIAGNOSIS — R633 Feeding difficulties, unspecified: Secondary | ICD-10-CM | POA: Diagnosis not present

## 2020-01-23 DIAGNOSIS — R1312 Dysphagia, oropharyngeal phase: Secondary | ICD-10-CM | POA: Diagnosis not present

## 2020-01-24 DIAGNOSIS — I10 Essential (primary) hypertension: Secondary | ICD-10-CM | POA: Diagnosis not present

## 2020-01-24 DIAGNOSIS — G301 Alzheimer's disease with late onset: Secondary | ICD-10-CM | POA: Diagnosis not present

## 2020-01-24 DIAGNOSIS — R633 Feeding difficulties, unspecified: Secondary | ICD-10-CM | POA: Diagnosis not present

## 2020-01-24 DIAGNOSIS — M6281 Muscle weakness (generalized): Secondary | ICD-10-CM | POA: Diagnosis not present

## 2020-01-24 DIAGNOSIS — G2581 Restless legs syndrome: Secondary | ICD-10-CM | POA: Diagnosis not present

## 2020-01-24 DIAGNOSIS — R2681 Unsteadiness on feet: Secondary | ICD-10-CM | POA: Diagnosis not present

## 2020-01-24 DIAGNOSIS — D649 Anemia, unspecified: Secondary | ICD-10-CM | POA: Diagnosis not present

## 2020-01-24 DIAGNOSIS — R2689 Other abnormalities of gait and mobility: Secondary | ICD-10-CM | POA: Diagnosis not present

## 2020-01-25 DIAGNOSIS — R2681 Unsteadiness on feet: Secondary | ICD-10-CM | POA: Diagnosis not present

## 2020-01-25 DIAGNOSIS — M6281 Muscle weakness (generalized): Secondary | ICD-10-CM | POA: Diagnosis not present

## 2020-01-25 DIAGNOSIS — R2689 Other abnormalities of gait and mobility: Secondary | ICD-10-CM | POA: Diagnosis not present

## 2020-01-25 DIAGNOSIS — I10 Essential (primary) hypertension: Secondary | ICD-10-CM | POA: Diagnosis not present

## 2020-01-25 DIAGNOSIS — R1312 Dysphagia, oropharyngeal phase: Secondary | ICD-10-CM | POA: Diagnosis not present

## 2020-01-25 DIAGNOSIS — R633 Feeding difficulties, unspecified: Secondary | ICD-10-CM | POA: Diagnosis not present

## 2020-01-25 DIAGNOSIS — G2581 Restless legs syndrome: Secondary | ICD-10-CM | POA: Diagnosis not present

## 2020-01-25 DIAGNOSIS — D649 Anemia, unspecified: Secondary | ICD-10-CM | POA: Diagnosis not present

## 2020-01-25 DIAGNOSIS — G301 Alzheimer's disease with late onset: Secondary | ICD-10-CM | POA: Diagnosis not present

## 2020-01-26 DIAGNOSIS — R1312 Dysphagia, oropharyngeal phase: Secondary | ICD-10-CM | POA: Diagnosis not present

## 2020-01-26 DIAGNOSIS — D649 Anemia, unspecified: Secondary | ICD-10-CM | POA: Diagnosis not present

## 2020-01-26 DIAGNOSIS — G2581 Restless legs syndrome: Secondary | ICD-10-CM | POA: Diagnosis not present

## 2020-01-26 DIAGNOSIS — M6281 Muscle weakness (generalized): Secondary | ICD-10-CM | POA: Diagnosis not present

## 2020-01-26 DIAGNOSIS — R2689 Other abnormalities of gait and mobility: Secondary | ICD-10-CM | POA: Diagnosis not present

## 2020-01-26 DIAGNOSIS — G301 Alzheimer's disease with late onset: Secondary | ICD-10-CM | POA: Diagnosis not present

## 2020-01-26 DIAGNOSIS — I10 Essential (primary) hypertension: Secondary | ICD-10-CM | POA: Diagnosis not present

## 2020-01-26 DIAGNOSIS — R633 Feeding difficulties, unspecified: Secondary | ICD-10-CM | POA: Diagnosis not present

## 2020-01-26 DIAGNOSIS — R2681 Unsteadiness on feet: Secondary | ICD-10-CM | POA: Diagnosis not present

## 2020-01-27 DIAGNOSIS — R2681 Unsteadiness on feet: Secondary | ICD-10-CM | POA: Diagnosis not present

## 2020-01-27 DIAGNOSIS — I10 Essential (primary) hypertension: Secondary | ICD-10-CM | POA: Diagnosis not present

## 2020-01-27 DIAGNOSIS — D649 Anemia, unspecified: Secondary | ICD-10-CM | POA: Diagnosis not present

## 2020-01-27 DIAGNOSIS — M6281 Muscle weakness (generalized): Secondary | ICD-10-CM | POA: Diagnosis not present

## 2020-01-27 DIAGNOSIS — R633 Feeding difficulties, unspecified: Secondary | ICD-10-CM | POA: Diagnosis not present

## 2020-01-27 DIAGNOSIS — R1312 Dysphagia, oropharyngeal phase: Secondary | ICD-10-CM | POA: Diagnosis not present

## 2020-01-27 DIAGNOSIS — G2581 Restless legs syndrome: Secondary | ICD-10-CM | POA: Diagnosis not present

## 2020-01-27 DIAGNOSIS — G301 Alzheimer's disease with late onset: Secondary | ICD-10-CM | POA: Diagnosis not present

## 2020-01-27 DIAGNOSIS — R2689 Other abnormalities of gait and mobility: Secondary | ICD-10-CM | POA: Diagnosis not present

## 2020-01-30 DIAGNOSIS — D649 Anemia, unspecified: Secondary | ICD-10-CM | POA: Diagnosis not present

## 2020-01-30 DIAGNOSIS — G2581 Restless legs syndrome: Secondary | ICD-10-CM | POA: Diagnosis not present

## 2020-01-30 DIAGNOSIS — M6281 Muscle weakness (generalized): Secondary | ICD-10-CM | POA: Diagnosis not present

## 2020-01-30 DIAGNOSIS — R2689 Other abnormalities of gait and mobility: Secondary | ICD-10-CM | POA: Diagnosis not present

## 2020-01-30 DIAGNOSIS — R2681 Unsteadiness on feet: Secondary | ICD-10-CM | POA: Diagnosis not present

## 2020-01-30 DIAGNOSIS — R633 Feeding difficulties, unspecified: Secondary | ICD-10-CM | POA: Diagnosis not present

## 2020-01-30 DIAGNOSIS — R1312 Dysphagia, oropharyngeal phase: Secondary | ICD-10-CM | POA: Diagnosis not present

## 2020-01-30 DIAGNOSIS — I10 Essential (primary) hypertension: Secondary | ICD-10-CM | POA: Diagnosis not present

## 2020-01-30 DIAGNOSIS — G301 Alzheimer's disease with late onset: Secondary | ICD-10-CM | POA: Diagnosis not present

## 2020-01-31 DIAGNOSIS — M6281 Muscle weakness (generalized): Secondary | ICD-10-CM | POA: Diagnosis not present

## 2020-01-31 DIAGNOSIS — D649 Anemia, unspecified: Secondary | ICD-10-CM | POA: Diagnosis not present

## 2020-01-31 DIAGNOSIS — G301 Alzheimer's disease with late onset: Secondary | ICD-10-CM | POA: Diagnosis not present

## 2020-01-31 DIAGNOSIS — I10 Essential (primary) hypertension: Secondary | ICD-10-CM | POA: Diagnosis not present

## 2020-01-31 DIAGNOSIS — R1312 Dysphagia, oropharyngeal phase: Secondary | ICD-10-CM | POA: Diagnosis not present

## 2020-01-31 DIAGNOSIS — G2581 Restless legs syndrome: Secondary | ICD-10-CM | POA: Diagnosis not present

## 2020-01-31 DIAGNOSIS — R2689 Other abnormalities of gait and mobility: Secondary | ICD-10-CM | POA: Diagnosis not present

## 2020-02-01 DIAGNOSIS — R1312 Dysphagia, oropharyngeal phase: Secondary | ICD-10-CM | POA: Diagnosis not present

## 2020-02-01 DIAGNOSIS — E039 Hypothyroidism, unspecified: Secondary | ICD-10-CM | POA: Diagnosis not present

## 2020-02-01 DIAGNOSIS — G301 Alzheimer's disease with late onset: Secondary | ICD-10-CM | POA: Diagnosis not present

## 2020-02-01 DIAGNOSIS — I4510 Unspecified right bundle-branch block: Secondary | ICD-10-CM | POA: Diagnosis not present

## 2020-02-01 DIAGNOSIS — R9431 Abnormal electrocardiogram [ECG] [EKG]: Secondary | ICD-10-CM | POA: Diagnosis not present

## 2020-02-01 DIAGNOSIS — I10 Essential (primary) hypertension: Secondary | ICD-10-CM | POA: Diagnosis not present

## 2020-02-01 DIAGNOSIS — R0789 Other chest pain: Secondary | ICD-10-CM | POA: Diagnosis not present

## 2020-02-01 DIAGNOSIS — G2581 Restless legs syndrome: Secondary | ICD-10-CM | POA: Diagnosis not present

## 2020-02-01 DIAGNOSIS — R2689 Other abnormalities of gait and mobility: Secondary | ICD-10-CM | POA: Diagnosis not present

## 2020-02-01 DIAGNOSIS — R2681 Unsteadiness on feet: Secondary | ICD-10-CM | POA: Diagnosis not present

## 2020-02-01 DIAGNOSIS — H353120 Nonexudative age-related macular degeneration, left eye, stage unspecified: Secondary | ICD-10-CM | POA: Diagnosis not present

## 2020-02-01 DIAGNOSIS — I440 Atrioventricular block, first degree: Secondary | ICD-10-CM | POA: Diagnosis not present

## 2020-02-01 DIAGNOSIS — D649 Anemia, unspecified: Secondary | ICD-10-CM | POA: Diagnosis not present

## 2020-02-01 DIAGNOSIS — R079 Chest pain, unspecified: Secondary | ICD-10-CM | POA: Diagnosis not present

## 2020-02-01 DIAGNOSIS — M6281 Muscle weakness (generalized): Secondary | ICD-10-CM | POA: Diagnosis not present

## 2020-02-01 DIAGNOSIS — R633 Feeding difficulties, unspecified: Secondary | ICD-10-CM | POA: Diagnosis not present

## 2020-02-02 DIAGNOSIS — G301 Alzheimer's disease with late onset: Secondary | ICD-10-CM | POA: Diagnosis not present

## 2020-02-02 DIAGNOSIS — D649 Anemia, unspecified: Secondary | ICD-10-CM | POA: Diagnosis not present

## 2020-02-02 DIAGNOSIS — R2689 Other abnormalities of gait and mobility: Secondary | ICD-10-CM | POA: Diagnosis not present

## 2020-02-02 DIAGNOSIS — I10 Essential (primary) hypertension: Secondary | ICD-10-CM | POA: Diagnosis not present

## 2020-02-02 DIAGNOSIS — G2581 Restless legs syndrome: Secondary | ICD-10-CM | POA: Diagnosis not present

## 2020-02-02 DIAGNOSIS — M6281 Muscle weakness (generalized): Secondary | ICD-10-CM | POA: Diagnosis not present

## 2020-02-02 DIAGNOSIS — R1312 Dysphagia, oropharyngeal phase: Secondary | ICD-10-CM | POA: Diagnosis not present

## 2020-02-03 ENCOUNTER — Inpatient Hospital Stay (HOSPITAL_COMMUNITY)
Admission: EM | Admit: 2020-02-03 | Discharge: 2020-02-09 | DRG: 871 | Disposition: A | Discharge status: Skilled Nursing Facility | Payer: Medicare Other | Source: Skilled Nursing Facility | Attending: Internal Medicine | Admitting: Internal Medicine

## 2020-02-03 ENCOUNTER — Emergency Department (HOSPITAL_COMMUNITY): Payer: Medicare Other

## 2020-02-03 ENCOUNTER — Encounter (HOSPITAL_COMMUNITY): Payer: Self-pay

## 2020-02-03 ENCOUNTER — Other Ambulatory Visit: Payer: Self-pay

## 2020-02-03 DIAGNOSIS — M549 Dorsalgia, unspecified: Secondary | ICD-10-CM | POA: Diagnosis not present

## 2020-02-03 DIAGNOSIS — I21A1 Myocardial infarction type 2: Secondary | ICD-10-CM | POA: Diagnosis not present

## 2020-02-03 DIAGNOSIS — N136 Pyonephrosis: Secondary | ICD-10-CM | POA: Diagnosis not present

## 2020-02-03 DIAGNOSIS — I700 Atherosclerosis of aorta: Secondary | ICD-10-CM | POA: Diagnosis not present

## 2020-02-03 DIAGNOSIS — N200 Calculus of kidney: Secondary | ICD-10-CM

## 2020-02-03 DIAGNOSIS — D649 Anemia, unspecified: Secondary | ICD-10-CM | POA: Diagnosis present

## 2020-02-03 DIAGNOSIS — R9431 Abnormal electrocardiogram [ECG] [EKG]: Secondary | ICD-10-CM | POA: Diagnosis not present

## 2020-02-03 DIAGNOSIS — I739 Peripheral vascular disease, unspecified: Secondary | ICD-10-CM | POA: Diagnosis present

## 2020-02-03 DIAGNOSIS — M1990 Unspecified osteoarthritis, unspecified site: Secondary | ICD-10-CM | POA: Diagnosis present

## 2020-02-03 DIAGNOSIS — R627 Adult failure to thrive: Secondary | ICD-10-CM | POA: Diagnosis not present

## 2020-02-03 DIAGNOSIS — R509 Fever, unspecified: Secondary | ICD-10-CM | POA: Diagnosis not present

## 2020-02-03 DIAGNOSIS — Z7401 Bed confinement status: Secondary | ICD-10-CM | POA: Diagnosis not present

## 2020-02-03 DIAGNOSIS — Z6821 Body mass index (BMI) 21.0-21.9, adult: Secondary | ICD-10-CM

## 2020-02-03 DIAGNOSIS — R1032 Left lower quadrant pain: Secondary | ICD-10-CM | POA: Diagnosis not present

## 2020-02-03 DIAGNOSIS — H3530 Unspecified macular degeneration: Secondary | ICD-10-CM | POA: Diagnosis present

## 2020-02-03 DIAGNOSIS — E039 Hypothyroidism, unspecified: Secondary | ICD-10-CM | POA: Diagnosis present

## 2020-02-03 DIAGNOSIS — R410 Disorientation, unspecified: Secondary | ICD-10-CM | POA: Diagnosis present

## 2020-02-03 DIAGNOSIS — E785 Hyperlipidemia, unspecified: Secondary | ICD-10-CM | POA: Diagnosis not present

## 2020-02-03 DIAGNOSIS — A419 Sepsis, unspecified organism: Secondary | ICD-10-CM | POA: Diagnosis not present

## 2020-02-03 DIAGNOSIS — Z66 Do not resuscitate: Secondary | ICD-10-CM | POA: Diagnosis not present

## 2020-02-03 DIAGNOSIS — N179 Acute kidney failure, unspecified: Secondary | ICD-10-CM | POA: Diagnosis present

## 2020-02-03 DIAGNOSIS — R4189 Other symptoms and signs involving cognitive functions and awareness: Secondary | ICD-10-CM | POA: Diagnosis present

## 2020-02-03 DIAGNOSIS — E870 Hyperosmolality and hypernatremia: Secondary | ICD-10-CM | POA: Diagnosis not present

## 2020-02-03 DIAGNOSIS — I10 Essential (primary) hypertension: Secondary | ICD-10-CM | POA: Diagnosis not present

## 2020-02-03 DIAGNOSIS — Z7982 Long term (current) use of aspirin: Secondary | ICD-10-CM

## 2020-02-03 DIAGNOSIS — R6521 Severe sepsis with septic shock: Secondary | ICD-10-CM | POA: Diagnosis present

## 2020-02-03 DIAGNOSIS — Z1611 Resistance to penicillins: Secondary | ICD-10-CM | POA: Diagnosis present

## 2020-02-03 DIAGNOSIS — E440 Moderate protein-calorie malnutrition: Secondary | ICD-10-CM | POA: Diagnosis not present

## 2020-02-03 DIAGNOSIS — R000 Tachycardia, unspecified: Secondary | ICD-10-CM | POA: Diagnosis not present

## 2020-02-03 DIAGNOSIS — R6520 Severe sepsis without septic shock: Secondary | ICD-10-CM | POA: Diagnosis present

## 2020-02-03 DIAGNOSIS — I083 Combined rheumatic disorders of mitral, aortic and tricuspid valves: Secondary | ICD-10-CM | POA: Diagnosis present

## 2020-02-03 DIAGNOSIS — B961 Klebsiella pneumoniae [K. pneumoniae] as the cause of diseases classified elsewhere: Secondary | ICD-10-CM | POA: Diagnosis not present

## 2020-02-03 DIAGNOSIS — Z823 Family history of stroke: Secondary | ICD-10-CM

## 2020-02-03 DIAGNOSIS — A4159 Other Gram-negative sepsis: Secondary | ICD-10-CM | POA: Diagnosis not present

## 2020-02-03 DIAGNOSIS — Z79890 Hormone replacement therapy (postmenopausal): Secondary | ICD-10-CM | POA: Diagnosis not present

## 2020-02-03 DIAGNOSIS — I350 Nonrheumatic aortic (valve) stenosis: Secondary | ICD-10-CM | POA: Diagnosis not present

## 2020-02-03 DIAGNOSIS — R109 Unspecified abdominal pain: Secondary | ICD-10-CM | POA: Diagnosis not present

## 2020-02-03 DIAGNOSIS — R079 Chest pain, unspecified: Secondary | ICD-10-CM | POA: Diagnosis not present

## 2020-02-03 DIAGNOSIS — G301 Alzheimer's disease with late onset: Secondary | ICD-10-CM | POA: Diagnosis not present

## 2020-02-03 DIAGNOSIS — Z20822 Contact with and (suspected) exposure to covid-19: Secondary | ICD-10-CM | POA: Diagnosis not present

## 2020-02-03 DIAGNOSIS — I214 Non-ST elevation (NSTEMI) myocardial infarction: Secondary | ICD-10-CM | POA: Diagnosis not present

## 2020-02-03 DIAGNOSIS — R0789 Other chest pain: Secondary | ICD-10-CM | POA: Diagnosis not present

## 2020-02-03 DIAGNOSIS — Z515 Encounter for palliative care: Secondary | ICD-10-CM

## 2020-02-03 DIAGNOSIS — R54 Age-related physical debility: Secondary | ICD-10-CM | POA: Diagnosis present

## 2020-02-03 DIAGNOSIS — A414 Sepsis due to anaerobes: Secondary | ICD-10-CM | POA: Diagnosis not present

## 2020-02-03 DIAGNOSIS — Z7189 Other specified counseling: Secondary | ICD-10-CM | POA: Diagnosis not present

## 2020-02-03 DIAGNOSIS — I340 Nonrheumatic mitral (valve) insufficiency: Secondary | ICD-10-CM | POA: Diagnosis not present

## 2020-02-03 DIAGNOSIS — Z8249 Family history of ischemic heart disease and other diseases of the circulatory system: Secondary | ICD-10-CM

## 2020-02-03 DIAGNOSIS — R1312 Dysphagia, oropharyngeal phase: Secondary | ICD-10-CM | POA: Diagnosis not present

## 2020-02-03 DIAGNOSIS — N132 Hydronephrosis with renal and ureteral calculous obstruction: Secondary | ICD-10-CM | POA: Diagnosis not present

## 2020-02-03 DIAGNOSIS — N390 Urinary tract infection, site not specified: Secondary | ICD-10-CM | POA: Diagnosis not present

## 2020-02-03 DIAGNOSIS — I351 Nonrheumatic aortic (valve) insufficiency: Secondary | ICD-10-CM | POA: Diagnosis not present

## 2020-02-03 LAB — CBC WITH DIFFERENTIAL/PLATELET
Abs Immature Granulocytes: 0.33 10*3/uL — ABNORMAL HIGH (ref 0.00–0.07)
Basophils Absolute: 0 10*3/uL (ref 0.0–0.1)
Basophils Relative: 1 %
Eosinophils Absolute: 0.1 10*3/uL (ref 0.0–0.5)
Eosinophils Relative: 1 %
HCT: 36.8 % (ref 36.0–46.0)
Hemoglobin: 11.8 g/dL — ABNORMAL LOW (ref 12.0–15.0)
Immature Granulocytes: 5 %
Lymphocytes Relative: 11 %
Lymphs Abs: 0.8 10*3/uL (ref 0.7–4.0)
MCH: 29.6 pg (ref 26.0–34.0)
MCHC: 32.1 g/dL (ref 30.0–36.0)
MCV: 92.5 fL (ref 80.0–100.0)
Monocytes Absolute: 0.1 10*3/uL (ref 0.1–1.0)
Monocytes Relative: 1 %
Neutro Abs: 6 10*3/uL (ref 1.7–7.7)
Neutrophils Relative %: 81 %
Platelets: 214 10*3/uL (ref 150–400)
RBC: 3.98 MIL/uL (ref 3.87–5.11)
RDW: 13.7 % (ref 11.5–15.5)
WBC: 7.3 10*3/uL (ref 4.0–10.5)
nRBC: 0 % (ref 0.0–0.2)

## 2020-02-03 LAB — URINALYSIS, ROUTINE W REFLEX MICROSCOPIC
Bilirubin Urine: NEGATIVE
Glucose, UA: NEGATIVE mg/dL
Ketones, ur: NEGATIVE mg/dL
Nitrite: POSITIVE — AB
Protein, ur: 100 mg/dL — AB
Specific Gravity, Urine: 1.017 (ref 1.005–1.030)
WBC, UA: >50 WBC/hpf — ABNORMAL HIGH (ref 0–5)
pH: 5 (ref 5.0–8.0)

## 2020-02-03 LAB — PROTIME-INR
INR: 1.2 (ref 0.8–1.2)
Prothrombin Time: 14.3 seconds (ref 11.4–15.2)

## 2020-02-03 LAB — RESP PANEL BY RT-PCR (FLU A&B, COVID) ARPGX2
Influenza A by PCR: NEGATIVE
Influenza B by PCR: NEGATIVE
SARS Coronavirus 2 by RT PCR: NEGATIVE

## 2020-02-03 LAB — COMPREHENSIVE METABOLIC PANEL
ALT: 17 U/L (ref 0–44)
AST: 24 U/L (ref 15–41)
Albumin: 2.6 g/dL — ABNORMAL LOW (ref 3.5–5.0)
Alkaline Phosphatase: 104 U/L (ref 38–126)
Anion gap: 15 (ref 5–15)
BUN: 37 mg/dL — ABNORMAL HIGH (ref 8–23)
CO2: 20 mmol/L — ABNORMAL LOW (ref 22–32)
Calcium: 8.8 mg/dL — ABNORMAL LOW (ref 8.9–10.3)
Chloride: 106 mmol/L (ref 98–111)
Creatinine, Ser: 1.59 mg/dL — ABNORMAL HIGH (ref 0.44–1.00)
GFR, Estimated: 30 mL/min — ABNORMAL LOW (ref >60)
Glucose, Bld: 110 mg/dL — ABNORMAL HIGH (ref 70–99)
Potassium: 4.5 mmol/L (ref 3.5–5.1)
Sodium: 141 mmol/L (ref 135–145)
Total Bilirubin: 0.9 mg/dL (ref 0.3–1.2)
Total Protein: 7.4 g/dL (ref 6.5–8.1)

## 2020-02-03 LAB — TROPONIN I (HIGH SENSITIVITY)
Troponin I (High Sensitivity): 187 ng/L (ref <18)
Troponin I (High Sensitivity): 1988 ng/L (ref <18)

## 2020-02-03 LAB — LACTIC ACID, PLASMA: Lactic Acid, Venous: 3.8 mmol/L (ref 0.5–1.9)

## 2020-02-03 LAB — APTT: aPTT: 26 seconds (ref 24–36)

## 2020-02-03 MED ORDER — IOHEXOL 300 MG/ML  SOLN
75.0000 mL | Freq: Once | INTRAMUSCULAR | Status: AC | PRN
  Administered 2020-02-03: 75 mL via INTRAVENOUS

## 2020-02-03 MED ORDER — DOCUSATE SODIUM 100 MG PO CAPS
100.0000 mg | ORAL_CAPSULE | Freq: Two times a day (BID) | ORAL | Status: DC | PRN
Start: 2020-02-03 — End: 2020-02-09
  Administered 2020-02-08: 100 mg via ORAL
  Filled 2020-02-03: qty 1

## 2020-02-03 MED ORDER — VANCOMYCIN HCL 750 MG/150ML IV SOLN: 750.0000 mg | INTRAVENOUS | Status: DC

## 2020-02-03 MED ORDER — NOREPINEPHRINE 4 MG/250ML-% IV SOLN
0.0000 ug/min | INTRAVENOUS | Status: DC
  Administered 2020-02-03: 2 ug/min via INTRAVENOUS
  Filled 2020-02-03: qty 250

## 2020-02-03 MED ORDER — HEPARIN SODIUM (PORCINE) 5000 UNIT/ML IJ SOLN
5000.0000 [IU] | Freq: Three times a day (TID) | INTRAMUSCULAR | Status: DC
  Administered 2020-02-04 – 2020-02-09 (×16): 5000 [IU] via SUBCUTANEOUS
  Filled 2020-02-03 (×16): qty 1

## 2020-02-03 MED ORDER — POLYETHYLENE GLYCOL 3350 17 G PO PACK: 17.0000 g | PACK | Freq: Every day | ORAL | Status: DC | PRN

## 2020-02-03 MED ORDER — LACTATED RINGERS IV BOLUS (SEPSIS)
1000.0000 mL | Freq: Once | INTRAVENOUS | Status: AC
  Administered 2020-02-03: 1000 mL via INTRAVENOUS

## 2020-02-03 MED ORDER — ONDANSETRON HCL 4 MG/2ML IJ SOLN
4.0000 mg | Freq: Once | INTRAMUSCULAR | Status: AC
  Administered 2020-02-03: 4 mg via INTRAVENOUS
  Filled 2020-02-03: qty 2

## 2020-02-03 MED ORDER — METRONIDAZOLE IN NACL 5-0.79 MG/ML-% IV SOLN
500.0000 mg | Freq: Once | INTRAVENOUS | Status: AC
  Administered 2020-02-03: 500 mg via INTRAVENOUS
  Filled 2020-02-03: qty 100

## 2020-02-03 MED ORDER — SODIUM CHLORIDE 0.9 % IV SOLN
2.0000 g | Freq: Once | INTRAVENOUS | Status: AC
  Administered 2020-02-03: 2 g via INTRAVENOUS
  Filled 2020-02-03: qty 2

## 2020-02-03 MED ORDER — LACTATED RINGERS IV BOLUS
776.0000 mL | Freq: Once | INTRAVENOUS | Status: AC
  Administered 2020-02-03: 776 mL via INTRAVENOUS

## 2020-02-03 MED ORDER — LACTATED RINGERS IV SOLN: INTRAVENOUS | Status: DC

## 2020-02-03 MED ORDER — ACETAMINOPHEN 650 MG RE SUPP
650.0000 mg | Freq: Once | RECTAL | Status: AC
  Administered 2020-02-03: 650 mg via RECTAL
  Filled 2020-02-03: qty 1

## 2020-02-03 MED ORDER — ASPIRIN 300 MG RE SUPP
300.0000 mg | Freq: Once | RECTAL | Status: AC
  Administered 2020-02-03: 300 mg via RECTAL
  Filled 2020-02-03: qty 1

## 2020-02-03 MED ORDER — CEFEPIME HCL 1 G IJ SOLR: 1.0000 g | Freq: Two times a day (BID) | INTRAMUSCULAR | Status: DC

## 2020-02-03 MED ORDER — VANCOMYCIN HCL 1250 MG/250ML IV SOLN
1250.0000 mg | Freq: Once | INTRAVENOUS | Status: AC
  Administered 2020-02-03: 1250 mg via INTRAVENOUS
  Filled 2020-02-03: qty 250

## 2020-02-03 MED ORDER — PANTOPRAZOLE SODIUM 40 MG IV SOLR: 40.0000 mg | Freq: Every day | INTRAVENOUS | Status: DC

## 2020-02-03 MED ORDER — SODIUM CHLORIDE 0.9 % IV SOLN: 2.0000 g | INTRAVENOUS | Status: DC

## 2020-02-03 NOTE — H&P (Incomplete)
NAME:  [name], MRN:  [id number], DOB:  01-13-1925, LOS: 0 ADMISSION DATE:  02/03/2020, CONSULTATION DATE:  *** REFERRING MD:  ***, CHIEF COMPLAINT:  ***   Brief History   86 y/o presented with severe sepsis from renal calculi.  History of present illness   ***  Past Medical History  ***  Significant Hospital Events   ***  Consults:  Urology Cardiology  Procedures:  None   Significant Diagnostic Tests:  ***  Micro Data:  Covid: negative Flu: negative  Antimicrobials:  Vancomycin Cefepime    Objective   Blood pressure (!) 110/50, pulse 73, temperature 99.3 F (37.4 C), temperature source Oral, resp. rate (!) 27, height 5\' 4"  (1.626 m), weight 59.2 kg, SpO2 96 %.        Intake/Output Summary (Last 24 hours) at 02/03/2020 2355 Last data filed at 02/03/2020 2005 Gross per 24 hour  Intake -  Output 50 ml  Net -50 ml   Filed Weights   02/03/20 1828  Weight: 59.2 kg    Examination: General: No acute  HENT: *** Lungs: *** Cardiovascular: *** Abdomen: *** Extremities: *** Neuro: *** GU: ***  Resolved Hospital Problem list   ***  Assessment & Plan:  ***  Best practice (evaluated daily)   Diet: *** Pain/Anxiety/Delirium protocol (if indicated): *** VAP protocol (if indicated): *** DVT prophylaxis: *** GI prophylaxis: *** Glucose control: *** Mobility: *** last date of multidisciplinary goals of care discussion*** Family and staff present *** Summary of discussion *** Follow up goals of care discussion due*** Code Status: *** Disposition: ***  Labs   CBC: Recent Labs  Lab 02/03/20 1720  WBC 7.3  NEUTROABS 6.0  HGB 11.8*  HCT 36.8  MCV 92.5  PLT 214    Basic Metabolic Panel: Recent Labs  Lab 02/03/20 1720  NA 141  K 4.5  CL 106  CO2 20*  GLUCOSE 110*  BUN 37*  CREATININE 1.59*  CALCIUM 8.8*   GFR: Estimated Creatinine Clearance: 19.1 mL/min (A) (by C-G formula based on SCr of 1.59 mg/dL (H)). Recent Labs  Lab  02/03/20 1720  WBC 7.3  LATICACIDVEN 3.8*    Liver Function Tests: Recent Labs  Lab 02/03/20 1720  AST 24  ALT 17  ALKPHOS 104  BILITOT 0.9  PROT 7.4  ALBUMIN 2.6*   No results for input(s): LIPASE, AMYLASE in the last 168 hours. No results for input(s): AMMONIA in the last 168 hours.  ABG No results found for: PHART, PCO2ART, PO2ART, HCO3, TCO2, ACIDBASEDEF, O2SAT   Coagulation Profile: Recent Labs  Lab 02/03/20 1720  INR 1.2    Cardiac Enzymes: No results for input(s): CKTOTAL, CKMB, CKMBINDEX, TROPONINI in the last 168 hours.  HbA1C: No results found for: HGBA1C  CBG: No results for input(s): GLUCAP in the last 168 hours.  Review of Systems:   ***  Past Medical History  She,  has a past medical history of ARF (acute renal failure) (HCC) (11/2017), Arthritis, Carotid artery occlusion, Hypertension, Hypothyroidism, Macular degeneration of left eye, Multiple falls (12/16/2017), and UTI (urinary tract infection) (12/16/2017).   Surgical History    Past Surgical History:  Procedure Laterality Date  . APPENDECTOMY    . CAROTID ENDARTERECTOMY  04/14/2008   Right  . CATARACT EXTRACTION    . LUMBAR LAMINECTOMY       Social History   reports that she has never smoked. She has never used smokeless tobacco. She reports that she does not drink alcohol and  does not use drugs.   Family History   Her family history includes Heart attack in her mother; Heart disease in her mother; Hypertension in her mother; Other in her mother; Stroke in her mother.   Allergies No Known Allergies   Home Medications  Prior to Admission medications   Medication Sig Start Date End Date Taking? Authorizing Provider  aspirin 81 MG tablet Take 81 mg by mouth daily.   Yes [provider]  atenolol (TENORMIN) 25 MG tablet Take 25 mg by mouth daily.   Yes [provider]  atorvastatin (LIPITOR) 20 MG tablet Take 20 mg by mouth daily.   Yes [provider]   Calcium-Vitamin D (CALTRATE 600 PLUS-VIT D PO) Take 2 tablets by mouth daily.    Yes [provider]  celecoxib (CELEBREX) 200 MG capsule Take 200 mg by mouth 2 (two) times daily.   Yes [provider]  cetirizine (ZYRTEC) 10 MG tablet Take 10 mg by mouth daily.   Yes [provider]  docusate sodium (COLACE) 100 MG capsule Take 100 mg by mouth 2 (two) times daily.   Yes [provider]  levothyroxine (SYNTHROID, LEVOTHROID) 75 MCG tablet Take 75 mcg by mouth daily before breakfast.    Yes [provider]  melatonin 3 MG TABS tablet Take 6 mg by mouth at bedtime.   Yes [provider]  Menthol, Topical Analgesic, (BIOFREEZE) 4 % GEL Apply 1 application topically in the morning and at bedtime.   Yes [provider]  Polyethyl Glycol-Propyl Glycol (SYSTANE) 0.4-0.3 % GEL ophthalmic gel Place 1 application into both eyes in the morning, at noon, in the evening, and at bedtime.   Yes [provider]  pramipexole (MIRAPEX) 0.125 MG tablet Take 0.125 mg by mouth at bedtime.   Yes [provider]  traMADol (ULTRAM) 50 MG tablet Take 50 mg by mouth every 4 (four) hours as needed for moderate pain.   Yes [provider]  acetaminophen (TYLENOL) 325 MG tablet Take 2 tablets (650 mg total) by mouth every 6 (six) hours as needed for mild pain or moderate pain (or Fever >/= 101). 01/20/18   [name], MD     Critical care time: 86y/o

## 2020-02-03 NOTE — ED Provider Notes (Signed)
I saw and evaluated the patient, reviewed the resident's note and I agree with the findings and plan.  Pertinent History: This patient is an elderly 86 year old female presenting from a nursing facility after having abnormal lab work, she had been having some chest pain thus a troponin and a D-dimer had been ordered, they were both elevated significantly with a D-dimer and less significantly for the troponin.  The patient is not able to give much in the way of information, she is pleasant, has a heart rate of about 100 and has a temperature of 100.0 degrees as well.  Oxygen is between 92 and 96%, lung sounds appear clear.  We will needed search for the source of the fever, we will also investigate the source of the elevated D-dimer and chest pain with pulmonary embolism work-up.   Troponin is elevated, large renal calculus, multiple consultants involving cardiology urology and critical care.  The patient will ultimately likely be admitted to the ICU.  She is critically ill and may in fact be too ill to have a diverting nephrostomy placed by interventional radiology.  CRITICAL CARE Performed by: Vida Roller Total critical care time: 35 minutes Critical care time was exclusive of separately billable procedures and treating other patients. Critical care was necessary to treat or prevent imminent or life-threatening deterioration. Critical care was time spent personally by me on the following activities: development of treatment plan with patient and/or surrogate as well as nursing, discussions with consultants, evaluation of patient's response to treatment, examination of patient, obtaining history from patient or surrogate, ordering and performing treatments and interventions, ordering and review of laboratory studies, ordering and review of radiographic studies, pulse oximetry and re-evaluation of patient's condition.     EKG Interpretation  Date/Time:  Friday February 03 2020 18:42:09  EST Ventricular Rate:  93 PR Interval:    QRS Duration: 140 QT Interval:  390 QTC Calculation: 486 R Axis:   16 Text Interpretation: Sinus rhythm Right bundle branch block Left ventricular hypertrophy Inferior infarct, acute Lateral leads are also involved possible ST elevatgion in the inferior leads Confirmed by Eber Hong ([id number]) on 02/03/2020 7:45:34 PM Also confirmed by Eber Hong ([id number]), editor Elita Quick ([id number])  on 02/04/2020 11:18:26 AM         I was personally present and directly supervised the following procedures:  Medical evaluation and resuscitation  I personally interpreted the EKG as well as the resident and agree with the interpretation on the resident's chart.  Final diagnoses:  Renal calculus  Severe sepsis Non-ST elevation MI    Eber Hong, MD 02/05/20 1443

## 2020-02-03 NOTE — ED Notes (Signed)
Pt sating 88% on RA. Pt placed on 2L [place]. Pt now sating 98%. Will continue to monitor.

## 2020-02-03 NOTE — Progress Notes (Signed)
Pharmacy Antibiotic Note  [name] is a 86 y.o. female admitted on 02/03/2020 with sepsis.  Pharmacy has been consulted for Vancomycin and Cefepime dosing.  Plan: Cefepime 2gm IV q24h Vancomycin 750 mg IV Q 48 hrs.  Will f/u renal function, micro data, and pt's clinical condition Vanc levels prn   Height: 5\' 4"  (162.6 cm) Weight: 59.2 kg (130 lb 8.2 oz) IBW/kg (Calculated) : 54.7  Temp (24hrs), Avg:101.2 F (38.4 C), Min:99.3 F (37.4 C), Max:104 F (40 C)  Recent Labs  Lab 02/03/20 1720  WBC 7.3  CREATININE 1.59*  LATICACIDVEN 3.8*    Estimated Creatinine Clearance: 19.1 mL/min (A) (by C-G formula based on SCr of 1.59 mg/dL (H)).    No Known Allergies  Antimicrobials this admission: 12/10 Vanc >>  12/10 Cefepime >>   Microbiology results: 12/10 BCx:  12/10 UCx:    Thank you for allowing pharmacy to be a part of this patient's care.  14/10, PharmD, BCPS Please see amion for complete clinical pharmacist phone list 02/03/2020 11:51 PM

## 2020-02-03 NOTE — ED Provider Notes (Addendum)
[facility] DEPART[facility] Provider Note   CSN: [id number] Arrival date & time: 02/03/20  1706     History Chief Complaint  Patient presents with  . abnormal labs    [name] is a 86 y.o. female with HTN, hypothyroidism presents from her facility with abnormal lab work.  Per EMS, patient started having chest pain about 2 days ago (Wednesday) and her facility obtained a D-dimer and a troponin.  Troponin 26, D-dimer 3000.  Lab work returned today prompting them to send the patient to the ED due to elevated blood work.  Per patient, she had chest pain 2 days ago without any clear aggravating or alleviating factors.  She is unable to further characterize the chest pain but states it has resolved at this time.  At this time she has abdominal pain, nausea/vomiting, and lower back pain.  Unable to characterize the back pain or describe any elevating or relieving factors but states that this is new/not chronic.  ROS also positive for cough and decreased urine output.  Denies shortness of breath.  The history is provided by the patient and the EMS personnel. The history is limited by the condition of the patient.       Past Medical History:  Diagnosis Date  . ARF (acute renal failure) ([place]) 11/2017  . Arthritis   . Carotid artery occlusion    Bruit found August 2009  . Hypertension   . Hypothyroidism    Hypothyroidism  . Macular degeneration of left eye   . Multiple falls 12/16/2017  . UTI (urinary tract infection) 12/16/2017    Patient Active Problem List   Diagnosis Date Noted  . Severe sepsis ([place]) 02/03/2020  . Stage II pressure ulcer of sacral region ([place]) 01/15/2018  . AKI (acute kidney injury) ([place]) 01/15/2018  . Sepsis ([place]) 01/14/2018  . Mechanical Fall 12/15/2017  . E coli UTI (urinary tract infection) 12/15/2017  . ARF (acute renal failure) ([place]) 12/15/2017  . Hypertension 12/15/2017  . Hypothyroidism 12/15/2017  . Hyperlipidemia 12/15/2017   . Occlusion and stenosis of carotid artery without mention of cerebral infarction 06/09/2011    Past Surgical History:  Procedure Laterality Date  . APPENDECTOMY    . CAROTID ENDARTERECTOMY  04/14/2008   Right  . CATARACT EXTRACTION    . LUMBAR LAMINECTOMY       OB History   No obstetric history on file.     Family History  Problem Relation Age of Onset  . Heart disease Mother        After age 72  . Stroke Mother   . Heart attack Mother   . Hypertension Mother   . Other Mother        Amputation of Leg- Because of a fall in Nursing Home.    Social History   Tobacco Use  . Smoking status: Never Smoker  . Smokeless tobacco: Never Used  Vaping Use  . Vaping Use: Never used  Substance Use Topics  . Alcohol use: No  . Drug use: No    Home Medications Prior to Admission medications   Medication Sig Start Date End Date Taking? Authorizing Provider  aspirin 81 MG tablet Take 81 mg by mouth daily.   Yes [provider]  atenolol (TENORMIN) 25 MG tablet Take 25 mg by mouth daily.   Yes [provider]  atorvastatin (LIPITOR) 20 MG tablet Take 20 mg by mouth daily.   Yes [provider]  Calcium-Vitamin D (CALTRATE 600  PLUS-VIT D PO) Take 2 tablets by mouth daily.    Yes [provider]  celecoxib (CELEBREX) 200 MG capsule Take 200 mg by mouth 2 (two) times daily.   Yes [provider]  cetirizine (ZYRTEC) 10 MG tablet Take 10 mg by mouth daily.   Yes [provider]  docusate sodium (COLACE) 100 MG capsule Take 100 mg by mouth 2 (two) times daily.   Yes [provider]  levothyroxine (SYNTHROID, LEVOTHROID) 75 MCG tablet Take 75 mcg by mouth daily before breakfast.    Yes [provider]  melatonin 3 MG TABS tablet Take 6 mg by mouth at bedtime.   Yes [provider]  Menthol, Topical Analgesic, (BIOFREEZE) 4 % GEL Apply 1 application topically in the morning and at bedtime.   Yes [provider]  Polyethyl Glycol-Propyl Glycol (SYSTANE) 0.4-0.3 % GEL ophthalmic gel Place 1 application into both eyes in the morning, at noon, in the evening, and at bedtime.   Yes [provider]  pramipexole (MIRAPEX) 0.125 MG tablet Take 0.125 mg by mouth at bedtime.   Yes [provider]  traMADol (ULTRAM) 50 MG tablet Take 50 mg by mouth every 4 (four) hours as needed for moderate pain.   Yes [provider]  acetaminophen (TYLENOL) 325 MG tablet Take 2 tablets (650 mg total) by mouth every 6 (six) hours as needed for mild pain or moderate pain (or Fever >/= 101). 01/20/18   Hongalgi, Lenis Dickinson, MD    Allergies    Patient has no known allergies.  Review of Systems   Review of Systems  Constitutional: Positive for fatigue and fever.  Respiratory: Positive for cough.   Cardiovascular: Positive for chest pain.  Gastrointestinal: Positive for abdominal pain, nausea and vomiting. Negative for constipation and diarrhea.  Genitourinary: Positive for decreased urine volume. Negative for difficulty urinating, frequency and urgency.       No urinary retention, no bladder/bowel incontinence.  Neurological:       No saddle anesthesia.  All other systems reviewed and are negative.   Physical Exam Updated Vital Signs BP (!) 108/45   Pulse 83   Temp 99.3 F (37.4 C) (Oral)   Resp (!) 23   Ht _0  (1.626 m)   Wt 59.2 kg   SpO2 95%   BMI 22.40 kg/m   Physical Exam Vitals and nursing note reviewed.  Constitutional:      General: She is not in acute distress.    Appearance: She is well-developed and well-nourished. She is ill-appearing. She is not toxic-appearing.  HENT:     Head: Normocephalic and atraumatic.     Right Ear: External ear normal.     Left Ear: External ear normal.     Nose: Nose normal. No rhinorrhea.  Eyes:     General: No scleral icterus.    Conjunctiva/sclera: Conjunctivae normal.  Cardiovascular:     Rate and Rhythm: Normal rate and  regular rhythm.     Heart sounds: No murmur heard.   Pulmonary:     Effort: Pulmonary effort is normal. No respiratory distress.     Breath sounds: Normal breath sounds.  Abdominal:     General: There is distension (mild).     Palpations: Abdomen is soft.     Tenderness: There is abdominal tenderness.  Musculoskeletal:        General: No deformity, signs of injury or edema.     Cervical back: Neck supple.  Lumbar back: Tenderness present. No swelling, edema, deformity or signs of trauma.     Right lower leg: No edema.     Left lower leg: No edema.  Skin:    General: Skin is warm and dry.  Neurological:     Mental Status: She is alert and oriented to person, place, and time.  Psychiatric:        Mood and Affect: Mood and affect normal.     ED Results / Procedures / Treatments   Labs (all labs ordered are listed, but only abnormal results are displayed) Labs Reviewed  LACTIC ACID, PLASMA - Abnormal; Notable for the following components:      Result Value   Lactic Acid, Venous 3.8 (*)    All other components within normal limits  COMPREHENSIVE METABOLIC PANEL - Abnormal; Notable for the following components:   CO2 20 (*)    Glucose, Bld 110 (*)    BUN 37 (*)    Creatinine, Ser 1.59 (*)    Calcium 8.8 (*)    Albumin 2.6 (*)    GFR, Estimated 30 (*)    All other components within normal limits  CBC WITH DIFFERENTIAL/PLATELET - Abnormal; Notable for the following components:   Hemoglobin 11.8 (*)    Abs Immature Granulocytes 0.33 (*)    All other components within normal limits  URINALYSIS, ROUTINE W REFLEX MICROSCOPIC - Abnormal; Notable for the following components:   APPearance CLOUDY (*)    Hgb urine dipstick MODERATE (*)    Protein, ur 100 (*)    Nitrite POSITIVE (*)    Leukocytes,Ua LARGE (*)    WBC, UA >50 (*)    Bacteria, UA MANY (*)    All other components within normal limits  TROPONIN I (HIGH SENSITIVITY) - Abnormal; Notable for the following  components:   Troponin I (High Sensitivity) 187 (*)    All other components within normal limits  TROPONIN I (HIGH SENSITIVITY) - Abnormal; Notable for the following components:   Troponin I (High Sensitivity) 1,988 (*)    All other components within normal limits  RESP PANEL BY RT-PCR (FLU A&B, COVID) ARPGX2  CULTURE, BLOOD (ROUTINE X 2)  CULTURE, BLOOD (ROUTINE X 2)  URINE CULTURE  PROTIME-INR  APTT  LACTIC ACID, PLASMA  LACTIC ACID, PLASMA  CBC  BASIC METABOLIC PANEL  MAGNESIUM  PHOSPHORUS  TROPONIN I (HIGH SENSITIVITY)    EKG EKG Interpretation  Date/Time:  Friday February 03 2020 18:42:09 EST Ventricular Rate:  93 PR Interval:    QRS Duration: 140 QT Interval:  390 QTC Calculation: 486 R Axis:   16 Text Interpretation: Sinus rhythm Right bundle branch block Left ventricular hypertrophy Inferior infarct, acute Lateral leads are also involved possible ST elevatgion in the inferior leads Confirmed by Noemi Chapel [id number]) on 02/03/2020 7:45:34 PM   Radiology CT ABDOMEN PELVIS W CONTRAST  Result Date: 02/03/2020 CLINICAL DATA:  Abdominal pain, fever EXAM: CT ABDOMEN AND PELVIS WITH CONTRAST TECHNIQUE: Multidetector CT imaging of the abdomen and pelvis was performed using the standard protocol following bolus administration of intravenous contrast. CONTRAST:  86m OMNIPAQUE IOHEXOL 300 MG/ML  SOLN COMPARISON:  None. FINDINGS: Lower chest: Small left pleural effusion and trace right pleural effusion. Dependent bibasilar atelectasis. Cardiomegaly. Densely calcified mitral valve, aortic valve, visualized coronary arteries and aorta. Hepatobiliary: No focal hepatic abnormality. Gallbladder unremarkable. Pancreas: No focal abnormality or ductal dilatation. Spleen: Calcifications in the spleen likely due to old insult. Normal size. Adrenals/Urinary Tract: Large left renal  pelvic stone measures up to 1.6 cm. Mild hydronephrosis associated with this stone. Nonobstructing midpole right  renal stone. Other nonobstructing stones on the left. No ureteral stones. Adrenal glands and urinary bladder unremarkable. Upper pole cyst on the right measures 4.7 cm. Stomach/Bowel: Sigmoid diverticulosis. No active diverticulitis. Scattered diverticula also noted in the transverse colon and right colon. Moderate stool burden in the rectosigmoid colon. Stomach and small bowel decompressed, unremarkable. Vascular/Lymphatic: Aortoiliac atherosclerosis. No evidence of aneurysm or adenopathy. Reproductive: Calcified fibroids within the uterus. No adnexal mass. Other: No free fluid or free air. Musculoskeletal: No acute bony abnormality. IMPRESSION: Bilateral nephrolithiasis. 16 mm left renal pelvic stone with mild left hydronephrosis. Aortoiliac atherosclerosis.  Coronary artery disease. Colonic diverticulosis.  No active diverticulitis. Moderate stool in the rectosigmoid colon. Small left pleural effusion and trace right pleural effusion. Electronically Signed   By: Rolm Baptise M.D.   On: 02/03/2020 20:05   DG Chest Port 1 View  Result Date: 02/03/2020 CLINICAL DATA:  Questionable sepsis. EXAM: PORTABLE CHEST 1 VIEW COMPARISON:  January 14, 2018 FINDINGS: The lungs are hyperinflated. There is no evidence of acute infiltrate, pleural effusion or pneumothorax. The cardiac silhouette is mildly enlarged and unchanged in size. There is marked severity calcification of the aortic arch. Degenerative changes seen throughout the thoracic spine. IMPRESSION: Stable exam without acute or active cardiopulmonary disease. Electronically Signed   By: Virgina Norfolk M.D.   On: 02/03/2020 18:21    Procedures Procedures (including critical care time)  Medications Ordered in ED Medications  norepinephrine (LEVOPHED) 41m in 252mpremix infusion (0 mcg/min Intravenous Stopped 02/03/20 2350)  docusate sodium (COLACE) capsule 100 mg (has no administration in time range)  polyethylene glycol (MIRALAX / GLYCOLAX) packet  17 g (has no administration in time range)  lactated ringers infusion (has no administration in time range)  pantoprazole (PROTONIX) injection 40 mg (has no administration in time range)  heparin injection 5,000 Units (has no administration in time range)  ceFEPIme (MAXIPIME) 2 g in sodium chloride 0.9 % 100 mL IVPB (has no administration in time range)  vancomycin (VANCOREADY) IVPB 750 mg/150 mL (has no administration in time range)  lidocaine (XYLOCAINE) 1 % (with pres) injection (has no administration in time range)  lactated ringers bolus 1,000 mL (0 mLs Intravenous Stopped 02/03/20 2003)  ondansetron (ZOFRAN) injection 4 mg (4 mg Intravenous Given 02/03/20 1800)  acetaminophen (TYLENOL) suppository 650 mg (650 mg Rectal Given 02/03/20 1802)  vancomycin (VANCOREADY) IVPB 1250 mg/250 mL (0 mg Intravenous Stopped 02/03/20 2118)  ceFEPIme (MAXIPIME) 2 g in sodium chloride 0.9 % 100 mL IVPB (0 g Intravenous Stopped 02/03/20 1923)  metroNIDAZOLE (FLAGYL) IVPB 500 mg (0 mg Intravenous Stopped 02/03/20 2005)  iohexol (OMNIPAQUE) 300 MG/ML solution 75 mL (75 mLs Intravenous Contrast Given 02/03/20 1954)  aspirin suppository 300 mg (300 mg Rectal Given 02/03/20 2013)  lactated ringers bolus 776 mL (0 mLs Intravenous Stopped 02/03/20 2209)  midazolam (VERSED) injection ( Intravenous Canceled Entry 02/04/20 0030)  fentaNYL (SUBLIMAZE) injection ( Intravenous Canceled Entry 02/04/20 0030)  iohexol (OMNIPAQUE) 300 MG/ML solution 50 mL (10 mLs Per Tube Contrast Given 02/04/20 0101)  lidocaine (XYLOCAINE) 1 % (with pres) injection (10 mLs Infiltration Given 02/04/20 0031)    ED Course  I have reviewed the triage vital signs and the nursing notes.  Pertinent labs & imaging results that were available during my care of the patient were reviewed by me and considered in my medical decision making (see chart for details).  MDM Rules/Calculators/A&P                          MDM: Antonetta Clanton is a 86  y.o. female who presents with abnormal lab work as per above. I have reviewed the nursing documentation for past medical history, family history, and social history. Pertinent previous records reviewed. She is awake, alert. HDS. Febrile to 104 degrees F. Physical exam is most notable for elderly, chronically ill-appearing 86 year old female with tachypnea.  Labs: Lactic 3.8.  Bicarb 20, BUN 37, creatinine 1.6 (baseline ~1.0).  WBC normal at 7.3, hemoglobin 11.8, CBC otherwise unremarkable.  Covid negative.  Blood culture pending x2. EKG: Normal sinus rhythm, ventricular rate 93, QRS 140, QTc 46.  Left axis deviation.  Right bundle branch block.  Nonspecific ST changes in the inferior leads as well as slurring of the S wave in precordial leads. No STEMI. No evidence of a High-Grade Conduction Block, WPW, Brugada Sign, ARVC, DeWinters T Waves, or Wellens Waves. Imaging: CXR demonstrating no acute cardiopulmonary abnormality. Consults: Cardiology Tx: 1 L LR, 653m Tylenol suppository, 4 mg IV Zofran.  Vancomycin, cefepime, and Flagyl given.  Differential Dx: I am most concerned for septic shock secondary to infected nephrolithiasis.  I am also concerned that she now has gram-negative bacteremia.  Clinical presentation complicated by NSTEMI.  Given history, physical exam, and work-up, I do not think she has PE, pneumonia, hemorrhage, or trauma.  MDM: [name] a 86y.o. female presents with abnormal lab work found to be febrile and developing septic shock.  Patient met SIRS criteria with fever and heart rate of 94 although no obvious source of infection.  Patient does have abdominal pain and vomiting so CT abdomen/pelvis ordered.  Patient covered empirically with vancomycin and cefepime.  Flagyl also given due to patient's abdominal pain and possible intra-abdominal source.  As patient did not initially meet sepsis criteria due to no known source of infection and has been normotensive, did not order sepsis  fluids with 30 cc/kg bolus.  Patient given 1 L and has remained stable.  Did  not feel additional fluids were indicated initially.  However, patient's pressures down trended and she became hypotensive.  Given remaining 776 cc of her 30 cc/kg bolus and started on Levophed with a map goal of 60 or above.  On reassessment, patient overall seems to have improved since she initially arrived, mentating well.  Patient seems to understand that she is being admitted.  5:31PM Spoke with Dr. KClaiborne Billingswith Cards regarding patient and concerning EKG. No STEMI but recommend trops and repeat EKG. Repeat EKG similar to prior. Patient not having chest pain but troponin resulted at 187.  Feel that this is likely related to her AKI.  Rectal aspirin ordered.  10:25PM - spoke with Dr. PClaudia Desanctiswith Urology who recommended nephrostomy tube placement.  Per Dr. PClaudia Desanctis she will speak with IR regarding neph tube as she does not feel that patient is stable enough (given NSTEMI) for stent  10:28 PM -troponin returned at 2000, cardiology repaged.   10:33PM -Spoke with Cardiologist Dr. NMarcelle Smilingwho recommended holding on heparin and further cardiac work-up until she receives definitive management for her infected nephrolithiasis.  Handoff given to critical care physician Dr. DEarlie Serverwho will see the patient in the ED.  Patient on 1 mcg levo at time of handoff and admission.  In stable condition at time of admission.  The plan for this patient was discussed  with Dr. Sabra Heck, who voiced agreement and who oversaw evaluation and treatment of this patient.   Final Clinical Impression(s) / ED Diagnoses Final diagnoses:  Renal calculus    Rx / DC Orders ED Discharge Orders    None       Darrick Huntsman, MD 02/04/20 0103    Darrick Huntsman, MD 02/04/20 1523    Noemi Chapel, MD 02/05/20 1442

## 2020-02-03 NOTE — ED Notes (Signed)
Dr. Hyacinth Meeker messaged via Epic and made aware of patient's BP readings/two readings with MAP <65. Awaiting further orders at this time.

## 2020-02-03 NOTE — ED Notes (Signed)
Patient back from CT.

## 2020-02-03 NOTE — Sepsis Progress Note (Signed)
Sepsis protocol is being followed by [facility]. 

## 2020-02-03 NOTE — ED Notes (Signed)
Tresa Endo, [place], called and was updated on patient status.

## 2020-02-03 NOTE — H&P (Signed)
NAME:  [name], MRN:  [id number], DOB:  1924/05/01, LOS: 0 ADMISSION DATE:  02/03/2020,, CHIEF COMPLAINT:   Brief History   86 y/o presented with severe sepsis from renal calculi.  History of present illness   86y/o female that presented from nsg home for elevated trop.  The pt denied all physical complaints but was found to have temp of 104.  She is awake and alert but gets confused with timing and duration of events.  She states that has not had SOB/CP/ chest pressure/ vomiting/ abd pain/ diarrhea.  Pt was found to have renal calculi on CT of the abd. She denies dysuria.  Attempted to reach the POA but not able to.  The pt had had trop sent two days ago for chest pressure at that time but she denies that.    Past Medical History  Arthritis HTN Hypothyroidism Macular degeneration   Consults:  Urology Cardiology  Procedures:  None   Significant Diagnostic Tests:  CT Abd Bilateral nephrolithiasis. 16 mm left renal pelvic stone with mild left hydronephrosis. Aortoiliac atherosclerosis.  Coronary artery disease. Colonic diverticulosis.  No active diverticulitis. Moderate stool in the rectosigmoid colon. Small left pleural effusion and trace right pleural effusion.  Micro Data:  Covid: negative Flu: negative  Antimicrobials:  Vancomycin Cefepime    Objective   Blood pressure (!) 110/50, pulse 73, temperature 99.3 F (37.4 C), temperature source Oral, resp. rate (!) 27, height 5\' 4"  (1.626 m), weight 59.2 kg, SpO2 96 %.        Intake/Output Summary (Last 24 hours) at 02/03/2020 2355 Last data filed at 02/03/2020 2005 Gross per 24 hour  Intake --  Output 50 ml  Net -50 ml   Filed Weights   02/03/20 1828  Weight: 59.2 kg    Examination: General: No acute distress HENT: Atraumatic Norm  Lungs: CTAB no wheezing rales or rhonchi Cardiovascular:RR  4/6 SEM no rub or gallop Abdomen: soft inconsistent right lower tenderness. No rebound or  guarding Extremities: No edema or cyanosis  Arthritic changes of the dip bilateral hands Neuro: awake alert mildly confused about the date and her age.  Motor grossly intact  GU: Pure wick in place   Assessment & Plan:  Severe Sepsis UTI Hydronephrosis Renal Calculi NSTEMI AKI  Plan: Admit to the ICU for further workup Appreciate urology input Heading to IR for perc drain Echo ordered Trend Trop Wean levophed to off to keep the MAP>60 Consult Cardiology LR IVF 70ml/hr Monitor I/O Cefepime and Vanco for empiric coverage    Best practice (evaluated daily)   Diet: NPO Pain/Anxiety/Delirium protocol (if indicated): N/A VAP protocol (if indicated): N/A DVT prophylaxis: Heparin GI prophylaxis: protonix Glucose control: monitor BS Mobility: bedrest Code Status: full code Disposition: admit to the ICU  Labs   CBC: Recent Labs  Lab 02/03/20 1720  WBC 7.3  NEUTROABS 6.0  HGB 11.8*  HCT 36.8  MCV 92.5  PLT 214    Basic Metabolic Panel: Recent Labs  Lab 02/03/20 1720  NA 141  K 4.5  CL 106  CO2 20*  GLUCOSE 110*  BUN 37*  CREATININE 1.59*  CALCIUM 8.8*   GFR: Estimated Creatinine Clearance: 19.1 mL/min (A) (by C-G formula based on SCr of 1.59 mg/dL (H)). Recent Labs  Lab 02/03/20 1720  WBC 7.3  LATICACIDVEN 3.8*    Liver Function Tests: Recent Labs  Lab 02/03/20 1720  AST 24  ALT 17  ALKPHOS 104  BILITOT 0.9  PROT 7.4  ALBUMIN 2.6*   No results for input(s): LIPASE, AMYLASE in the last 168 hours. No results for input(s): AMMONIA in the last 168 hours.  ABG No results found for: PHART, PCO2ART, PO2ART, HCO3, TCO2, ACIDBASEDEF, O2SAT   Coagulation Profile: Recent Labs  Lab 02/03/20 1720  INR 1.2    Cardiac Enzymes: No results for input(s): CKTOTAL, CKMB, CKMBINDEX, TROPONINI in the last 168 hours.  HbA1C: No results found for: HGBA1C  CBG: No results for input(s): GLUCAP in the last 168 hours.  Review of Systems:   10  point of systems completed.  She denies all physical complaints   Past Medical History  She,  has a past medical history of ARF (acute renal failure) ([facility]) (11/2017), Arthritis, Carotid artery occlusion, Hypertension, Hypothyroidism, Macular degeneration of left eye, Multiple falls (12/16/2017), and UTI (urinary tract infection) (12/16/2017).   Surgical History    Past Surgical History:  Procedure Laterality Date   APPENDECTOMY     CAROTID ENDARTERECTOMY  04/14/2008   Right   CATARACT EXTRACTION     LUMBAR LAMINECTOMY       Social History   reports that she has never smoked. She has never used smokeless tobacco. She reports that she does not drink alcohol and does not use drugs.   Family History   Her family history includes Heart attack in her mother; Heart disease in her mother; Hypertension in her mother; Other in her mother; Stroke in her mother.   Allergies No Known Allergies   Home Medications  Prior to Admission medications   Medication Sig Start Date End Date Taking? Authorizing Provider  aspirin 81 MG tablet Take 81 mg by mouth daily.   Yes [provider]  atenolol (TENORMIN) 25 MG tablet Take 25 mg by mouth daily.   Yes [provider]  atorvastatin (LIPITOR) 20 MG tablet Take 20 mg by mouth daily.   Yes [provider]  Calcium-Vitamin D (CALTRATE 600 PLUS-VIT D PO) Take 2 tablets by mouth daily.    Yes [provider]  celecoxib (CELEBREX) 200 MG capsule Take 200 mg by mouth 2 (two) times daily.   Yes [provider]  cetirizine (ZYRTEC) 10 MG tablet Take 10 mg by mouth daily.   Yes [provider]  docusate sodium (COLACE) 100 MG capsule Take 100 mg by mouth 2 (two) times daily.   Yes [provider]  levothyroxine (SYNTHROID, LEVOTHROID) 75 MCG tablet Take 75 mcg by mouth daily before breakfast.    Yes [provider]  melatonin 3 MG TABS tablet Take 6 mg by mouth at bedtime.   Yes [provider]  Menthol, Topical Analgesic, (BIOFREEZE) 4 % GEL Apply 1 application topically in the morning and at bedtime.   Yes [provider]  Polyethyl Glycol-Propyl Glycol (SYSTANE) 0.4-0.3 % GEL ophthalmic gel Place 1 application into both eyes in the morning, at noon, in the evening, and at bedtime.   Yes [provider]  pramipexole (MIRAPEX) 0.125 MG tablet Take 0.125 mg by mouth at bedtime.   Yes [provider]  traMADol (ULTRAM) 50 MG tablet Take 50 mg by mouth every 4 (four) hours as needed for moderate pain.   Yes [provider]  acetaminophen (TYLENOL) 325 MG tablet Take 2 tablets (650 mg total) by mouth every 6 (six) hours as needed for mild pain or moderate pain (or Fever >/= 101). 01/20/18   [name], MD     Critical care time:  86y/o

## 2020-02-03 NOTE — ED Triage Notes (Signed)
Pt BIB [facility] from [place] at [place] for abnormal labs. Pt was c/o of CP Wednesday so facility did lab work. Lab work came back today D-dimer and Trop were elevated so facility request pt be sent to the hospital. Pt denies CP but states she has back pain.

## 2020-02-03 NOTE — Consult Note (Signed)
I have been asked to see the patient by Dr. Eber Hong, for evaluation and management of 16 mm left renal pelvis calculus.  History of present illness:  86 yo woman who presented to ED with abdominal pain and CP found to have ECG changes and elevated troponins concerning for NSTEMI as well as fever 104 associated with 16 mm left renal pelvis stone with mild hydronephrosis.  UA obtained concerning for infection but is pure wick collection.  Patient denies any abdominal pain or prior history of stones (however I had woken her up in ED and she seemed very tired).  No other source for sepsis seen.     Review of systems: A 12 point comprehensive review of systems was obtained and is negative unless otherwise stated in the history of present illness.  Patient Active Problem List   Diagnosis Date Noted  . Stage II pressure ulcer of sacral region (HCC) 01/15/2018  . AKI (acute kidney injury) (HCC) 01/15/2018  . Sepsis (HCC) 01/14/2018  . Mechanical Fall 12/15/2017  . E coli UTI (urinary tract infection) 12/15/2017  . ARF (acute renal failure) (HCC) 12/15/2017  . Hypertension 12/15/2017  . Hypothyroidism 12/15/2017  . Hyperlipidemia 12/15/2017  . Occlusion and stenosis of carotid artery without mention of cerebral infarction 06/09/2011    No current facility-administered medications on file prior to encounter.   Current Outpatient Medications on File Prior to Encounter  Medication Sig Dispense Refill  . aspirin 81 MG tablet Take 81 mg by mouth daily.    Marland Kitchen atenolol (TENORMIN) 25 MG tablet Take 25 mg by mouth daily.    Marland Kitchen atorvastatin (LIPITOR) 20 MG tablet Take 20 mg by mouth daily.    . Calcium-Vitamin D (CALTRATE 600 PLUS-VIT D PO) Take 2 tablets by mouth daily.     . celecoxib (CELEBREX) 200 MG capsule Take 200 mg by mouth 2 (two) times daily.    . cetirizine (ZYRTEC) 10 MG tablet Take 10 mg by mouth daily.    Marland Kitchen docusate sodium (COLACE) 100 MG capsule Take 100 mg by mouth 2 (two) times  daily.    Marland Kitchen levothyroxine (SYNTHROID, LEVOTHROID) 75 MCG tablet Take 75 mcg by mouth daily before breakfast.     . melatonin 3 MG TABS tablet Take 6 mg by mouth at bedtime.    . Menthol, Topical Analgesic, (BIOFREEZE) 4 % GEL Apply 1 application topically in the morning and at bedtime.    Bertram Gala Glycol-Propyl Glycol (SYSTANE) 0.4-0.3 % GEL ophthalmic gel Place 1 application into both eyes in the morning, at noon, in the evening, and at bedtime.    . pramipexole (MIRAPEX) 0.125 MG tablet Take 0.125 mg by mouth at bedtime.    . traMADol (ULTRAM) 50 MG tablet Take 50 mg by mouth every 4 (four) hours as needed for moderate pain.    Marland Kitchen acetaminophen (TYLENOL) 325 MG tablet Take 2 tablets (650 mg total) by mouth every 6 (six) hours as needed for mild pain or moderate pain (or Fever >/= 101).      Past Medical History:  Diagnosis Date  . ARF (acute renal failure) (HCC) 11/2017  . Arthritis   . Carotid artery occlusion    Bruit found August 2009  . Hypertension   . Hypothyroidism    Hypothyroidism  . Macular degeneration of left eye   . Multiple falls 12/16/2017  . UTI (urinary tract infection) 12/16/2017    Past Surgical History:  Procedure Laterality Date  . APPENDECTOMY    .  CAROTID ENDARTERECTOMY  04/14/2008   Right  . CATARACT EXTRACTION    . LUMBAR LAMINECTOMY      Social History   Tobacco Use  . Smoking status: Never Smoker  . Smokeless tobacco: Never Used  Vaping Use  . Vaping Use: Never used  Substance Use Topics  . Alcohol use: No  . Drug use: No    Family History  Problem Relation Age of Onset  . Heart disease Mother        After age 54  . Stroke Mother   . Heart attack Mother   . Hypertension Mother   . Other Mother        Amputation of Leg- Because of a fall in Nursing Home.    PE: Vitals:   02/03/20 2135 02/03/20 2145 02/03/20 2200 02/03/20 2215  BP: (!) 138/52 (!) 165/46 (!) 115/48 (!) 104/46  Pulse: 86 73 72 71  Resp: (!) 27 19 20 18   Temp:       TempSrc:      SpO2: 97% 97% 97% 97%  Weight:      Height:       Patient appears to be in no acute distress  patient is alert and oriented x3 Atraumatic normocephalic head No cervical or supraclavicular lymphadenopathy appreciated No increased work of breathing, no audible wheezes/rhonchi Regular sinus rhythm/rate Abdomen is soft, nontender, nondistended, no CVA or suprapubic tenderness Lower extremities are symmetric without appreciable edema Grossly neurologically intact No identifiable skin lesions  Recent Labs    02/03/20 1720  WBC 7.3  HGB 11.8*  HCT 36.8   Recent Labs    02/03/20 1720  NA 141  K 4.5  CL 106  CO2 20*  GLUCOSE 110*  BUN 37*  CREATININE 1.59*  CALCIUM 8.8*   Recent Labs    02/03/20 1720  INR 1.2   No results for input(s): LABURIN in the last 72 hours. Results for orders placed or performed during the hospital encounter of 02/03/20  Resp Panel by RT-PCR (Flu A&B, Covid) Nasopharyngeal Swab     Status: None   Collection Time: 02/03/20  5:24 PM   Specimen: Nasopharyngeal Swab; Nasopharyngeal(NP) swabs in vial transport medium  Result Value Ref Range Status   SARS Coronavirus 2 by RT PCR NEGATIVE NEGATIVE Final    Comment: (NOTE) SARS-CoV-2 target nucleic acids are NOT DETECTED.  The SARS-CoV-2 RNA is generally detectable in upper respiratory specimens during the acute phase of infection. The lowest concentration of SARS-CoV-2 viral copies this assay can detect is 138 copies/mL. A negative result does not preclude SARS-Cov-2 infection and should not be used as the sole basis for treatment or other patient management decisions. A negative result may occur with  improper specimen collection/handling, submission of specimen other than nasopharyngeal swab, presence of viral mutation(s) within the areas targeted by this assay, and inadequate number of viral copies(<138 copies/mL). A negative result must be combined with clinical observations,  patient history, and epidemiological information. The expected result is Negative.  Fact Sheet for Patients:  14/10/21  Fact Sheet for Healthcare Providers:  BloggerCourse.com  This test is no t yet approved or cleared by the [employer] FDA and  has been authorized for detection and/or diagnosis of SARS-CoV-2 by FDA under an Emergency Use Authorization (EUA). This EUA will remain  in effect (meaning this test can be used) for the duration of the COVID-19 declaration under Section 564(b)(1) of the Act, 21 U.S.C.section 360bbb-3(b)(1), unless the authorization is terminated  or revoked sooner.       Influenza A by PCR NEGATIVE NEGATIVE Final   Influenza B by PCR NEGATIVE NEGATIVE Final    Comment: (NOTE) The Xpert Xpress SARS-CoV-2/FLU/RSV plus assay is intended as an aid in the diagnosis of influenza from Nasopharyngeal swab specimens and should not be used as a sole basis for treatment. Nasal washings and aspirates are unacceptable for Xpert Xpress SARS-CoV-2/FLU/RSV testing.  Fact Sheet for Patients: BloggerCourse.com  Fact Sheet for Healthcare Providers: [employer]ious[employer]ker[employer]it  This test is not yet approved or cleared by the [place] FDA and has been authorized for detection and/or diagnosis of SARS-CoV-2 by FDA under an Emergency Use Authorization (EUA). This EUA will remain in effect (meaning this test can be used) for the duration of the COVID-19 declaration under Section 564(b)(1) of the Act, 21 U.S.C. section 360bbb-3(b)(1), unless the authorization is terminated or revoked.  Performed at [facility], [address], [place], [place] [address]     Imaging: CT Abd/Pelvis IMPRESSION: Bilateral nephrolithiasis. 16 mm left renal pelvic stone with mild left hydronephrosis.  Aortoiliac atherosclerosis.  Coronary artery disease.  Colonic  diverticulosis.  No active diverticulitis.  Moderate stool in the rectosigmoid colon.  Small left pleural effusion and trace right pleural effusion.   Electronically Signed   By: Charlett Nose M.D.   On: 02/03/2020 20:05  A/P: 86 yo woman who presented to ED with abdominal pain and CP found to have ECG changes concerning for NSTEMI as well as fever to 104 and 16 mm LEFT renal pelvis stone with hydro and UA concerning for infection.    1. Left renal calculus with clinical sepsis: recommend LEFT PCN tube placement however hydronephrosis very mild and this would be technically difficult.  Patient is currently on levophed without any other source of infection. Recommend continued IV abx with gram negative coverage.  Have spoken with IR and they are reviewing images and considering case.     Thank you for involving me in this patient's care.  Please page with any further questions or concerns. Jenevie Casstevens D Vestal Crandall

## 2020-02-03 NOTE — ED Notes (Signed)
Patient voided 50CC of urine, specimen collected via PureWick. Urine clear and light yellow. In and out cath not performed r/t same.

## 2020-02-03 NOTE — ED Notes (Signed)
Patient to CT.

## 2020-02-04 ENCOUNTER — Inpatient Hospital Stay (HOSPITAL_COMMUNITY): Payer: Medicare Other

## 2020-02-04 DIAGNOSIS — I340 Nonrheumatic mitral (valve) insufficiency: Secondary | ICD-10-CM

## 2020-02-04 DIAGNOSIS — I350 Nonrheumatic aortic (valve) stenosis: Secondary | ICD-10-CM

## 2020-02-04 DIAGNOSIS — I351 Nonrheumatic aortic (valve) insufficiency: Secondary | ICD-10-CM

## 2020-02-04 DIAGNOSIS — R079 Chest pain, unspecified: Secondary | ICD-10-CM

## 2020-02-04 DIAGNOSIS — A419 Sepsis, unspecified organism: Secondary | ICD-10-CM

## 2020-02-04 HISTORY — PX: IR NEPHROSTOMY PLACEMENT LEFT: IMG6063

## 2020-02-04 LAB — BLOOD CULTURE ID PANEL (REFLEXED) - BCID2

## 2020-02-04 LAB — CBC
HCT: 34.6 % — ABNORMAL LOW (ref 36.0–46.0)
Hemoglobin: 10.5 g/dL — ABNORMAL LOW (ref 12.0–15.0)
MCH: 28.5 pg (ref 26.0–34.0)
MCHC: 30.3 g/dL (ref 30.0–36.0)
MCV: 93.8 fL (ref 80.0–100.0)
Platelets: 203 10*3/uL (ref 150–400)
RBC: 3.69 MIL/uL — ABNORMAL LOW (ref 3.87–5.11)
RDW: 13.8 % (ref 11.5–15.5)
WBC: 32.2 10*3/uL — ABNORMAL HIGH (ref 4.0–10.5)
nRBC: 0 % (ref 0.0–0.2)

## 2020-02-04 LAB — ECHOCARDIOGRAM COMPLETE
AR max vel: 0.61 cm2
AV Area VTI: 0.45 cm2
AV Area mean vel: 0.52 cm2
AV Mean grad: 29 mmHg
AV Peak grad: 49.3 mmHg
Ao pk vel: 3.51 m/s
Area-P 1/2: 3.42 cm2
Height: 64 in
P 1/2 time: 406 msec
S' Lateral: 3.1 cm
Weight: 2031.76 oz

## 2020-02-04 LAB — BASIC METABOLIC PANEL
Anion gap: 12 (ref 5–15)
BUN: 38 mg/dL — ABNORMAL HIGH (ref 8–23)
CO2: 21 mmol/L — ABNORMAL LOW (ref 22–32)
Calcium: 8.5 mg/dL — ABNORMAL LOW (ref 8.9–10.3)
Chloride: 105 mmol/L (ref 98–111)
Creatinine, Ser: 1.64 mg/dL — ABNORMAL HIGH (ref 0.44–1.00)
GFR, Estimated: 29 mL/min — ABNORMAL LOW (ref >60)
Glucose, Bld: 131 mg/dL — ABNORMAL HIGH (ref 70–99)
Potassium: 4.6 mmol/L (ref 3.5–5.1)
Sodium: 138 mmol/L (ref 135–145)

## 2020-02-04 LAB — LACTIC ACID, PLASMA
Lactic Acid, Venous: 1.9 mmol/L (ref 0.5–1.9)
Lactic Acid, Venous: 2.3 mmol/L (ref 0.5–1.9)

## 2020-02-04 LAB — MAGNESIUM: Magnesium: 1.9 mg/dL (ref 1.7–2.4)

## 2020-02-04 LAB — TROPONIN I (HIGH SENSITIVITY): Troponin I (High Sensitivity): 3587 ng/L (ref <18)

## 2020-02-04 LAB — MRSA PCR SCREENING: MRSA by PCR: NEGATIVE

## 2020-02-04 LAB — PHOSPHORUS: Phosphorus: 3.4 mg/dL (ref 2.5–4.6)

## 2020-02-04 MED ORDER — ATENOLOL 25 MG PO TABS: 25.0000 mg | ORAL_TABLET | Freq: Every day | ORAL | Status: DC

## 2020-02-04 MED ORDER — MIDAZOLAM HCL 2 MG/2ML IJ SOLN
INTRAMUSCULAR | Status: AC
  Filled 2020-02-04: qty 4

## 2020-02-04 MED ORDER — IOHEXOL 300 MG/ML  SOLN
50.0000 mL | Freq: Once | INTRAMUSCULAR | Status: AC | PRN
  Administered 2020-02-04: 10 mL

## 2020-02-04 MED ORDER — FENTANYL CITRATE (PF) 100 MCG/2ML IJ SOLN
INTRAMUSCULAR | Status: AC
  Filled 2020-02-04: qty 2

## 2020-02-04 MED ORDER — LEVOTHYROXINE SODIUM 75 MCG PO TABS
75.0000 ug | ORAL_TABLET | Freq: Every day | ORAL | Status: DC
  Administered 2020-02-05 – 2020-02-09 (×5): 75 ug via ORAL
  Filled 2020-02-04 (×5): qty 1

## 2020-02-04 MED ORDER — CLOPIDOGREL BISULFATE 75 MG PO TABS
75.0000 mg | ORAL_TABLET | Freq: Every day | ORAL | Status: DC
  Administered 2020-02-05 – 2020-02-09 (×5): 75 mg via ORAL
  Filled 2020-02-04 (×5): qty 1

## 2020-02-04 MED ORDER — MIDAZOLAM HCL 2 MG/2ML IJ SOLN
INTRAMUSCULAR | Status: AC | PRN
  Administered 2020-02-04: 1 mg via INTRAVENOUS

## 2020-02-04 MED ORDER — CHLORHEXIDINE GLUCONATE CLOTH 2 % EX PADS
6.0000 | MEDICATED_PAD | Freq: Every day | CUTANEOUS | Status: DC
  Administered 2020-02-04 – 2020-02-09 (×5): 6 via TOPICAL

## 2020-02-04 MED ORDER — SODIUM CHLORIDE 0.9 % IV SOLN
2.0000 g | INTRAVENOUS | Status: DC
  Administered 2020-02-04 – 2020-02-06 (×3): 2 g via INTRAVENOUS
  Filled 2020-02-04 (×2): qty 20
  Filled 2020-02-04: qty 2
  Filled 2020-02-04: qty 20

## 2020-02-04 MED ORDER — LIDOCAINE HCL 1 % IJ SOLN
INTRAMUSCULAR | Status: AC
  Filled 2020-02-04: qty 20

## 2020-02-04 MED ORDER — POLYVINYL ALCOHOL 1.4 % OP SOLN
1.0000 [drp] | Freq: Every day | OPHTHALMIC | Status: DC
  Administered 2020-02-05 – 2020-02-09 (×5): 1 [drp] via OPHTHALMIC
  Filled 2020-02-04: qty 15

## 2020-02-04 MED ORDER — PRAMIPEXOLE DIHYDROCHLORIDE 0.25 MG PO TABS
0.1250 mg | ORAL_TABLET | Freq: Every day | ORAL | Status: DC
  Administered 2020-02-04 – 2020-02-08 (×5): 0.125 mg via ORAL
  Filled 2020-02-04 (×6): qty 1

## 2020-02-04 MED ORDER — TRAMADOL HCL 50 MG PO TABS
50.0000 mg | ORAL_TABLET | Freq: Two times a day (BID) | ORAL | Status: DC | PRN
  Administered 2020-02-07: 50 mg via ORAL
  Filled 2020-02-04 (×2): qty 1

## 2020-02-04 MED ORDER — ATENOLOL 25 MG PO TABS
25.0000 mg | ORAL_TABLET | Freq: Every day | ORAL | Status: DC
  Administered 2020-02-05 – 2020-02-09 (×5): 25 mg via ORAL
  Filled 2020-02-04 (×5): qty 1

## 2020-02-04 MED ORDER — ORAL CARE MOUTH RINSE
15.0000 mL | Freq: Two times a day (BID) | OROMUCOSAL | Status: DC
  Administered 2020-02-05 – 2020-02-09 (×8): 15 mL via OROMUCOSAL

## 2020-02-04 MED ORDER — POLYETHYL GLYCOL-PROPYL GLYCOL 0.4-0.3 % OP GEL: 1.0000 "application " | Freq: Every day | OPHTHALMIC | Status: DC

## 2020-02-04 MED ORDER — ATORVASTATIN CALCIUM 10 MG PO TABS
20.0000 mg | ORAL_TABLET | Freq: Every day | ORAL | Status: DC
  Administered 2020-02-05 – 2020-02-09 (×5): 20 mg via ORAL
  Filled 2020-02-04 (×5): qty 2

## 2020-02-04 MED ORDER — ASPIRIN EC 81 MG PO TBEC
81.0000 mg | DELAYED_RELEASE_TABLET | Freq: Every day | ORAL | Status: DC
  Administered 2020-02-05 – 2020-02-09 (×5): 81 mg via ORAL
  Filled 2020-02-04 (×5): qty 1

## 2020-02-04 MED ORDER — FENTANYL CITRATE (PF) 100 MCG/2ML IJ SOLN
INTRAMUSCULAR | Status: AC | PRN
  Administered 2020-02-04: 25 ug via INTRAVENOUS

## 2020-02-04 MED ORDER — LIDOCAINE HCL 1 % IJ SOLN
INTRAMUSCULAR | Status: AC | PRN
  Administered 2020-02-04: 10 mL

## 2020-02-04 MED ORDER — MELATONIN 3 MG PO TABS
6.0000 mg | ORAL_TABLET | Freq: Every day | ORAL | Status: DC
  Administered 2020-02-04 – 2020-02-08 (×5): 6 mg via ORAL
  Filled 2020-02-04 (×6): qty 2

## 2020-02-04 MED ORDER — PERFLUTREN LIPID MICROSPHERE
1.0000 mL | INTRAVENOUS | Status: DC | PRN
  Administered 2020-02-04: 3 mL via INTRAVENOUS
  Filled 2020-02-04: qty 10

## 2020-02-04 NOTE — Progress Notes (Signed)
Patient stating many times to multiple staff members that she is ready to go to heaven and asking staff to "Please take me to [facility]." At this time patient remains a full code. Secure Chat message sent to Glyn Ade MD asking about goals of care meeting with patient.

## 2020-02-04 NOTE — Progress Notes (Signed)
Referring Physician(s):   Supervising Physician: Ruel Favors  Patient Status:  [facility] - In-pt  Chief Complaint: Left renal calculi with hydronephrosis and septic shock; Left PCN placed 02/04/20 by Dr. Miles Costain  Subjective: Patient in bed in the ICU, female visitor at the bedside. The patient denies pain/discomfort related to left PCN.  Allergies: Patient has no known allergies.  Medications: Prior to Admission medications   Medication Sig Start Date End Date Taking? Authorizing Provider  aspirin 81 MG tablet Take 81 mg by mouth daily.   Yes [provider]  atenolol (TENORMIN) 25 MG tablet Take 25 mg by mouth daily.   Yes [provider]  atorvastatin (LIPITOR) 20 MG tablet Take 20 mg by mouth daily.   Yes [provider]  Calcium-Vitamin D (CALTRATE 600 PLUS-VIT D PO) Take 2 tablets by mouth daily.    Yes [provider]  celecoxib (CELEBREX) 200 MG capsule Take 200 mg by mouth 2 (two) times daily.   Yes [provider]  cetirizine (ZYRTEC) 10 MG tablet Take 10 mg by mouth daily.   Yes [provider]  docusate sodium (COLACE) 100 MG capsule Take 100 mg by mouth 2 (two) times daily.   Yes [provider]  levothyroxine (SYNTHROID, LEVOTHROID) 75 MCG tablet Take 75 mcg by mouth daily before breakfast.    Yes [provider]  melatonin 3 MG TABS tablet Take 6 mg by mouth at bedtime.   Yes [provider]  Menthol, Topical Analgesic, (BIOFREEZE) 4 % GEL Apply 1 application topically in the morning and at bedtime.   Yes [provider]  Polyethyl Glycol-Propyl Glycol (SYSTANE) 0.4-0.3 % GEL ophthalmic gel Place 1 application into both eyes in the morning, at noon, in the evening, and at bedtime.   Yes [provider]  pramipexole (MIRAPEX) 0.125 MG tablet Take 0.125 mg by mouth at bedtime.   Yes [provider]  traMADol (ULTRAM) 50 MG tablet Take 50 mg by mouth every 4 (four) hours  as needed for moderate pain.   Yes [provider]  acetaminophen (TYLENOL) 325 MG tablet Take 2 tablets (650 mg total) by mouth every 6 (six) hours as needed for mild pain or moderate pain (or Fever >/= 101). 01/20/18   [name], MD     Vital Signs: BP (!) 109/44   Pulse 62   Temp (!) 97.5 F (36.4 C)   Resp (!) 25   Ht  (1.626 m)   Wt 126 lb 15.8 oz (57.6 kg)   SpO2 99%   BMI 21.80 kg/m   Physical Exam Constitutional:      General: She is not in acute distress. Pulmonary:     Effort: Pulmonary effort is normal.  Abdominal:     Comments: Left PCN to gravity bag. Site is unremarkable. Approximately 100 cc of dark bloody urine in bag. The patient denies pain or tenderness.   Genitourinary:    Comments: Purewick in place; dark brown urine in suction canister.  Skin:    General: Skin is warm and dry.  Neurological:     Mental Status: She is alert.     Comments: Mild confusion but able to answer most questions.      Imaging: CT ABDOMEN PELVIS W CONTRAST  Result Date: 02/03/2020 CLINICAL DATA:  Abdominal pain, fever EXAM: CT ABDOMEN AND PELVIS WITH CONTRAST TECHNIQUE: Multidetector CT imaging of the abdomen and pelvis was performed using the standard protocol following bolus  administration of intravenous contrast. CONTRAST:  75mL OMNIPAQUE IOHEXOL 300 MG/ML  SOLN COMPARISON:  None. FINDINGS: Lower chest: Small left pleural effusion and trace right pleural effusion. Dependent bibasilar atelectasis. Cardiomegaly. Densely calcified mitral valve, aortic valve, visualized coronary arteries and aorta. Hepatobiliary: No focal hepatic abnormality. Gallbladder unremarkable. Pancreas: No focal abnormality or ductal dilatation. Spleen: Calcifications in the spleen likely due to old insult. Normal size. Adrenals/Urinary Tract: Large left renal pelvic stone measures up to 1.6 cm. Mild hydronephrosis associated with this stone. Nonobstructing midpole right renal stone. Other  nonobstructing stones on the left. No ureteral stones. Adrenal glands and urinary bladder unremarkable. Upper pole cyst on the right measures 4.7 cm. Stomach/Bowel: Sigmoid diverticulosis. No active diverticulitis. Scattered diverticula also noted in the transverse colon and right colon. Moderate stool burden in the rectosigmoid colon. Stomach and small bowel decompressed, unremarkable. Vascular/Lymphatic: Aortoiliac atherosclerosis. No evidence of aneurysm or adenopathy. Reproductive: Calcified fibroids within the uterus. No adnexal mass. Other: No free fluid or free air. Musculoskeletal: No acute bony abnormality. IMPRESSION: Bilateral nephrolithiasis. 16 mm left renal pelvic stone with mild left hydronephrosis. Aortoiliac atherosclerosis.  Coronary artery disease. Colonic diverticulosis.  No active diverticulitis. Moderate stool in the rectosigmoid colon. Small left pleural effusion and trace right pleural effusion. Electronically Signed   By: Charlett NoseKevin  Dover M.D.   On: 02/03/2020 20:05   IR US Guidance  Result Date: 02/04/2020 INDICATION: 16mm left UPJ stone, mild hydronephrosis, concern for urosepsis EXAM: ULTRASOUND AND FLUOROSCOPIC 10 FRENCH LEFT NEPHROSTOMY COMPARISON:  None. MEDICATIONS: 02/03/2020; The antibiotic was administered in an appropriate time frame prior to skin puncture. ANESTHESIA/SEDATION: Fentanyl 25 mcg IV; Versed 1.0 mg IV Moderate Sedation Time:  12 MINUTES The patient was continuously monitored during the procedure by the interventional radiology nurse under my direct supervision. CONTRAST:  10 cc-administered into the collecting system(s) FLUOROSCOPY TIME:  Fluoroscopy Time: 3 minutes 12 seconds (20 mGy). COMPLICATIONS: None immediate. PROCEDURE: 86 year old female from a nursing home. No family member available. Several unsuccessful attempts were made to contact the power of attorney prior to the procedure. Therefore, emergent informed written consent was obtained from the patient  after a discussion of the procedural risks, benefits and alternatives. All questions were addressed. Maximal Sterile Barrier Technique was utilized including caps, mask, sterile gowns, sterile gloves, sterile drape, hand hygiene and skin antiseptic. A timeout was performed prior to the initiation of the procedure. Previous imaging reviewed. Patient positioned prone. Limited ultrasound performed. The mildly hydronephrotic left kidney was localized and marked for a lower pole access. Under sterile conditions and local anesthesia, ultrasound percutaneous needle access performed of the left kidney lower pole mildly dilated calyx. Needle position confirmed with ultrasound. There was return of slight blood tinged nonpurulent urine. Contrast injection confirms mild hydronephrosis and a nonobstructing left UPJ stone. Contrast immediately drained past the UPJ stone into the proximal ureter. 018 guidewire inserted followed by the Accustick dilator set. Amplatz guidewire advanced. Tract dilatation performed to insert a 10 French nephrostomy. Retention LOOP was difficult to form within the mildly dilated collecting system. Contrast injection confirms position within the collecting system. Diffuse filling defect throughout the collecting system AND proximal ureter related to hemorrhage from the somewhat difficult tube placement. Catheter secured at the skin site with a Prolene suture and connected to external gravity drainage bag. Sterile dressing applied. No immediate complication. Patient tolerated the procedure well. IMPRESSION: Successful ultrasound and fluoroscopic 10 French left nephrostomy insertion. 16 mm nonobstructing left UPJ calculus. Electronically Signed   By:  M.  Shick M.D.   On: 02/04/2020 08:35   DG Chest Port 1 View  Result Date: 02/03/2020 CLINICAL DATA:  Questionable sepsis. EXAM: PORTABLE CHEST 1 VIEW COMPARISON:  January 14, 2018 FINDINGS: The lungs are hyperinflated. There is no evidence of acute  infiltrate, pleural effusion or pneumothorax. The cardiac silhouette is mildly enlarged and unchanged in size. There is marked severity calcification of the aortic arch. Degenerative changes seen throughout the thoracic spine. IMPRESSION: Stable exam without acute or active cardiopulmonary disease. Electronically Signed   By: Aram Candela M.D.   On: 02/03/2020 18:21   ECHOCARDIOGRAM COMPLETE  Result Date: 02/04/2020    ECHOCARDIOGRAM REPORT   Patient Name:   [name] Date of Exam: 02/04/2020 Medical Rec #:  [id number]  Height:       64.0 in Accession #:    [id number] Weight:       127.0 lb Date of Birth:  [birth year]  BSA:          1.613 m Patient Age:    86 years   BP:           110/49 mmHg Patient Gender: F          HR:           64 bpm. Exam Location:  Inpatient Procedure: 2D Echo, Cardiac Doppler, Color Doppler and Intracardiac            Opacification Agent Indications:    Elevated troponin  History:        Patient has no prior history of Echocardiogram examinations.                 Risk Factors:Hypertension. E. coli UTI.  Sonographer:    Ross Ludwig RDCS (AE) Referring Phys: [id number] CHRISTOPHER R DOROTHY IMPRESSIONS  1. Left ventricular ejection fraction, by estimation, is 55 to 60%. The left ventricle has normal function. The left ventricle has no regional wall motion abnormalities. Left ventricular diastolic parameters are indeterminate.  2. Right ventricular systolic function is normal. The right ventricular size is normal.  3. Left atrial size was mildly dilated.  4. The mitral valve is degenerative. Moderate mitral valve regurgitation. No evidence of mitral stenosis. Severe mitral annular calcification.  5. Tricuspid valve regurgitation is moderate.  6. The aortic valve is tricuspid. There is moderate calcification of the aortic valve. There is moderate thickening of the aortic valve. Aortic valve regurgitation is mild. Moderate aortic valve stenosis.  7. The inferior vena cava is normal in size  with greater than 50% respiratory variability, suggesting right atrial pressure of 3 mmHg. FINDINGS  Left Ventricle: Left ventricular ejection fraction, by estimation, is 55 to 60%. The left ventricle has normal function. The left ventricle has no regional wall motion abnormalities. Definity contrast agent was given IV to delineate the left ventricular  endocardial borders. The left ventricular internal cavity size was normal in size. There is no left ventricular hypertrophy. Left ventricular diastolic parameters are indeterminate. Right Ventricle: The right ventricular size is normal. No increase in right ventricular wall thickness. Right ventricular systolic function is normal. Left Atrium: Left atrial size was mildly dilated. Right Atrium: Right atrial size was normal in size. Pericardium: There is no evidence of pericardial effusion. Mitral Valve: The mitral valve is degenerative in appearance. There is severe thickening of the mitral valve leaflet(s). There is severe calcification of the mitral valve leaflet(s). Severe mitral annular calcification. Moderate mitral valve regurgitation. No evidence of mitral valve stenosis. MV peak gradient,  12.2 mmHg. The mean mitral valve gradient is 4.0 mmHg. Tricuspid Valve: The tricuspid valve is normal in structure. Tricuspid valve regurgitation is moderate . No evidence of tricuspid stenosis. Aortic Valve: The aortic valve is tricuspid. There is moderate calcification of the aortic valve. There is moderate thickening of the aortic valve. There is moderate aortic valve annular calcification. Aortic valve regurgitation is mild. Aortic regurgitation PHT measures 406 msec. Moderate aortic stenosis is present. Aortic valve mean gradient measures 29.0 mmHg. Aortic valve peak gradient measures 49.3 mmHg. Aortic valve area, by VTI measures 0.45 cm. Pulmonic Valve: The pulmonic valve was normal in structure. Pulmonic valve regurgitation is not visualized. No evidence of pulmonic  stenosis. Aorta: The aortic root is normal in size and structure. Venous: The inferior vena cava is normal in size with greater than 50% respiratory variability, suggesting right atrial pressure of 3 mmHg. IAS/Shunts: No atrial level shunt detected by color flow Doppler.  LEFT VENTRICLE PLAX 2D LVIDd:         4.50 cm  Diastology LVIDs:         3.10 cm  LV e' medial:    4.12 cm/s LV PW:         1.70 cm  LV E/e' medial:  39.3 LV IVS:        1.10 cm  LV e' lateral:   6.25 cm/s LVOT diam:     1.70 cm  LV E/e' lateral: 25.9 LV SV:         46 LV SV Index:   28 LVOT Area:     2.27 cm  RIGHT VENTRICLE            IVC RV Basal diam:  3.20 cm    IVC diam: 1.90 cm RV S prime:     8.86 cm/s TAPSE (M-mode): 1.9 cm LEFT ATRIUM            Index       RIGHT ATRIUM           Index LA diam:      4.20 cm  2.60 cm/m  RA Area:     15.30 cm LA Vol (A4C): 104.0 ml 64.48 ml/m RA Volume:   38.20 ml  23.68 ml/m  AORTIC VALVE AV Area (Vmax):    0.61 cm AV Area (Vmean):   0.52 cm AV Area (VTI):     0.45 cm AV Vmax:           351.00 cm/s AV Vmean:          247.000 cm/s AV VTI:            1.030 m AV Peak Grad:      49.3 mmHg AV Mean Grad:      29.0 mmHg LVOT Vmax:         94.00 cm/s LVOT Vmean:        57.100 cm/s LVOT VTI:          0.202 m LVOT/AV VTI ratio: 0.20 AI PHT:            406 msec  AORTA Ao Root diam: 3.20 cm Ao Asc diam:  2.80 cm MITRAL VALVE                TRICUSPID VALVE MV Area (PHT): 3.42 cm     TR Peak grad:   41.5 mmHg MV Peak grad:  12.2 mmHg    TR Vmax:        322.00 cm/s MV Mean grad:  4.0 mmHg MV Vmax:       1.75 m/s     SHUNTS MV Vmean:      91.8 cm/s    Systemic VTI:  0.20 m MV Decel Time: 222 msec     Systemic Diam: 1.70 cm MV E velocity: 162.00 cm/s MV A velocity: 131.00 cm/s MV E/A ratio:  1.24 Charlton Haws MD Electronically signed by Charlton Haws MD Signature Date/Time: 02/04/2020/11:44:46 AM    Final    IR NEPHROSTOMY PLACEMENT LEFT  Result Date: 02/04/2020 INDICATION: 69mm left UPJ stone, mild  hydronephrosis, concern for urosepsis EXAM: ULTRASOUND AND FLUOROSCOPIC 10 FRENCH LEFT NEPHROSTOMY COMPARISON:  None. MEDICATIONS: 02/03/2020; The antibiotic was administered in an appropriate time frame prior to skin puncture. ANESTHESIA/SEDATION: Fentanyl 25 mcg IV; Versed 1.0 mg IV Moderate Sedation Time:  12 MINUTES The patient was continuously monitored during the procedure by the interventional radiology nurse under my direct supervision. CONTRAST:  10 cc-administered into the collecting system(s) FLUOROSCOPY TIME:  Fluoroscopy Time: 3 minutes 12 seconds (20 mGy). COMPLICATIONS: None immediate. PROCEDURE: 86 year old female from a nursing home. No family member available. Several unsuccessful attempts were made to contact the power of attorney prior to the procedure. Therefore, emergent informed written consent was obtained from the patient after a discussion of the procedural risks, benefits and alternatives. All questions were addressed. Maximal Sterile Barrier Technique was utilized including caps, mask, sterile gowns, sterile gloves, sterile drape, hand hygiene and skin antiseptic. A timeout was performed prior to the initiation of the procedure. Previous imaging reviewed. Patient positioned prone. Limited ultrasound performed. The mildly hydronephrotic left kidney was localized and marked for a lower pole access. Under sterile conditions and local anesthesia, ultrasound percutaneous needle access performed of the left kidney lower pole mildly dilated calyx. Needle position confirmed with ultrasound. There was return of slight blood tinged nonpurulent urine. Contrast injection confirms mild hydronephrosis and a nonobstructing left UPJ stone. Contrast immediately drained past the UPJ stone into the proximal ureter. 018 guidewire inserted followed by the Accustick dilator set. Amplatz guidewire advanced. Tract dilatation performed to insert a 10 French nephrostomy. Retention LOOP was difficult to form within  the mildly dilated collecting system. Contrast injection confirms position within the collecting system. Diffuse filling defect throughout the collecting system AND proximal ureter related to hemorrhage from the somewhat difficult tube placement. Catheter secured at the skin site with a Prolene suture and connected to external gravity drainage bag. Sterile dressing applied. No immediate complication. Patient tolerated the procedure well. IMPRESSION: Successful ultrasound and fluoroscopic 10 French left nephrostomy insertion. 16 mm nonobstructing left UPJ calculus. Electronically Signed   By: Judie Petit.  Shick M.D.   On: 02/04/2020 08:35    Labs:  CBC: Recent Labs    02/03/20 1720 02/04/20 0316  WBC 7.3 32.2*  HGB 11.8* 10.5*  HCT 36.8 34.6*  PLT 214 203    COAGS: Recent Labs    02/03/20 1720  INR 1.2  APTT 26    BMP: Recent Labs    02/03/20 1720 02/04/20 0316  NA 141 138  K 4.5 4.6  CL 106 105  CO2 20* 21*  GLUCOSE 110* 131*  BUN 37* 38*  CALCIUM 8.8* 8.5*  CREATININE 1.59* 1.64*  GFRNONAA 30* 29*    LIVER FUNCTION TESTS: Recent Labs    02/03/20 1720  BILITOT 0.9  AST 24  ALT 17  ALKPHOS 104  PROT 7.4  ALBUMIN 2.6*    Assessment and Plan:  Left renal calculi with hydronephrosis  and septic shock; Left PCN placed 02/04/20 by Dr. Miles Costain  Patient is stable and has a pending transfer out of the ICU to a Med-Surg floor. WBC 32.2, BUN/Cr. 38/1.64. Lactic acid is 2.3. Output documented in  Epic is 175 ml with another 100 ml in the gravity bag.    IR recommends to continue monitoring output, change the dressing daily or as needed.   Other plans per primary teams including Critical Care, Cardiology and Urology. IR will continue to follow.   Electronically Signed: Alwyn Ren, AGACNP-BC [phone] 02/04/2020, 11:49 AM   I spent a total of 15 Minutes at the the patient's bedside AND on the patient's hospital floor or unit, greater than 50% of which was  counseling/coordinating care for left PCN.

## 2020-02-04 NOTE — Plan of Care (Signed)
Pt admitted from [place] with a complaint of chest pain on Weds 02/01/2020 and elevated troponin and D-dimer results on Friday 02/03/2020. Pt found to have a 88mm renal stone. Left nephrostomy tube was placed. Pt now in 2H. Levophed and LR infusing per MD orders. Patient has purwick for urine collection. Levo is infusing via PIV, will titrate and monitor.

## 2020-02-04 NOTE — Progress Notes (Signed)
Urology Inpatient Progress Report        Intv/Subj: Patient had urgent left PCN tube placed by interventional radiology last night.  Subsequent WBC at 32.  Patient is asleep but when woken complains of pain all over.Active Problems:   Severe sepsis (HCC)  Current Facility-Administered Medications  Medication Dose Route Frequency Provider Last Rate Last Admin  . ceFEPIme (MAXIPIME) 2 g in sodium chloride 0.9 % 100 mL IVPB  2 g Intravenous Q24H Titus Mould, RPH      . Chlorhexidine Gluconate Cloth 2 % PADS 6 each  6 each Topical Q0600 Kirtland Bouchard, MD      . docusate sodium (COLACE) capsule 100 mg  100 mg Oral BID PRN Kirtland Bouchard, MD      . heparin injection 5,000 Units  5,000 Units Subcutaneous Q8H Kirtland Bouchard, MD   5,000 Units at 02/04/20 0219  . lactated ringers infusion   Intravenous Continuous Kirtland Bouchard, MD 50 mL/hr at 02/04/20 0600 Infusion Verify at 02/04/20 0600  . MEDLINE mouth rinse  15 mL Mouth Rinse BID Kirtland Bouchard, MD      . norepinephrine (LEVOPHED) 4mg  in premix infusion  0-40 mcg/min Intravenous Continuous Hendley, Cornelia, MD 1.88 mL/hr at 02/04/20 0600 0.5 mcg/min at 02/04/20 0600  . pantoprazole (PROTONIX) injection 40 mg  40 mg Intravenous QHS 14/11/21, MD      . polyethylene glycol [facility] / GLYCOLAX) packet 17 g  17 g Oral Daily PRN [facility], MD      . Kirtland Bouchard ON 02/05/2020] vancomycin (VANCOREADY) IVPB 750 mg/150 mL  750 mg Intravenous Q48H 14/01/2020, Titus Mould         Objective: Vital: Vitals:   02/04/20 0600 02/04/20 0615 02/04/20 0630 02/04/20 0645  BP: (!) 104/46 (!) 95/59 (!) 113/47 (!) 110/49  Pulse: 63 62 63 66  Resp: (!) 26 17 (!) 26 17  Temp:      TempSrc:      SpO2: 97% 100% 98% 96%  Weight:      Height:       I/Os: I/O last 3 completed shifts: In: 1032.2 [I.V.:249.6; IV Piggyback:782.6] Out: 175 [Urine:175]  Physical Exam:  General: Patient is in no  apparent distress Lungs: Normal respiratory effort, chest expands symmetrically. GI: The abdomen is soft and nontender  LEFT PCN: draining blood tinged urine Ext: lower extremities symmetric  Lab Results: Recent Labs    02/03/20 1720 02/04/20 0316  WBC 7.3 32.2*  HGB 11.8* 10.5*  HCT 36.8 34.6*   Recent Labs    02/03/20 1720 02/04/20 0316  NA 141 138  K 4.5 4.6  CL 106 105  CO2 20* 21*  GLUCOSE 110* 131*  BUN 37* 38*  CREATININE 1.59* 1.64*  CALCIUM 8.8* 8.5*   Recent Labs    02/03/20 1720  INR 1.2   No results for input(s): LABURIN in the last 72 hours. Results for orders placed or performed during the hospital encounter of 02/03/20  Resp Panel by RT-PCR (Flu A&B, Covid) Nasopharyngeal Swab     Status: None   Collection Time: 02/03/20  5:24 PM   Specimen: Nasopharyngeal Swab; Nasopharyngeal(NP) swabs in vial transport medium  Result Value Ref Range Status   SARS Coronavirus 2 by RT PCR NEGATIVE NEGATIVE Final    Comment: (NOTE) SARS-CoV-2 target nucleic acids are NOT DETECTED.  The SARS-CoV-2 RNA is generally detectable in upper respiratory specimens during the acute phase of infection.  The lowest concentration of SARS-CoV-2 viral copies this assay can detect is 138 copies/mL. A negative result does not preclude SARS-Cov-2 infection and should not be used as the sole basis for treatment or other patient management decisions. A negative result may occur with  improper specimen collection/handling, submission of specimen other than nasopharyngeal swab, presence of viral mutation(s) within the areas targeted by this assay, and inadequate number of viral copies(<138 copies/mL). A negative result must be combined with clinical observations, patient history, and epidemiological information. The expected result is Negative.  Fact Sheet for Patients:  BloggerCourse.com  Fact Sheet for Healthcare Providers:   SeriousBroker.it  This test is no t yet approved or cleared by the [place] FDA and  has been authorized for detection and/or diagnosis of SARS-CoV-2 by FDA under an Emergency Use Authorization (EUA). This EUA will remain  in effect (meaning this test can be used) for the duration of the COVID-19 declaration under Section 564(b)(1) of the Act, 21 U.S.C.section 360bbb-3(b)(1), unless the authorization is terminated  or revoked sooner.       Influenza A by PCR NEGATIVE NEGATIVE Final   Influenza B by PCR NEGATIVE NEGATIVE Final    Comment: (NOTE) The Xpert Xpress SARS-CoV-2/FLU/RSV plus assay is intended as an aid in the diagnosis of influenza from Nasopharyngeal swab specimens and should not be used as a sole basis for treatment. Nasal washings and aspirates are unacceptable for Xpert Xpress SARS-CoV-2/FLU/RSV testing.  Fact Sheet for Patients: BloggerCourse.com  Fact Sheet for Healthcare Providers: SeriousBroker.it  This test is not yet approved or cleared by the [place] FDA and has been authorized for detection and/or diagnosis of SARS-CoV-2 by FDA under an Emergency Use Authorization (EUA). This EUA will remain in effect (meaning this test can be used) for the duration of the COVID-19 declaration under Section 564(b)(1) of the Act, 21 U.S.C. section 360bbb-3(b)(1), unless the authorization is terminated or revoked.  Performed at [facility], [address], [place], [place] [address]   Blood Culture (routine x 2)     Status: None (Preliminary result)   Collection Time: 02/03/20  5:36 PM   Specimen: BLOOD RIGHT WRIST  Result Value Ref Range Status   Specimen Description BLOOD RIGHT WRIST  Final   Special Requests   Final    BOTTLES DRAWN AEROBIC AND ANAEROBIC Blood Culture adequate volume   Culture  Setup Time   Final    IN BOTH AEROBIC AND ANAEROBIC BOTTLES GRAM NEGATIVE  RODS Organism ID to follow Performed at [facility], [address], [place], [place] [address]    Culture PENDING  Incomplete   Report Status PENDING  Incomplete  MRSA PCR Screening     Status: None   Collection Time: 02/04/20  1:45 AM   Specimen: Nasopharyngeal  Result Value Ref Range Status   MRSA by PCR NEGATIVE NEGATIVE Final    Comment:        The GeneXpert MRSA Assay (FDA approved for NASAL specimens only), is one component of a comprehensive MRSA colonization surveillance program. It is not intended to diagnose MRSA infection nor to guide or monitor treatment for MRSA infections. Performed at [facility], [address], [place], [place] [address]     Studies/Results: CT ABDOMEN PELVIS W CONTRAST  Result Date: 02/03/2020 CLINICAL DATA:  Abdominal pain, fever EXAM: CT ABDOMEN AND PELVIS WITH CONTRAST TECHNIQUE: Multidetector CT imaging of the abdomen and pelvis was performed using the standard protocol following  bolus administration of intravenous contrast. CONTRAST:  92mL OMNIPAQUE IOHEXOL 300 MG/ML  SOLN COMPARISON:  None. FINDINGS: Lower chest: Small left pleural effusion and trace right pleural effusion. Dependent bibasilar atelectasis. Cardiomegaly. Densely calcified mitral valve, aortic valve, visualized coronary arteries and aorta. Hepatobiliary: No focal hepatic abnormality. Gallbladder unremarkable. Pancreas: No focal abnormality or ductal dilatation. Spleen: Calcifications in the spleen likely due to old insult. Normal size. Adrenals/Urinary Tract: Large left renal pelvic stone measures up to 1.6 cm. Mild hydronephrosis associated with this stone. Nonobstructing midpole right renal stone. Other nonobstructing stones on the left. No ureteral stones. Adrenal glands and urinary bladder unremarkable. Upper pole cyst on the right measures 4.7 cm. Stomach/Bowel: Sigmoid diverticulosis. No active diverticulitis. Scattered diverticula also noted in the transverse  colon and right colon. Moderate stool burden in the rectosigmoid colon. Stomach and small bowel decompressed, unremarkable. Vascular/Lymphatic: Aortoiliac atherosclerosis. No evidence of aneurysm or adenopathy. Reproductive: Calcified fibroids within the uterus. No adnexal mass. Other: No free fluid or free air. Musculoskeletal: No acute bony abnormality. IMPRESSION: Bilateral nephrolithiasis. 16 mm left renal pelvic stone with mild left hydronephrosis. Aortoiliac atherosclerosis.  Coronary artery disease. Colonic diverticulosis.  No active diverticulitis. Moderate stool in the rectosigmoid colon. Small left pleural effusion and trace right pleural effusion. Electronically Signed   By: Charlett Nose M.D.   On: 02/03/2020 20:05   DG Chest Port 1 View  Result Date: 02/03/2020 CLINICAL DATA:  Questionable sepsis. EXAM: PORTABLE CHEST 1 VIEW COMPARISON:  January 14, 2018 FINDINGS: The lungs are hyperinflated. There is no evidence of acute infiltrate, pleural effusion or pneumothorax. The cardiac silhouette is mildly enlarged and unchanged in size. There is marked severity calcification of the aortic arch. Degenerative changes seen throughout the thoracic spine. IMPRESSION: Stable exam without acute or active cardiopulmonary disease. Electronically Signed   By: Aram Candela M.D.   On: 02/03/2020 18:21    Assessment: 86 year old woman who presented to the ED with chest pain and abdominal pain found to have NSTEMI as well as 12 mm left renal pelvis stone and clinical signs of sepsis now s/p LEFT PCN tube.    Plan: -continue left PCN tube -continue abx and follow urine culture senstivities -will need outpatient urology follow up with discuss stone management  Kasandra Knudsen, MD Urology 02/04/2020, 7:08 AM

## 2020-02-04 NOTE — ED Notes (Signed)
Patient to IR at this time.   

## 2020-02-04 NOTE — Progress Notes (Signed)
Notified Dr Denese Killings regarding patient refusing medication PO and also patient c/o severe pain. Patient also stating that she is "ready to go to [facility]." Notified of this as well. Will continue to monitor.

## 2020-02-04 NOTE — Progress Notes (Signed)
eLink Physician-Brief Progress Note Patient Name: [name] DOB: [birth year] MRN: [id number]   Date of Service  02/04/2020  HPI/Events of Note  62F with septic shock secondary to 99mm left renal pelvic stone with mild associated hydronephrosis. She is now s/p L percutaneous nephrostomy tube with IR. Also has an NSTEMI secondary to demand ischemia.  She is now admitted to the ICU and has HR 67, BP 100/47 (64), SpO2 98% on 2L [place]. She is on levophed at 2.5 mcg/min. In no apparent distress.   eICU Interventions  # Cardiac: - S/p fluid resuscitation in ED. - Continues on isotonic fluids @ 50cc/hr. - Levophed for goal MAP >\= 65 mmHg. - NSTEMI: Treat underlying disease process / septic shock. Consult cardiology. Not an appropriate candidate for heparinization in interim given nephrostomy tube in past few hours, and unlikely to be of benefit either since this is almost certainly a demand-related process.  # Respiratory: - NAI.  # GU/ID: - Septic shock: vanc/cefepime empirically. S/p L perc nephrostomy tube for source control. - F/u UCx/BCx.  # GI: - NPO for now.      Intervention Category Evaluation Type: New Patient Evaluation  Janae Bridgeman 02/04/2020, 1:31 AM

## 2020-02-04 NOTE — Progress Notes (Signed)
  Echocardiogram 2D Echocardiogram has been performed with Definity.  Gerda Diss 02/04/2020, 10:10 AM

## 2020-02-04 NOTE — Progress Notes (Signed)
PHARMACY - PHYSICIAN COMMUNICATION CRITICAL VALUE ALERT - BLOOD CULTURE IDENTIFICATION (BCID)  [name] is an 86 y.o. female who presented to [facility] on 02/03/2020 with a chief complaint of sepsis from renal calculi.   Assessment:  89yof from nursing home, found to have fever of 104, found to have renal calculi on CT of the abd. Perc drain placed overnight. Kebl pneumo noted on BCID.   Name of physician (or Provider) Contacted: Agarwala Current antibiotics: cefepime  Changes to prescribed antibiotics recommended:  Recommendations accepted by provider, will de-escalate to ceftriaxone 2 g IV q24 hours.   Results for orders placed or performed during the hospital encounter of 02/03/20  Blood Culture ID Panel (Reflexed) (Collected: 02/03/2020  5:36 PM)  Result Value Ref Range   Enterococcus faecalis NOT DETECTED NOT DETECTED   Enterococcus Faecium NOT DETECTED NOT DETECTED   Listeria monocytogenes NOT DETECTED NOT DETECTED   Staphylococcus species NOT DETECTED NOT DETECTED   Staphylococcus aureus (BCID) NOT DETECTED NOT DETECTED   Staphylococcus epidermidis NOT DETECTED NOT DETECTED   Staphylococcus lugdunensis NOT DETECTED NOT DETECTED   Streptococcus species NOT DETECTED NOT DETECTED   Streptococcus agalactiae NOT DETECTED NOT DETECTED   Streptococcus pneumoniae NOT DETECTED NOT DETECTED   Streptococcus pyogenes NOT DETECTED NOT DETECTED   A.calcoaceticus-baumannii NOT DETECTED NOT DETECTED   Bacteroides fragilis NOT DETECTED NOT DETECTED   Enterobacterales DETECTED (A) NOT DETECTED   Enterobacter cloacae complex NOT DETECTED NOT DETECTED   Escherichia coli NOT DETECTED NOT DETECTED   Klebsiella aerogenes NOT DETECTED NOT DETECTED   Klebsiella oxytoca NOT DETECTED NOT DETECTED   Klebsiella pneumoniae DETECTED (A) NOT DETECTED   Proteus species NOT DETECTED NOT DETECTED   Salmonella species NOT DETECTED NOT DETECTED   Serratia marcescens NOT DETECTED NOT DETECTED    Haemophilus influenzae NOT DETECTED NOT DETECTED   Neisseria meningitidis NOT DETECTED NOT DETECTED   Pseudomonas aeruginosa NOT DETECTED NOT DETECTED   Stenotrophomonas maltophilia NOT DETECTED NOT DETECTED   Candida albicans NOT DETECTED NOT DETECTED   Candida auris NOT DETECTED NOT DETECTED   Candida glabrata NOT DETECTED NOT DETECTED   Candida krusei NOT DETECTED NOT DETECTED   Candida parapsilosis NOT DETECTED NOT DETECTED   Candida tropicalis NOT DETECTED NOT DETECTED   Cryptococcus neoformans/gattii NOT DETECTED NOT DETECTED   CTX-M ESBL NOT DETECTED NOT DETECTED   Carbapenem resistance IMP NOT DETECTED NOT DETECTED   Carbapenem resistance KPC NOT DETECTED NOT DETECTED   Carbapenem resistance NDM NOT DETECTED NOT DETECTED   Carbapenem resist OXA 48 LIKE NOT DETECTED NOT DETECTED   Carbapenem resistance VIM NOT DETECTED NOT DETECTED   Sheppard Coil PharmD., BCPS Clinical Pharmacist 02/04/2020 8:52 AM

## 2020-02-04 NOTE — Consult Note (Signed)
CARDIOLOGY CONSULT NOTE       Patient ID: [name] MRN: [id number] DOB/AGE: Nov 08, 1924 86 y.o.  Admit date: 02/03/2020 Referring Physician: Alena Bills Primary Physician: Jarrett Soho, PA-C Primary Cardiologist: New Reason for Consultation: NSTEMI  Active Problems:   Severe sepsis [facility])   HPI:  86 y.o. history of HTN, PVD CEA on right with residual 40-59% left ICA stenosis . Admitted with sepsis and renal calculi. Temp 104 post nephrostomy drainage on iv ceftriaxone. No previous cardiac history. No chest pain Troponin peak 3587.  ECG with no acute changes just SR with RBBB. Reviewed her echo from today and EF 55-60% moderate AS, moderate TR, moderate MR. Baseline function at nursing home is essentially non ambulatory with some cognitive deficits. Currently hemodynamically stable with no chest pain  ROS All other systems reviewed and negative except as noted above  Past Medical History:  Diagnosis Date  . ARF (acute renal failure) (HCC) 11/2017  . Arthritis   . Carotid artery occlusion    Bruit found August 2009  . Hypertension   . Hypothyroidism    Hypothyroidism  . Macular degeneration of left eye   . Multiple falls 12/16/2017  . UTI (urinary tract infection) 12/16/2017    Family History  Problem Relation Age of Onset  . Heart disease Mother        After age 7  . Stroke Mother   . Heart attack Mother   . Hypertension Mother   . Other Mother        Amputation of Leg- Because of a fall in Nursing Home.    Social History   Socioeconomic History  . Marital status: Single    Spouse name: Not on file  . Number of children: Not on file  . Years of education: Not on file  . Highest education level: Not on file  Occupational History  . Not on file  Tobacco Use  . Smoking status: Never Smoker  . Smokeless tobacco: Never Used  Vaping Use  . Vaping Use: Never used  Substance and Sexual Activity  . Alcohol use: No  . Drug use: No  . Sexual activity: Not  Currently  Other Topics Concern  . Not on file  Social History Narrative  . Not on file   Social Determinants of Health   Financial Resource Strain: Not on file  Food Insecurity: Not on file  Transportation Needs: Not on file  Physical Activity: Not on file  Stress: Not on file  Social Connections: Not on file  Intimate Partner Violence: Not on file    Past Surgical History:  Procedure Laterality Date  . APPENDECTOMY    . CAROTID ENDARTERECTOMY  04/14/2008   Right  . CATARACT EXTRACTION    . IR NEPHROSTOMY PLACEMENT LEFT  02/04/2020  . IR US GUIDANCE  02/04/2020  . LUMBAR LAMINECTOMY        Current Facility-Administered Medications:  .  cefTRIAXone (ROCEPHIN) 2 g in sodium chloride 0.9 % 100 mL IVPB, 2 g, Intravenous, Q24H, Earnie Larsson, RPH, Last Rate: 200 mL/hr at 02/04/20 1006, 2 g at 02/04/20 1006 .  Chlorhexidine Gluconate Cloth 2 % PADS 6 each, 6 each, Topical, Q0600, Kirtland Bouchard, MD .  docusate sodium (COLACE) capsule 100 mg, 100 mg, Oral, BID PRN, Kirtland Bouchard, MD .  heparin injection 5,000 Units, 5,000 Units, Subcutaneous, Q8H, Kirtland Bouchard, MD, 5,000 Units at 02/04/20 8295 .  lactated ringers infusion, , Intravenous, Continuous, Kirtland Bouchard, MD, Last  Rate: 50 mL/hr at 02/04/20 0942, Infusion Verify at 02/04/20 0942 .  MEDLINE mouth rinse, 15 mL, Mouth Rinse, BID, Kirtland Bouchard, MD .  pantoprazole (PROTONIX) injection 40 mg, 40 mg, Intravenous, QHS, Kirtland Bouchard, MD .  polyethylene glycol (MIRALAX / GLYCOLAX) packet 17 g, 17 g, Oral, Daily PRN, Kirtland Bouchard, MD . Chlorhexidine Gluconate Cloth  6 each Topical Q0600  . heparin  5,000 Units Subcutaneous Q8H  . mouth rinse  15 mL Mouth Rinse BID  . pantoprazole (PROTONIX) IV  40 mg Intravenous QHS   . cefTRIAXone (ROCEPHIN)  IV 2 g (02/04/20 1006)  . lactated ringers 50 mL/hr at 02/04/20 1610    Physical Exam: Blood pressure (!) 109/44, pulse  62, temperature (!) 97.5 F (36.4 C), resp. rate (!) 25, height  (1.626 m), weight 57.6 kg, SpO2 99 %.   Pale Frail Elderly female Nephrostomy tube AS murmur  Post right CEA Lungs clear No edema Not volume overloaded   Labs:   Lab Results  Component Value Date   WBC 32.2 (H) 02/04/2020   HGB 10.5 (L) 02/04/2020   HCT 34.6 (L) 02/04/2020   MCV 93.8 02/04/2020   PLT 203 02/04/2020    Recent Labs  Lab 02/03/20 1720 02/04/20 0316  NA 141 138  K 4.5 4.6  CL 106 105  CO2 20* 21*  BUN 37* 38*  CREATININE 1.59* 1.64*  CALCIUM 8.8* 8.5*  PROT 7.4  --   BILITOT 0.9  --   ALKPHOS 104  --   ALT 17  --   AST 24  --   GLUCOSE 110* 131*   Lab Results  Component Value Date   CKTOTAL 2,658 (H) 12/15/2017   No results found for: CHOL No results found for: HDL No results found for: LDLCALC No results found for: TRIG No results found for: CHOLHDL No results found for: LDLDIRECT    Radiology: CT ABDOMEN PELVIS W CONTRAST  Result Date: 02/03/2020 CLINICAL DATA:  Abdominal pain, fever EXAM: CT ABDOMEN AND PELVIS WITH CONTRAST TECHNIQUE: Multidetector CT imaging of the abdomen and pelvis was performed using the standard protocol following bolus administration of intravenous contrast. CONTRAST:  75mL OMNIPAQUE IOHEXOL 300 MG/ML  SOLN COMPARISON:  None. FINDINGS: Lower chest: Small left pleural effusion and trace right pleural effusion. Dependent bibasilar atelectasis. Cardiomegaly. Densely calcified mitral valve, aortic valve, visualized coronary arteries and aorta. Hepatobiliary: No focal hepatic abnormality. Gallbladder unremarkable. Pancreas: No focal abnormality or ductal dilatation. Spleen: Calcifications in the spleen likely due to old insult. Normal size. Adrenals/Urinary Tract: Large left renal pelvic stone measures up to 1.6 cm. Mild hydronephrosis associated with this stone. Nonobstructing midpole right renal stone. Other nonobstructing stones on the left. No ureteral  stones. Adrenal glands and urinary bladder unremarkable. Upper pole cyst on the right measures 4.7 cm. Stomach/Bowel: Sigmoid diverticulosis. No active diverticulitis. Scattered diverticula also noted in the transverse colon and right colon. Moderate stool burden in the rectosigmoid colon. Stomach and small bowel decompressed, unremarkable. Vascular/Lymphatic: Aortoiliac atherosclerosis. No evidence of aneurysm or adenopathy. Reproductive: Calcified fibroids within the uterus. No adnexal mass. Other: No free fluid or free air. Musculoskeletal: No acute bony abnormality. IMPRESSION: Bilateral nephrolithiasis. 16 mm left renal pelvic stone with mild left hydronephrosis. Aortoiliac atherosclerosis.  Coronary artery disease. Colonic diverticulosis.  No active diverticulitis. Moderate stool in the rectosigmoid colon. Small left pleural effusion and trace right pleural effusion. Electronically Signed   By: Charlett Nose M.D.   On:  02/03/2020 20:05   IR US Guidance  Result Date: 02/04/2020 INDICATION: 21mm left UPJ stone, mild hydronephrosis, concern for urosepsis EXAM: ULTRASOUND AND FLUOROSCOPIC 10 FRENCH LEFT NEPHROSTOMY COMPARISON:  None. MEDICATIONS: 02/03/2020; The antibiotic was administered in an appropriate time frame prior to skin puncture. ANESTHESIA/SEDATION: Fentanyl 25 mcg IV; Versed 1.0 mg IV Moderate Sedation Time:  12 MINUTES The patient was continuously monitored during the procedure by the interventional radiology nurse under my direct supervision. CONTRAST:  10 cc-administered into the collecting system(s) FLUOROSCOPY TIME:  Fluoroscopy Time: 3 minutes 12 seconds (20 mGy). COMPLICATIONS: None immediate. PROCEDURE: 86 year old female from a nursing home. No family member available. Several unsuccessful attempts were made to contact the power of attorney prior to the procedure. Therefore, emergent informed written consent was obtained from the patient after a discussion of the procedural risks,  benefits and alternatives. All questions were addressed. Maximal Sterile Barrier Technique was utilized including caps, mask, sterile gowns, sterile gloves, sterile drape, hand hygiene and skin antiseptic. A timeout was performed prior to the initiation of the procedure. Previous imaging reviewed. Patient positioned prone. Limited ultrasound performed. The mildly hydronephrotic left kidney was localized and marked for a lower pole access. Under sterile conditions and local anesthesia, ultrasound percutaneous needle access performed of the left kidney lower pole mildly dilated calyx. Needle position confirmed with ultrasound. There was return of slight blood tinged nonpurulent urine. Contrast injection confirms mild hydronephrosis and a nonobstructing left UPJ stone. Contrast immediately drained past the UPJ stone into the proximal ureter. 018 guidewire inserted followed by the Accustick dilator set. Amplatz guidewire advanced. Tract dilatation performed to insert a 10 French nephrostomy. Retention LOOP was difficult to form within the mildly dilated collecting system. Contrast injection confirms position within the collecting system. Diffuse filling defect throughout the collecting system AND proximal ureter related to hemorrhage from the somewhat difficult tube placement. Catheter secured at the skin site with a Prolene suture and connected to external gravity drainage bag. Sterile dressing applied. No immediate complication. Patient tolerated the procedure well. IMPRESSION: Successful ultrasound and fluoroscopic 10 French left nephrostomy insertion. 16 mm nonobstructing left UPJ calculus. Electronically Signed   By: Judie Petit.  Shick M.D.   On: 02/04/2020 08:35   DG Chest Port 1 View  Result Date: 02/03/2020 CLINICAL DATA:  Questionable sepsis. EXAM: PORTABLE CHEST 1 VIEW COMPARISON:  January 14, 2018 FINDINGS: The lungs are hyperinflated. There is no evidence of acute infiltrate, pleural effusion or pneumothorax.  The cardiac silhouette is mildly enlarged and unchanged in size. There is marked severity calcification of the aortic arch. Degenerative changes seen throughout the thoracic spine. IMPRESSION: Stable exam without acute or active cardiopulmonary disease. Electronically Signed   By: Aram Candela M.D.   On: 02/03/2020 18:21   ECHOCARDIOGRAM COMPLETE  Result Date: 02/04/2020    ECHOCARDIOGRAM REPORT   Patient Name:   [name] Date of Exam: 02/04/2020 Medical Rec #:  [id number]  Height:       64.0 in Accession #:    [id number] Weight:       127.0 lb Date of Birth:  [birth year]  BSA:          1.613 m Patient Age:    89 years   BP:           110/49 mmHg Patient Gender: F          HR:           64 bpm. Exam Location:  Inpatient Procedure: 2D Echo, Cardiac  Doppler, Color Doppler and Intracardiac            Opacification Agent Indications:    Elevated troponin  History:        Patient has no prior history of Echocardiogram examinations.                 Risk Factors:Hypertension. E. coli UTI.  Sonographer:    Ross Ludwig RDCS (AE) Referring Phys: [id number] CHRISTOPHER R DOROTHY IMPRESSIONS  1. Left ventricular ejection fraction, by estimation, is 55 to 60%. The left ventricle has normal function. The left ventricle has no regional wall motion abnormalities. Left ventricular diastolic parameters are indeterminate.  2. Right ventricular systolic function is normal. The right ventricular size is normal.  3. Left atrial size was mildly dilated.  4. The mitral valve is degenerative. Moderate mitral valve regurgitation. No evidence of mitral stenosis. Severe mitral annular calcification.  5. Tricuspid valve regurgitation is moderate.  6. The aortic valve is tricuspid. There is moderate calcification of the aortic valve. There is moderate thickening of the aortic valve. Aortic valve regurgitation is mild. Moderate aortic valve stenosis.  7. The inferior vena cava is normal in size with greater than 50% respiratory variability,  suggesting right atrial pressure of 3 mmHg. FINDINGS  Left Ventricle: Left ventricular ejection fraction, by estimation, is 55 to 60%. The left ventricle has normal function. The left ventricle has no regional wall motion abnormalities. Definity contrast agent was given IV to delineate the left ventricular  endocardial borders. The left ventricular internal cavity size was normal in size. There is no left ventricular hypertrophy. Left ventricular diastolic parameters are indeterminate. Right Ventricle: The right ventricular size is normal. No increase in right ventricular wall thickness. Right ventricular systolic function is normal. Left Atrium: Left atrial size was mildly dilated. Right Atrium: Right atrial size was normal in size. Pericardium: There is no evidence of pericardial effusion. Mitral Valve: The mitral valve is degenerative in appearance. There is severe thickening of the mitral valve leaflet(s). There is severe calcification of the mitral valve leaflet(s). Severe mitral annular calcification. Moderate mitral valve regurgitation. No evidence of mitral valve stenosis. MV peak gradient, 12.2 mmHg. The mean mitral valve gradient is 4.0 mmHg. Tricuspid Valve: The tricuspid valve is normal in structure. Tricuspid valve regurgitation is moderate . No evidence of tricuspid stenosis. Aortic Valve: The aortic valve is tricuspid. There is moderate calcification of the aortic valve. There is moderate thickening of the aortic valve. There is moderate aortic valve annular calcification. Aortic valve regurgitation is mild. Aortic regurgitation PHT measures 406 msec. Moderate aortic stenosis is present. Aortic valve mean gradient measures 29.0 mmHg. Aortic valve peak gradient measures 49.3 mmHg. Aortic valve area, by VTI measures 0.45 cm. Pulmonic Valve: The pulmonic valve was normal in structure. Pulmonic valve regurgitation is not visualized. No evidence of pulmonic stenosis. Aorta: The aortic root is normal in  size and structure. Venous: The inferior vena cava is normal in size with greater than 50% respiratory variability, suggesting right atrial pressure of 3 mmHg. IAS/Shunts: No atrial level shunt detected by color flow Doppler.  LEFT VENTRICLE PLAX 2D LVIDd:         4.50 cm  Diastology LVIDs:         3.10 cm  LV e' medial:    4.12 cm/s LV PW:         1.70 cm  LV E/e' medial:  39.3 LV IVS:        1.10 cm  LV e' lateral:   6.25 cm/s LVOT diam:     1.70 cm  LV E/e' lateral: 25.9 LV SV:         46 LV SV Index:   28 LVOT Area:     2.27 cm  RIGHT VENTRICLE            IVC RV Basal diam:  3.20 cm    IVC diam: 1.90 cm RV S prime:     8.86 cm/s TAPSE (M-mode): 1.9 cm LEFT ATRIUM            Index       RIGHT ATRIUM           Index LA diam:      4.20 cm  2.60 cm/m  RA Area:     15.30 cm LA Vol (A4C): 104.0 ml 64.48 ml/m RA Volume:   38.20 ml  23.68 ml/m  AORTIC VALVE AV Area (Vmax):    0.61 cm AV Area (Vmean):   0.52 cm AV Area (VTI):     0.45 cm AV Vmax:           351.00 cm/s AV Vmean:          247.000 cm/s AV VTI:            1.030 m AV Peak Grad:      49.3 mmHg AV Mean Grad:      29.0 mmHg LVOT Vmax:         94.00 cm/s LVOT Vmean:        57.100 cm/s LVOT VTI:          0.202 m LVOT/AV VTI ratio: 0.20 AI PHT:            406 msec  AORTA Ao Root diam: 3.20 cm Ao Asc diam:  2.80 cm MITRAL VALVE                TRICUSPID VALVE MV Area (PHT): 3.42 cm     TR Peak grad:   41.5 mmHg MV Peak grad:  12.2 mmHg    TR Vmax:        322.00 cm/s MV Mean grad:  4.0 mmHg MV Vmax:       1.75 m/s     SHUNTS MV Vmean:      91.8 cm/s    Systemic VTI:  0.20 m MV Decel Time: 222 msec     Systemic Diam: 1.70 cm MV E velocity: 162.00 cm/s MV A velocity: 131.00 cm/s MV E/A ratio:  1.24 Charlton HawsPeter Siyana Erney MD Electronically signed by Charlton HawsPeter Gabor Lusk MD Signature Date/Time: 02/04/2020/11:44:46 AM    Final    IR NEPHROSTOMY PLACEMENT LEFT  Result Date: 02/04/2020 INDICATION: 16mm left UPJ stone, mild hydronephrosis, concern for urosepsis EXAM: ULTRASOUND  AND FLUOROSCOPIC 10 FRENCH LEFT NEPHROSTOMY COMPARISON:  None. MEDICATIONS: 02/03/2020; The antibiotic was administered in an appropriate time frame prior to skin puncture. ANESTHESIA/SEDATION: Fentanyl 25 mcg IV; Versed 1.0 mg IV Moderate Sedation Time:  12 MINUTES The patient was continuously monitored during the procedure by the interventional radiology nurse under my direct supervision. CONTRAST:  10 cc-administered into the collecting system(s) FLUOROSCOPY TIME:  Fluoroscopy Time: 3 minutes 12 seconds (20 mGy). COMPLICATIONS: None immediate. PROCEDURE: 86 year old female from a nursing home. No family member available. Several unsuccessful attempts were made to contact the power of attorney prior to the procedure. Therefore, emergent informed written consent was obtained from the patient after a discussion of the procedural risks, benefits and alternatives. All  questions were addressed. Maximal Sterile Barrier Technique was utilized including caps, mask, sterile gowns, sterile gloves, sterile drape, hand hygiene and skin antiseptic. A timeout was performed prior to the initiation of the procedure. Previous imaging reviewed. Patient positioned prone. Limited ultrasound performed. The mildly hydronephrotic left kidney was localized and marked for a lower pole access. Under sterile conditions and local anesthesia, ultrasound percutaneous needle access performed of the left kidney lower pole mildly dilated calyx. Needle position confirmed with ultrasound. There was return of slight blood tinged nonpurulent urine. Contrast injection confirms mild hydronephrosis and a nonobstructing left UPJ stone. Contrast immediately drained past the UPJ stone into the proximal ureter. 018 guidewire inserted followed by the Accustick dilator set. Amplatz guidewire advanced. Tract dilatation performed to insert a 10 French nephrostomy. Retention LOOP was difficult to form within the mildly dilated collecting system. Contrast  injection confirms position within the collecting system. Diffuse filling defect throughout the collecting system AND proximal ureter related to hemorrhage from the somewhat difficult tube placement. Catheter secured at the skin site with a Prolene suture and connected to external gravity drainage bag. Sterile dressing applied. No immediate complication. Patient tolerated the procedure well. IMPRESSION: Successful ultrasound and fluoroscopic 10 French left nephrostomy insertion. 16 mm nonobstructing left UPJ calculus. Electronically Signed   By: Judie Petit.  Shick M.D.   On: 02/04/2020 08:35    EKG: SR RBBB   ASSESSMENT AND PLAN:   1. NSTEMI:  Despite elevated troponin 3587 TTE with no RWMAls preserved EF and ECG with RBBB only. She is euvolemic being hydrated for sepsis Continue aspirin resume home atenolol and add plavix She is not a candidate for cath given age, lack of chest pain. Preserved EF and co morbidities with active sepsis , nephrostomy tube and poor functional/cognitive abilities at baseline   2. Valvular Dx:  Moderate AS, moderate MR, moderate TR. Observe No signs of CHF not a candidate for TAVR or other interventions   3. Sepsis:  Pyonephritis post nephrostomy tube on  Rocephin   4. Thyroid:  Resume home synthroid dose 75 ug/day   Signed: Charlton Haws 02/04/2020, 12:09 PM

## 2020-02-04 NOTE — Progress Notes (Signed)
NAME:  [name], MRN:  [id number], DOB:  Sep 25, 1924, LOS: 1 ADMISSION DATE:  02/03/2020,, CHIEF COMPLAINT:   Brief History   86 y/o presented with severe sepsis from renal calculi.  History of present illness   86y/o female that presented from nsg home for elevated trop.  The pt denied all physical complaints but was found to have temp of 104.  She is awake and alert but gets confused with timing and duration of events.  She states that has not had SOB/CP/ chest pressure/ vomiting/ abd pain/ diarrhea.  Pt was found to have renal calculi on CT of the abd. She denies dysuria.  Attempted to reach the POA but not able to.  The pt had had trop sent two days ago for chest pressure at that time but she denies that.    Past Medical History  Arthritis HTN Hypothyroidism Macular degeneration   Consults:  Urology Cardiology  Procedures:  12/10 - left percutaneous nephrostomy tube.   Significant Diagnostic Tests:  CT Abd Bilateral nephrolithiasis. 16 mm left renal pelvic stone with mild left hydronephrosis. Aortoiliac atherosclerosis.  Coronary artery disease. Colonic diverticulosis.  No active diverticulitis. Moderate stool in the rectosigmoid colon. Small left pleural effusion and trace right pleural effusion.  EKG 12/11 - NSR with RBBB. No STT abnormalities.   Echo 12/11 - PND  Micro Data:  Covid: negative Flu: negative  Antimicrobials:  Ceftriaxone   Objective   Blood pressure (!) 109/44, pulse 62, temperature (!) 97.5 F (36.4 C), resp. rate (!) 25, height 5\' 4"  (1.626 m), weight 57.6 kg, SpO2 99 %.        Intake/Output Summary (Last 24 hours) at 02/04/2020 1044 Last data filed at 02/04/2020 0942 Gross per 24 hour  Intake 1217.92 ml  Output 175 ml  Net 1042.92 ml   Filed Weights   02/03/20 1828 02/04/20 0129  Weight: 59.2 kg 57.6 kg    Examination: General: No acute distress, frail  HENT: Atraumatic, poor dentition.  Lungs: CTAB no wheezing rales or  rhonchi Cardiovascular:RR  4/6 SEM no rub or gallop Abdomen: soft inconsistent right lower tenderness. No rebound or guarding, no CVA tenderness. Nephrostomy tube draining dark-bloody fluid.  Extremities: No edema or cyanosis  Arthritic changes of the dip bilateral hands Neuro: awake alert mildly confused about the date and her age.  Motor grossly intact  GU: Purewick in place  Echo personally reviewed: normal EF, mild infero-apical hypokinesis. Moderate MR. Mild TR, moderate AS, Elevated left sided pressures. IVC normal   Assessment & Plan:  Severe Sepsis Pyelonephritis K. Pneumoniae bacteremia. Hydronephrosis Renal Calculi NSTEMI due to type 2 demand related injury AKI Frailty   Plan: Ready for transfer to med-surg Step down to ceftriaxone  Continue nephrostomy drainage per urology Medical management for demand related injury - ASA, statin. May need diuresis. Cardiology to see.  Best practice (evaluated daily)   Diet: full diet Pain/Anxiety/Delirium protocol (if indicated): N/A VAP protocol (if indicated): N/A DVT prophylaxis: Heparin GI prophylaxis: protonix Glucose control: monitor BS Mobility: ad lib Code Status: full code Disposition: transfer to 2W, TRH notified and orders reconciled.   Labs   CBC: Recent Labs  Lab 02/03/20 1720 02/04/20 0316  WBC 7.3 32.2*  NEUTROABS 6.0  --   HGB 11.8* 10.5*  HCT 36.8 34.6*  MCV 92.5 93.8  PLT 214 203    Basic Metabolic Panel: Recent Labs  Lab 02/03/20 1720 02/04/20 0316  NA 141 138  K 4.5 4.6  CL  106 105  CO2 20* 21*  GLUCOSE 110* 131*  BUN 37* 38*  CREATININE 1.59* 1.64*  CALCIUM 8.8* 8.5*  MG  --  1.9  PHOS  --  3.4   GFR: Estimated Creatinine Clearance: 18.5 mL/min (A) (by C-G formula based on SCr of 1.64 mg/dL (H)). Recent Labs  Lab 02/03/20 1720 02/03/20 2312 02/04/20 0316  WBC 7.3  --  32.2*  LATICACIDVEN 3.8* 1.9 2.3*    Liver Function Tests: Recent Labs  Lab 02/03/20 1720  AST 24   ALT 17  ALKPHOS 104  BILITOT 0.9  PROT 7.4  ALBUMIN 2.6*   No results for input(s): LIPASE, AMYLASE in the last 168 hours. No results for input(s): AMMONIA in the last 168 hours.  ABG No results found for: PHART, PCO2ART, PO2ART, HCO3, TCO2, ACIDBASEDEF, O2SAT   Coagulation Profile: Recent Labs  Lab 02/03/20 1720  INR 1.2    Cardiac Enzymes: No results for input(s): CKTOTAL, CKMB, CKMBINDEX, TROPONINI in the last 168 hours.  HbA1C: No results found for: HGBA1C  CBG: No results for input(s): GLUCAP in the last 168 hours.  Lynnell Catalan, MD [facility] ICU Physician [facility]  Critical Care  Pager: [phone] Or Epic Secure Chat After hours: [phone].  02/04/2020, 10:58 AM

## 2020-02-04 NOTE — Plan of Care (Signed)

## 2020-02-04 NOTE — Procedures (Signed)
Interventional Radiology Procedure Note  Procedure: [place] AND FLUORO 10 FR LEFT PCN    Complications: None  Estimated Blood Loss:  MIN  Findings:  LEFT UPJ STONE WITHOUT OBSTRUCTION, URINE CLEAR  10FR PCN INSERTED    Sharen Counter, MD

## 2020-02-05 DIAGNOSIS — R6520 Severe sepsis without septic shock: Secondary | ICD-10-CM

## 2020-02-05 DIAGNOSIS — N200 Calculus of kidney: Secondary | ICD-10-CM

## 2020-02-05 LAB — CBC
HCT: 30.4 % — ABNORMAL LOW (ref 36.0–46.0)
Hemoglobin: 9.8 g/dL — ABNORMAL LOW (ref 12.0–15.0)
MCH: 29.3 pg (ref 26.0–34.0)
MCHC: 32.2 g/dL (ref 30.0–36.0)
MCV: 91 fL (ref 80.0–100.0)
Platelets: 207 10*3/uL (ref 150–400)
RBC: 3.34 MIL/uL — ABNORMAL LOW (ref 3.87–5.11)
RDW: 14.3 % (ref 11.5–15.5)
WBC: 21 10*3/uL — ABNORMAL HIGH (ref 4.0–10.5)
nRBC: 0 % (ref 0.0–0.2)

## 2020-02-05 LAB — BASIC METABOLIC PANEL
Anion gap: 14 (ref 5–15)
BUN: 49 mg/dL — ABNORMAL HIGH (ref 8–23)
CO2: 20 mmol/L — ABNORMAL LOW (ref 22–32)
Calcium: 8.3 mg/dL — ABNORMAL LOW (ref 8.9–10.3)
Chloride: 107 mmol/L (ref 98–111)
Creatinine, Ser: 2.05 mg/dL — ABNORMAL HIGH (ref 0.44–1.00)
GFR, Estimated: 22 mL/min — ABNORMAL LOW (ref >60)
Glucose, Bld: 74 mg/dL (ref 70–99)
Potassium: 4 mmol/L (ref 3.5–5.1)
Sodium: 141 mmol/L (ref 135–145)

## 2020-02-05 LAB — URINE CULTURE

## 2020-02-05 MED ORDER — ENSURE ENLIVE PO LIQD
237.0000 mL | Freq: Two times a day (BID) | ORAL | Status: DC
  Administered 2020-02-06 – 2020-02-09 (×6): 237 mL via ORAL

## 2020-02-05 MED ORDER — ACETAMINOPHEN 325 MG PO TABS
650.0000 mg | ORAL_TABLET | Freq: Four times a day (QID) | ORAL | Status: DC | PRN
  Administered 2020-02-05 – 2020-02-07 (×5): 650 mg via ORAL
  Filled 2020-02-05 (×6): qty 2

## 2020-02-05 NOTE — Progress Notes (Addendum)
PROGRESS NOTE    [name]  ATF:[id number] DOB: 08/26/1924 DOA: 02/03/2020 PCP: Jarrett SohoWharton, Courtney, PA-C  Brief Narrative: [name] is a 86 year old female with history of hypertension, peripheral vascular disease, history of carotid endarterectomy was admitted to the ICU with septic shock, UTI complicated by hydronephrosis and obstructing renal calculus. -She was treated with pressors, antibiotics and IV fluids, was also noted to have been elevated troponin. -Seen by urology in consultation, recommended percutaneous nephrostomy tube placement which was completed in interventional radiology on 12/11 -Clinically stabilized, also complicated by delirium -[facility]   Assessment & Plan:   Septic shock Klebsiella bacteremia/UTI -Clinically improving, off pressors -Underwent percutaneous nephrostomy drain placement 12/11 in IR -Needs follow-up with urology in few weeks for definitive management of stone and nephrostomy drain  Delirium  Non-ST elevation MI  moderate aortic stenosis, Severe MR and moderate TR -Troponin trended up to 3587, 2D echocardiogram noted preserved EF, without regional wall motion abnormalities -Appreciate cardiology input, recommended conservative management given advanced age -Continue aspirin, Plavix, atenolol and statin  Hypothyroidism -Restart Synthroid 75 mcg daily  Bed bound/debilitated -PT/OT, resident of SNF  DVT prophylaxis: Heparin subcutaneous Code Status: DNR Family Communication: No family at bedside, called and updated patient's Ruthe MannanPastor Kelly Disposition Plan:  Status is: Inpatient  Remains inpatient appropriate because:Inpatient level of care appropriate due to severity of illness   Dispo: The patient is from: SNF              Anticipated d/c is to: SNF              Anticipated d/c date is: 2 days              Patient currently is not medically stable to d/c. Consultants:   Urology, cardiology,  PCCM   Procedures: Percutaneous nephrostomy drain in IR 12/11  Antimicrobials:    Subjective: -Complains of pain everywhere, refusing meds and food  Objective: Vitals:   02/04/20 0900 02/04/20 1300 02/04/20 2113 02/05/20 0636  BP: (!) 109/44 125/65 (!) 100/57 (!) 116/98  Pulse: 62 94 69 72  Resp: (!) 25 18 20 20   Temp:  (!) 97 F (36.1 C) 97.6 F (36.4 C) 98.4 F (36.9 C)  TempSrc:  Oral Oral Oral  SpO2: 99%  94%   Weight:      Height:        Intake/Output Summary (Last 24 hours) at 02/05/2020 1139 Last data filed at 02/05/2020 0835 Gross per 24 hour  Intake 620.49 ml  Output 250 ml  Net 370.49 ml   Filed Weights   02/03/20 1828 02/04/20 0129  Weight: 59.2 kg 57.6 kg    Examination:  General exam: Elderly pleasant female laying in bed, awake alert oriented to self only, pleasantly confused CVS: S1-S2, regular rate rhythm systolic and diastolic murmur noted Lungs: Few basilar rales Abdomen: Soft, mildly distended, bowel sounds present, left percutaneous nephrostomy drain noted Extremities: No edema  Skin: No rashes on exposed skin Psychiatry: Flat affect    Data Reviewed:   CBC: Recent Labs  Lab 02/03/20 1720 02/04/20 0316 02/05/20 0736  WBC 7.3 32.2* 21.0*  NEUTROABS 6.0  --   --   HGB 11.8* 10.5* 9.8*  HCT 36.8 34.6* 30.4*  MCV 92.5 93.8 91.0  PLT 214 203 207   Basic Metabolic Panel: Recent Labs  Lab 02/03/20 1720 02/04/20 0316 02/05/20 0736  NA 141 138 141  K 4.5 4.6 4.0  CL 106 105  107  CO2 20* 21* 20*  GLUCOSE 110* 131* 74  BUN 37* 38* 49*  CREATININE 1.59* 1.64* 2.05*  CALCIUM 8.8* 8.5* 8.3*  MG  --  1.9  --   PHOS  --  3.4  --    GFR: Estimated Creatinine Clearance: 14.8 mL/min (A) (by C-G formula based on SCr of 2.05 mg/dL (H)). Liver Function Tests: Recent Labs  Lab 02/03/20 1720  AST 24  ALT 17  ALKPHOS 104  BILITOT 0.9  PROT 7.4  ALBUMIN 2.6*   No results for input(s): LIPASE, AMYLASE in the last 168 hours. No  results for input(s): AMMONIA in the last 168 hours. Coagulation Profile: Recent Labs  Lab 02/03/20 1720  INR 1.2   Cardiac Enzymes: No results for input(s): CKTOTAL, CKMB, CKMBINDEX, TROPONINI in the last 168 hours. BNP (last 3 results) No results for input(s): PROBNP in the last 8760 hours. HbA1C: No results for input(s): HGBA1C in the last 72 hours. CBG: No results for input(s): GLUCAP in the last 168 hours. Lipid Profile: No results for input(s): CHOL, HDL, LDLCALC, TRIG, CHOLHDL, LDLDIRECT in the last 72 hours. Thyroid Function Tests: No results for input(s): TSH, T4TOTAL, FREET4, T3FREE, THYROIDAB in the last 72 hours. Anemia Panel: No results for input(s): VITAMINB12, FOLATE, FERRITIN, TIBC, IRON, RETICCTPCT in the last 72 hours. Urine analysis:    Component Value Date/Time   COLORURINE YELLOW 02/03/2020 2038   APPEARANCEUR CLOUDY (A) 02/03/2020 2038   LABSPEC 1.017 02/03/2020 2038   PHURINE 5.0 02/03/2020 2038   GLUCOSEU NEGATIVE 02/03/2020 2038   HGBUR MODERATE (A) 02/03/2020 2038   BILIRUBINUR NEGATIVE 02/03/2020 2038   KETONESUR NEGATIVE 02/03/2020 2038   PROTEINUR 100 (A) 02/03/2020 2038   UROBILINOGEN 0.2 04/10/2008 1519   NITRITE POSITIVE (A) 02/03/2020 2038   LEUKOCYTESUR LARGE (A) 02/03/2020 2038   Sepsis Labs: @LABRCNTIP (procalcitonin:4,lacticidven:4)  ) Recent Results (from the past 240 hour(s))  Urine culture     Status: Abnormal   Collection Time: 02/03/20  5:20 PM   Specimen: In/Out Cath Urine  Result Value Ref Range Status   Specimen Description IN/OUT CATH URINE  Final   Special Requests   Final    NONE Performed at [facility], [address] [place], [place]    Culture MULTIPLE SPECIES PRESENT, SUGGEST RECOLLECTION (A)  Final   Report Status 02/05/2020 FINAL  Final  Resp Panel by RT-PCR (Flu A&B, Covid) Nasopharyngeal Swab     Status: None   Collection Time: 02/03/20  5:24 PM   Specimen: Nasopharyngeal Swab;  Nasopharyngeal(NP) swabs in vial transport medium  Result Value Ref Range Status   SARS Coronavirus 2 by RT PCR NEGATIVE NEGATIVE Final    Comment: (NOTE) SARS-CoV-2 target nucleic acids are NOT DETECTED.  The SARS-CoV-2 RNA is generally detectable in upper respiratory specimens during the acute phase of infection. The lowest concentration of SARS-CoV-2 viral copies this assay can detect is 138 copies/mL. A negative result does not preclude SARS-Cov-2 infection and should not be used as the sole basis for treatment or other patient management decisions. A negative result may occur with  improper specimen collection/handling, submission of specimen other than nasopharyngeal swab, presence of viral mutation(s) within the areas targeted by this assay, and inadequate number of viral copies(<138 copies/mL). A negative result must be combined with clinical observations, patient history, and epidemiological information. The expected result is Negative.  Fact Sheet for Patients:  14/10/21  Fact Sheet for Healthcare Providers:  BloggerCourse.com  This test is no t yet approved or cleared by the [place] and  has been authorized for detection and/or diagnosis of SARS-CoV-2 by FDA under an Emergency Use Authorization (EUA). This EUA will remain  in effect (meaning this test can be used) for the duration of the COVID-19 declaration under Section 564(b)(1) of the Act, 21 U.S.C.section 360bbb-3(b)(1), unless the authorization is terminated  or revoked sooner.       Influenza A by PCR NEGATIVE NEGATIVE Final   Influenza B by PCR NEGATIVE NEGATIVE Final    Comment: (NOTE) The Xpert Xpress SARS-CoV-2/FLU/RSV plus assay is intended as an aid in the diagnosis of influenza from Nasopharyngeal swab specimens and should not be used as a sole basis for treatment. Nasal washings and aspirates are unacceptable for Xpert Xpress  SARS-CoV-2/FLU/RSV testing.  Fact Sheet for Patients: BloggerCourse.com  Fact Sheet for Healthcare Providers: SeriousBroker.it  This test is not yet approved or cleared by the [place] FDA and has been authorized for detection and/or diagnosis of SARS-CoV-2 by FDA under an Emergency Use Authorization (EUA). This EUA will remain in effect (meaning this test can be used) for the duration of the COVID-19 declaration under Section 564(b)(1) of the Act, 21 U.S.C. section 360bbb-3(b)(1), unless the authorization is terminated or revoked.  Performed at [facility], [address], [place], [place] [address]   Blood Culture (routine x 2)     Status: Abnormal (Preliminary result)   Collection Time: 02/03/20  5:36 PM   Specimen: BLOOD RIGHT WRIST  Result Value Ref Range Status   Specimen Description BLOOD RIGHT WRIST  Final   Special Requests   Final    BOTTLES DRAWN AEROBIC AND ANAEROBIC Blood Culture adequate volume   Culture  Setup Time   Final    IN BOTH AEROBIC AND ANAEROBIC BOTTLES GRAM NEGATIVE RODS Organism ID to follow CRITICAL RESULT CALLED TO, READ BACK BY AND VERIFIED WITH: PHARMD CATHY PIERCE [id number] FCP    Culture (A)  Final    KLEBSIELLA PNEUMONIAE SUSCEPTIBILITIES TO FOLLOW Performed at [facility], [address], [place], [place] [address]    Report Status PENDING  Incomplete  Blood Culture ID Panel (Reflexed)     Status: Abnormal   Collection Time: 02/03/20  5:36 PM  Result Value Ref Range Status   Enterococcus faecalis NOT DETECTED NOT DETECTED Final   Enterococcus Faecium NOT DETECTED NOT DETECTED Final   Listeria monocytogenes NOT DETECTED NOT DETECTED Final   Staphylococcus species NOT DETECTED NOT DETECTED Final   Staphylococcus aureus (BCID) NOT DETECTED NOT DETECTED Final   Staphylococcus epidermidis NOT DETECTED NOT DETECTED Final   Staphylococcus lugdunensis NOT DETECTED NOT  DETECTED Final   Streptococcus species NOT DETECTED NOT DETECTED Final   Streptococcus agalactiae NOT DETECTED NOT DETECTED Final   Streptococcus pneumoniae NOT DETECTED NOT DETECTED Final   Streptococcus pyogenes NOT DETECTED NOT DETECTED Final   A.calcoaceticus-baumannii NOT DETECTED NOT DETECTED Final   Bacteroides fragilis NOT DETECTED NOT DETECTED Final   Enterobacterales DETECTED (A) NOT DETECTED Final    Comment: Enterobacterales represent a large order of gram negative bacteria, not a single organism. CRITICAL RESULT CALLED TO, READ BACK BY AND VERIFIED WITH: PHARMD MORGAN HICKS [id number] FCP    Enterobacter cloacae complex NOT DETECTED NOT DETECTED Final   Escherichia coli NOT DETECTED NOT DETECTED Final   Klebsiella aerogenes NOT DETECTED NOT DETECTED Final   Klebsiella oxytoca NOT DETECTED NOT DETECTED Final  Klebsiella pneumoniae DETECTED (A) NOT DETECTED Final    Comment: CRITICAL RESULT CALLED TO, READ BACK BY AND VERIFIED WITH: PHARMD MORGAN HICKS [id number] FCP    Proteus species NOT DETECTED NOT DETECTED Final   Salmonella species NOT DETECTED NOT DETECTED Final   Serratia marcescens NOT DETECTED NOT DETECTED Final   Haemophilus influenzae NOT DETECTED NOT DETECTED Final   Neisseria meningitidis NOT DETECTED NOT DETECTED Final   Pseudomonas aeruginosa NOT DETECTED NOT DETECTED Final   Stenotrophomonas maltophilia NOT DETECTED NOT DETECTED Final   Candida albicans NOT DETECTED NOT DETECTED Final   Candida auris NOT DETECTED NOT DETECTED Final   Candida glabrata NOT DETECTED NOT DETECTED Final   Candida krusei NOT DETECTED NOT DETECTED Final   Candida parapsilosis NOT DETECTED NOT DETECTED Final   Candida tropicalis NOT DETECTED NOT DETECTED Final   Cryptococcus neoformans/gattii NOT DETECTED NOT DETECTED Final   CTX-M ESBL NOT DETECTED NOT DETECTED Final   Carbapenem resistance IMP NOT DETECTED NOT DETECTED Final   Carbapenem resistance KPC NOT DETECTED NOT  DETECTED Final   Carbapenem resistance NDM NOT DETECTED NOT DETECTED Final   Carbapenem resist OXA 48 LIKE NOT DETECTED NOT DETECTED Final   Carbapenem resistance VIM NOT DETECTED NOT DETECTED Final    Comment: Performed at [facility], [address], [place], [place] [address]  Blood Culture (routine x 2)     Status: None (Preliminary result)   Collection Time: 02/03/20  5:49 PM   Specimen: BLOOD RIGHT FOREARM  Result Value Ref Range Status   Specimen Description BLOOD RIGHT FOREARM  Final   Special Requests   Final    BOTTLES DRAWN AEROBIC AND ANAEROBIC Blood Culture adequate volume   Culture  Setup Time   Final    GRAM NEGATIVE RODS IN BOTH AEROBIC AND ANAEROBIC BOTTLES CRITICAL VALUE NOTED.  VALUE IS CONSISTENT WITH PREVIOUSLY REPORTED AND CALLED VALUE. Performed at [facility], [address], [place], [place] [address]    Culture GRAM NEGATIVE RODS  Final   Report Status PENDING  Incomplete  MRSA PCR Screening     Status: None   Collection Time: 02/04/20  1:45 AM   Specimen: Nasopharyngeal  Result Value Ref Range Status   MRSA by PCR NEGATIVE NEGATIVE Final    Comment:        The GeneXpert MRSA Assay (FDA approved for NASAL specimens only), is one component of a comprehensive MRSA colonization surveillance program. It is not intended to diagnose MRSA infection nor to guide or monitor treatment for MRSA infections. Performed at [facility], [address] [place], [place] [address]          Radiology Studies: CT ABDOMEN PELVIS W CONTRAST  Result Date: 02/03/2020 CLINICAL DATA:  Abdominal pain, fever EXAM: CT ABDOMEN AND PELVIS WITH CONTRAST TECHNIQUE: Multidetector CT imaging of the abdomen and pelvis was performed using the standard protocol following bolus administration of intravenous contrast. CONTRAST:  75mL OMNIPAQUE IOHEXOL 300 MG/ML  SOLN COMPARISON:  None. FINDINGS: Lower chest: Small left pleural effusion and trace right pleural  effusion. Dependent bibasilar atelectasis. Cardiomegaly. Densely calcified mitral valve, aortic valve, visualized coronary arteries and aorta. Hepatobiliary: No focal hepatic abnormality. Gallbladder unremarkable. Pancreas: No focal abnormality or ductal dilatation. Spleen: Calcifications in the spleen likely due to old insult. Normal size. Adrenals/Urinary Tract: Large left renal pelvic stone measures up to 1.6 cm. Mild hydronephrosis associated with this stone. Nonobstructing midpole right renal stone. Other nonobstructing  stones on the left. No ureteral stones. Adrenal glands and urinary bladder unremarkable. Upper pole cyst on the right measures 4.7 cm. Stomach/Bowel: Sigmoid diverticulosis. No active diverticulitis. Scattered diverticula also noted in the transverse colon and right colon. Moderate stool burden in the rectosigmoid colon. Stomach and small bowel decompressed, unremarkable. Vascular/Lymphatic: Aortoiliac atherosclerosis. No evidence of aneurysm or adenopathy. Reproductive: Calcified fibroids within the uterus. No adnexal mass. Other: No free fluid or free air. Musculoskeletal: No acute bony abnormality. IMPRESSION: Bilateral nephrolithiasis. 16 mm left renal pelvic stone with mild left hydronephrosis. Aortoiliac atherosclerosis.  Coronary artery disease. Colonic diverticulosis.  No active diverticulitis. Moderate stool in the rectosigmoid colon. Small left pleural effusion and trace right pleural effusion. Electronically Signed   By: Charlett Nose M.D.   On: 02/03/2020 20:05   IR US Guidance  Result Date: 02/04/2020 INDICATION: 16mm left UPJ stone, mild hydronephrosis, concern for urosepsis EXAM: ULTRASOUND AND FLUOROSCOPIC 10 FRENCH LEFT NEPHROSTOMY COMPARISON:  None. MEDICATIONS: 02/03/2020; The antibiotic was administered in an appropriate time frame prior to skin puncture. ANESTHESIA/SEDATION: Fentanyl 25 mcg IV; Versed 1.0 mg IV Moderate Sedation Time:  12 MINUTES The patient was  continuously monitored during the procedure by the interventional radiology nurse under my direct supervision. CONTRAST:  10 cc-administered into the collecting system(s) FLUOROSCOPY TIME:  Fluoroscopy Time: 3 minutes 12 seconds (20 mGy). COMPLICATIONS: None immediate. PROCEDURE: 86 year old female from a nursing home. No family member available. Several unsuccessful attempts were made to contact the power of attorney prior to the procedure. Therefore, emergent informed written consent was obtained from the patient after a discussion of the procedural risks, benefits and alternatives. All questions were addressed. Maximal Sterile Barrier Technique was utilized including caps, mask, sterile gowns, sterile gloves, sterile drape, hand hygiene and skin antiseptic. A timeout was performed prior to the initiation of the procedure. Previous imaging reviewed. Patient positioned prone. Limited ultrasound performed. The mildly hydronephrotic left kidney was localized and marked for a lower pole access. Under sterile conditions and local anesthesia, ultrasound percutaneous needle access performed of the left kidney lower pole mildly dilated calyx. Needle position confirmed with ultrasound. There was return of slight blood tinged nonpurulent urine. Contrast injection confirms mild hydronephrosis and a nonobstructing left UPJ stone. Contrast immediately drained past the UPJ stone into the proximal ureter. 018 guidewire inserted followed by the Accustick dilator set. Amplatz guidewire advanced. Tract dilatation performed to insert a 10 French nephrostomy. Retention LOOP was difficult to form within the mildly dilated collecting system. Contrast injection confirms position within the collecting system. Diffuse filling defect throughout the collecting system AND proximal ureter related to hemorrhage from the somewhat difficult tube placement. Catheter secured at the skin site with a Prolene suture and connected to external gravity  drainage bag. Sterile dressing applied. No immediate complication. Patient tolerated the procedure well. IMPRESSION: Successful ultrasound and fluoroscopic 10 French left nephrostomy insertion. 16 mm nonobstructing left UPJ calculus. Electronically Signed   By: Judie Petit.  Shick M.D.   On: 02/04/2020 08:35   DG Chest Port 1 View  Result Date: 02/03/2020 CLINICAL DATA:  Questionable sepsis. EXAM: PORTABLE CHEST 1 VIEW COMPARISON:  January 14, 2018 FINDINGS: The lungs are hyperinflated. There is no evidence of acute infiltrate, pleural effusion or pneumothorax. The cardiac silhouette is mildly enlarged and unchanged in size. There is marked severity calcification of the aortic arch. Degenerative changes seen throughout the thoracic spine. IMPRESSION: Stable exam without acute or active cardiopulmonary disease. Electronically Signed   By: Aram Candela  M.D.   On: 02/03/2020 18:21   ECHOCARDIOGRAM COMPLETE  Result Date: 02/04/2020    ECHOCARDIOGRAM REPORT   Patient Name:   CATHLIN BUCHAN Date of Exam: 02/04/2020 Medical Rec #:  [id number]  Height:       64.0 in Accession #:    [id number] Weight:       127.0 lb Date of Birth:  1924/06/16  BSA:          1.613 m Patient Age:    86 years   BP:           110/49 mmHg Patient Gender: F          HR:           64 bpm. Exam Location:  Inpatient Procedure: 2D Echo, Cardiac Doppler, Color Doppler and Intracardiac            Opacification Agent Indications:    Elevated troponin  History:        Patient has no prior history of Echocardiogram examinations.                 Risk Factors:Hypertension. E. coli UTI.  Sonographer:    Ross Ludwig RDCS (AE) Referring Phys: [id number] CHRISTOPHER R DOROTHY IMPRESSIONS  1. Left ventricular ejection fraction, by estimation, is 55 to 60%. The left ventricle has normal function. The left ventricle has no regional wall motion abnormalities. Left ventricular diastolic parameters are indeterminate.  2. Right ventricular systolic function is normal. The  right ventricular size is normal.  3. Left atrial size was mildly dilated.  4. The mitral valve is degenerative. Moderate mitral valve regurgitation. No evidence of mitral stenosis. Severe mitral annular calcification.  5. Tricuspid valve regurgitation is moderate.  6. The aortic valve is tricuspid. There is moderate calcification of the aortic valve. There is moderate thickening of the aortic valve. Aortic valve regurgitation is mild. Moderate aortic valve stenosis.  7. The inferior vena cava is normal in size with greater than 50% respiratory variability, suggesting right atrial pressure of 3 mmHg. FINDINGS  Left Ventricle: Left ventricular ejection fraction, by estimation, is 55 to 60%. The left ventricle has normal function. The left ventricle has no regional wall motion abnormalities. Definity contrast agent was given IV to delineate the left ventricular  endocardial borders. The left ventricular internal cavity size was normal in size. There is no left ventricular hypertrophy. Left ventricular diastolic parameters are indeterminate. Right Ventricle: The right ventricular size is normal. No increase in right ventricular wall thickness. Right ventricular systolic function is normal. Left Atrium: Left atrial size was mildly dilated. Right Atrium: Right atrial size was normal in size. Pericardium: There is no evidence of pericardial effusion. Mitral Valve: The mitral valve is degenerative in appearance. There is severe thickening of the mitral valve leaflet(s). There is severe calcification of the mitral valve leaflet(s). Severe mitral annular calcification. Moderate mitral valve regurgitation. No evidence of mitral valve stenosis. MV peak gradient, 12.2 mmHg. The mean mitral valve gradient is 4.0 mmHg. Tricuspid Valve: The tricuspid valve is normal in structure. Tricuspid valve regurgitation is moderate . No evidence of tricuspid stenosis. Aortic Valve: The aortic valve is tricuspid. There is moderate  calcification of the aortic valve. There is moderate thickening of the aortic valve. There is moderate aortic valve annular calcification. Aortic valve regurgitation is mild. Aortic regurgitation PHT measures 406 msec. Moderate aortic stenosis is present. Aortic valve mean gradient measures 29.0 mmHg. Aortic valve peak gradient measures 49.3 mmHg. Aortic valve area, by  VTI measures 0.45 cm. Pulmonic Valve: The pulmonic valve was normal in structure. Pulmonic valve regurgitation is not visualized. No evidence of pulmonic stenosis. Aorta: The aortic root is normal in size and structure. Venous: The inferior vena cava is normal in size with greater than 50% respiratory variability, suggesting right atrial pressure of 3 mmHg. IAS/Shunts: No atrial level shunt detected by color flow Doppler.  LEFT VENTRICLE PLAX 2D LVIDd:         4.50 cm  Diastology LVIDs:         3.10 cm  LV e' medial:    4.12 cm/s LV PW:         1.70 cm  LV E/e' medial:  39.3 LV IVS:        1.10 cm  LV e' lateral:   6.25 cm/s LVOT diam:     1.70 cm  LV E/e' lateral: 25.9 LV SV:         46 LV SV Index:   28 LVOT Area:     2.27 cm  RIGHT VENTRICLE            IVC RV Basal diam:  3.20 cm    IVC diam: 1.90 cm RV S prime:     8.86 cm/s TAPSE (M-mode): 1.9 cm LEFT ATRIUM            Index       RIGHT ATRIUM           Index LA diam:      4.20 cm  2.60 cm/m  RA Area:     15.30 cm LA Vol (A4C): 104.0 ml 64.48 ml/m RA Volume:   38.20 ml  23.68 ml/m  AORTIC VALVE AV Area (Vmax):    0.61 cm AV Area (Vmean):   0.52 cm AV Area (VTI):     0.45 cm AV Vmax:           351.00 cm/s AV Vmean:          247.000 cm/s AV VTI:            1.030 m AV Peak Grad:      49.3 mmHg AV Mean Grad:      29.0 mmHg LVOT Vmax:         94.00 cm/s LVOT Vmean:        57.100 cm/s LVOT VTI:          0.202 m LVOT/AV VTI ratio: 0.20 AI PHT:            406 msec  AORTA Ao Root diam: 3.20 cm Ao Asc diam:  2.80 cm MITRAL VALVE                TRICUSPID VALVE MV Area (PHT): 3.42 cm     TR Peak  grad:   41.5 mmHg MV Peak grad:  12.2 mmHg    TR Vmax:        322.00 cm/s MV Mean grad:  4.0 mmHg MV Vmax:       1.75 m/s     SHUNTS MV Vmean:      91.8 cm/s    Systemic VTI:  0.20 m MV Decel Time: 222 msec     Systemic Diam: 1.70 cm MV E velocity: 162.00 cm/s MV A velocity: 131.00 cm/s MV E/A ratio:  1.24 Charlton Haws MD Electronically signed by Charlton Haws MD Signature Date/Time: 02/04/2020/11:44:46 AM    Final    IR NEPHROSTOMY PLACEMENT LEFT  Result Date: 02/04/2020 INDICATION: 16mm left  UPJ stone, mild hydronephrosis, concern for urosepsis EXAM: ULTRASOUND AND FLUOROSCOPIC 10 FRENCH LEFT NEPHROSTOMY COMPARISON:  None. MEDICATIONS: 02/03/2020; The antibiotic was administered in an appropriate time frame prior to skin puncture. ANESTHESIA/SEDATION: Fentanyl 25 mcg IV; Versed 1.0 mg IV Moderate Sedation Time:  12 MINUTES The patient was continuously monitored during the procedure by the interventional radiology nurse under my direct supervision. CONTRAST:  10 cc-administered into the collecting system(s) FLUOROSCOPY TIME:  Fluoroscopy Time: 3 minutes 12 seconds (20 mGy). COMPLICATIONS: None immediate. PROCEDURE: 86 year old female from a nursing home. No family member available. Several unsuccessful attempts were made to contact the power of attorney prior to the procedure. Therefore, emergent informed written consent was obtained from the patient after a discussion of the procedural risks, benefits and alternatives. All questions were addressed. Maximal Sterile Barrier Technique was utilized including caps, mask, sterile gowns, sterile gloves, sterile drape, hand hygiene and skin antiseptic. A timeout was performed prior to the initiation of the procedure. Previous imaging reviewed. Patient positioned prone. Limited ultrasound performed. The mildly hydronephrotic left kidney was localized and marked for a lower pole access. Under sterile conditions and local anesthesia, ultrasound percutaneous needle access  performed of the left kidney lower pole mildly dilated calyx. Needle position confirmed with ultrasound. There was return of slight blood tinged nonpurulent urine. Contrast injection confirms mild hydronephrosis and a nonobstructing left UPJ stone. Contrast immediately drained past the UPJ stone into the proximal ureter. 018 guidewire inserted followed by the Accustick dilator set. Amplatz guidewire advanced. Tract dilatation performed to insert a 10 French nephrostomy. Retention LOOP was difficult to form within the mildly dilated collecting system. Contrast injection confirms position within the collecting system. Diffuse filling defect throughout the collecting system AND proximal ureter related to hemorrhage from the somewhat difficult tube placement. Catheter secured at the skin site with a Prolene suture and connected to external gravity drainage bag. Sterile dressing applied. No immediate complication. Patient tolerated the procedure well. IMPRESSION: Successful ultrasound and fluoroscopic 10 French left nephrostomy insertion. 16 mm nonobstructing left UPJ calculus. Electronically Signed   By: Judie Petit.  Shick M.D.   On: 02/04/2020 08:35        Scheduled Meds: . aspirin EC  81 mg Oral Daily  . atenolol  25 mg Oral Daily  . atorvastatin  20 mg Oral Daily  . Chlorhexidine Gluconate Cloth  6 each Topical Q0600  . clopidogrel  75 mg Oral Daily  . feeding supplement  237 mL Oral BID BM  . heparin  5,000 Units Subcutaneous Q8H  . levothyroxine  75 mcg Oral QAC breakfast  . mouth rinse  15 mL Mouth Rinse BID  . melatonin  6 mg Oral QHS  . polyvinyl alcohol  1 drop Both Eyes Daily  . pramipexole  0.125 mg Oral QHS   Continuous Infusions: . cefTRIAXone (ROCEPHIN)  IV 2 g (02/05/20 0941)     LOS: 2 days    Time spent:  Zannie Cove, MD [facility]  02/05/2020, 11:39 AM

## 2020-02-05 NOTE — Progress Notes (Signed)
Patient did take am meds. Attempted to get patient to eat/drink but refused. Also stated "I'm not takin no more medicine either." MD aware. Patient does not have any family and her long-time pastor spoke with MD this am regarding patient.

## 2020-02-05 NOTE — Progress Notes (Signed)
Pt is not compliant with meds and care. She refused some of her meds and the ones she took she just chewed them. She will not drink water or eat anything.

## 2020-02-06 DIAGNOSIS — I214 Non-ST elevation (NSTEMI) myocardial infarction: Secondary | ICD-10-CM

## 2020-02-06 DIAGNOSIS — E440 Moderate protein-calorie malnutrition: Secondary | ICD-10-CM | POA: Insufficient documentation

## 2020-02-06 LAB — BASIC METABOLIC PANEL
Anion gap: 11 (ref 5–15)
BUN: 47 mg/dL — ABNORMAL HIGH (ref 8–23)
CO2: 22 mmol/L (ref 22–32)
Calcium: 8.4 mg/dL — ABNORMAL LOW (ref 8.9–10.3)
Chloride: 111 mmol/L (ref 98–111)
Creatinine, Ser: 1.69 mg/dL — ABNORMAL HIGH (ref 0.44–1.00)
GFR, Estimated: 28 mL/min — ABNORMAL LOW (ref >60)
Glucose, Bld: 115 mg/dL — ABNORMAL HIGH (ref 70–99)
Potassium: 3.7 mmol/L (ref 3.5–5.1)
Sodium: 144 mmol/L (ref 135–145)

## 2020-02-06 LAB — CBC
HCT: 29.1 % — ABNORMAL LOW (ref 36.0–46.0)
Hemoglobin: 9.6 g/dL — ABNORMAL LOW (ref 12.0–15.0)
MCH: 29.7 pg (ref 26.0–34.0)
MCHC: 33 g/dL (ref 30.0–36.0)
MCV: 90.1 fL (ref 80.0–100.0)
Platelets: 214 10*3/uL (ref 150–400)
RBC: 3.23 MIL/uL — ABNORMAL LOW (ref 3.87–5.11)
RDW: 14.3 % (ref 11.5–15.5)
WBC: 16.5 10*3/uL — ABNORMAL HIGH (ref 4.0–10.5)
nRBC: 0 % (ref 0.0–0.2)

## 2020-02-06 LAB — CULTURE, BLOOD (ROUTINE X 2)
Special Requests: ADEQUATE
Special Requests: ADEQUATE

## 2020-02-06 NOTE — Evaluation (Signed)
Physical Therapy Evaluation Patient Details Name: [name] MRN: [id number] DOB: [birth year] Today's Date: 02/06/2020   History of Present Illness  Pt is 86 yo female with PMH including HTN and PVD.  She was admitted to ICU with septic shock, UTI complicated by hydronephrosis and renal calculus.   Pt s/p nephrostomy tube on 02/04/20. Pt also found to have elvated troponin with NSTEMI - cardiology was consulted. Pt now transferred to med-surg floor.  Clinical Impression   Pt admitted with above diagnosis. Per chart pt is largely bedbound.  Spoke with POA who reports pt was getting up to w/c with assist (unsure how much assist) about a month ago but has been gradually declining.  Today, pt requiring total assist for bed mobility and was unable to sit EOB due to pain and will need assist of 2.  Assisted pt with AAROM exercises and educated on importance of ROM -reports painful/stiff but eased after gentle AAROM. Pt with limited rehab potential, but did set low level goals for bed/chair transfers.  Pt currently with functional limitations due to the deficits listed below (see PT Problem List). Pt will benefit from skilled PT to increase their independence and safety with mobility to allow return to SNF.        Follow Up Recommendations SNF    Equipment Recommendations  None recommended by PT    Recommendations for Other Services       Precautions / Restrictions Precautions Precautions: Fall Precaution Comments: nephrostomy tube      Mobility  Bed Mobility Overal bed mobility: Needs Assistance Bed Mobility: Rolling Rolling: Total assist         General bed mobility comments: Attempted supine to sit and sidelying to sit but requiring total A and pain limited - unable to get to EOB.   Positioned in chair position post therapy.    Transfers                 General transfer comment: deferred  Ambulation/Gait                Stairs            Wheelchair  Mobility    Modified Rankin (Stroke Patients Only)       Balance Overall balance assessment: Needs assistance     Sitting balance - Comments: Pt was unable to sit EOB due to pain                                     Pertinent Vitals/Pain Pain Assessment: Faces Faces Pain Scale: Hurts even more Pain Location: Back with attempted transfers and rolls; Knees with ROM Pain Descriptors / Indicators: Grimacing;Moaning Pain Intervention(s): Limited activity within patient's tolerance;Monitored during session;Repositioned;Relaxation    Home Living Family/patient expects to be discharged to:: Skilled nursing facility                 Additional Comments: Pt is resident of [name]    Prior Function Level of Independence: Needs assistance         Comments: Pt unable to provide accurate PLOF.  Per MD notes, pt largely bedbound.  Spoke with [name] (pt's POA who is her pastor, pt reported it was daughter) who reports pt has been declining over the past month, but states she used to get up to w/c with assist and participated with group activities.  Likely total care for ADLs.  Hand Dominance        Extremity/Trunk Assessment   Upper Extremity Assessment Upper Extremity Assessment: LUE deficits/detail;RUE deficits/detail RUE Deficits / Details: Pt with significant deformities in hands from arthritis and has limited shoulder ROM.  Strength is grossly 2-3/5 throughout LUE Deficits / Details: Pt with significant deformities in hands from arthritis and has limited shoulder ROM.  Strength is grossly 2-3/5 throughout    Lower Extremity Assessment Lower Extremity Assessment: LLE deficits/detail;RLE deficits/detail RLE Deficits / Details: ROM: ankle and hip WFL, knee to ~60 flexion limited by pain (improved wtih AAROM reps); MMT: grossly 2-3/5 throughout LLE Deficits / Details: ROM: ankle and hip WFL, knee to ~60 flexion limited by pain (improved wtih  AAROM reps); MMT: grossly 2-3/5 throughout    Cervical / Trunk Assessment Cervical / Trunk Assessment: Kyphotic;Other exceptions Cervical / Trunk Exceptions: Very stiff and painful trunk - unable to sit EOB  Communication   Communication: No difficulties  Cognition Arousal/Alertness: Awake/alert Behavior During Therapy: WFL for tasks assessed/performed Overall Cognitive Status: History of cognitive impairments - at baseline                                 General Comments: Pt was oriented to self, place, month/year, and somewhat of situation.  She had difficulty providing PLOF but did state she was at nursing home.  Pt reports her daughter is POA [name] ;however, spoke with Tresa Endo who reports she is her pastor.      General Comments      Exercises General Exercises - Lower Extremity Ankle Circles/Pumps: AAROM;Both;10 reps;Supine (AAROM for full ROM) Short Arc Quad: AAROM;Both;10 reps;Supine Heel Slides: AAROM;Both;10 reps;Supine Hip ABduction/ADduction: AAROM;Supine;Both;10 reps   Assessment/Plan    PT Assessment Patient needs continued PT services  PT Problem List Decreased strength;Decreased mobility;Decreased range of motion;Decreased activity tolerance;Decreased cognition;Decreased balance;Decreased knowledge of use of DME;Pain       PT Treatment Interventions DME instruction;Therapeutic activities;Therapeutic exercise;Patient/family education;Balance training;Functional mobility training;Wheelchair mobility training    PT Goals (Current goals can be found in the Care Plan section)  Acute Rehab PT Goals Patient Stated Goal: return to [facility] PT Goal Formulation: With patient Time For Goal Achievement: 02/20/20 Potential to Achieve Goals: Poor    Frequency Min 2X/week   Barriers to discharge        Co-evaluation               AM-PAC PT "6 Clicks" Mobility  Outcome Measure Help needed turning from your back to your side while in a  flat bed without using bedrails?: Total Help needed moving from lying on your back to sitting on the side of a flat bed without using bedrails?: Total Help needed moving to and from a bed to a chair (including a wheelchair)?: Total Help needed standing up from a chair using your arms (e.g., wheelchair or bedside chair)?: Total Help needed to walk in hospital room?: Total Help needed climbing 3-5 steps with a railing? : Total 6 Click Score: 6    End of Session   Activity Tolerance: Patient limited by pain Patient left: in bed;with call bell/phone within reach;with bed alarm set (chair position) Nurse Communication: Mobility status;Need for lift equipment PT Visit Diagnosis: Unsteadiness on feet (R26.81);Muscle weakness (generalized) (M62.81);Pain Pain - part of body:  (back)    Time: 7106-2694 PT Time Calculation (min) (ACUTE ONLY): 24 min   Charges:   PT Evaluation $PT  Eval Low Complexity: 1 Low PT Treatments $Therapeutic Exercise: 8-22 mins        Anise Salvo, PT Acute Rehab Services Pager [phone] [facility] [phone]    Rayetta Humphrey 02/06/2020, 4:29 PM

## 2020-02-06 NOTE — Progress Notes (Signed)
PROGRESS NOTE    [name]  MWU:[id number] DOB: 1924/12/28 DOA: 02/03/2020 PCP: Jarrett Soho, PA-C  Brief Narrative: [name] is a 86 year old female with history of hypertension, peripheral vascular disease, history of carotid endarterectomy, bedbound resident of [place], [place] was admitted to the ICU with septic shock, UTI complicated by hydronephrosis and obstructing renal calculus. -She was treated with pressors, antibiotics and IV fluids, was also noted to have been elevated troponin. -Seen by urology in consultation, recommended percutaneous nephrostomy tube placement which was completed in interventional radiology on 12/11 -Clinically stabilized, also complicated by delirium -Transferred from PCCM to [facility] service 12/12   Assessment & Plan:   Septic shock Klebsiella bacteremia/UTI -Stabilized, off pressors -Underwent percutaneous nephrostomy drain placement 12/11 in IR -Needs follow-up with urology in few weeks for definitive management of stone and nephrostomy drain -Blood cultures with Klebsiella resistant to ampicillin otherwise pansensitive, continue IV ceftriaxone for few more days and then transition to oral Keflex -PT OT -Palliative care discussion, see below  Non-ST elevation MI  moderate aortic stenosis, Severe MR and moderate TR -Troponin trended up to 3587, 2D echocardiogram noted preserved EF, without regional wall motion abnormalities -Appreciate cardiology input, recommended Palliative care eval -Continue aspirin, Plavix, atenolol and statin  Delirium -Stable today  Hypothyroidism -Restart Synthroid 75 mcg daily  Bed bound/debilitated -PT/OT, resident of SNF  DVT prophylaxis: Heparin subcutaneous Code Status: DNR Family Communication: No family at bedside, called and updated patient's [name] 12/12 Disposition Plan:  Status is: Inpatient  Remains inpatient appropriate because:Inpatient level of care appropriate due to severity of  illness   Dispo: The patient is from: SNF              Anticipated d/c is to: SNF              Anticipated d/c date is: 2 days              Patient currently is not medically stable to d/c. Consultants:   Urology, cardiology, PCCM   Procedures: Percutaneous nephrostomy drain in IR 12/11  Antimicrobials:    Subjective: -Feels okay, still not completely oriented and aware of hospitalization, oral intake is Poor  Objective: Vitals:   02/05/20 1342 02/05/20 2139 02/06/20 0538 02/06/20 0800  BP: 102/65 (!) 117/51    Pulse: 67 65 66   Resp: 20 (!) 22 17   Temp: 98.2 F (36.8 C) 98.9 F (37.2 C) 98 F (36.7 C)   TempSrc:  Oral Oral   SpO2: 97% 96% 96% 96%  Weight:      Height:        Intake/Output Summary (Last 24 hours) at 02/06/2020 1346 Last data filed at 02/06/2020 0900 Gross per 24 hour  Intake 500 ml  Output 400 ml  Net 100 ml   Filed Weights   02/03/20 1828 02/04/20 0129  Weight: 59.2 kg 57.6 kg    Examination:  General exam: Elderly pleasant female laying in bed, awake alert oriented to self, and partly to place only, cognitive deficits CVS: S1-S2, regular rhythm, systolic murmur noted Lungs: Rare basilar rales otherwise clear Abdomen: Soft, mildly distended, bowel sounds present, left percutaneous nephrostomy drain noted Extremities: No edema  Skin: No rashes on exposed skin Psychiatry: Flat affect    Data Reviewed:   CBC: Recent Labs  Lab 02/03/20 1720 02/04/20 0316 02/05/20 0736 02/06/20 0153  WBC 7.3 32.2* 21.0* 16.5*  NEUTROABS 6.0  --   --   --   HGB 11.8*  10.5* 9.8* 9.6*  HCT 36.8 34.6* 30.4* 29.1*  MCV 92.5 93.8 91.0 90.1  PLT 214 203 207 214   Basic Metabolic Panel: Recent Labs  Lab 02/03/20 1720 02/04/20 0316 02/05/20 0736 02/06/20 0153  NA 141 138 141 144  K 4.5 4.6 4.0 3.7  CL 106 105 107 111  CO2 20* 21* 20* 22  GLUCOSE 110* 131* 74 115*  BUN 37* 38* 49* 47*  CREATININE 1.59* 1.64* 2.05* 1.69*  CALCIUM 8.8* 8.5*  8.3* 8.4*  MG  --  1.9  --   --   PHOS  --  3.4  --   --    GFR: Estimated Creatinine Clearance: 18 mL/min (A) (by C-G formula based on SCr of 1.69 mg/dL (H)). Liver Function Tests: Recent Labs  Lab 02/03/20 1720  AST 24  ALT 17  ALKPHOS 104  BILITOT 0.9  PROT 7.4  ALBUMIN 2.6*   No results for input(s): LIPASE, AMYLASE in the last 168 hours. No results for input(s): AMMONIA in the last 168 hours. Coagulation Profile: Recent Labs  Lab 02/03/20 1720  INR 1.2   Cardiac Enzymes: No results for input(s): CKTOTAL, CKMB, CKMBINDEX, TROPONINI in the last 168 hours. BNP (last 3 results) No results for input(s): PROBNP in the last 8760 hours. HbA1C: No results for input(s): HGBA1C in the last 72 hours. CBG: No results for input(s): GLUCAP in the last 168 hours. Lipid Profile: No results for input(s): CHOL, HDL, LDLCALC, TRIG, CHOLHDL, LDLDIRECT in the last 72 hours. Thyroid Function Tests: No results for input(s): TSH, T4TOTAL, FREET4, T3FREE, THYROIDAB in the last 72 hours. Anemia Panel: No results for input(s): VITAMINB12, FOLATE, FERRITIN, TIBC, IRON, RETICCTPCT in the last 72 hours. Urine analysis:    Component Value Date/Time   COLORURINE YELLOW 02/03/2020 2038   APPEARANCEUR CLOUDY (A) 02/03/2020 2038   LABSPEC 1.017 02/03/2020 2038   PHURINE 5.0 02/03/2020 2038   GLUCOSEU NEGATIVE 02/03/2020 2038   HGBUR MODERATE (A) 02/03/2020 2038   BILIRUBINUR NEGATIVE 02/03/2020 2038   KETONESUR NEGATIVE 02/03/2020 2038   PROTEINUR 100 (A) 02/03/2020 2038   UROBILINOGEN 0.2 04/10/2008 1519   NITRITE POSITIVE (A) 02/03/2020 2038   LEUKOCYTESUR LARGE (A) 02/03/2020 2038   Sepsis Labs: @LABRCNTIP (procalcitonin:4,lacticidven:4)  ) Recent Results (from the past 240 hour(s))  Urine culture     Status: Abnormal   Collection Time: 02/03/20  5:20 PM   Specimen: In/Out Cath Urine  Result Value Ref Range Status   Specimen Description IN/OUT CATH URINE  Final   Special Requests    Final    NONE Performed at [facility], [address] [place], [place]    Culture MULTIPLE SPECIES PRESENT, SUGGEST RECOLLECTION (A)  Final   Report Status 02/05/2020 FINAL  Final  Resp Panel by RT-PCR (Flu A&B, Covid) Nasopharyngeal Swab     Status: None   Collection Time: 02/03/20  5:24 PM   Specimen: Nasopharyngeal Swab; Nasopharyngeal(NP) swabs in vial transport medium  Result Value Ref Range Status   SARS Coronavirus 2 by RT PCR NEGATIVE NEGATIVE Final    Comment: (NOTE) SARS-CoV-2 target nucleic acids are NOT DETECTED.  The SARS-CoV-2 RNA is generally detectable in upper respiratory specimens during the acute phase of infection. The lowest concentration of SARS-CoV-2 viral copies this assay can detect is 138 copies/mL. A negative result does not preclude SARS-Cov-2 infection and should not be used as the sole basis for treatment or other patient management decisions. A negative result may  occur with  improper specimen collection/handling, submission of specimen other than nasopharyngeal swab, presence of viral mutation(s) within the areas targeted by this assay, and inadequate number of viral copies(<138 copies/mL). A negative result must be combined with clinical observations, patient history, and epidemiological information. The expected result is Negative.  Fact Sheet for Patients:  BloggerCourse.com  Fact Sheet for Healthcare Providers:  SeriousBroker.it  This test is no t yet approved or cleared by the [place] FDA and  has been authorized for detection and/or diagnosis of SARS-CoV-2 by FDA under an Emergency Use Authorization (EUA). This EUA will remain  in effect (meaning this test can be used) for the duration of the COVID-19 declaration under Section 564(b)(1) of the Act, 21 U.S.C.section 360bbb-3(b)(1), unless the authorization is terminated  or revoked sooner.       Influenza A by PCR  NEGATIVE NEGATIVE Final   Influenza B by PCR NEGATIVE NEGATIVE Final    Comment: (NOTE) The Xpert Xpress SARS-CoV-2/FLU/RSV plus assay is intended as an aid in the diagnosis of influenza from Nasopharyngeal swab specimens and should not be used as a sole basis for treatment. Nasal washings and aspirates are unacceptable for Xpert Xpress SARS-CoV-2/FLU/RSV testing.  Fact Sheet for Patients: BloggerCourse.com  Fact Sheet for Healthcare Providers: SeriousBroker.it  This test is not yet approved or cleared by the [place] FDA and has been authorized for detection and/or diagnosis of SARS-CoV-2 by FDA under an Emergency Use Authorization (EUA). This EUA will remain in effect (meaning this test can be used) for the duration of the COVID-19 declaration under Section 564(b)(1) of the Act, 21 U.S.C. section 360bbb-3(b)(1), unless the authorization is terminated or revoked.  Performed at [facility], [address], [place], [place] [address]   Blood Culture (routine x 2)     Status: Abnormal   Collection Time: 02/03/20  5:36 PM   Specimen: BLOOD RIGHT WRIST  Result Value Ref Range Status   Specimen Description BLOOD RIGHT WRIST  Final   Special Requests   Final    BOTTLES DRAWN AEROBIC AND ANAEROBIC Blood Culture adequate volume   Culture  Setup Time   Final    IN BOTH AEROBIC AND ANAEROBIC BOTTLES GRAM NEGATIVE RODS Organism ID to follow CRITICAL RESULT CALLED TO, READ BACK BY AND VERIFIED WITH: PHARMD Jeanella Cara [phone] FCP Performed at [facility], [address] [place], [place] [address]    Culture KLEBSIELLA PNEUMONIAE (A)  Final   Report Status 02/06/2020 FINAL  Final   Organism ID, Bacteria KLEBSIELLA PNEUMONIAE  Final      Susceptibility   Klebsiella pneumoniae - MIC*    AMPICILLIN >=32 RESISTANT Resistant     CEFAZOLIN <=4 SENSITIVE Sensitive     CEFEPIME <=0.12 SENSITIVE Sensitive      CEFTAZIDIME <=1 SENSITIVE Sensitive     CEFTRIAXONE <=0.25 SENSITIVE Sensitive     CIPROFLOXACIN <=0.25 SENSITIVE Sensitive     GENTAMICIN <=1 SENSITIVE Sensitive     IMIPENEM <=0.25 SENSITIVE Sensitive     TRIMETH/SULFA <=20 SENSITIVE Sensitive     AMPICILLIN/SULBACTAM 4 SENSITIVE Sensitive     PIP/TAZO <=4 SENSITIVE Sensitive     * KLEBSIELLA PNEUMONIAE  Blood Culture ID Panel (Reflexed)     Status: Abnormal   Collection Time: 02/03/20  5:36 PM  Result Value Ref Range Status   Enterococcus faecalis NOT DETECTED NOT DETECTED Final   Enterococcus Faecium NOT DETECTED NOT DETECTED Final   Listeria monocytogenes NOT DETECTED  NOT DETECTED Final   Staphylococcus species NOT DETECTED NOT DETECTED Final   Staphylococcus aureus (BCID) NOT DETECTED NOT DETECTED Final   Staphylococcus epidermidis NOT DETECTED NOT DETECTED Final   Staphylococcus lugdunensis NOT DETECTED NOT DETECTED Final   Streptococcus species NOT DETECTED NOT DETECTED Final   Streptococcus agalactiae NOT DETECTED NOT DETECTED Final   Streptococcus pneumoniae NOT DETECTED NOT DETECTED Final   Streptococcus pyogenes NOT DETECTED NOT DETECTED Final   A.calcoaceticus-baumannii NOT DETECTED NOT DETECTED Final   Bacteroides fragilis NOT DETECTED NOT DETECTED Final   Enterobacterales DETECTED (A) NOT DETECTED Final    Comment: Enterobacterales represent a large order of gram negative bacteria, not a single organism. CRITICAL RESULT CALLED TO, READ BACK BY AND VERIFIED WITH: PHARMD MORGAN HICKS [phone] FCP    Enterobacter cloacae complex NOT DETECTED NOT DETECTED Final   Escherichia coli NOT DETECTED NOT DETECTED Final   Klebsiella aerogenes NOT DETECTED NOT DETECTED Final   Klebsiella oxytoca NOT DETECTED NOT DETECTED Final   Klebsiella pneumoniae DETECTED (A) NOT DETECTED Final    Comment: CRITICAL RESULT CALLED TO, READ BACK BY AND VERIFIED WITH: PHARMD MORGAN HICKS [phone] FCP    Proteus species NOT DETECTED NOT  DETECTED Final   Salmonella species NOT DETECTED NOT DETECTED Final   Serratia marcescens NOT DETECTED NOT DETECTED Final   Haemophilus influenzae NOT DETECTED NOT DETECTED Final   Neisseria meningitidis NOT DETECTED NOT DETECTED Final   Pseudomonas aeruginosa NOT DETECTED NOT DETECTED Final   Stenotrophomonas maltophilia NOT DETECTED NOT DETECTED Final   Candida albicans NOT DETECTED NOT DETECTED Final   Candida auris NOT DETECTED NOT DETECTED Final   Candida glabrata NOT DETECTED NOT DETECTED Final   Candida krusei NOT DETECTED NOT DETECTED Final   Candida parapsilosis NOT DETECTED NOT DETECTED Final   Candida tropicalis NOT DETECTED NOT DETECTED Final   Cryptococcus neoformans/gattii NOT DETECTED NOT DETECTED Final   CTX-M ESBL NOT DETECTED NOT DETECTED Final   Carbapenem resistance IMP NOT DETECTED NOT DETECTED Final   Carbapenem resistance KPC NOT DETECTED NOT DETECTED Final   Carbapenem resistance NDM NOT DETECTED NOT DETECTED Final   Carbapenem resist OXA 48 LIKE NOT DETECTED NOT DETECTED Final   Carbapenem resistance VIM NOT DETECTED NOT DETECTED Final    Comment: Performed at [facility], [address] [place], [place] [address]  Blood Culture (routine x 2)     Status: Abnormal   Collection Time: 02/03/20  5:49 PM   Specimen: BLOOD RIGHT FOREARM  Result Value Ref Range Status   Specimen Description BLOOD RIGHT FOREARM  Final   Special Requests   Final    BOTTLES DRAWN AEROBIC AND ANAEROBIC Blood Culture adequate volume   Culture  Setup Time   Final    GRAM NEGATIVE RODS IN BOTH AEROBIC AND ANAEROBIC BOTTLES CRITICAL VALUE NOTED.  VALUE IS CONSISTENT WITH PREVIOUSLY REPORTED AND CALLED VALUE.    Culture (A)  Final    KLEBSIELLA PNEUMONIAE SUSCEPTIBILITIES PERFORMED ON PREVIOUS CULTURE WITHIN THE LAST 5 DAYS. Performed at [facility], [address] [place], [place] [address]    Report Status 02/06/2020 FINAL  Final  MRSA PCR Screening     Status: None    Collection Time: 02/04/20  1:45 AM   Specimen: Nasopharyngeal  Result Value Ref Range Status   MRSA by PCR NEGATIVE NEGATIVE Final    Comment:        The GeneXpert MRSA Assay (FDA approved for  NASAL specimens only), is one component of a comprehensive MRSA colonization surveillance program. It is not intended to diagnose MRSA infection nor to guide or monitor treatment for MRSA infections. Performed at [facility], [address], [place], [place] [address]      Scheduled Meds: . aspirin EC  81 mg Oral Daily  . atenolol  25 mg Oral Daily  . atorvastatin  20 mg Oral Daily  . Chlorhexidine Gluconate Cloth  6 each Topical Q0600  . clopidogrel  75 mg Oral Daily  . feeding supplement  237 mL Oral BID BM  . heparin  5,000 Units Subcutaneous Q8H  . levothyroxine  75 mcg Oral QAC breakfast  . mouth rinse  15 mL Mouth Rinse BID  . melatonin  6 mg Oral QHS  . polyvinyl alcohol  1 drop Both Eyes Daily  . pramipexole  0.125 mg Oral QHS   Continuous Infusions: . cefTRIAXone (ROCEPHIN)  IV 2 g (02/06/20 1040)     LOS: 3 days    Time spent:  Zannie Cove, MD [facility]  02/06/2020, 1:46 PM

## 2020-02-06 NOTE — Plan of Care (Signed)
  Problem: Education: Goal: Knowledge of General Education information will improve Description Including pain rating scale, medication(s)/side effects and non-pharmacologic comfort measures Outcome: Progressing   

## 2020-02-06 NOTE — Plan of Care (Signed)

## 2020-02-06 NOTE — Progress Notes (Signed)
Cardiology Progress Note  Patient ID: [name] MRN: [id number] DOB: 1924-08-24 Date of Encounter: 02/06/2020  Primary Cardiologist: No primary care provider on file.  Subjective   Chief Complaint: Confused this AM  HPI: Confused this AM. No symptoms.   ROS:  All other ROS reviewed and negative. Pertinent positives noted in the HPI.     Inpatient Medications  Scheduled Meds: . aspirin EC  81 mg Oral Daily  . atenolol  25 mg Oral Daily  . atorvastatin  20 mg Oral Daily  . Chlorhexidine Gluconate Cloth  6 each Topical Q0600  . clopidogrel  75 mg Oral Daily  . feeding supplement  237 mL Oral BID BM  . heparin  5,000 Units Subcutaneous Q8H  . levothyroxine  75 mcg Oral QAC breakfast  . mouth rinse  15 mL Mouth Rinse BID  . melatonin  6 mg Oral QHS  . polyvinyl alcohol  1 drop Both Eyes Daily  . pramipexole  0.125 mg Oral QHS   Continuous Infusions: . cefTRIAXone (ROCEPHIN)  IV 2 g (02/05/20 0941)   PRN Meds: acetaminophen, docusate sodium, polyethylene glycol, traMADol   Vital Signs   Vitals:   02/05/20 0636 02/05/20 1342 02/05/20 2139 02/06/20 0538  BP: (!) 116/98 102/65 (!) 117/51   Pulse: 72 67 65 66  Resp: 20 20 (!) 22 17  Temp: 98.4 F (36.9 C) 98.2 F (36.8 C) 98.9 F (37.2 C) 98 F (36.7 C)  TempSrc: Oral  Oral Oral  SpO2:  97% 96% 96%  Weight:      Height:        Intake/Output Summary (Last 24 hours) at 02/06/2020 0947 Last data filed at 02/06/2020 0900 Gross per 24 hour  Intake 500 ml  Output 400 ml  Net 100 ml   Last 3 Weights 02/04/2020 02/03/2020 01/14/2018  Weight (lbs) 126 lb 15.8 oz 130 lb 8.2 oz 133 lb 6.1 oz  Weight (kg) 57.6 kg 59.2 kg 60.5 kg      Telemetry  Overnight telemetry shows sinus bradycardia, which I personally reviewed.   ECG  The most recent ECG shows sinus rhythm, right bundle branch block, which I personally reviewed.   Physical Exam   Vitals:   02/05/20 0636 02/05/20 1342 02/05/20 2139 02/06/20 0538  BP: (!)  116/98 102/65 (!) 117/51   Pulse: 72 67 65 66  Resp: 20 20 (!) 22 17  Temp: 98.4 F (36.9 C) 98.2 F (36.8 C) 98.9 F (37.2 C) 98 F (36.7 C)  TempSrc: Oral  Oral Oral  SpO2:  97% 96% 96%  Weight:      Height:         Intake/Output Summary (Last 24 hours) at 02/06/2020 0947 Last data filed at 02/06/2020 0900 Gross per 24 hour  Intake 500 ml  Output 400 ml  Net 100 ml    Last 3 Weights 02/04/2020 02/03/2020 01/14/2018  Weight (lbs) 126 lb 15.8 oz 130 lb 8.2 oz 133 lb 6.1 oz  Weight (kg) 57.6 kg 59.2 kg 60.5 kg    Body mass index is 21.8 kg/m.   General: Well nourished, well developed, in no acute distress Head: Atraumatic, normal size  Eyes: PEERLA, EOMI  Neck: Supple, no JVD Endocrine: No thryomegaly Cardiac: Normal S1, S2; regular rate and rhythm, loud harsh 3/6 holosystolic murmur Lungs: Clear to auscultation bilaterally, no wheezing, rhonchi or rales  Abd: Soft, nontender, no hepatomegaly  Ext: No edema, pulses 2+ Musculoskeletal: No deformities, BUE and BLE strength  normal and equal Skin: Warm and dry, no rashes   Neuro: Alert, awake oriented to person only  Labs  High Sensitivity Troponin:   Recent Labs  Lab 02/03/20 1720 02/03/20 1923 02/04/20 0316  TROPONINIHS 187* 1,988* 3,587*     Cardiac EnzymesNo results for input(s): TROPONINI in the last 168 hours. No results for input(s): TROPIPOC in the last 168 hours.  Chemistry Recent Labs  Lab 02/03/20 1720 02/04/20 0316 02/05/20 0736 02/06/20 0153  NA 141 138 141 144  K 4.5 4.6 4.0 3.7  CL 106 105 107 111  CO2 20* 21* 20* 22  GLUCOSE 110* 131* 74 115*  BUN 37* 38* 49* 47*  CREATININE 1.59* 1.64* 2.05* 1.69*  CALCIUM 8.8* 8.5* 8.3* 8.4*  PROT 7.4  --   --   --   ALBUMIN 2.6*  --   --   --   AST 24  --   --   --   ALT 17  --   --   --   ALKPHOS 104  --   --   --   BILITOT 0.9  --   --   --   GFRNONAA 30* 29* 22* 28*  ANIONGAP Hematology Recent Labs  Lab 02/04/20 0316  02/05/20 0736 02/06/20 0153  WBC 32.2* 21.0* 16.5*  RBC 3.69* 3.34* 3.23*  HGB 10.5* 9.8* 9.6*  HCT 34.6* 30.4* 29.1*  MCV 93.8 91.0 90.1  MCH 28.5 29.3 29.7  MCHC 30.3 32.2 33.0  RDW 13.8 14.3 14.3  PLT 203 207 214   BNPNo results for input(s): BNP, PROBNP in the last 168 hours.  DDimer No results for input(s): DDIMER in the last 168 hours.   Radiology  ECHOCARDIOGRAM COMPLETE  Result Date: 02/04/2020    ECHOCARDIOGRAM REPORT   Patient Name:   [name] Date of Exam: 02/04/2020 Medical Rec #:  [id number]  Height:       64.0 in Accession #:    [id number] Weight:       127.0 lb Date of Birth:  12-25-1924  BSA:          1.613 m Patient Age:    86 years   BP:           110/49 mmHg Patient Gender: F          HR:           64 bpm. Exam Location:  Inpatient Procedure: 2D Echo, Cardiac Doppler, Color Doppler and Intracardiac            Opacification Agent Indications:    Elevated troponin  History:        Patient has no prior history of Echocardiogram examinations.                 Risk Factors:Hypertension. E. coli UTI.  Sonographer:    Ross Ludwig RDCS (AE) Referring Phys: [id number] [name] IMPRESSIONS  1. Left ventricular ejection fraction, by estimation, is 55 to 60%. The left ventricle has normal function. The left ventricle has no regional wall motion abnormalities. Left ventricular diastolic parameters are indeterminate.  2. Right ventricular systolic function is normal. The right ventricular size is normal.  3. Left atrial size was mildly dilated.  4. The mitral valve is degenerative. Moderate mitral valve regurgitation. No evidence of mitral stenosis. Severe mitral annular calcification.  5. Tricuspid valve regurgitation is moderate.  6. The aortic valve is tricuspid. There is moderate calcification of the  aortic valve. There is moderate thickening of the aortic valve. Aortic valve regurgitation is mild. Moderate aortic valve stenosis.  7. The inferior vena cava is normal in size with  greater than 50% respiratory variability, suggesting right atrial pressure of 3 mmHg. FINDINGS  Left Ventricle: Left ventricular ejection fraction, by estimation, is 55 to 60%. The left ventricle has normal function. The left ventricle has no regional wall motion abnormalities. Definity contrast agent was given IV to delineate the left ventricular  endocardial borders. The left ventricular internal cavity size was normal in size. There is no left ventricular hypertrophy. Left ventricular diastolic parameters are indeterminate. Right Ventricle: The right ventricular size is normal. No increase in right ventricular wall thickness. Right ventricular systolic function is normal. Left Atrium: Left atrial size was mildly dilated. Right Atrium: Right atrial size was normal in size. Pericardium: There is no evidence of pericardial effusion. Mitral Valve: The mitral valve is degenerative in appearance. There is severe thickening of the mitral valve leaflet(s). There is severe calcification of the mitral valve leaflet(s). Severe mitral annular calcification. Moderate mitral valve regurgitation. No evidence of mitral valve stenosis. MV peak gradient, 12.2 mmHg. The mean mitral valve gradient is 4.0 mmHg. Tricuspid Valve: The tricuspid valve is normal in structure. Tricuspid valve regurgitation is moderate . No evidence of tricuspid stenosis. Aortic Valve: The aortic valve is tricuspid. There is moderate calcification of the aortic valve. There is moderate thickening of the aortic valve. There is moderate aortic valve annular calcification. Aortic valve regurgitation is mild. Aortic regurgitation PHT measures 406 msec. Moderate aortic stenosis is present. Aortic valve mean gradient measures 29.0 mmHg. Aortic valve peak gradient measures 49.3 mmHg. Aortic valve area, by VTI measures 0.45 cm. Pulmonic Valve: The pulmonic valve was normal in structure. Pulmonic valve regurgitation is not visualized. No evidence of pulmonic  stenosis. Aorta: The aortic root is normal in size and structure. Venous: The inferior vena cava is normal in size with greater than 50% respiratory variability, suggesting right atrial pressure of 3 mmHg. IAS/Shunts: No atrial level shunt detected by color flow Doppler.  LEFT VENTRICLE PLAX 2D LVIDd:         4.50 cm  Diastology LVIDs:         3.10 cm  LV e' medial:    4.12 cm/s LV PW:         1.70 cm  LV E/e' medial:  39.3 LV IVS:        1.10 cm  LV e' lateral:   6.25 cm/s LVOT diam:     1.70 cm  LV E/e' lateral: 25.9 LV SV:         46 LV SV Index:   28 LVOT Area:     2.27 cm  RIGHT VENTRICLE            IVC RV Basal diam:  3.20 cm    IVC diam: 1.90 cm RV S prime:     8.86 cm/s TAPSE (M-mode): 1.9 cm LEFT ATRIUM            Index       RIGHT ATRIUM           Index LA diam:      4.20 cm  2.60 cm/m  RA Area:     15.30 cm LA Vol (A4C): 104.0 ml 64.48 ml/m RA Volume:   38.20 ml  23.68 ml/m  AORTIC VALVE AV Area (Vmax):    0.61 cm AV Area (Vmean):   0.52  cm AV Area (VTI):     0.45 cm AV Vmax:           351.00 cm/s AV Vmean:          247.000 cm/s AV VTI:            1.030 m AV Peak Grad:      49.3 mmHg AV Mean Grad:      29.0 mmHg LVOT Vmax:         94.00 cm/s LVOT Vmean:        57.100 cm/s LVOT VTI:          0.202 m LVOT/AV VTI ratio: 0.20 AI PHT:            406 msec  AORTA Ao Root diam: 3.20 cm Ao Asc diam:  2.80 cm MITRAL VALVE                TRICUSPID VALVE MV Area (PHT): 3.42 cm     TR Peak grad:   41.5 mmHg MV Peak grad:  12.2 mmHg    TR Vmax:        322.00 cm/s MV Mean grad:  4.0 mmHg MV Vmax:       1.75 m/s     SHUNTS MV Vmean:      91.8 cm/s    Systemic VTI:  0.20 m MV Decel Time: 222 msec     Systemic Diam: 1.70 cm MV E velocity: 162.00 cm/s MV A velocity: 131.00 cm/s MV E/A ratio:  1.24 Charlton Haws MD Electronically signed by Charlton Haws MD Signature Date/Time: 02/04/2020/11:44:46 AM    Final     Cardiac Studies  TTE 02/04/2020  1. Left ventricular ejection fraction, by estimation, is 55 to 60%.  The  left ventricle has normal function. The left ventricle has no regional  wall motion abnormalities. Left ventricular diastolic parameters are  indeterminate.  2. Right ventricular systolic function is normal. The right ventricular  size is normal.  3. Left atrial size was mildly dilated.  4. The mitral valve is degenerative. Moderate mitral valve regurgitation.  No evidence of mitral stenosis. Severe mitral annular calcification.  5. Tricuspid valve regurgitation is moderate.  6. The aortic valve is tricuspid. There is moderate calcification of the  aortic valve. There is moderate thickening of the aortic valve. Aortic  valve regurgitation is mild. Moderate aortic valve stenosis.  7. The inferior vena cava is normal in size with greater than 50%  respiratory variability, suggesting right atrial pressure of 3 mmHg.   Patient Profile  [name] is a 86 y.o. female with hypertension, CAD status post CEA on the right with left ICA stenosis who was admitted for sepsis secondary to urinary source.  She is status post nephrostomy tubes.  Course complicated by non-STEMI.  Assessment & Plan   1. NSTEMI -Elevated troponin.  EKG without ischemic changes.  EF shows no regional wall motion normalities. -She is bedridden at baseline.  No chest pain.  No indication for aggressive treatment.  We will continue aspirin 81 mg a day and Plavix 75 mg a day. -She may continue her home atenolol -No invasive strategy is recommended.  No stress test is recommended.  I did discuss with her point of contact that palliative care and hospice are likely indicated.  2.  Valvular heart disease, suspect severe mitral regurgitation/moderate aortic stenosis -Loud holosystolic murmur.  Echo shows splay artifact.  Suspect she has severe mitral regurgitation.  Not a candidate for any aggressive  measures.  Not a candidate for MitraClip. -TEE would be needed to confirm this but she is not a candidate for aggressive  therapies. -She also has moderate aortic stenosis -Overall, not a candidate for anything.  She is bedridden.  I would recommend aggressive goals of care including palliative care.  I did discuss this with her point of contact.  [facility] will sign off.   Medication Recommendations: As above Other recommendations (labs, testing, etc): None Follow up as an outpatient: None needed.  For questions or updates, please contact [facility] Please consult [email or web address] for contact info under   Time Spent with Patient: I have spent a total of 25 minutes with patient reviewing hospital notes, telemetry, EKGs, labs and examining the patient as well as establishing an assessment and plan that was discussed with the patient.  > 50% of time was spent in direct patient care.    Signed, Lenna Gilford. Flora Lipps, MD [facility]  [facility]  02/06/2020 9:47 AM

## 2020-02-06 NOTE — Progress Notes (Signed)
°   02/06/20 1000  Clinical Encounter Type  Visited With Patient  Visit Type Initial  Referral From Nurse  Consult/Referral To Chaplain  Chaplain responded to consult request; Patient was asleep. Chaplain prayed at bedside.This note was prepared by Deneen Harts, M.Div..  For questions please contact by phone [phone].

## 2020-02-06 NOTE — Plan of Care (Signed)
  Problem: Education: Goal: Knowledge of General Education information will improve Description: Including pain rating scale, medication(s)/side effects and non-pharmacologic comfort measures 02/06/2020 1224 by Carloyn Manner I, LPN Outcome: Progressing 02/06/2020 1222 by Carloyn Manner I, LPN Outcome: Progressing 02/06/2020 1135 by Carloyn Manner I, LPN Outcome: Progressing   Problem: Health Behavior/Discharge Planning: Goal: Ability to manage health-related needs will improve Outcome: Progressing   Problem: Clinical Measurements: Goal: Ability to maintain clinical measurements within normal limits will improve Outcome: Progressing Goal: Will remain free from infection Outcome: Progressing Goal: Diagnostic test results will improve Outcome: Progressing Goal: Respiratory complications will improve Outcome: Progressing Goal: Cardiovascular complication will be avoided Outcome: Progressing   Problem: Activity: Goal: Risk for activity intolerance will decrease Outcome: Progressing   Problem: Nutrition: Goal: Adequate nutrition will be maintained Outcome: Progressing   Problem: Coping: Goal: Level of anxiety will decrease Outcome: Progressing   Problem: Elimination: Goal: Will not experience complications related to bowel motility Outcome: Progressing Goal: Will not experience complications related to urinary retention Outcome: Progressing   Problem: Pain Managment: Goal: General experience of comfort will improve Outcome: Progressing   Problem: Safety: Goal: Ability to remain free from injury will improve Outcome: Progressing   Problem: Skin Integrity: Goal: Risk for impaired skin integrity will decrease Outcome: Progressing   Problem: Fluid Volume: Goal: Hemodynamic stability will improve Outcome: Progressing   Problem: Clinical Measurements: Goal: Diagnostic test results will improve Outcome: Progressing Goal: Signs and symptoms of  infection will decrease Outcome: Progressing   Problem: Respiratory: Goal: Ability to maintain adequate ventilation will improve Outcome: Progressing

## 2020-02-06 NOTE — Progress Notes (Addendum)
Initial Nutrition Assessment  DOCUMENTATION CODES:   Non-severe (moderate) malnutrition in context of chronic illness  INTERVENTION:   Patient currently on Dysphagia 1 diet, recommend SLP evaluation    Ensure Enlive po BID, each supplement provides 350 kcal and 20 grams of protein  Magic cup BID with meals, each supplement provides 290 kcal and 9 grams of protein  MVI daily   NUTRITION DIAGNOSIS:   Moderate Malnutrition related to chronic illness (PVD s/p carotid endart) as evidenced by moderate fat depletion,severe muscle depletion.  GOAL:   Patient will meet greater than or equal to 90% of their needs  MONITOR:   PO intake,Supplement acceptance,Weight trends,Labs,I & O's  REASON FOR ASSESSMENT:   Malnutrition Screening Tool    ASSESSMENT:   Patient with PMH significant for HTN, PVD, carotid endarterectomy, and is bed bound. Presents this admission with NSTEMI and UTI.   12/11- L nephrostomy tube placed  Pt with delirium. Limited history provided. Denies loss in appetite PTA but unable to describe daily meal composition. Previously refusing meals this admission but has now consumed 25% of dinner last night. RD to provide supplementation to maximize kcal and protein this admission. If intake does not improve may need to address artifical nutrition in palliative meeting.   Records lack weight recordings over the last year. Pt denies recent wt loss but is unable to provide UBW.   Medications: reviewed  Labs: CBG 74-131  NUTRITION - FOCUSED PHYSICAL EXAM:  Flowsheet Row Most Recent Value  Orbital Region Moderate depletion  Upper Arm Region Moderate depletion  Thoracic and Lumbar Region Unable to assess  Buccal Region Moderate depletion  Temple Region Severe depletion  Clavicle Bone Region Severe depletion  Clavicle and Acromion Bone Region Severe depletion  Scapular Bone Region Unable to assess  Dorsal Hand Severe depletion  Patellar Region Mild depletion   Anterior Thigh Region Mild depletion  Posterior Calf Region Mild depletion  Edema (RD Assessment) Mild  Hair Reviewed  Eyes Reviewed  Mouth Reviewed  Skin Reviewed  Nails Reviewed     Diet Order:   Diet Order            DIET - DYS 1 Room service appropriate? Yes; Fluid consistency: Thin  Diet effective now                 EDUCATION NEEDS:   Not appropriate for education at this time  Skin:  Skin Assessment: Reviewed RN Assessment  Last BM:  PTA  Height:   Ht Readings from Last 1 Encounters:  02/03/20 5\' 4"  (1.626 m)    Weight:   Wt Readings from Last 1 Encounters:  02/04/20 57.6 kg    BMI:  Body mass index is 21.8 kg/m.  Estimated Nutritional Needs:   Kcal:  1500-1700 kcal  Protein:  75-90 grams  Fluid:  >/= 1.5 L/day  14/11/21 RD, LDN Clinical Nutrition Pager listed in AMION

## 2020-02-07 ENCOUNTER — Encounter (HOSPITAL_COMMUNITY): Payer: Self-pay

## 2020-02-07 DIAGNOSIS — R627 Adult failure to thrive: Secondary | ICD-10-CM

## 2020-02-07 DIAGNOSIS — Z515 Encounter for palliative care: Secondary | ICD-10-CM

## 2020-02-07 DIAGNOSIS — Z7189 Other specified counseling: Secondary | ICD-10-CM

## 2020-02-07 LAB — CBC
HCT: 32.6 % — ABNORMAL LOW (ref 36.0–46.0)
Hemoglobin: 10.5 g/dL — ABNORMAL LOW (ref 12.0–15.0)
MCH: 29.2 pg (ref 26.0–34.0)
MCHC: 32.2 g/dL (ref 30.0–36.0)
MCV: 90.6 fL (ref 80.0–100.0)
Platelets: 268 10*3/uL (ref 150–400)
RBC: 3.6 MIL/uL — ABNORMAL LOW (ref 3.87–5.11)
RDW: 14.5 % (ref 11.5–15.5)
WBC: 16.4 10*3/uL — ABNORMAL HIGH (ref 4.0–10.5)
nRBC: 0 % (ref 0.0–0.2)

## 2020-02-07 LAB — BASIC METABOLIC PANEL
Anion gap: 12 (ref 5–15)
BUN: 40 mg/dL — ABNORMAL HIGH (ref 8–23)
CO2: 22 mmol/L (ref 22–32)
Calcium: 8.4 mg/dL — ABNORMAL LOW (ref 8.9–10.3)
Chloride: 112 mmol/L — ABNORMAL HIGH (ref 98–111)
Creatinine, Ser: 1.42 mg/dL — ABNORMAL HIGH (ref 0.44–1.00)
GFR, Estimated: 34 mL/min — ABNORMAL LOW (ref >60)
Glucose, Bld: 117 mg/dL — ABNORMAL HIGH (ref 70–99)
Potassium: 3.5 mmol/L (ref 3.5–5.1)
Sodium: 146 mmol/L — ABNORMAL HIGH (ref 135–145)

## 2020-02-07 MED ORDER — CEPHALEXIN 500 MG PO CAPS
500.0000 mg | ORAL_CAPSULE | Freq: Three times a day (TID) | ORAL | Status: DC
  Administered 2020-02-09: 500 mg via ORAL
  Filled 2020-02-07: qty 1

## 2020-02-07 MED ORDER — CEFAZOLIN SODIUM-DEXTROSE 1-4 GM/50ML-% IV SOLN
1.0000 g | Freq: Two times a day (BID) | INTRAVENOUS | Status: AC
  Administered 2020-02-07 – 2020-02-08 (×4): 1 g via INTRAVENOUS
  Filled 2020-02-07 (×6): qty 50

## 2020-02-07 NOTE — TOC Initial Note (Addendum)
Transition of Care [facility]) - Initial/Assessment Note    Patient Details  Name: [name] MRN: [id number] Date of Birth: [birth year]  [facility] CM/SW Contact:    Lorri Frederick, LCSW Phone Number: 02/07/2020, 11:07 AM  Clinical Narrative:  CSW attempted to meet with pt regarding discharge but pt unable to participate.  Pt states she does not have family and face sheet does not list family.  Contact is pastor, [name] and CSW called and LM for her.  CSW was able to confirm with  Teena at [facility] that pt is current resident there and can return at discharge.         1200: CSW spoke with [name].  Pt has been at [facility] as long term resident for 2-3 years.  No family.  Pt is vaccinated.  Tresa Endo is a contact and will be at the hospital today.              Expected Discharge Plan: Skilled Nursing Facility Barriers to Discharge: Continued Medical Work up   Patient Goals and CMS Choice        Expected Discharge Plan and Services Expected Discharge Plan: Skilled Nursing Facility       Living arrangements for the past 2 months: Skilled Nursing Facility                                      Prior Living Arrangements/Services Living arrangements for the past 2 months: Skilled Nursing Facility Lives with:: Facility Resident Patient language and need for interpreter reviewed:: Yes        Need for Family Participation in Patient Care: Yes (Comment) Care giver support system in place?: No (comment) (reportedly local pastor is contact) Current home services: Other (comment) (na) Criminal Activity/Legal Involvement Pertinent to Current Situation/Hospitalization: No - Comment as needed  Activities of Daily Living Home Assistive Devices/Equipment: Wheelchair,Shower chair with back ADL Screening (condition at time of admission) Patient's cognitive ability adequate to safely complete daily activities?: No Is the patient deaf or have difficulty  hearing?: No Does the patient have difficulty seeing, even when wearing glasses/contacts?: No Does the patient have difficulty concentrating, remembering, or making decisions?: Yes Patient able to express need for assistance with ADLs?: No Does the patient have difficulty dressing or bathing?: Yes Independently performs ADLs?: No Does the patient have difficulty walking or climbing stairs?: Yes Weakness of Legs: Both Weakness of Arms/Hands: Both  Permission Sought/Granted                  Emotional Assessment Appearance:: Appears stated age Attitude/Demeanor/Rapport: Unable to Assess Affect (typically observed): Unable to Assess Orientation: : Oriented to Self,Oriented to Place Alcohol / Substance Use: Not Applicable Psych Involvement: No (comment)  Admission diagnosis:  Renal calculus [N20.0] Severe sepsis (HCC) [A41.9, R65.20] Patient Active Problem List   Diagnosis Date Noted  . Malnutrition of moderate degree 02/06/2020  . Severe sepsis (HCC) 02/03/2020  . Stage II pressure ulcer of sacral region (HCC) 01/15/2018  . AKI (acute kidney injury) (HCC) 01/15/2018  . Sepsis (HCC) 01/14/2018  . Mechanical Fall 12/15/2017  . E coli UTI (urinary tract infection) 12/15/2017  . ARF (acute renal failure) (HCC) 12/15/2017  . Hypertension 12/15/2017  . Hypothyroidism 12/15/2017  . Hyperlipidemia 12/15/2017  . Occlusion and stenosis of carotid artery without mention of cerebral infarction 06/09/2011   PCP:  Jarrett Soho, PA-C  Pharmacy:   [place], [place] [address] Phone: [phone] Fax: [phone]     Social Determinants of Health (SDOH) Interventions    Readmission Risk Interventions No flowsheet data found.

## 2020-02-07 NOTE — Evaluation (Signed)
Clinical/Bedside Swallow Evaluation Patient Details  Name: [name] MRN: [id number] Date of Birth: [birth year]  Today's Date: 02/07/2020 Time: SLP Start Time (ACUTE ONLY): 1130 SLP Stop Time (ACUTE ONLY): 1141 SLP Time Calculation (min) (ACUTE ONLY): 11 min  Past Medical History:  Past Medical History:  Diagnosis Date  . ARF (acute renal failure) ([facility]) 11/2017  . Arthritis   . Carotid artery occlusion    Bruit found August 2009  . Hypertension   . Hypothyroidism    Hypothyroidism  . Macular degeneration of left eye   . Multiple falls 12/16/2017  . UTI (urinary tract infection) 12/16/2017   Past Surgical History:  Past Surgical History:  Procedure Laterality Date  . APPENDECTOMY    . CAROTID ENDARTERECTOMY  04/14/2008   Right  . CATARACT EXTRACTION    . IR NEPHROSTOMY PLACEMENT LEFT  02/04/2020  . LUMBAR LAMINECTOMY     HPI:  86 year old female with history of hypertension, peripheral vascular disease, history of carotid endarterectomy, bedbound resident of [place], [place] was admitted 12/10 to the ICU with septic shock, UTI complicated by hydronephrosis and obstructing renal calculus. Generally bedbound.  Poor oral intake.  No hx of swallowing assessments in [facility] EMR.   Assessment / Plan / Recommendation Clinical Impression  Pt presented with functional swallowing.  Oral mechanism exam was normal. She required time and some repetitions to awaken and fully engage. Initially, she coughed several times with consumption of water, but then began to attend more fully, drank water and ate yogurt and had no further concerns for airway protection.  She was participatory and talkative.  Recommend continuing dysphagia 1 diet, thin liquids; give meds whole in applesauce.  There are no further acute SLP needs - our service will sign off. Spoke with Dr. Jomarie Longs and RN regarding the above. SLP Visit Diagnosis: Dysphagia, unspecified (R13.10)    Aspiration Risk  No limitations     Diet Recommendation   dysphagia 1, thin liquids  Medication Administration: Whole meds with puree    Other  Recommendations Oral Care Recommendations: Oral care BID   Follow up Recommendations        Frequency and Duration            Prognosis        Swallow Study   General HPI: 86 year old female with history of hypertension, peripheral vascular disease, history of carotid endarterectomy, bedbound resident of [place], [place] was admitted 12/10 to the ICU with septic shock, UTI complicated by hydronephrosis and obstructing renal calculus. Generally bedbound.  Poor oral intake.  No hx of swallowing assessments in [facility] EMR. Type of Study: Bedside Swallow Evaluation Previous Swallow Assessment: no Diet Prior to this Study: Dysphagia 1 (puree);Thin liquids Temperature Spikes Noted: No Respiratory Status: Room air History of Recent Intubation: No Behavior/Cognition: Alert;Pleasant mood;Confused Oral Cavity Assessment: Within Functional Limits Oral Care Completed by SLP: Recent completion by staff Oral Cavity - Dentition: Adequate natural dentition Vision: Functional for self-feeding Self-Feeding Abilities: Needs assist;Needs set up Patient Positioning: Upright in bed Baseline Vocal Quality: Normal Volitional Cough: Strong Volitional Swallow: Able to elicit    Oral/Motor/Sensory Function Overall Oral Motor/Sensory Function: Within functional limits   Ice Chips Ice chips: Within functional limits   Thin Liquid Thin Liquid: Within functional limits    Nectar Thick Nectar Thick Liquid: Not tested   Honey Thick Honey Thick Liquid: Not tested   Puree Puree: Within functional limits   Solid     Solid: Not tested  Blenda Mounts Laurice 02/07/2020,12:43 PM  Marchelle Folks L. Samson Frederic, MA CCC/SLP Acute Rehabilitation Services Office number [phone] Pager [phone]

## 2020-02-07 NOTE — Consult Note (Signed)
Consultation Note Date: 02/07/2020   Patient Name: [name]  DOB: 1924/04/09  MRN: [id number]  Age / Sex: 86 y.o., female  PCP: Marda Stalker, PA-C Referring Physician: Domenic Polite, MD  Reason for Consultation: Establishing goals of care  HPI/Patient Profile: 86 y.o. female  with past medical history of arthritis, hypertension, hypothyroidism, macular degeneration admitted on 02/03/2020 with severe sepsis with renal calculi with Klebsiella UTI and bacteremia with complications from NSTEMI and delirium. She has shown some level of improvement but still with overall failure to thrive.   Clinical Assessment and Goals of Care: I met today with [name]. No visitors at bedside. [name] confirms that she has no family. She tells me that her closest people are from her church. She gives me permission to call [name]. [name] is awake, pleasant, no distress but with underlying confusion. She is unable to tell me where she lives but does tell me that she likes it there and wants to return.   I was able to call and speak with Vassie [name], who is listed as her emergency contact. Speaking with [name] she shares that she is not documented HCPOA but has been trying to assist with [name] given lack of other family or persons in place to do so. I spoke with [name] about our concerns for poor intake and overall frailty that may lead to further decline, complications, infections. [name] does report that [name] ate well when she was present and assisting with feeding so I will ask nursing to assist with feeding and see if her intake improves. [name] does feel that [name] would want conservative treatment for complications but agrees with DNR and does not feel she would desire aggressive or invasive measures. We discussed addition of palliative care at [facility] and consideration of hospice  care in the near future and she is open to these options. If intake remains poor I will recommend consideration of hospice at this time.   Primary Decision Maker OTHER no family, her friend [name] acts on her behalf    SUMMARY OF RECOMMENDATIONS   - DNR - Continue current treatments - Not full comfort care - Open to hospice to follow at [facility] in the near future  Code Status/Advance Care Planning:  DNR   Symptom Management:   Per attending  Palliative Prophylaxis:   Aspiration, Bowel Regimen, Delirium Protocol, Frequent Pain Assessment, Oral Care and Palliative Wound Care  Prognosis:   Overall prognosis poor with failure to thrive and now with CAD/NSTEMI.   Discharge Planning: Return to SNF with palliative vs hospice.      Primary Diagnoses: Present on Admission: . Severe sepsis ([place])   I have reviewed the medical record, interviewed the patient and family, and examined the patient. The following aspects are pertinent.  Past Medical History:  Diagnosis Date  . ARF (acute renal failure) ([facility]) 11/2017  . Arthritis   . Carotid artery occlusion    Bruit found August 2009  .  Hypertension   . Hypothyroidism    Hypothyroidism  . Macular degeneration of left eye   . Multiple falls 12/16/2017  . UTI (urinary tract infection) 12/16/2017   Social History   Socioeconomic History  . Marital status: Single    Spouse name: Not on file  . Number of children: Not on file  . Years of education: Not on file  . Highest education level: Not on file  Occupational History  . Not on file  Tobacco Use  . Smoking status: Never Smoker  . Smokeless tobacco: Never Used  Vaping Use  . Vaping Use: Never used  Substance and Sexual Activity  . Alcohol use: No  . Drug use: No  . Sexual activity: Not Currently  Other Topics Concern  . Not on file  Social History Narrative  . Not on file   Social Determinants of Health   Financial Resource Strain: Not on  file  Food Insecurity: Not on file  Transportation Needs: Not on file  Physical Activity: Not on file  Stress: Not on file  Social Connections: Not on file   Family History  Problem Relation Age of Onset  . Heart disease Mother        After age 14  . Stroke Mother   . Heart attack Mother   . Hypertension Mother   . Other Mother        Amputation of Leg- Because of a fall in Nursing Home.   Scheduled Meds: . aspirin EC  81 mg Oral Daily  . atenolol  25 mg Oral Daily  . atorvastatin  20 mg Oral Daily  . [START ON 02/09/2020] cephALEXin  500 mg Oral Q8H  . Chlorhexidine Gluconate Cloth  6 each Topical Q0600  . clopidogrel  75 mg Oral Daily  . feeding supplement  237 mL Oral BID BM  . heparin  5,000 Units Subcutaneous Q8H  . levothyroxine  75 mcg Oral QAC breakfast  . mouth rinse  15 mL Mouth Rinse BID  . melatonin  6 mg Oral QHS  . polyvinyl alcohol  1 drop Both Eyes Daily  . pramipexole  0.125 mg Oral QHS   Continuous Infusions: .  ceFAZolin (ANCEF) IV     PRN Meds:.acetaminophen, docusate sodium, polyethylene glycol, traMADol No Known Allergies Review of Systems  Physical Exam  Vital Signs: BP 135/60 (BP Location: Left Arm)   Pulse 100   Temp 98 F (36.7 C) (Oral)   Resp 20   Ht 5' 4"  (1.626 m)   Wt 57.6 kg   SpO2 95%   BMI 21.80 kg/m  Pain Scale: PAINAD   Pain Score: 0-No pain   SpO2: SpO2: 95 % O2 Device:SpO2: 95 % O2 Flow Rate: .O2 Flow Rate (L/min): 1 L/min  IO: Intake/output summary:   Intake/Output Summary (Last 24 hours) at 02/07/2020 1011 Last data filed at 02/07/2020 0600 Gross per 24 hour  Intake 240 ml  Output 600 ml  Net -360 ml    LBM: Last BM Date:  (PTA) Baseline Weight: Weight: 59.2 kg Most recent weight: Weight: 57.6 kg     Palliative Assessment/Data:     Time In: 0920 Time Out: 1010 Time Total: 50 min Greater than 50%  of this time was spent counseling and coordinating care related to the above assessment and  plan.  Signed by: Vinie Sill, NP Palliative Medicine Team Pager # [phone] (M-F 8a-5p) Team Phone # [phone] (Nights/Weekends)

## 2020-02-07 NOTE — Progress Notes (Addendum)
PROGRESS NOTE    [name]  STM:[id number] DOB: [birth year] DOA: 02/03/2020 PCP: Jarrett Soho, PA-C  Brief Narrative: [name] is a 86 year old female with history of hypertension, peripheral vascular disease, history of carotid endarterectomy, bedbound resident of [place], [place] was admitted to the ICU with septic shock, UTI complicated by hydronephrosis and obstructing renal calculus. -She was treated with pressors, antibiotics and IV fluids, was also noted to have elevated troponin >3000. -Seen by urology in consultation, recommended percutaneous nephrostomy tube placement which was completed in interventional radiology on 12/11 -Clinically stabilized, also complicated by delirium -Transferred from PCCM to [facility] service 12/12 -Cardiac work-up noted severe mitral regurgitation and other valvular heart disease -Followed by cardiology, recommended palliative care   Assessment & Plan:   Septic shock Klebsiella bacteremia/UTI -Stabilized, off pressors -Underwent percutaneous nephrostomy drain placement 12/11 in IR -Needs follow-up with urology in few weeks for definitive management of stone and nephrostomy drain -Blood cultures with Klebsiella resistant to ampicillin otherwise pansensitive, day 5 of ceftriaxone will transition to IV Ancef for couple more days followed by oral Keflex to complete 10 days of antibiotics -PT OT -Palliative care discussion, see below  Non-ST elevation MI  Severe MR  Moderate aortic stenosis and moderate TR -Troponin trended up to 3587, 2D echocardiogram noted preserved EF, without regional wall motion abnormalities -Appreciate cardiology input, recommended Palliative care eval -Continue aspirin, Plavix, atenolol and statin -Might be appropriate for hospice  Moderate protein calorie malnutrition -Ongoing weight loss, temporal wasting -Dietitian consulted, supplements added  Adult failure to thrive  Delirium -Stable  today  Hypothyroidism -Continue Synthroid 75 mcg daily  Bed bound/debilitated -PT/OT, resident of SNF  DVT prophylaxis: Heparin subcutaneous Code Status: DNR Family Communication: No family at bedside, called and updated patient's [name] and main contact Kelly on 12/12, she has no family Disposition Plan:  Status is: Inpatient  Remains inpatient appropriate because:Inpatient level of care appropriate due to severity of illness   Dispo: The patient is from: SNF              Anticipated d/c is to: SNF              Anticipated d/c date is: 2 days              Patient currently is not medically stable to d/c. Consultants:   Urology, cardiology, PCCM   Procedures: Percutaneous nephrostomy drain in IR 12/11  Antimicrobials:    Subjective: -Complains of mild left flank discomfort at the site of the drain, -Staff reports very poor oral intake  Objective: Vitals:   02/06/20 1635 02/06/20 2000 02/06/20 2100 02/07/20 0300  BP: 126/74  (!) 141/58 135/60  Pulse:   (!) 102 100  Resp: 20  19 20   Temp: 98.8 F (37.1 C)  98.5 F (36.9 C) 98 F (36.7 C)  TempSrc:   Oral Oral  SpO2:  97% 97% 95%  Weight:      Height:        Intake/Output Summary (Last 24 hours) at 02/07/2020 1101 Last data filed at 02/07/2020 0600 Gross per 24 hour  Intake 240 ml  Output 600 ml  Net -360 ml   Filed Weights   02/03/20 1828 02/04/20 0129  Weight: 59.2 kg 57.6 kg    Examination:  General exam: Elderly frail female laying in bed awake alert oriented to self and partly to place, CVS: S1-S2, pansystolic murmur noted Lungs: Decreased breath sounds at the bases otherwise clear Abdomen: Soft, nontender, bowel  sounds present, left percutaneous nephrostomy drain noted Extremities: No edema  Skin: No rashes on exposed skin Psychiatry: Flat affect    Data Reviewed:   CBC: Recent Labs  Lab 02/03/20 1720 02/04/20 0316 02/05/20 0736 02/06/20 0153 02/07/20 0313  WBC 7.3 32.2* 21.0*  16.5* 16.4*  NEUTROABS 6.0  --   --   --   --   HGB 11.8* 10.5* 9.8* 9.6* 10.5*  HCT 36.8 34.6* 30.4* 29.1* 32.6*  MCV 92.5 93.8 91.0 90.1 90.6  PLT 214 203 207 214 268   Basic Metabolic Panel: Recent Labs  Lab 02/03/20 1720 02/04/20 0316 02/05/20 0736 02/06/20 0153 02/07/20 0313  NA 141 138 141 144 146*  K 4.5 4.6 4.0 3.7 3.5  CL 106 105 107 111 112*  CO2 20* 21* 20* 22 22  GLUCOSE 110* 131* 74 115* 117*  BUN 37* 38* 49* 47* 40*  CREATININE 1.59* 1.64* 2.05* 1.69* 1.42*  CALCIUM 8.8* 8.5* 8.3* 8.4* 8.4*  MG  --  1.9  --   --   --   PHOS  --  3.4  --   --   --    GFR: Estimated Creatinine Clearance: 21.4 mL/min (A) (by C-G formula based on SCr of 1.42 mg/dL (H)). Liver Function Tests: Recent Labs  Lab 02/03/20 1720  AST 24  ALT 17  ALKPHOS 104  BILITOT 0.9  PROT 7.4  ALBUMIN 2.6*   No results for input(s): LIPASE, AMYLASE in the last 168 hours. No results for input(s): AMMONIA in the last 168 hours. Coagulation Profile: Recent Labs  Lab 02/03/20 1720  INR 1.2   Cardiac Enzymes: No results for input(s): CKTOTAL, CKMB, CKMBINDEX, TROPONINI in the last 168 hours. BNP (last 3 results) No results for input(s): PROBNP in the last 8760 hours. HbA1C: No results for input(s): HGBA1C in the last 72 hours. CBG: No results for input(s): GLUCAP in the last 168 hours. Lipid Profile: No results for input(s): CHOL, HDL, LDLCALC, TRIG, CHOLHDL, LDLDIRECT in the last 72 hours. Thyroid Function Tests: No results for input(s): TSH, T4TOTAL, FREET4, T3FREE, THYROIDAB in the last 72 hours. Anemia Panel: No results for input(s): VITAMINB12, FOLATE, FERRITIN, TIBC, IRON, RETICCTPCT in the last 72 hours. Urine analysis:    Component Value Date/Time   COLORURINE YELLOW 02/03/2020 2038   APPEARANCEUR CLOUDY (A) 02/03/2020 2038   LABSPEC 1.017 02/03/2020 2038   PHURINE 5.0 02/03/2020 2038   GLUCOSEU NEGATIVE 02/03/2020 2038   HGBUR MODERATE (A) 02/03/2020 2038   BILIRUBINUR  NEGATIVE 02/03/2020 2038   KETONESUR NEGATIVE 02/03/2020 2038   PROTEINUR 100 (A) 02/03/2020 2038   UROBILINOGEN 0.2 04/10/2008 1519   NITRITE POSITIVE (A) 02/03/2020 2038   LEUKOCYTESUR LARGE (A) 02/03/2020 2038   Sepsis Labs: (procalcitonin:4,lacticidven:4)  ) Recent Results (from the past 240 hour(s))  Urine culture     Status: Abnormal   Collection Time: 02/03/20  5:20 PM   Specimen: In/Out Cath Urine  Result Value Ref Range Status   Specimen Description IN/OUT CATH URINE  Final   Special Requests   Final    NONE Performed at [facility], [address] [place], [place] [address]    Culture MULTIPLE SPECIES PRESENT, SUGGEST RECOLLECTION (A)  Final   Report Status 02/05/2020 FINAL  Final  Resp Panel by RT-PCR (Flu A&B, Covid) Nasopharyngeal Swab     Status: None   Collection Time: 02/03/20  5:24 PM   Specimen: Nasopharyngeal Swab; Nasopharyngeal(NP) swabs in vial transport medium  Result Value Ref Range Status   SARS Coronavirus 2 by RT PCR NEGATIVE NEGATIVE Final    Comment: (NOTE) SARS-CoV-2 target nucleic acids are NOT DETECTED.  The SARS-CoV-2 RNA is generally detectable in upper respiratory specimens during the acute phase of infection. The lowest concentration of SARS-CoV-2 viral copies this assay can detect is 138 copies/mL. A negative result does not preclude SARS-Cov-2 infection and should not be used as the sole basis for treatment or other patient management decisions. A negative result may occur with  improper specimen collection/handling, submission of specimen other than nasopharyngeal swab, presence of viral mutation(s) within the areas targeted by this assay, and inadequate number of viral copies(<138 copies/mL). A negative result must be combined with clinical observations, patient history, and epidemiological information. The expected result is Negative.  Fact Sheet for Patients:  BloggerCourse.comhttps://www.fda.gov/media/152166/download  Fact  Sheet for Healthcare Providers:  SeriousBroker.ithttps://www.fda.gov/media/152162/download  This test is no t yet approved or cleared by the [place] FDA and  has been authorized for detection and/or diagnosis of SARS-CoV-2 by FDA under an Emergency Use Authorization (EUA). This EUA will remain  in effect (meaning this test can be used) for the duration of the COVID-19 declaration under Section 564(b)(1) of the Act, 21 U.S.C.section 360bbb-3(b)(1), unless the authorization is terminated  or revoked sooner.       Influenza A by PCR NEGATIVE NEGATIVE Final   Influenza B by PCR NEGATIVE NEGATIVE Final    Comment: (NOTE) The Xpert Xpress SARS-CoV-2/FLU/RSV plus assay is intended as an aid in the diagnosis of influenza from Nasopharyngeal swab specimens and should not be used as a sole basis for treatment. Nasal washings and aspirates are unacceptable for Xpert Xpress SARS-CoV-2/FLU/RSV testing.  Fact Sheet for Patients: BloggerCourse.comhttps://www.fda.gov/media/152166/download  Fact Sheet for Healthcare Providers: SeriousBroker.ithttps://www.fda.gov/media/152162/download  This test is not yet approved or cleared by the [place] FDA and has been authorized for detection and/or diagnosis of SARS-CoV-2 by FDA under an Emergency Use Authorization (EUA). This EUA will remain in effect (meaning this test can be used) for the duration of the COVID-19 declaration under Section 564(b)(1) of the Act, 21 U.S.C. section 360bbb-3(b)(1), unless the authorization is terminated or revoked.  Performed at [facility], [address] [place], [place] [address]   Blood Culture (routine x 2)     Status: Abnormal   Collection Time: 02/03/20  5:36 PM   Specimen: BLOOD RIGHT WRIST  Result Value Ref Range Status   Specimen Description BLOOD RIGHT WRIST  Final   Special Requests   Final    BOTTLES DRAWN AEROBIC AND ANAEROBIC Blood Culture adequate volume   Culture  Setup Time   Final    IN BOTH AEROBIC AND ANAEROBIC BOTTLES GRAM  NEGATIVE RODS Organism ID to follow CRITICAL RESULT CALLED TO, READ BACK BY AND VERIFIED WITH: PHARMD Jeanella CaraATHY PIERCE [phone] FCP Performed at [facility], [address] [place], [place] [address]    Culture KLEBSIELLA PNEUMONIAE (A)  Final   Report Status 02/06/2020 FINAL  Final   Organism ID, Bacteria KLEBSIELLA PNEUMONIAE  Final      Susceptibility   Klebsiella pneumoniae - MIC*    AMPICILLIN >=32 RESISTANT Resistant     CEFAZOLIN <=4 SENSITIVE Sensitive     CEFEPIME <=0.12 SENSITIVE Sensitive     CEFTAZIDIME <=1 SENSITIVE Sensitive     CEFTRIAXONE <=0.25 SENSITIVE Sensitive     CIPROFLOXACIN <=0.25 SENSITIVE Sensitive     GENTAMICIN <=1 SENSITIVE Sensitive  IMIPENEM <=0.25 SENSITIVE Sensitive     TRIMETH/SULFA <=20 SENSITIVE Sensitive     AMPICILLIN/SULBACTAM 4 SENSITIVE Sensitive     PIP/TAZO <=4 SENSITIVE Sensitive     * KLEBSIELLA PNEUMONIAE  Blood Culture ID Panel (Reflexed)     Status: Abnormal   Collection Time: 02/03/20  5:36 PM  Result Value Ref Range Status   Enterococcus faecalis NOT DETECTED NOT DETECTED Final   Enterococcus Faecium NOT DETECTED NOT DETECTED Final   Listeria monocytogenes NOT DETECTED NOT DETECTED Final   Staphylococcus species NOT DETECTED NOT DETECTED Final   Staphylococcus aureus (BCID) NOT DETECTED NOT DETECTED Final   Staphylococcus epidermidis NOT DETECTED NOT DETECTED Final   Staphylococcus lugdunensis NOT DETECTED NOT DETECTED Final   Streptococcus species NOT DETECTED NOT DETECTED Final   Streptococcus agalactiae NOT DETECTED NOT DETECTED Final   Streptococcus pneumoniae NOT DETECTED NOT DETECTED Final   Streptococcus pyogenes NOT DETECTED NOT DETECTED Final   A.calcoaceticus-baumannii NOT DETECTED NOT DETECTED Final   Bacteroides fragilis NOT DETECTED NOT DETECTED Final   Enterobacterales DETECTED (A) NOT DETECTED Final    Comment: Enterobacterales represent a large order of gram negative bacteria, not a single  organism. CRITICAL RESULT CALLED TO, READ BACK BY AND VERIFIED WITH: PHARMD MORGAN HICKS [id number] FCP    Enterobacter cloacae complex NOT DETECTED NOT DETECTED Final   Escherichia coli NOT DETECTED NOT DETECTED Final   Klebsiella aerogenes NOT DETECTED NOT DETECTED Final   Klebsiella oxytoca NOT DETECTED NOT DETECTED Final   Klebsiella pneumoniae DETECTED (A) NOT DETECTED Final    Comment: CRITICAL RESULT CALLED TO, READ BACK BY AND VERIFIED WITH: PHARMD MORGAN HICKS [phone] FCP    Proteus species NOT DETECTED NOT DETECTED Final   Salmonella species NOT DETECTED NOT DETECTED Final   Serratia marcescens NOT DETECTED NOT DETECTED Final   Haemophilus influenzae NOT DETECTED NOT DETECTED Final   Neisseria meningitidis NOT DETECTED NOT DETECTED Final   Pseudomonas aeruginosa NOT DETECTED NOT DETECTED Final   Stenotrophomonas maltophilia NOT DETECTED NOT DETECTED Final   Candida albicans NOT DETECTED NOT DETECTED Final   Candida auris NOT DETECTED NOT DETECTED Final   Candida glabrata NOT DETECTED NOT DETECTED Final   Candida krusei NOT DETECTED NOT DETECTED Final   Candida parapsilosis NOT DETECTED NOT DETECTED Final   Candida tropicalis NOT DETECTED NOT DETECTED Final   Cryptococcus neoformans/gattii NOT DETECTED NOT DETECTED Final   CTX-M ESBL NOT DETECTED NOT DETECTED Final   Carbapenem resistance IMP NOT DETECTED NOT DETECTED Final   Carbapenem resistance KPC NOT DETECTED NOT DETECTED Final   Carbapenem resistance NDM NOT DETECTED NOT DETECTED Final   Carbapenem resist OXA 48 LIKE NOT DETECTED NOT DETECTED Final   Carbapenem resistance VIM NOT DETECTED NOT DETECTED Final    Comment: Performed at [facility], [address] [place], [place] [address]  Blood Culture (routine x 2)     Status: Abnormal   Collection Time: 02/03/20  5:49 PM   Specimen: BLOOD RIGHT FOREARM  Result Value Ref Range Status   Specimen Description BLOOD RIGHT FOREARM  Final   Special Requests    Final    BOTTLES DRAWN AEROBIC AND ANAEROBIC Blood Culture adequate volume   Culture  Setup Time   Final    GRAM NEGATIVE RODS IN BOTH AEROBIC AND ANAEROBIC BOTTLES CRITICAL VALUE NOTED.  VALUE IS CONSISTENT WITH PREVIOUSLY REPORTED AND CALLED VALUE.    Culture (A)  Final    KLEBSIELLA PNEUMONIAE SUSCEPTIBILITIES  PERFORMED ON PREVIOUS CULTURE WITHIN THE LAST 5 DAYS. Performed at [facility], [address], [place], [place] [address]    Report Status 02/06/2020 FINAL  Final  MRSA PCR Screening     Status: None   Collection Time: 02/04/20  1:45 AM   Specimen: Nasopharyngeal  Result Value Ref Range Status   MRSA by PCR NEGATIVE NEGATIVE Final    Comment:        The GeneXpert MRSA Assay (FDA approved for NASAL specimens only), is one component of a comprehensive MRSA colonization surveillance program. It is not intended to diagnose MRSA infection nor to guide or monitor treatment for MRSA infections. Performed at [facility], [address], [place], [place] [address]      Scheduled Meds: . aspirin EC  81 mg Oral Daily  . atenolol  25 mg Oral Daily  . atorvastatin  20 mg Oral Daily  . [START ON 02/09/2020] cephALEXin  500 mg Oral Q8H  . Chlorhexidine Gluconate Cloth  6 each Topical Q0600  . clopidogrel  75 mg Oral Daily  . feeding supplement  237 mL Oral BID BM  . heparin  5,000 Units Subcutaneous Q8H  . levothyroxine  75 mcg Oral QAC breakfast  . mouth rinse  15 mL Mouth Rinse BID  . melatonin  6 mg Oral QHS  . polyvinyl alcohol  1 drop Both Eyes Daily  . pramipexole  0.125 mg Oral QHS   Continuous Infusions: .  ceFAZolin (ANCEF) IV 1 g (02/07/20 1059)     LOS: 4 days    Time spent:  Zannie Cove, MD [facility]  02/07/2020, 11:01 AM

## 2020-02-07 NOTE — Progress Notes (Signed)
Pt is currently on D5 of ceftriaxone for her klebsiella bacteremia/UTI. It came back pan sens except for ampicillin. D/w Dr Jomarie Longs and we will transition her to 2 more days of cefazolin then followed by 3 days of Keflex to complete 10d of therapy.  Scr is trending down  Ulyses Southward, PharmD, BCIDP, AAHIVP, CPP Infectious Disease Pharmacist 02/07/2020 9:18 AM

## 2020-02-07 NOTE — Evaluation (Signed)
Occupational Therapy Evaluation Patient Details Name: [name] MRN: [id number] DOB: [birth year] Today's Date: 02/07/2020    History of Present Illness Pt is 86 yo female with PMH including HTN and PVD.  She was admitted to ICU with septic shock, UTI complicated by hydronephrosis and renal calculus.   Pt s/p nephrostomy tube on 02/04/20. Pt also found to have elvated troponin with NSTEMI - cardiology was consulted. Pt now transferred to med-surg floor.   Clinical Impression   Pt PTA: Pt living at [facility] and per chart was mostly dependent with ADL and bedbound pr w/c bound. Pt currently requiring totalA for self care and mobility; pt did not attempt to assist with bed mobility at this time. Pt extends trunk and legs at EOB and it is unsafe to continue to sit EOB. Pt appears to be close to baseline and has assistance at facility. Pt does not require continued OT skilled services. OT signing off. Thank you.    Follow Up Recommendations  No OT follow up;Supervision/Assistance - 24 hour (return to facility)    Equipment Recommendations  None recommended by OT    Recommendations for Other Services       Precautions / Restrictions Precautions Precautions: Fall Restrictions Weight Bearing Restrictions: No      Mobility Bed Mobility Overal bed mobility: Needs Assistance Bed Mobility: Rolling Rolling: Max assist         General bed mobility comments: maxA for rolling side to side    Transfers                 General transfer comment: deferred    Balance Overall balance assessment: Needs assistance     Sitting balance - Comments: Pt was unable to sit EOB due to pain                                   ADL either performed or assessed with clinical judgement   ADL Overall ADL's : Needs assistance/impaired;At baseline Eating/Feeding: Total assistance   Grooming: Total assistance   Upper Body Bathing: Total assistance   Lower Body Bathing:  Total assistance   Upper Body Dressing : Total assistance   Lower Body Dressing: Total assistance   Toilet Transfer: Total assistance;+2 for physical assistance   Toileting- Clothing Manipulation and Hygiene: Total assistance       Functional mobility during ADLs: Total assistance;+2 for physical assistance;+2 for safety/equipment General ADL Comments: Pt requiring totalA overall for ADL tasks; pt unable to sit EOB safely without going into extension, thus unsafe to attempt and unsure if pt was using lift to get OOB to w/c. Pt appears close to baseline functioning for ADL and ADL mobility.     Vision Baseline Vision/History: No visual deficits Vision Assessment?: No apparent visual deficits     Perception     Praxis      Pertinent Vitals/Pain Pain Assessment: Faces Faces Pain Scale: Hurts a little bit Pain Location: Back with attempted transfers and rolls; Knees with ROM Pain Descriptors / Indicators: Grimacing;Moaning Pain Intervention(s): Monitored during session;Premedicated before session     Hand Dominance Right   Extremity/Trunk Assessment Upper Extremity Assessment Upper Extremity Assessment: RUE deficits/detail;LUE deficits/detail RUE Deficits / Details: Limited shoulder ROM above 45* FF; arthritic hands RUE Coordination: decreased fine motor;decreased gross motor LUE Deficits / Details: Limited shoulder ROM above 45* FF; arthritic hands LUE Coordination: decreased fine motor;decreased gross motor   Lower  Extremity Assessment Lower Extremity Assessment: Generalized weakness   Cervical / Trunk Assessment Cervical / Trunk Assessment: Kyphotic;Other exceptions Cervical / Trunk Exceptions: Very stiff trunk - unable to sit EOB as pt going into extension   Communication Communication Communication: No difficulties   Cognition Arousal/Alertness: Awake/alert Behavior During Therapy: WFL for tasks assessed/performed Overall Cognitive Status: History of cognitive  impairments - at baseline                                     General Comments  Pt appeared to be conversant and alert, but overall confused.    Exercises     Shoulder Instructions      Home Living Family/patient expects to be discharged to:: Skilled nursing facility                                 Additional Comments: Pt is resident of [facility]      Prior Functioning/Environment Level of Independence: Needs assistance        Comments: Pt unable to provide accurate PLOF.  Per MD notes, pt largely bedbound.  PT spoke with [name] (pt's POA who is her pastor, pt reported it was daughter) who reports pt has been declining over the past month, but states she used to get up to w/c with assist and participated with group activities.  Likely total care for ADLs.        OT Problem List: Decreased strength;Decreased activity tolerance;Impaired balance (sitting and/or standing);Decreased safety awareness;Pain;Impaired UE functional use;Cardiopulmonary status limiting activity;Increased edema      OT Treatment/Interventions:      OT Goals(Current goals can be found in the care plan section) Acute Rehab OT Goals Patient Stated Goal: return to [facility]  OT Frequency:     Barriers to D/C:            Co-evaluation              AM-PAC OT "6 Clicks" Daily Activity     Outcome Measure Help from another person eating meals?: Total Help from another person taking care of personal grooming?: Total Help from another person toileting, which includes using toliet, bedpan, or urinal?: Total Help from another person bathing (including washing, rinsing, drying)?: Total Help from another person to put on and taking off regular upper body clothing?: Total Help from another person to put on and taking off regular lower body clothing?: Total 6 Click Score: 6   End of Session Nurse Communication: Mobility status  Activity Tolerance: Patient  tolerated treatment well Patient left: in bed;with call bell/phone within reach;with bed alarm set  OT Visit Diagnosis: Muscle weakness (generalized) (M62.81);Other abnormalities of gait and mobility (R26.89);Other symptoms and signs involving cognitive function                Time: 1243-1320 OT Time Calculation (min): 37 min Charges:  OT General Charges $OT Visit: 1 Visit OT Evaluation $OT Eval Moderate Complexity: 1 Mod OT Treatments $Self Care/Home Management : 8-22 mins  Flora Lipps, OTR/L Acute Rehabilitation Services Pager: [phone] Office: [phone]   Sarabella Caprio C 02/07/2020, 1:31 PM

## 2020-02-08 DIAGNOSIS — E440 Moderate protein-calorie malnutrition: Secondary | ICD-10-CM

## 2020-02-08 DIAGNOSIS — A414 Sepsis due to anaerobes: Secondary | ICD-10-CM

## 2020-02-08 DIAGNOSIS — R6521 Severe sepsis with septic shock: Secondary | ICD-10-CM

## 2020-02-08 LAB — COMPREHENSIVE METABOLIC PANEL
ALT: 13 U/L (ref 0–44)
AST: 28 U/L (ref 15–41)
Albumin: 2.3 g/dL — ABNORMAL LOW (ref 3.5–5.0)
Alkaline Phosphatase: 83 U/L (ref 38–126)
Anion gap: 10 (ref 5–15)
BUN: 30 mg/dL — ABNORMAL HIGH (ref 8–23)
CO2: 23 mmol/L (ref 22–32)
Calcium: 8.5 mg/dL — ABNORMAL LOW (ref 8.9–10.3)
Chloride: 111 mmol/L (ref 98–111)
Creatinine, Ser: 1.17 mg/dL — ABNORMAL HIGH (ref 0.44–1.00)
GFR, Estimated: 44 mL/min — ABNORMAL LOW (ref >60)
Glucose, Bld: 120 mg/dL — ABNORMAL HIGH (ref 70–99)
Potassium: 3.6 mmol/L (ref 3.5–5.1)
Sodium: 144 mmol/L (ref 135–145)
Total Bilirubin: 0.4 mg/dL (ref 0.3–1.2)
Total Protein: 6.8 g/dL (ref 6.5–8.1)

## 2020-02-08 LAB — CBC WITH DIFFERENTIAL/PLATELET
Abs Immature Granulocytes: 0.7 10*3/uL — ABNORMAL HIGH (ref 0.00–0.07)
Basophils Absolute: 0.1 10*3/uL (ref 0.0–0.1)
Basophils Relative: 1 %
Eosinophils Absolute: 0.5 10*3/uL (ref 0.0–0.5)
Eosinophils Relative: 3 %
HCT: 35.8 % — ABNORMAL LOW (ref 36.0–46.0)
Hemoglobin: 11 g/dL — ABNORMAL LOW (ref 12.0–15.0)
Immature Granulocytes: 4 %
Lymphocytes Relative: 11 %
Lymphs Abs: 1.9 10*3/uL (ref 0.7–4.0)
MCH: 28.4 pg (ref 26.0–34.0)
MCHC: 30.7 g/dL (ref 30.0–36.0)
MCV: 92.3 fL (ref 80.0–100.0)
Monocytes Absolute: 0.8 10*3/uL (ref 0.1–1.0)
Monocytes Relative: 5 %
Neutro Abs: 13.3 10*3/uL — ABNORMAL HIGH (ref 1.7–7.7)
Neutrophils Relative %: 76 %
Platelets: 288 10*3/uL (ref 150–400)
RBC: 3.88 MIL/uL (ref 3.87–5.11)
RDW: 14.6 % (ref 11.5–15.5)
WBC: 17.3 10*3/uL — ABNORMAL HIGH (ref 4.0–10.5)
nRBC: 0 % (ref 0.0–0.2)

## 2020-02-08 LAB — PHOSPHORUS: Phosphorus: 2.5 mg/dL (ref 2.5–4.6)

## 2020-02-08 LAB — MAGNESIUM: Magnesium: 2 mg/dL (ref 1.7–2.4)

## 2020-02-08 MED ORDER — ONDANSETRON HCL 4 MG/2ML IJ SOLN
4.0000 mg | Freq: Three times a day (TID) | INTRAMUSCULAR | Status: DC | PRN
  Administered 2020-02-08: 4 mg via INTRAVENOUS
  Filled 2020-02-08: qty 2

## 2020-02-08 NOTE — TOC Progression Note (Signed)
Transition of Care [facility]) - Progression Note    Patient Details  Name: [name] MRN: [id number] Date of Birth: [birth year]  Transition of Care [facility]) CM/SW Contact  Lorri Frederick, LCSW Phone Number: 02/08/2020, 3:28 PM  Clinical Narrative:   CSW spoke to [name] re: Hospice referral and she would like referral made to [facility].  CSW spoke with Wallis Bamberg at [facility] who will initiate contact.     Expected Discharge Plan: Skilled Nursing Facility Barriers to Discharge: Continued Medical Work up  Expected Discharge Plan and Services Expected Discharge Plan: Skilled Nursing Facility       Living arrangements for the past 2 months: Skilled Nursing Facility                                       Social Determinants of Health (SDOH) Interventions    Readmission Risk Interventions No flowsheet data found.

## 2020-02-08 NOTE — Progress Notes (Signed)
Palliative:  HPI: 86 y.o. female  with past medical history of arthritis, hypertension, hypothyroidism, macular degeneration admitted on 02/03/2020 with severe sepsis with renal calculi with Klebsiella UTI and bacteremia with complications from NSTEMI and delirium. She has shown some level of improvement but still with overall failure to thrive.  I met today again with [name]. She is sitting in bed and alert but confused. She does complain of nausea and Zofran ordered for her. I attempted to elicit wishes from her but she has very limited understanding but I did understand that she enjoys being at [facility] and is anxious to return as soon as possible. This is her home and she also seems that she would hope to avoid rehospitalization and treat what she can at her facility. NT reports that she made multiple attempts and encouragement but could not get her to accept any breakfast and only a few sips of coffee. RN reports that she has able to get her to accept Mighty Cup but she was not interested in any of the pureed foods.  I called and spoke with [name]. I explain to [place] that I continue to be concerned that [name] will not eat/drink enough to sustain her very long. She does at times accept nutritional supplements (ice cream cups and Ensure) but not much else. I revisited the option of hospice as I feel that they would be helpful to keep [name] happy and comfortable at [facility]. [name] agrees that she feels that hospice would be benefit to [name] care. 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   All questions/concerns addressed. Emotional support provided. Updated Dr. Alfredia Ferguson and [place].   Exam: Alert, confused. No distress. Breathing regular, unlabored. Abd full but soft. Continues with poor intake.   Plan: - DNR - Return to SNF with hospice in place.  - Complete recommended course of  antibiotics. Treat the treatable with goal of treatment to be provided in SNF and avoid hospitalization.   25 min  Vinie Sill, NP Palliative Medicine Team Pager [phone] (Please see [email or web address] for schedule) Team Phone [phone]    Greater than 50%  of this time was spent counseling and coordinating care related to the above assessment and plan

## 2020-02-08 NOTE — Care Management Important Message (Signed)
Important Message  Patient Details  Name: [name] MRN: [id number] Date of Birth: 01/30/1925   Medicare Important Message Given:  Yes     [name] [facility] [place] 02/08/2020, 2:57 PM

## 2020-02-08 NOTE — Progress Notes (Addendum)
Civil engineer, contracting [facility])  Referral received for hospice services at [facility] once pt discharges.  [name] is her contact and her pastor. Called and left message requesting call back.  [facility] will continue to follow and will plan to begin hospice services once she discharges.    It appears that she is at her baseline, does not appear that she would need additional DME. However will need to determine this with her facility and her NOK.  Wallis Bamberg RN, BSN, CCRN [facility] Liaison    **[place] returned call, confirmed interest in hospice support at [facility].  States she may need a bed with rails and was not sure if she had that in place already or not.  ACC will reach out to [place] to determine if DME is needed.

## 2020-02-08 NOTE — Progress Notes (Signed)
PROGRESS NOTE    [name]  WUJ:[id number] DOB: [birth year] DOA: 02/03/2020 PCP: Marda Stalker, PA-C   Brief Narrative:  [name] is a 86 year old female with history of hypertension, peripheral vascular disease, history of carotid endarterectomy, [place] to the ICU with septic shock, UTI complicated by hydronephrosis and obstructing renal calculus. -She was treated with pressors, antibiotics and IV fluids, was also noted to have elevated troponin >3000. -Seen by urology in consultation, recommended percutaneous nephrostomy tube placement which was completed in interventional radiology on 12/11 -Clinically stabilized, also complicated by delirium -Transferred from PCCM to [facility] service 12/12 -Cardiac work-up noted severe mitral regurgitation and other valvular heart disease -Followed by cardiology and they recommended palliative care medicine and hospice referral was made.  She will continue her IV antibiotics and be transitioned to p.o. tomorrow and likely can be discharged to skilled nursing facility after her IV antibiotics are complete  Assessment & Plan:   Active Problems:   Severe sepsis (HCC)   Malnutrition of moderate degree  Septic shock 2/2 Klebsiella bacteremia/UTI complicated by hydronephrosis, present on Admission -Met Sepsis Criteria as she had Tachypenia (31), Fever of 100.4, Source of Infection as well as a Leukocytosis -Stabilized, off pressors -Underwent percutaneous nephrostomy drain placement 12/11 in IR -WBC is improving and went from 32.2 -> 21.0 -> 16.5 -> 16.4 -> 17.3 -Last LA was 2.3 but will need to Repeat in the AM  -Patient's BUN/Cr is improving daily and went from 49/2.05 -> 30/1.17 -Needs follow-up with Urology in few weeks for definitive management of stone and nephrostomy drain -Blood cultures with Klebsiella resistant to ampicillin otherwise pansensitive, day 5 of ceftriaxone will transition to IV Ancef  for couple more days followed by oral Keflex to complete 10 days of antibiotics -PT OT -Palliative Care consulted for further evaluation  Non-ST elevation MI  Severe MR  Moderate aortic stenosis and moderate TR -Troponin trended up to 3587 -Transthoracic Echocardiogram noted preserved EF, without regional wall motion abnormalities -Had some chest pain earlier which is now resolved -Appreciate cardiology input and Dr. Eleonore Chiquito recommended Palliative care eval -Continue Aspirin 81 mg p.o. daily, Clopidogrel 75 mg p.o. daily, Atenolol 25 mg p.o. daily and Atorvastatin 20 g p.o. daily -Might be appropriate for hospice rule out been made given that she is not a candidate for anything aggressive being bedridden.  Cardiology recommending aggressive goals of care discussion  Moderate Protein Calorie Malnutrition/ Adult Failure to Thrive  -Ongoing weight loss, temporal wasting -Dietitian consulted, supplements added -Nutritionist evaluated and recommended ontinuing dysphagia 1 diet and Ensure Enlive p.o. twice daily, and liquid twice daily with meals, as well as multivitamin with minerals daily -Palliative Care Consulted for [place] Discussion referral has been made for hospice services at discharge when she goes back to her [place], SNF  Hyponatremia -Mild and is improved -Patient's sodium has gone from 146 and now 144 -Continue monitor and trend and repeat CMP in a.m.  Delirium -Stable today -C/w Delirium Precautions  Hypothyroidism -Continue Levothyroxine 75 mcg daily  Bed bound/Debilitated -PT/OT still recommending skilled nursing facility -Resident of SNF plan is for the patient to return to her her SNF with hospice support and continue to have goals of care discussion continuing at the completion of her antibiotic therapy in outpatient setting  Normocytic Anemia -Chronic and Base line Hgb ranging from 9-11 dating back to 2019 -Patient's Hgb/Hct is stable  11.0/35.8 -Check Anemia Panel in the AM -Continue to  Monitor for S/Sx of Bleeding; Currently no overt bleeding noted -Repeat CBC in the AM   Carotid Stenosis s/p Endarterectomy  -C/w aspirin 81 mg p.o. daily as well as atorvastatin 20 mg p.o. daily as well as clopidogrel 75 mg p.o. daily  HLD -Continue with Atorvastatin 20 g p.o. daily  GOC: DNR, poA  DVT prophylaxis: Heparin 5,000 units sq q8h Code Status: DO NOT RESUSCITATE  Family Communication: No family present at bedside  Disposition Plan: Dissipating discharging to SNF in the next 24 to 48 hours once her IV antibiotics have been complete and to p.o. Keflex  Status is: Inpatient  Remains inpatient appropriate because:Unsafe d/c plan, IV treatments appropriate due to intensity of illness or inability to take PO and Inpatient level of care appropriate due to severity of illness   Dispo: The patient is from: Home              Anticipated d/c is to: SNF              Anticipated d/c date is:1-2 days              Patient currently is not medically stable to d/c.  Consultants:   [facility]  IR  Urology  Cardiology    Procedures:  [place] AND FLUORO 10 FR LEFT PCN    Antimicrobials:  Anti-infectives (From admission, onward)   Start     Dose/Rate Route Frequency Ordered Stop   02/09/20 1000  cephALEXin (KEFLEX) capsule 500 mg       "Followed by" Linked Group Details   500 mg Oral Every 8 hours 02/07/20 0914 02/12/20 1359   02/07/20 1000  ceFAZolin (ANCEF) IVPB 1 g/50 mL premix       "Followed by" Linked Group Details   1 g 100 mL/hr over 30 Minutes Intravenous Every 12 hours 02/07/20 0914 02/09/20 0959   02/05/20 2000  vancomycin (VANCOREADY) IVPB 750 mg/150 mL  Status:  Discontinued        750 mg 150 mL/hr over 60 Minutes Intravenous Every 48 hours 02/03/20 2351 02/04/20 0810   02/04/20 1800  ceFEPIme (MAXIPIME) 2 g in sodium chloride 0.9 % 100 mL IVPB  Status:  Discontinued        2 g 200 mL/hr over 30  Minutes Intravenous Every 24 hours 02/03/20 2351 02/04/20 0854   02/04/20 1000  cefTRIAXone (ROCEPHIN) 2 g in sodium chloride 0.9 % 100 mL IVPB  Status:  Discontinued        2 g 200 mL/hr over 30 Minutes Intravenous Every 24 hours 02/04/20 0854 02/07/20 0914   02/04/20 0000  ceFEPIme (MAXIPIME) 1 g in sodium chloride 0.9 % 100 mL IVPB  Status:  Discontinued        1 g 200 mL/hr over 30 Minutes Intravenous Every 12 hours 02/03/20 2347 02/03/20 2351   02/03/20 1930  vancomycin (VANCOREADY) IVPB 1250 mg/250 mL        1,250 mg 166.7 mL/hr over 90 Minutes Intravenous  Once 02/03/20 1839 02/03/20 2118   02/03/20 1845  ceFEPIme (MAXIPIME) 2 g in sodium chloride 0.9 % 100 mL IVPB        2 g 200 mL/hr over 30 Minutes Intravenous  Once 02/03/20 1839 02/03/20 1923   02/03/20 1845  metroNIDAZOLE (FLAGYL) IVPB 500 mg        500 mg 100 mL/hr over 60 Minutes Intravenous  Once 02/03/20 1839 02/03/20 2005        Subjective: Patient was  seen and examined at bedside and she is in no acute distress and denies any complaints but did have some midsternal chest pain earlier which is now resolved.  Denies any nausea or vomiting.  No lightheadedness or dizziness.  No other concerns or complaints at this time and feels okay.  Objective: Vitals:   02/06/20 2100 02/07/20 0300 02/07/20 1503 02/08/20 0507  BP: (!) 141/58 135/60 (!) 134/56 (!) 144/69  Pulse: (!) 102 100 (!) 55 73  Resp: _0 Temp: 98.5 F (36.9 C) 98 F (36.7 C) (!) 97.3 F (36.3 C) 97.7 F (36.5 C)  TempSrc: Oral Oral  Axillary  SpO2: 97% 95% 96% 95%  Weight:      Height:        Intake/Output Summary (Last 24 hours) at 02/08/2020 0825 Last data filed at 02/08/2020 0426 Gross per 24 hour  Intake 520 ml  Output 720 ml  Net -200 ml   Filed Weights   02/03/20 1828 02/04/20 0129  Weight: 59.2 kg 57.6 kg   Examination: Physical Exam:  Constitutional: The patient is a thin chronically ill-appearing Caucasian female currently  in no acute distress appears calm but slightly uncomfortable Eyes: Lids and conjunctivae normal, sclerae anicteric  ENMT: External Ears, Nose appear normal. Grossly normal hearing.  Neck: Appears normal, supple, no cervical masses, normal ROM, no appreciable thyromegaly; no JVD Respiratory: Diminished to auscultation bilaterally, no wheezing, rales, rhonchi or crackles. Normal respiratory effort and patient is not tachypenic. No accessory muscle use.  Unlabored breathing Cardiovascular: RRR, no murmurs / rubs / gallops. S1 and S2 auscultated.  Minimal extremity edema Abdomen: Soft, non-tender, non-distended. Bowel sounds positive.  GU: Deferred. Musculoskeletal: No clubbing / cyanosis of digits/nails. No joint deformity upper and lower extremities.  Skin: No rashes, lesions, ulcers on limited skin evaluation. No induration; Warm and dry.  Neurologic: CN 2-12 grossly intact with no focal deficits. Romberg sign and cerebellar reflexes not assessed.  Psychiatric: Impaired judgment and insight.  She is awake and alert. Normal mood and appropriate affect.   Data Reviewed: I have personally reviewed following labs and imaging studies  CBC: Recent Labs  Lab 02/03/20 1720 02/04/20 0316 02/05/20 0736 02/06/20 0153 02/07/20 0313  WBC 7.3 32.2* 21.0* 16.5* 16.4*  NEUTROABS 6.0  --   --   --   --   HGB 11.8* 10.5* 9.8* 9.6* 10.5*  HCT 36.8 34.6* 30.4* 29.1* 32.6*  MCV 92.5 93.8 91.0 90.1 90.6  PLT 214 203 207 214 417   Basic Metabolic Panel: Recent Labs  Lab 02/03/20 1720 02/04/20 0316 02/05/20 0736 02/06/20 0153 02/07/20 0313  NA 141 138 141 144 146*  K 4.5 4.6 4.0 3.7 3.5  CL 106 105 107 111 112*  CO2 20* 21* 20* 22 22  GLUCOSE 110* 131* 74 115* 117*  BUN 37* 38* 49* 47* 40*  CREATININE 1.59* 1.64* 2.05* 1.69* 1.42*  CALCIUM 8.8* 8.5* 8.3* 8.4* 8.4*  MG  --  1.9  --   --   --   PHOS  --  3.4  --   --   --    GFR: Estimated Creatinine Clearance: 21.4 mL/min (A) (by C-G formula  based on SCr of 1.42 mg/dL (H)). Liver Function Tests: Recent Labs  Lab 02/03/20 1720  AST 24  ALT 17  ALKPHOS 104  BILITOT 0.9  PROT 7.4  ALBUMIN 2.6*   No results for input(s): LIPASE, AMYLASE in the last 168 hours. No results for  input(s): AMMONIA in the last 168 hours. Coagulation Profile: Recent Labs  Lab 02/03/20 1720  INR 1.2   Cardiac Enzymes: No results for input(s): CKTOTAL, CKMB, CKMBINDEX, TROPONINI in the last 168 hours. BNP (last 3 results) No results for input(s): PROBNP in the last 8760 hours. HbA1C: No results for input(s): HGBA1C in the last 72 hours. CBG: No results for input(s): GLUCAP in the last 168 hours. Lipid Profile: No results for input(s): CHOL, HDL, LDLCALC, TRIG, CHOLHDL, LDLDIRECT in the last 72 hours. Thyroid Function Tests: No results for input(s): TSH, T4TOTAL, FREET4, T3FREE, THYROIDAB in the last 72 hours. Anemia Panel: No results for input(s): VITAMINB12, FOLATE, FERRITIN, TIBC, IRON, RETICCTPCT in the last 72 hours. Sepsis Labs: Recent Labs  Lab 02/03/20 1720 02/03/20 2312 02/04/20 0316  LATICACIDVEN 3.8* 1.9 2.3*    Recent Results (from the past 240 hour(s))  Urine culture     Status: Abnormal   Collection Time: 02/03/20  5:20 PM   Specimen: In/Out Cath Urine  Result Value Ref Range Status   Specimen Description IN/OUT CATH URINE  Final   Special Requests   Final    NONE Performed at [facility], [place] [address], [place], [place] [address]    Culture MULTIPLE SPECIES PRESENT, SUGGEST RECOLLECTION (A)  Final   Report Status 02/05/2020 FINAL  Final  Resp Panel by RT-PCR (Flu A&B, Covid) Nasopharyngeal Swab     Status: None   Collection Time: 02/03/20  5:24 PM   Specimen: Nasopharyngeal Swab; Nasopharyngeal(NP) swabs in vial transport medium  Result Value Ref Range Status   SARS Coronavirus 2 by RT PCR NEGATIVE NEGATIVE Final    Comment: (NOTE) SARS-CoV-2 target nucleic acids are NOT DETECTED.  The SARS-CoV-2 RNA  is generally detectable in upper respiratory specimens during the acute phase of infection. The lowest concentration of SARS-CoV-2 viral copies this assay can detect is 138 copies/mL. A negative result does not preclude SARS-Cov-2 infection and should not be used as the sole basis for treatment or other patient management decisions. A negative result may occur with  improper specimen collection/handling, submission of specimen other than nasopharyngeal swab, presence of viral mutation(s) within the areas targeted by this assay, and inadequate number of viral copies(<138 copies/mL). A negative result must be combined with clinical observations, patient history, and epidemiological information. The expected result is Negative.  Fact Sheet for Patients:  EntrepreneurPulse.com.au  Fact Sheet for Healthcare Providers:  IncredibleEmployment.be  This test is no t yet approved or cleared by the [place] FDA and  has been authorized for detection and/or diagnosis of SARS-CoV-2 by FDA under an Emergency Use Authorization (EUA). This EUA will remain  in effect (meaning this test can be used) for the duration of the COVID-19 declaration under Section 564(b)(1) of the Act, 21 U.S.C.section 360bbb-3(b)(1), unless the authorization is terminated  or revoked sooner.       Influenza A by PCR NEGATIVE NEGATIVE Final   Influenza B by PCR NEGATIVE NEGATIVE Final    Comment: (NOTE) The Xpert Xpress SARS-CoV-2/FLU/RSV plus assay is intended as an aid in the diagnosis of influenza from Nasopharyngeal swab specimens and should not be used as a sole basis for treatment. Nasal washings and aspirates are unacceptable for Xpert Xpress SARS-CoV-2/FLU/RSV testing.  Fact Sheet for Patients: EntrepreneurPulse.com.au  Fact Sheet for Healthcare Providers: IncredibleEmployment.be  This test is not yet approved or cleared by the  [place] FDA and has been authorized for detection and/or diagnosis of SARS-CoV-2 by  FDA under an Emergency Use Authorization (EUA). This EUA will remain in effect (meaning this test can be used) for the duration of the COVID-19 declaration under Section 564(b)(1) of the Act, 21 U.S.C. section 360bbb-3(b)(1), unless the authorization is terminated or revoked.  Performed at [facility], [place] [address], [place], [place] [address]   Blood Culture (routine x 2)     Status: Abnormal   Collection Time: 02/03/20  5:36 PM   Specimen: BLOOD RIGHT WRIST  Result Value Ref Range Status   Specimen Description BLOOD RIGHT WRIST  Final   Special Requests   Final    BOTTLES DRAWN AEROBIC AND ANAEROBIC Blood Culture adequate volume   Culture  Setup Time   Final    IN BOTH AEROBIC AND ANAEROBIC BOTTLES GRAM NEGATIVE RODS Organism ID to follow CRITICAL RESULT CALLED TO, READ BACK BY AND VERIFIED WITH: PHARMD Alanda Slim [id number] FCP Performed at [facility], [place] [address], [place],  [address]    Culture KLEBSIELLA PNEUMONIAE (A)  Final   Report Status 02/06/2020 FINAL  Final   Organism ID, Bacteria KLEBSIELLA PNEUMONIAE  Final      Susceptibility   Klebsiella pneumoniae - MIC*    AMPICILLIN >=32 RESISTANT Resistant     CEFAZOLIN <=4 SENSITIVE Sensitive     CEFEPIME <=0.12 SENSITIVE Sensitive     CEFTAZIDIME <=1 SENSITIVE Sensitive     CEFTRIAXONE <=0.25 SENSITIVE Sensitive     CIPROFLOXACIN <=0.25 SENSITIVE Sensitive     GENTAMICIN <=1 SENSITIVE Sensitive     IMIPENEM <=0.25 SENSITIVE Sensitive     TRIMETH/SULFA <=20 SENSITIVE Sensitive     AMPICILLIN/SULBACTAM 4 SENSITIVE Sensitive     PIP/TAZO <=4 SENSITIVE Sensitive     * KLEBSIELLA PNEUMONIAE  Blood Culture ID Panel (Reflexed)     Status: Abnormal   Collection Time: 02/03/20  5:36 PM  Result Value Ref Range Status   Enterococcus faecalis NOT DETECTED NOT DETECTED Final   Enterococcus Faecium NOT DETECTED  NOT DETECTED Final   Listeria monocytogenes NOT DETECTED NOT DETECTED Final   Staphylococcus species NOT DETECTED NOT DETECTED Final   Staphylococcus aureus (BCID) NOT DETECTED NOT DETECTED Final   Staphylococcus epidermidis NOT DETECTED NOT DETECTED Final   Staphylococcus lugdunensis NOT DETECTED NOT DETECTED Final   Streptococcus species NOT DETECTED NOT DETECTED Final   Streptococcus agalactiae NOT DETECTED NOT DETECTED Final   Streptococcus pneumoniae NOT DETECTED NOT DETECTED Final   Streptococcus pyogenes NOT DETECTED NOT DETECTED Final   A.calcoaceticus-baumannii NOT DETECTED NOT DETECTED Final   Bacteroides fragilis NOT DETECTED NOT DETECTED Final   Enterobacterales DETECTED (A) NOT DETECTED Final    Comment: Enterobacterales represent a large order of gram negative bacteria, not a single organism. CRITICAL RESULT CALLED TO, READ BACK BY AND VERIFIED WITH: PHARMD MORGAN HICKS [id number] FCP    Enterobacter cloacae complex NOT DETECTED NOT DETECTED Final   Escherichia coli NOT DETECTED NOT DETECTED Final   Klebsiella aerogenes NOT DETECTED NOT DETECTED Final   Klebsiella oxytoca NOT DETECTED NOT DETECTED Final   Klebsiella pneumoniae DETECTED (A) NOT DETECTED Final    Comment: CRITICAL RESULT CALLED TO, READ BACK BY AND VERIFIED WITH: PHARMD MORGAN HICKS [id number] FCP    Proteus species NOT DETECTED NOT DETECTED Final   Salmonella species NOT DETECTED NOT DETECTED Final   Serratia marcescens NOT DETECTED NOT DETECTED Final   Haemophilus influenzae NOT DETECTED NOT DETECTED Final   Neisseria meningitidis NOT DETECTED NOT  DETECTED Final   Pseudomonas aeruginosa NOT DETECTED NOT DETECTED Final   Stenotrophomonas maltophilia NOT DETECTED NOT DETECTED Final   Candida albicans NOT DETECTED NOT DETECTED Final   Candida auris NOT DETECTED NOT DETECTED Final   Candida glabrata NOT DETECTED NOT DETECTED Final   Candida krusei NOT DETECTED NOT DETECTED Final   Candida parapsilosis  NOT DETECTED NOT DETECTED Final   Candida tropicalis NOT DETECTED NOT DETECTED Final   Cryptococcus neoformans/gattii NOT DETECTED NOT DETECTED Final   CTX-M ESBL NOT DETECTED NOT DETECTED Final   Carbapenem resistance IMP NOT DETECTED NOT DETECTED Final   Carbapenem resistance KPC NOT DETECTED NOT DETECTED Final   Carbapenem resistance NDM NOT DETECTED NOT DETECTED Final   Carbapenem resist OXA 48 LIKE NOT DETECTED NOT DETECTED Final   Carbapenem resistance VIM NOT DETECTED NOT DETECTED Final    Comment: Performed at [facility], [address], [place], [place] [address]  Blood Culture (routine x 2)     Status: Abnormal   Collection Time: 02/03/20  5:49 PM   Specimen: BLOOD RIGHT FOREARM  Result Value Ref Range Status   Specimen Description BLOOD RIGHT FOREARM  Final   Special Requests   Final    BOTTLES DRAWN AEROBIC AND ANAEROBIC Blood Culture adequate volume   Culture  Setup Time   Final    GRAM NEGATIVE RODS IN BOTH AEROBIC AND ANAEROBIC BOTTLES CRITICAL VALUE NOTED.  VALUE IS CONSISTENT WITH PREVIOUSLY REPORTED AND CALLED VALUE.    Culture (A)  Final    KLEBSIELLA PNEUMONIAE SUSCEPTIBILITIES PERFORMED ON PREVIOUS CULTURE WITHIN THE LAST 5 DAYS. Performed at [facility], [place] [address], [place], [place] [address]    Report Status 02/06/2020 FINAL  Final  MRSA PCR Screening     Status: None   Collection Time: 02/04/20  1:45 AM   Specimen: Nasopharyngeal  Result Value Ref Range Status   MRSA by PCR NEGATIVE NEGATIVE Final    Comment:        The GeneXpert MRSA Assay (FDA approved for NASAL specimens only), is one component of a comprehensive MRSA colonization surveillance program. It is not intended to diagnose MRSA infection nor to guide or monitor treatment for MRSA infections. Performed at [facility], [place] [address] [place], [place] [address]      RN Pressure Injury Documentation:     Estimated body mass index is 21.8 kg/m as calculated  from the following:   Height as of this encounter: _0  (1.626 m).   Weight as of this encounter: 57.6 kg.  Malnutrition Type:  Nutrition Problem: Moderate Malnutrition Etiology: chronic illness (PVD s/p carotid endart)  Malnutrition Characteristics:  Signs/Symptoms: moderate fat depletion,severe muscle depletion  Nutrition Interventions:  Interventions: Refer to RD note for recommendations   Radiology Studies: No results found.   Scheduled Meds: . aspirin EC  81 mg Oral Daily  . atenolol  25 mg Oral Daily  . atorvastatin  20 mg Oral Daily  . [START ON 02/09/2020] cephALEXin  500 mg Oral Q8H  . Chlorhexidine Gluconate Cloth  6 each Topical Q0600  . clopidogrel  75 mg Oral Daily  . feeding supplement  237 mL Oral BID BM  . heparin  5,000 Units Subcutaneous Q8H  . levothyroxine  75 mcg Oral QAC breakfast  . mouth rinse  15 mL Mouth Rinse BID  . melatonin  6 mg Oral QHS  . polyvinyl alcohol  1 drop Both Eyes Daily  .  pramipexole  0.125 mg Oral QHS   Continuous Infusions: .  ceFAZolin (ANCEF) IV 1 g (02/07/20 2343)     LOS: 5 days    Kerney Elbe, DO [facility] PAGER is on [place]  If 7PM-7AM, please contact night-coverage [email or web address]

## 2020-02-09 DIAGNOSIS — N390 Urinary tract infection, site not specified: Secondary | ICD-10-CM

## 2020-02-09 DIAGNOSIS — Z515 Encounter for palliative care: Secondary | ICD-10-CM

## 2020-02-09 DIAGNOSIS — A4159 Other Gram-negative sepsis: Principal | ICD-10-CM

## 2020-02-09 LAB — PHOSPHORUS: Phosphorus: 3 mg/dL (ref 2.5–4.6)

## 2020-02-09 LAB — CBC WITH DIFFERENTIAL/PLATELET
Abs Immature Granulocytes: 0.77 10*3/uL — ABNORMAL HIGH (ref 0.00–0.07)
Basophils Absolute: 0.1 10*3/uL (ref 0.0–0.1)
Basophils Relative: 1 %
Eosinophils Absolute: 0.7 10*3/uL — ABNORMAL HIGH (ref 0.0–0.5)
Eosinophils Relative: 4 %
HCT: 33.8 % — ABNORMAL LOW (ref 36.0–46.0)
Hemoglobin: 10.3 g/dL — ABNORMAL LOW (ref 12.0–15.0)
Immature Granulocytes: 5 %
Lymphocytes Relative: 13 %
Lymphs Abs: 2 10*3/uL (ref 0.7–4.0)
MCH: 28.5 pg (ref 26.0–34.0)
MCHC: 30.5 g/dL (ref 30.0–36.0)
MCV: 93.4 fL (ref 80.0–100.0)
Monocytes Absolute: 0.9 10*3/uL (ref 0.1–1.0)
Monocytes Relative: 6 %
Neutro Abs: 10.9 10*3/uL — ABNORMAL HIGH (ref 1.7–7.7)
Neutrophils Relative %: 71 %
Platelets: 291 10*3/uL (ref 150–400)
RBC: 3.62 MIL/uL — ABNORMAL LOW (ref 3.87–5.11)
RDW: 14.7 % (ref 11.5–15.5)
WBC: 15.3 10*3/uL — ABNORMAL HIGH (ref 4.0–10.5)
nRBC: 0 % (ref 0.0–0.2)

## 2020-02-09 LAB — COMPREHENSIVE METABOLIC PANEL
ALT: 10 U/L (ref 0–44)
AST: 24 U/L (ref 15–41)
Albumin: 2.1 g/dL — ABNORMAL LOW (ref 3.5–5.0)
Alkaline Phosphatase: 76 U/L (ref 38–126)
Anion gap: 9 (ref 5–15)
BUN: 28 mg/dL — ABNORMAL HIGH (ref 8–23)
CO2: 23 mmol/L (ref 22–32)
Calcium: 8.2 mg/dL — ABNORMAL LOW (ref 8.9–10.3)
Chloride: 113 mmol/L — ABNORMAL HIGH (ref 98–111)
Creatinine, Ser: 1.1 mg/dL — ABNORMAL HIGH (ref 0.44–1.00)
GFR, Estimated: 47 mL/min — ABNORMAL LOW (ref >60)
Glucose, Bld: 113 mg/dL — ABNORMAL HIGH (ref 70–99)
Potassium: 3.8 mmol/L (ref 3.5–5.1)
Sodium: 145 mmol/L (ref 135–145)
Total Bilirubin: 0.6 mg/dL (ref 0.3–1.2)
Total Protein: 6.3 g/dL — ABNORMAL LOW (ref 6.5–8.1)

## 2020-02-09 LAB — RESP PANEL BY RT-PCR (FLU A&B, COVID) ARPGX2
Influenza A by PCR: NEGATIVE
Influenza B by PCR: NEGATIVE
SARS Coronavirus 2 by RT PCR: NEGATIVE

## 2020-02-09 LAB — LACTIC ACID, PLASMA: Lactic Acid, Venous: 0.9 mmol/L (ref 0.5–1.9)

## 2020-02-09 LAB — MAGNESIUM: Magnesium: 1.9 mg/dL (ref 1.7–2.4)

## 2020-02-09 MED ORDER — POLYETHYLENE GLYCOL 3350 17 G PO PACK: 17.0000 g | PACK | Freq: Every day | ORAL | 0 refills | Status: AC | PRN

## 2020-02-09 MED ORDER — CEPHALEXIN 500 MG PO CAPS: 500.0000 mg | ORAL_CAPSULE | Freq: Three times a day (TID) | ORAL | 0 refills | Status: AC

## 2020-02-09 MED ORDER — CLOPIDOGREL BISULFATE 75 MG PO TABS: 75.0000 mg | ORAL_TABLET | Freq: Every day | ORAL | Status: AC

## 2020-02-09 MED ORDER — TRAMADOL HCL 50 MG PO TABS: 50.0000 mg | ORAL_TABLET | ORAL | 0 refills | Status: AC | PRN

## 2020-02-09 MED ORDER — ENSURE ENLIVE PO LIQD: 237.0000 mL | Freq: Two times a day (BID) | ORAL | 12 refills | Status: AC

## 2020-02-09 NOTE — TOC Transition Note (Signed)
Transition of Care [facility]) - CM/SW Discharge Note   Patient Details  Name: Demari Kropp MRN: [id number] Date of Birth: 05-Jun-1924  Transition of Care [facility]) CM/SW Contact:  Lorri Frederick, LCSW Phone Number: 02/09/2020, 12:29 PM   Clinical Narrative:   Pt discharging to [facility].  [facility] now involved.  RN please call report to [phone].    Final next level of care: Long Term Nursing Home Barriers to Discharge: Barriers Resolved   Patient Goals and CMS Choice        Discharge Placement              Patient chooses bed at:  [facility]) Patient to be transferred to facility by: [facility] Name of family member notified: [name], pastor, left message Patient and family notified of of transfer: 02/09/20  Discharge Plan and Services                                     Social Determinants of Health (SDOH) Interventions     Readmission Risk Interventions No flowsheet data found.

## 2020-02-09 NOTE — Progress Notes (Signed)
Attempted to call report, got answering machine.

## 2020-02-09 NOTE — Progress Notes (Signed)
Civil engineer, contracting [facility]) Hospital Liaison: RN note     Notified by Transition of Care Manger of patient/family request for [facility] services at [facility] after discharge. Chart and patient information reviewed by [facility] physician. Hospice eligibility confirmed.     Please send signed and completed DNR form home with patient/family. Patient will need prescriptions for discharge comfort medications.      Per Randa Lynn with admissions with [facility], the patient does not need any additional DME at this time.   [facility] aware of the above. Please notify [facility] when patient is ready to leave the unit at discharge. (Call [phone] or [phone] after 5pm.)     Please call with any hospice related questions.      Thank you for this referral.      Elsie Saas, RN, [facility] (listed on [facility] under Hospice [facility])   [phone]

## 2020-02-09 NOTE — Progress Notes (Signed)
Physical Therapy Treatment Patient Details Name: [name] MRN: [id number] DOB: [birth year] Today's Date: 02/09/2020    History of Present Illness Pt is 86 yo female admitted from SNF with PMH including HTN, arhtritis, macular degeneration, and PVD.  She was admitted to ICU with septic shock, UTI complicated by hydronephrosis and renal calculus.   Pt s/p nephrostomy tube on 02/04/20. Pt also found to have elvated troponin with NSTEMI - cardiology was consulted. Pt now transferred to med-surg floor.    PT Comments    Pt pleasantly confused throughout session and willing to participate in therapy. Decreased assistance needed for bed mobility this session but significant posterior lean with bed mobility and sitting that eventually subsided as EOB time increased. Showed increased independence with the use of the [facility] for sit to stand transfers. Shows overall deficits in strength, sitting balance, and endurance.   Follow Up Recommendations  SNF     Equipment Recommendations  Other (comment) (defer back to SNF)    Recommendations for Other Services       Precautions / Restrictions      Mobility  Bed Mobility Overal bed mobility: Needs Assistance Bed Mobility: Rolling;Supine to Sit Rolling: Mod assist   Supine to sit: Mod assist;+2 for physical assistance     General bed mobility comments: mod assist +2 for supine to sit with significant posterior lean at trunk throughout; EOB 10 minutes  Transfers Overall transfer level: Needs assistance Equipment used: None Transfers: Sit to/from Stand Sit to Stand: Max assist;+2 physical assistance;Min assist;From elevated surface         General transfer comment: pt max assist +2 to stand with knees blocked, unable to achieve full standing because lack of full ROM at knee extension; pt min assist +2 3x from [facility]  Ambulation/Gait             General Gait Details: unable   Stairs             Wheelchair Mobility     Modified Rankin (Stroke Patients Only)       Balance Overall balance assessment: Needs assistance Sitting-balance support: Bilateral upper extremity supported;Feet supported Sitting balance-Leahy Scale: Poor Sitting balance - Comments: min guard to max assist +2 for sitting EOB, originally max assist because of significant posterior lean and sliding forward despite multimodal cueing, but after continueing to sit there and continued multimodal cueing, became min guard Postural control: Posterior lean   Standing balance-Leahy Scale: Zero Standing balance comment: Required max assist +2 to maintain standing or Stedy to achieve standing                            Cognition Arousal/Alertness: Awake/alert Behavior During Therapy: WFL for tasks assessed/performed Overall Cognitive Status: History of cognitive impairments - at baseline                                 General Comments: A&Ox3 unaware of situation as to why she is here, kept asking when she was going home and how she was getting home, asked for reminders on what the name of her nursing home was      Exercises General Exercises - Lower Extremity Long Arc Quad: AROM;Seated;5 reps;Other (comment) (knee extension ROM limitations) Hip Flexion/Marching: AAROM;15 reps;Seated    General Comments        Pertinent Vitals/Pain Pain Assessment: Faces Faces Pain Scale: Hurts a  little bit Pain Location: Knees with ROM Pain Descriptors / Indicators: Grimacing Pain Intervention(s): Monitored during session    Home Living                      Prior Function            PT Goals (current goals can now be found in the care plan section) Acute Rehab PT Goals Patient Stated Goal: return to [place] Progress towards PT goals: Progressing toward goals    Frequency    Min 2X/week      PT Plan Current plan remains appropriate    Co-evaluation              AM-PAC PT "6  Clicks" Mobility   Outcome Measure  Help needed turning from your back to your side while in a flat bed without using bedrails?: A Lot Help needed moving from lying on your back to sitting on the side of a flat bed without using bedrails?: A Lot Help needed moving to and from a bed to a chair (including a wheelchair)?: Total Help needed standing up from a chair using your arms (e.g., wheelchair or bedside chair)?: Total Help needed to walk in hospital room?: Total Help needed climbing 3-5 steps with a railing? : Total 6 Click Score: 8    End of Session   Activity Tolerance: Patient tolerated treatment well Patient left: in chair;with call bell/phone within reach;with chair alarm set Nurse Communication: Mobility status;Need for lift equipment PT Visit Diagnosis: Unsteadiness on feet (R26.81);Muscle weakness (generalized) (M62.81)     Time: 6213-0865 PT Time Calculation (min) (ACUTE ONLY): 28 min  Charges:  $Therapeutic Exercise: 8-22 mins $Therapeutic Activity: 8-22 mins                     Jeri Cos, SPT [id number]   [name] 02/09/2020, 11:56 AM

## 2020-02-09 NOTE — Discharge Summary (Signed)
Physician Discharge Summary  Marieclaire Bettenhausen YBO:[id number] DOB: May 03, 1924 DOA: 02/03/2020  PCP: Marda Stalker, PA-C  Admit date: 02/03/2020 Discharge date: 02/09/2020  Admitted From: SNF Disposition: SNF with Hospice  Recommendations for Outpatient Follow-up:  1. Follow up with PCP in 1-2 weeks 2. Follow up with Urology as an outpatient to discuss Stone Management  3. Follow up with Cardiology within 1-2 weeks if Necessary  4. Please obtain CMP/CBC, Mag, Phos in one week 5. Please follow up on the following pending results:  Home Health: No Equipment/Devices: Hospital Bed    Discharge Condition: Stable  CODE STATUS: DO NOT RESUSCITATE  Diet recommendation: Dysphagia 1 Diet with Thin Liquids   Brief/Interim Summary: Caitrin Pendergraph is a 86 year old female with history of hypertension, peripheral vascular disease, history of carotid endarterectomy, [place] to [place] ICU with septic shock, UTI complicated by hydronephrosis and obstructing renal calculus. -She was treated with pressors, antibiotics and IV fluids, was also noted to have elevated troponin>3000. -Seen by urology in consultation, recommended percutaneous nephrostomy tube placement which was completed in interventional radiology on 12/11 -Clinically stabilized, also complicated by delirium -Transferred from PCCM to [facility] service 12/12 -Cardiac work-up noted severe mitral regurgitation and other valvular heart disease -Followed by cardiology and they recommended palliative care medicine and hospice referral was made.  She will continue her IV antibiotics and be transitioned to p.o. tomorrow and likely can be discharged to skilled nursing facility after her IV antibiotics are complete  Patient completed her IV Abx and is stable to D/C back to SNF with Hospice. She will need to follow up with PCP, Urology for Definitive Stone management if desired, and Cardiology if necessary.   Discharge  Diagnoses:  Active Problems:   Severe sepsis (HCC)   Malnutrition of moderate degree   Hospice care patient   Palliative care patient  Septic shock 2/2 Klebsiella bacteremia/UTI complicated by hydronephrosis, present on Admission -Met Sepsis Criteria as she had Tachypenia (31), Fever of 100.4, Source of Infection as well as a Leukocytosis -Stabilized, off pressors -Underwent percutaneous nephrostomy drain placement 12/11 in IR -WBC is improving and went from 32.2 -> 21.0 -> 16.5 -> 16.4 -> 17.3 -> 15.3 -Last LA was 2.3 and now improved to 0.9 -Patient's BUN/Cr is improving daily and went from 49/2.05 -> 30/1.17 -> 28/1.10 -Needs follow-up with Urology in few weeks for definitive management of stone and nephrostomy drain -Blood cultures with Klebsiella resistant to ampicillin otherwise pansensitive,day 5 of ceftriaxone will transition to IV Ancef for couple more days followed by oral Keflex to complete 10 days of antibiotics -PT OT -Palliative Care consulted for further evaluation and Hospice Referral Made at D/C when she goes to SNF -She is stable to D/C as her IV Abx are complete  Non-ST elevation [place]  Severe MR Moderate aortic stenosisand moderate TR -Troponin trended up to 3587 -Transthoracic Echocardiogram noted preserved EF, without regional wall motion abnormalities -Had some chest pain earlier which is now resolved -Appreciate cardiology input and Dr. Eleonore Chiquito recommended Palliative care eval -Continue Aspirin 81 mg p.o. daily, Clopidogrel 75 mg p.o. daily, Atenolol 25 mg p.o. daily and Atorvastatin 20 g p.o. daily -Might be appropriate for hospice rule out been made given that she is not a candidate for anything aggressive being bedridden.  Cardiology recommending aggressive goals of care discussion -Follow up with Cardiology as an outpatient if necessary   Moderate Protein Calorie Malnutrition/ Adult Failure to Thrive  -Ongoing weight loss,  temporal  wasting -Dietitian consulted, supplements added -Nutritionist evaluated and recommended ontinuing dysphagia 1 diet and Ensure Enlive p.o. twice daily, and liquid twice daily with meals, as well as multivitamin with minerals daily -Palliative Care Consulted for [place] Discussion referral has been made for hospice services at discharge when she goes back to her [place], SNF  Hyponatremia -Mild and is improved -Patient's sodium has gone from 146 -> 144 -> 145 -Continue monitor and trend and repeat CMP in a.m.  Delirium -Stable today -C/w Delirium Precautions  Hypothyroidism -ContinueLevothyroxine 75 mcg daily  Bed bound/Debilitated -PT/OT still recommending skilled nursing facility -Resident of SNF plan is for the patient to return to her her SNF with hospice support and continue to have goals of care discussion continuing at the completion of her antibiotic therapy in outpatient setting  Normocytic Anemia -Chronic and Base line Hgb ranging from 9-11 dating back to 2019 -Patient's Hgb/Hct is stable 11.0/35.8 yesterday and today was 10.3/33.8 -Check Anemia Panel as an outpatient  If necessary  -Continue to Monitor for S/Sx of Bleeding; Currently no overt bleeding noted -Repeat CBC within 1-2 weeks  Carotid Stenosis s/p Endarterectomy  -C/w aspirin 81 mg p.o. daily as well as atorvastatin 20 mg p.o. daily as well as clopidogrel 75 mg p.o. daily  HLD -Continue with Atorvastatin 20 g p.o. daily  GOC: DNR, poA and will be [facility] at [facility]  Discharge Instructions  Discharge Instructions    Call MD for:  difficulty breathing, headache or visual disturbances   Complete by: As directed    Call MD for:  extreme fatigue   Complete by: As directed    Call MD for:  hives   Complete by: As directed    Call MD for:  persistant dizziness or light-headedness   Complete by: As directed    Call MD for:  persistant nausea and vomiting   Complete by: As directed     Call MD for:  redness, tenderness, or signs of infection (pain, swelling, redness, odor or green/yellow discharge around incision site)   Complete by: As directed    Call MD for:  severe uncontrolled pain   Complete by: As directed    Call MD for:  temperature >100.4   Complete by: As directed    Diet - low sodium heart healthy   Complete by: As directed    Dysphagia 1 Diet with Thin Liquids   Discharge instructions   Complete by: As directed    You were cared for by a hospitalist during your hospital stay. If you have any questions about your discharge medications or the care you received while you were in the hospital after you are discharged, you can call the unit and ask to speak with the hospitalist on call if the hospitalist that took care of you is not available. Once you are discharged, your primary care physician will handle any further medical issues. Please note that NO REFILLS for any discharge medications will be authorized once you are discharged, as it is imperative that you return to your primary care physician (or establish a relationship with a primary care physician if you do not have one) for your aftercare needs so that they can reassess your need for medications and monitor your lab values.  Follow up with PCP, Urology, and Cardiology if necessary. Further Care per Hospice Protocol. Take all medications as prescribed. If symptoms change or worsen please return to the ED for evaluation   Increase activity slowly  Complete by: As directed    No wound care   Complete by: As directed      Allergies as of 02/09/2020   No Known Allergies     Medication List    TAKE these medications   acetaminophen 325 MG tablet Commonly known as: TYLENOL Take 2 tablets (650 mg total) by mouth every 6 (six) hours as needed for mild pain or moderate pain (or Fever >/= 101).   aspirin 81 MG tablet Take 81 mg by mouth daily.   atenolol 25 MG tablet Commonly known as: TENORMIN Take 25  mg by mouth daily.   atorvastatin 20 MG tablet Commonly known as: LIPITOR Take 20 mg by mouth daily.   Biofreeze 4 % Gel Generic drug: Menthol (Topical Analgesic) Apply 1 application topically in the morning and at bedtime.   CALTRATE 600 PLUS-VIT D PO Take 2 tablets by mouth daily.   celecoxib 200 MG capsule Commonly known as: CELEBREX Take 200 mg by mouth 2 (two) times daily.   cephALEXin 500 MG capsule Commonly known as: KEFLEX Take 1 capsule (500 mg total) by mouth every 8 (eight) hours for 3 days.   cetirizine 10 MG tablet Commonly known as: ZYRTEC Take 10 mg by mouth daily.   clopidogrel 75 MG tablet Commonly known as: PLAVIX Take 1 tablet (75 mg total) by mouth daily.   docusate sodium 100 MG capsule Commonly known as: COLACE Take 100 mg by mouth 2 (two) times daily.   feeding supplement Liqd Take 237 mLs by mouth 2 (two) times daily between meals.   levothyroxine 75 MCG tablet Commonly known as: SYNTHROID Take 75 mcg by mouth daily before breakfast.   melatonin 3 MG Tabs tablet Take 6 mg by mouth at bedtime.   polyethylene glycol 17 g packet Commonly known as: MIRALAX / GLYCOLAX Take 17 g by mouth daily as needed for moderate constipation.   pramipexole 0.125 MG tablet Commonly known as: MIRAPEX Take 0.125 mg by mouth at bedtime.   Systane 0.4-0.3 % Gel ophthalmic gel Generic drug: Polyethyl Glycol-Propyl Glycol Place 1 application into both eyes in the morning, at noon, in the evening, and at bedtime.   traMADol 50 MG tablet Commonly known as: ULTRAM Take 1 tablet (50 mg total) by mouth every 4 (four) hours as needed for moderate pain.       Follow-up Information    Marda Stalker, PA-C. Call.   Specialty: Family Medicine Why: Follow up within 1-2 weeks Contact information: [place] [address] [phone]        Robley Fries, MD. Call.   Specialty: Urology Why: Follow up within 1-2 weeks for definitive  [facility] information: [place] [address] [place] [address] [phone]              No Known Allergies  Consultations:  [place]  IR  Urology  Cardiology   Procedures/Studies: CT ABDOMEN PELVIS W CONTRAST  Result Date: 02/03/2020 CLINICAL DATA:  Abdominal pain, fever EXAM: CT ABDOMEN AND PELVIS WITH CONTRAST TECHNIQUE: Multidetector CT imaging of the abdomen and pelvis was performed using the standard protocol following bolus administration of intravenous contrast. CONTRAST:  18m OMNIPAQUE IOHEXOL 300 MG/ML  SOLN COMPARISON:  None. FINDINGS: Lower chest: Small left pleural effusion and trace right pleural effusion. Dependent bibasilar atelectasis. Cardiomegaly. Densely calcified mitral valve, aortic valve, visualized coronary arteries and aorta. Hepatobiliary: No focal hepatic abnormality. Gallbladder unremarkable. Pancreas: No focal abnormality or  ductal dilatation. Spleen: Calcifications in the spleen likely due to old insult. Normal size. Adrenals/Urinary Tract: Large left renal pelvic stone measures up to 1.6 cm. Mild hydronephrosis associated with this stone. Nonobstructing midpole right renal stone. Other nonobstructing stones on the left. No ureteral stones. Adrenal glands and urinary bladder unremarkable. Upper pole cyst on the right measures 4.7 cm. Stomach/Bowel: Sigmoid diverticulosis. No active diverticulitis. Scattered diverticula also noted in the transverse colon and right colon. Moderate stool burden in the rectosigmoid colon. Stomach and small bowel decompressed, unremarkable. Vascular/Lymphatic: Aortoiliac atherosclerosis. No evidence of aneurysm or adenopathy. Reproductive: Calcified fibroids within the uterus. No adnexal mass. Other: No free fluid or free air. Musculoskeletal: No acute bony abnormality. IMPRESSION: Bilateral nephrolithiasis. 16 mm left renal pelvic stone with mild left hydronephrosis. Aortoiliac atherosclerosis.   Coronary artery disease. Colonic diverticulosis.  No active diverticulitis. Moderate stool in the rectosigmoid colon. Small left pleural effusion and trace right pleural effusion. Electronically Signed   By: Rolm Baptise M.D.   On: 02/03/2020 20:05   DG Chest Port 1 View  Result Date: 02/03/2020 CLINICAL DATA:  Questionable sepsis. EXAM: PORTABLE CHEST 1 VIEW COMPARISON:  January 14, 2018 FINDINGS: The lungs are hyperinflated. There is no evidence of acute infiltrate, pleural effusion or pneumothorax. The cardiac silhouette is mildly enlarged and unchanged in size. There is marked severity calcification of the aortic arch. Degenerative changes seen throughout the thoracic spine. IMPRESSION: Stable exam without acute or active cardiopulmonary disease. Electronically Signed   By: Virgina Norfolk M.D.   On: 02/03/2020 18:21   ECHOCARDIOGRAM COMPLETE  Result Date: 02/04/2020    ECHOCARDIOGRAM REPORT   Patient Name:   REENE HARLACHER Date of Exam: 02/04/2020 Medical Rec #:  [id number]  Height:       64.0 in Accession #:    [id number] Weight:       127.0 lb Date of Birth:  [birth year]  BSA:          1.613 m Patient Age:    12 years   BP:           110/49 mmHg Patient Gender: F          HR:           64 bpm. Exam Location:  Inpatient Procedure: 2D Echo, Cardiac Doppler, Color Doppler and Intracardiac            Opacification Agent Indications:    Elevated troponin  History:        Patient has no prior history of Echocardiogram examinations.                 Risk Factors:Hypertension. E. coli UTI.  Sonographer:    Clayton Lefort RDCS (AE) Referring Phys: [place]  1. Left ventricular ejection fraction, by estimation, is 55 to 60%. The left ventricle has normal function. The left ventricle has no regional wall motion abnormalities. Left ventricular diastolic parameters are indeterminate.  2. Right ventricular systolic function is normal. The right ventricular size is normal.  3. Left atrial  size was mildly dilated.  4. The mitral valve is degenerative. Moderate mitral valve regurgitation. No evidence of mitral stenosis. Severe mitral annular calcification.  5. Tricuspid valve regurgitation is moderate.  6. The aortic valve is tricuspid. There is moderate calcification of the aortic valve. There is moderate thickening of the aortic valve. Aortic valve regurgitation is mild. Moderate aortic valve stenosis.  7. The inferior vena cava is normal in size with greater  than 50% respiratory variability, suggesting right atrial pressure of 3 mmHg. FINDINGS  Left Ventricle: Left ventricular ejection fraction, by estimation, is 55 to 60%. The left ventricle has normal function. The left ventricle has no regional wall motion abnormalities. Definity contrast agent was given IV to delineate the left ventricular  endocardial borders. The left ventricular internal cavity size was normal in size. There is no left ventricular hypertrophy. Left ventricular diastolic parameters are indeterminate. Right Ventricle: The right ventricular size is normal. No increase in right ventricular wall thickness. Right ventricular systolic function is normal. Left Atrium: Left atrial size was mildly dilated. Right Atrium: Right atrial size was normal in size. Pericardium: There is no evidence of pericardial effusion. Mitral Valve: The mitral valve is degenerative in appearance. There is severe thickening of the mitral valve leaflet(s). There is severe calcification of the mitral valve leaflet(s). Severe mitral annular calcification. Moderate mitral valve regurgitation. No evidence of mitral valve stenosis. MV peak gradient, 12.2 mmHg. The mean mitral valve gradient is 4.0 mmHg. Tricuspid Valve: The tricuspid valve is normal in structure. Tricuspid valve regurgitation is moderate . No evidence of tricuspid stenosis. Aortic Valve: The aortic valve is tricuspid. There is moderate calcification of the aortic valve. There is moderate  thickening of the aortic valve. There is moderate aortic valve annular calcification. Aortic valve regurgitation is mild. Aortic regurgitation PHT measures 406 msec. Moderate aortic stenosis is present. Aortic valve mean gradient measures 29.0 mmHg. Aortic valve peak gradient measures 49.3 mmHg. Aortic valve area, by VTI measures 0.45 cm. Pulmonic Valve: The pulmonic valve was normal in structure. Pulmonic valve regurgitation is not visualized. No evidence of pulmonic stenosis. Aorta: The aortic root is normal in size and structure. Venous: The inferior vena cava is normal in size with greater than 50% respiratory variability, suggesting right atrial pressure of 3 mmHg. IAS/Shunts: No atrial level shunt detected by color flow Doppler.  LEFT VENTRICLE PLAX 2D LVIDd:         4.50 cm  Diastology LVIDs:         3.10 cm  LV e' medial:    4.12 cm/s LV PW:         1.70 cm  LV E/e' medial:  39.3 LV IVS:        1.10 cm  LV e' lateral:   6.25 cm/s LVOT diam:     1.70 cm  LV E/e' lateral: 25.9 LV SV:         46 LV SV Index:   28 LVOT Area:     2.27 cm  RIGHT VENTRICLE            IVC RV Basal diam:  3.20 cm    IVC diam: 1.90 cm RV S prime:     8.86 cm/s TAPSE (M-mode): 1.9 cm LEFT ATRIUM            Index       RIGHT ATRIUM           Index LA diam:      4.20 cm  2.60 cm/m  RA Area:     15.30 cm LA Vol (A4C): 104.0 ml 64.48 ml/m RA Volume:   38.20 ml  23.68 ml/m  AORTIC VALVE AV Area (Vmax):    0.61 cm AV Area (Vmean):   0.52 cm AV Area (VTI):     0.45 cm AV Vmax:           351.00 cm/s AV Vmean:  247.000 cm/s AV VTI:            1.030 m AV Peak Grad:      49.3 mmHg AV Mean Grad:      29.0 mmHg LVOT Vmax:         94.00 cm/s LVOT Vmean:        57.100 cm/s LVOT VTI:          0.202 m LVOT/AV VTI ratio: 0.20 AI PHT:            406 msec  AORTA Ao Root diam: 3.20 cm Ao Asc diam:  2.80 cm MITRAL VALVE                TRICUSPID VALVE MV Area (PHT): 3.42 cm     TR Peak grad:   41.5 mmHg MV Peak grad:  12.2 mmHg    TR Vmax:         322.00 cm/s MV Mean grad:  4.0 mmHg MV Vmax:       1.75 m/s     SHUNTS MV Vmean:      91.8 cm/s    Systemic VTI:  0.20 m MV Decel Time: 222 msec     Systemic Diam: 1.70 cm MV E velocity: 162.00 cm/s MV A velocity: 131.00 cm/s MV E/A ratio:  1.24 Jenkins Rouge MD Electronically signed by Jenkins Rouge MD Signature Date/Time: 02/04/2020/11:44:46 AM    Final    IR NEPHROSTOMY PLACEMENT LEFT  Result Date: 02/04/2020 INDICATION: 2m left UPJ stone, mild hydronephrosis, concern for urosepsis EXAM: ULTRASOUND AND FLUOROSCOPIC 10 FRENCH LEFT NEPHROSTOMY COMPARISON:  None. MEDICATIONS: 02/03/2020; The antibiotic was administered in an appropriate time frame prior to skin puncture. ANESTHESIA/SEDATION: Fentanyl 25 mcg IV; Versed 1.0 mg IV Moderate Sedation Time:  12 MINUTES The patient was continuously monitored during the procedure by the interventional radiology nurse under my direct supervision. CONTRAST:  10 cc-administered into the collecting system(s) FLUOROSCOPY TIME:  Fluoroscopy Time: 3 minutes 12 seconds (20 mGy). COMPLICATIONS: None immediate. PROCEDURE: 86year old female from a nursing home. No family member available. Several unsuccessful attempts were made to contact the power of attorney prior to the procedure. Therefore, emergent informed written consent was obtained from the patient after a discussion of the procedural risks, benefits and alternatives. All questions were addressed. Maximal Sterile Barrier Technique was utilized including caps, mask, sterile gowns, sterile gloves, sterile drape, hand hygiene and skin antiseptic. A timeout was performed prior to the initiation of the procedure. Previous imaging reviewed. Patient positioned prone. Limited ultrasound performed. The mildly hydronephrotic left kidney was localized and marked for a lower pole access. Under sterile conditions and local anesthesia, ultrasound percutaneous needle access performed of the left kidney lower pole mildly dilated  calyx. Needle position confirmed with ultrasound. There was return of slight blood tinged nonpurulent urine. Contrast injection confirms mild hydronephrosis and a nonobstructing left UPJ stone. Contrast immediately drained past the UPJ stone into the proximal ureter. 018 guidewire inserted followed by the Accustick dilator set. Amplatz guidewire advanced. Tract dilatation performed to insert a 10 French nephrostomy. Retention LOOP was difficult to form within the mildly dilated collecting system. Contrast injection confirms position within the collecting system. Diffuse filling defect throughout the collecting system AND proximal ureter related to hemorrhage from the somewhat difficult tube placement. Catheter secured at the skin site with a Prolene suture and connected to external gravity drainage bag. Sterile dressing applied. No immediate complication. Patient tolerated the procedure well. IMPRESSION: Successful ultrasound and fluoroscopic 10  French left nephrostomy insertion. 16 mm nonobstructing left UPJ calculus. Electronically Signed   By: Jerilynn Mages.  Shick M.D.   On: 02/04/2020 08:35    Subjective: Seen and examined at beside and had no complaints. No CP, SOB, Nausea, or Vomiting. Denies any other complaints and is stable to go back to SNF today.   Discharge Exam: Vitals:   02/08/20 2135 02/09/20 0616  BP: (!) 146/55 (!) 156/63  Pulse: 62 70  Resp: 20 18  Temp: 98.3 F (36.8 C) 97.6 F (36.4 C)  SpO2: 97% 96%   Vitals:   02/08/20 1408 02/08/20 2135 02/09/20 0555 02/09/20 0616  BP: (!) 146/57 (!) 146/55  (!) 156/63  Pulse: 65 62  70  Resp: (!) 22 20  18   Temp: 98.6 F (37 C) 98.3 F (36.8 C)  97.6 F (36.4 C)  TempSrc:  Oral  Oral  SpO2: 96% 97%  96%  Weight:   57.9 kg   Height:       General: Pt is alert, awake, not in acute distress Cardiovascular: RRR, S1/S2 +, no rubs, no gallops Respiratory: Diminished bilaterally, no wheezing, no rhonchi Abdominal: Soft, NT, ND, bowel sounds  + Extremities: no edema, no cyanosis GU: Has a Nephrostomy tube in place  The results of significant diagnostics from this hospitalization (including imaging, microbiology, ancillary and laboratory) are listed below for reference.    Microbiology: Recent Results (from the past 240 hour(s))  Urine culture     Status: Abnormal   Collection Time: 02/03/20  5:20 PM   Specimen: In/Out Cath Urine  Result Value Ref Range Status   Specimen Description IN/OUT CATH URINE  Final   Special Requests   Final    NONE Performed at [facility], [address] [place], [place] [address]    Culture MULTIPLE SPECIES PRESENT, SUGGEST RECOLLECTION (A)  Final   Report Status 02/05/2020 FINAL  Final  Resp Panel by RT-PCR (Flu A&B, Covid) Nasopharyngeal Swab     Status: None   Collection Time: 02/03/20  5:24 PM   Specimen: Nasopharyngeal Swab; Nasopharyngeal(NP) swabs in vial transport medium  Result Value Ref Range Status   SARS Coronavirus 2 by RT PCR NEGATIVE NEGATIVE Final    Comment: (NOTE) SARS-CoV-2 target nucleic acids are NOT DETECTED.  The SARS-CoV-2 RNA is generally detectable in upper respiratory specimens during the acute phase of infection. The lowest concentration of SARS-CoV-2 viral copies this assay can detect is 138 copies/mL. A negative result does not preclude SARS-Cov-2 infection and should not be used as the sole basis for treatment or other patient management decisions. A negative result may occur with  improper specimen collection/handling, submission of specimen other than nasopharyngeal swab, presence of viral mutation(s) within the areas targeted by this assay, and inadequate number of viral copies(<138 copies/mL). A negative result must be combined with clinical observations, patient history, and epidemiological information. The expected result is Negative.  Fact Sheet for Patients:  EntrepreneurPulse.com.au  Fact Sheet for Healthcare Providers:   IncredibleEmployment.be  This test is no t yet approved or cleared by the [place] FDA and  has been authorized for detection and/or diagnosis of SARS-CoV-2 by FDA under an Emergency Use Authorization (EUA). This EUA will remain  in effect (meaning this test can be used) for the duration of the COVID-19 declaration under Section 564(b)(1) of the Act, 21 U.S.C.section 360bbb-3(b)(1), unless the authorization is terminated  or revoked sooner.       Influenza A by PCR  NEGATIVE NEGATIVE Final   Influenza B by PCR NEGATIVE NEGATIVE Final    Comment: (NOTE) The Xpert Xpress SARS-CoV-2/FLU/RSV plus assay is intended as an aid in the diagnosis of influenza from Nasopharyngeal swab specimens and should not be used as a sole basis for treatment. Nasal washings and aspirates are unacceptable for Xpert Xpress SARS-CoV-2/FLU/RSV testing.  Fact Sheet for Patients: EntrepreneurPulse.com.au  Fact Sheet for Healthcare Providers: IncredibleEmployment.be  This test is not yet approved or cleared by the [place] FDA and has been authorized for detection and/or diagnosis of SARS-CoV-2 by FDA under an Emergency Use Authorization (EUA). This EUA will remain in effect (meaning this test can be used) for the duration of the COVID-19 declaration under Section 564(b)(1) of the Act, 21 U.S.C. section 360bbb-3(b)(1), unless the authorization is terminated or revoked.  Performed at [facility], [place] [address] [place], [place] [address]   Blood Culture (routine x 2)     Status: Abnormal   Collection Time: 02/03/20  5:36 PM   Specimen: BLOOD RIGHT WRIST  Result Value Ref Range Status   Specimen Description BLOOD RIGHT WRIST  Final   Special Requests   Final    BOTTLES DRAWN AEROBIC AND ANAEROBIC Blood Culture adequate volume   Culture  Setup Time   Final    IN BOTH AEROBIC AND ANAEROBIC BOTTLES GRAM NEGATIVE RODS Organism ID to  follow CRITICAL RESULT CALLED TO, READ BACK BY AND VERIFIED WITH: PHARMD Alanda Slim [id number] FCP Performed at [facility], [place] [address], [place], [place] [address]    Culture KLEBSIELLA PNEUMONIAE (A)  Final   Report Status 02/06/2020 FINAL  Final   Organism ID, Bacteria KLEBSIELLA PNEUMONIAE  Final      Susceptibility   Klebsiella pneumoniae - MIC*    AMPICILLIN >=32 RESISTANT Resistant     CEFAZOLIN <=4 SENSITIVE Sensitive     CEFEPIME <=0.12 SENSITIVE Sensitive     CEFTAZIDIME <=1 SENSITIVE Sensitive     CEFTRIAXONE <=0.25 SENSITIVE Sensitive     CIPROFLOXACIN <=0.25 SENSITIVE Sensitive     GENTAMICIN <=1 SENSITIVE Sensitive     IMIPENEM <=0.25 SENSITIVE Sensitive     TRIMETH/SULFA <=20 SENSITIVE Sensitive     AMPICILLIN/SULBACTAM 4 SENSITIVE Sensitive     PIP/TAZO <=4 SENSITIVE Sensitive     * KLEBSIELLA PNEUMONIAE  Blood Culture ID Panel (Reflexed)     Status: Abnormal   Collection Time: 02/03/20  5:36 PM  Result Value Ref Range Status   Enterococcus faecalis NOT DETECTED NOT DETECTED Final   Enterococcus Faecium NOT DETECTED NOT DETECTED Final   Listeria monocytogenes NOT DETECTED NOT DETECTED Final   Staphylococcus species NOT DETECTED NOT DETECTED Final   Staphylococcus aureus (BCID) NOT DETECTED NOT DETECTED Final   Staphylococcus epidermidis NOT DETECTED NOT DETECTED Final   Staphylococcus lugdunensis NOT DETECTED NOT DETECTED Final   Streptococcus species NOT DETECTED NOT DETECTED Final   Streptococcus agalactiae NOT DETECTED NOT DETECTED Final   Streptococcus pneumoniae NOT DETECTED NOT DETECTED Final   Streptococcus pyogenes NOT DETECTED NOT DETECTED Final   A.calcoaceticus-baumannii NOT DETECTED NOT DETECTED Final   Bacteroides fragilis NOT DETECTED NOT DETECTED Final   Enterobacterales DETECTED (A) NOT DETECTED Final    Comment: Enterobacterales represent a large order of gram negative bacteria, not a single organism. CRITICAL RESULT CALLED TO, READ  BACK BY AND VERIFIED WITH: PHARMD MORGAN HICKS [id number] 0829 FCP    Enterobacter cloacae complex NOT DETECTED NOT DETECTED Final  Escherichia coli NOT DETECTED NOT DETECTED Final   Klebsiella aerogenes NOT DETECTED NOT DETECTED Final   Klebsiella oxytoca NOT DETECTED NOT DETECTED Final   Klebsiella pneumoniae DETECTED (A) NOT DETECTED Final    Comment: CRITICAL RESULT CALLED TO, READ BACK BY AND VERIFIED WITH: PHARMD MORGAN HICKS [phone] FCP    Proteus species NOT DETECTED NOT DETECTED Final   Salmonella species NOT DETECTED NOT DETECTED Final   Serratia marcescens NOT DETECTED NOT DETECTED Final   Haemophilus influenzae NOT DETECTED NOT DETECTED Final   Neisseria meningitidis NOT DETECTED NOT DETECTED Final   Pseudomonas aeruginosa NOT DETECTED NOT DETECTED Final   Stenotrophomonas maltophilia NOT DETECTED NOT DETECTED Final   Candida albicans NOT DETECTED NOT DETECTED Final   Candida auris NOT DETECTED NOT DETECTED Final   Candida glabrata NOT DETECTED NOT DETECTED Final   Candida krusei NOT DETECTED NOT DETECTED Final   Candida parapsilosis NOT DETECTED NOT DETECTED Final   Candida tropicalis NOT DETECTED NOT DETECTED Final   Cryptococcus neoformans/gattii NOT DETECTED NOT DETECTED Final   CTX-M ESBL NOT DETECTED NOT DETECTED Final   Carbapenem resistance IMP NOT DETECTED NOT DETECTED Final   Carbapenem resistance KPC NOT DETECTED NOT DETECTED Final   Carbapenem resistance NDM NOT DETECTED NOT DETECTED Final   Carbapenem resist OXA 48 LIKE NOT DETECTED NOT DETECTED Final   Carbapenem resistance VIM NOT DETECTED NOT DETECTED Final    Comment: Performed at [facility], [address] [place], [place] [address]  Blood Culture (routine x 2)     Status: Abnormal   Collection Time: 02/03/20  5:49 PM   Specimen: BLOOD RIGHT FOREARM  Result Value Ref Range Status   Specimen Description BLOOD RIGHT FOREARM  Final   Special Requests   Final    BOTTLES DRAWN AEROBIC AND  ANAEROBIC Blood Culture adequate volume   Culture  Setup Time   Final    GRAM NEGATIVE RODS IN BOTH AEROBIC AND ANAEROBIC BOTTLES CRITICAL VALUE NOTED.  VALUE IS CONSISTENT WITH PREVIOUSLY REPORTED AND CALLED VALUE.    Culture (A)  Final    KLEBSIELLA PNEUMONIAE SUSCEPTIBILITIES PERFORMED ON PREVIOUS CULTURE WITHIN THE LAST 5 DAYS. Performed at [facility], [place] [address] [place], [place] [address]    Report Status 02/06/2020 FINAL  Final  MRSA PCR Screening     Status: None   Collection Time: 02/04/20  1:45 AM   Specimen: Nasopharyngeal  Result Value Ref Range Status   MRSA by PCR NEGATIVE NEGATIVE Final    Comment:        The GeneXpert MRSA Assay (FDA approved for NASAL specimens only), is one component of a comprehensive MRSA colonization surveillance program. It is not intended to diagnose MRSA infection nor to guide or monitor treatment for MRSA infections. Performed at [facility], [place] [address], [place], [place] [address]     Labs: BNP (last 3 results) No results for input(s): BNP in the last 8760 hours. Basic Metabolic Panel: Recent Labs  Lab 02/04/20 0316 02/05/20 0736 02/06/20 0153 02/07/20 0313 02/08/20 0910 02/09/20 0051  NA 138 141 144 146* 144 145  K 4.6 4.0 3.7 3.5 3.6 3.8  CL 105 107 111 112* 111 113*  CO2 21* 20* 22 22 23 23   GLUCOSE 131* 74 115* 117* 120* 113*  BUN 38* 49* 47* 40* 30* 28*  CREATININE 1.64* 2.05* 1.69* 1.42* 1.17* 1.10*  CALCIUM 8.5* 8.3* 8.4* 8.4* 8.5* 8.2*  MG 1.9  --   --   --  2.0 1.9  PHOS 3.4  --   --   --  2.5 3.0   Liver Function Tests: Recent Labs  Lab 02/03/20 1720 02/08/20 0910 02/09/20 0051  AST 24 28 24   ALT 17 13 10   ALKPHOS 104 83 76  BILITOT 0.9 0.4 0.6  PROT 7.4 6.8 6.3*  ALBUMIN 2.6* 2.3* 2.1*   No results for input(s): LIPASE, AMYLASE in the last 168 hours. No results for input(s): AMMONIA in the last 168 hours. CBC: Recent Labs  Lab 02/03/20 1720 02/04/20 0316 02/05/20 0736  02/06/20 0153 02/07/20 0313 02/08/20 0910 02/09/20 0051  WBC 7.3   < > 21.0* 16.5* 16.4* 17.3* 15.3*  NEUTROABS 6.0  --   --   --   --  13.3* 10.9*  HGB 11.8*   < > 9.8* 9.6* 10.5* 11.0* 10.3*  HCT 36.8   < > 30.4* 29.1* 32.6* 35.8* 33.8*  MCV 92.5   < > 91.0 90.1 90.6 92.3 93.4  PLT 214   < > 207 214 268 288 291   < > = values in this interval not displayed.   Cardiac Enzymes: No results for input(s): CKTOTAL, CKMB, CKMBINDEX, TROPONINI in the last 168 hours. BNP: Invalid input(s): POCBNP CBG: No results for input(s): GLUCAP in the last 168 hours. D-Dimer No results for input(s): DDIMER in the last 72 hours. Hgb A1c No results for input(s): HGBA1C in the last 72 hours. Lipid Profile No results for input(s): CHOL, HDL, LDLCALC, TRIG, CHOLHDL, LDLDIRECT in the last 72 hours. Thyroid function studies No results for input(s): TSH, T4TOTAL, T3FREE, THYROIDAB in the last 72 hours.  Invalid input(s): FREET3 Anemia work up No results for input(s): VITAMINB12, FOLATE, FERRITIN, TIBC, IRON, RETICCTPCT in the last 72 hours. Urinalysis    Component Value Date/Time   COLORURINE YELLOW 02/03/2020 2038   APPEARANCEUR CLOUDY (A) 02/03/2020 2038   LABSPEC 1.017 02/03/2020 2038   PHURINE 5.0 02/03/2020 2038   GLUCOSEU NEGATIVE 02/03/2020 2038   HGBUR MODERATE (A) 02/03/2020 2038   BILIRUBINUR NEGATIVE 02/03/2020 2038   [place] 02/03/2020 2038   PROTEINUR 100 (A) 02/03/2020 2038   UROBILINOGEN 0.2 04/10/2008 1519   NITRITE POSITIVE (A) 02/03/2020 2038   LEUKOCYTESUR LARGE (A) 02/03/2020 2038   Sepsis Labs Invalid input(s): PROCALCITONIN,  WBC,  LACTICIDVEN Microbiology Recent Results (from the past 240 hour(s))  Urine culture     Status: Abnormal   Collection Time: 02/03/20  5:20 PM   Specimen: In/Out Cath Urine  Result Value Ref Range Status   Specimen Description IN/OUT CATH URINE  Final   Special Requests   Final    NONE Performed at [facility], [address], [place], [place] [address]    Culture MULTIPLE SPECIES PRESENT, SUGGEST RECOLLECTION (A)  Final   Report Status 02/05/2020 FINAL  Final  Resp Panel by RT-PCR (Flu A&B, Covid) Nasopharyngeal Swab     Status: None   Collection Time: 02/03/20  5:24 PM   Specimen: Nasopharyngeal Swab; Nasopharyngeal(NP) swabs in vial transport medium  Result Value Ref Range Status   SARS Coronavirus 2 by RT PCR NEGATIVE NEGATIVE Final    Comment: (NOTE) SARS-CoV-2 target nucleic acids are NOT DETECTED.  The SARS-CoV-2 RNA is generally detectable in upper respiratory specimens during the acute phase of infection. The lowest concentration of SARS-CoV-2 viral copies this assay can detect is 138 copies/mL. A negative result does not preclude SARS-Cov-2 infection and should not be used as the sole basis  for treatment or other patient management decisions. A negative result may occur with  improper specimen collection/handling, submission of specimen other than nasopharyngeal swab, presence of viral mutation(s) within the areas targeted by this assay, and inadequate number of viral copies(<138 copies/mL). A negative result must be combined with clinical observations, patient history, and epidemiological information. The expected result is Negative.  Fact Sheet for Patients:  EntrepreneurPulse.com.au  Fact Sheet for Healthcare Providers:  IncredibleEmployment.be  This test is no t yet approved or cleared by the [place] FDA and  has been authorized for detection and/or diagnosis of SARS-CoV-2 by FDA under an Emergency Use Authorization (EUA). This EUA will remain  in effect (meaning this test can be used) for the duration of the COVID-19 declaration under Section 564(b)(1) of the Act, 21 U.S.C.section 360bbb-3(b)(1), unless the authorization is terminated  or revoked sooner.       Influenza A by PCR NEGATIVE NEGATIVE Final   Influenza B by PCR NEGATIVE  NEGATIVE Final    Comment: (NOTE) The Xpert Xpress SARS-CoV-2/FLU/RSV plus assay is intended as an aid in the diagnosis of influenza from Nasopharyngeal swab specimens and should not be used as a sole basis for treatment. Nasal washings and aspirates are unacceptable for Xpert Xpress SARS-CoV-2/FLU/RSV testing.  Fact Sheet for Patients: EntrepreneurPulse.com.au  Fact Sheet for Healthcare Providers: IncredibleEmployment.be  This test is not yet approved or cleared by the [place] FDA and has been authorized for detection and/or diagnosis of SARS-CoV-2 by FDA under an Emergency Use Authorization (EUA). This EUA will remain in effect (meaning this test can be used) for the duration of the COVID-19 declaration under Section 564(b)(1) of the Act, 21 U.S.C. section 360bbb-3(b)(1), unless the authorization is terminated or revoked.  Performed at [facility], [place] [address] [place],  [address]   Blood Culture (routine x 2)     Status: Abnormal   Collection Time: 02/03/20  5:36 PM   Specimen: BLOOD RIGHT WRIST  Result Value Ref Range Status   Specimen Description BLOOD RIGHT WRIST  Final   Special Requests   Final    BOTTLES DRAWN AEROBIC AND ANAEROBIC Blood Culture adequate volume   Culture  Setup Time   Final    IN BOTH AEROBIC AND ANAEROBIC BOTTLES GRAM NEGATIVE RODS Organism ID to follow CRITICAL RESULT CALLED TO, READ BACK BY AND VERIFIED WITH: PHARMD Alanda Slim [id number] 0840 FCP Performed at [facility], [place] [address], [place], [place] [address]    Culture KLEBSIELLA PNEUMONIAE (A)  Final   Report Status 02/06/2020 FINAL  Final   Organism ID, Bacteria KLEBSIELLA PNEUMONIAE  Final      Susceptibility   Klebsiella pneumoniae - MIC*    AMPICILLIN >=32 RESISTANT Resistant     CEFAZOLIN <=4 SENSITIVE Sensitive     CEFEPIME <=0.12 SENSITIVE Sensitive     CEFTAZIDIME <=1 SENSITIVE Sensitive     CEFTRIAXONE <=0.25  SENSITIVE Sensitive     CIPROFLOXACIN <=0.25 SENSITIVE Sensitive     GENTAMICIN <=1 SENSITIVE Sensitive     IMIPENEM <=0.25 SENSITIVE Sensitive     TRIMETH/SULFA <=20 SENSITIVE Sensitive     AMPICILLIN/SULBACTAM 4 SENSITIVE Sensitive     PIP/TAZO <=4 SENSITIVE Sensitive     * KLEBSIELLA PNEUMONIAE  Blood Culture ID Panel (Reflexed)     Status: Abnormal   Collection Time: 02/03/20  5:36 PM  Result Value Ref Range Status   Enterococcus faecalis NOT DETECTED NOT DETECTED Final   Enterococcus Faecium  NOT DETECTED NOT DETECTED Final   Listeria monocytogenes NOT DETECTED NOT DETECTED Final   Staphylococcus species NOT DETECTED NOT DETECTED Final   Staphylococcus aureus (BCID) NOT DETECTED NOT DETECTED Final   Staphylococcus epidermidis NOT DETECTED NOT DETECTED Final   Staphylococcus lugdunensis NOT DETECTED NOT DETECTED Final   Streptococcus species NOT DETECTED NOT DETECTED Final   Streptococcus agalactiae NOT DETECTED NOT DETECTED Final   Streptococcus pneumoniae NOT DETECTED NOT DETECTED Final   Streptococcus pyogenes NOT DETECTED NOT DETECTED Final   A.calcoaceticus-baumannii NOT DETECTED NOT DETECTED Final   Bacteroides fragilis NOT DETECTED NOT DETECTED Final   Enterobacterales DETECTED (A) NOT DETECTED Final    Comment: Enterobacterales represent a large order of gram negative bacteria, not a single organism. CRITICAL RESULT CALLED TO, READ BACK BY AND VERIFIED WITH: PHARMD MORGAN HICKS [id number] 0829 FCP    Enterobacter cloacae complex NOT DETECTED NOT DETECTED Final   Escherichia coli NOT DETECTED NOT DETECTED Final   Klebsiella aerogenes NOT DETECTED NOT DETECTED Final   Klebsiella oxytoca NOT DETECTED NOT DETECTED Final   Klebsiella pneumoniae DETECTED (A) NOT DETECTED Final    Comment: CRITICAL RESULT CALLED TO, READ BACK BY AND VERIFIED WITH: PHARMD MORGAN HICKS [id number] 0829 FCP    Proteus species NOT DETECTED NOT DETECTED Final   Salmonella species NOT DETECTED NOT DETECTED  Final   Serratia marcescens NOT DETECTED NOT DETECTED Final   Haemophilus influenzae NOT DETECTED NOT DETECTED Final   Neisseria meningitidis NOT DETECTED NOT DETECTED Final   Pseudomonas aeruginosa NOT DETECTED NOT DETECTED Final   Stenotrophomonas maltophilia NOT DETECTED NOT DETECTED Final   Candida albicans NOT DETECTED NOT DETECTED Final   Candida auris NOT DETECTED NOT DETECTED Final   Candida glabrata NOT DETECTED NOT DETECTED Final   Candida krusei NOT DETECTED NOT DETECTED Final   Candida parapsilosis NOT DETECTED NOT DETECTED Final   Candida tropicalis NOT DETECTED NOT DETECTED Final   Cryptococcus neoformans/gattii NOT DETECTED NOT DETECTED Final   CTX-M ESBL NOT DETECTED NOT DETECTED Final   Carbapenem resistance IMP NOT DETECTED NOT DETECTED Final   Carbapenem resistance KPC NOT DETECTED NOT DETECTED Final   Carbapenem resistance NDM NOT DETECTED NOT DETECTED Final   Carbapenem resist OXA 48 LIKE NOT DETECTED NOT DETECTED Final   Carbapenem resistance VIM NOT DETECTED NOT DETECTED Final    Comment: Performed at [facility], [address] [place], [place] [address]  Blood Culture (routine x 2)     Status: Abnormal   Collection Time: 02/03/20  5:49 PM   Specimen: BLOOD RIGHT FOREARM  Result Value Ref Range Status   Specimen Description BLOOD RIGHT FOREARM  Final   Special Requests   Final    BOTTLES DRAWN AEROBIC AND ANAEROBIC Blood Culture adequate volume   Culture  Setup Time   Final    GRAM NEGATIVE RODS IN BOTH AEROBIC AND ANAEROBIC BOTTLES CRITICAL VALUE NOTED.  VALUE IS CONSISTENT WITH PREVIOUSLY REPORTED AND CALLED VALUE.    Culture (A)  Final    KLEBSIELLA PNEUMONIAE SUSCEPTIBILITIES PERFORMED ON PREVIOUS CULTURE WITHIN THE LAST 5 DAYS. Performed at [facility], [place] [address], [place], [place] [address]    Report Status 02/06/2020 FINAL  Final  MRSA PCR Screening     Status: None   Collection Time: 02/04/20  1:45 AM   Specimen:  Nasopharyngeal  Result Value Ref Range Status   MRSA by PCR NEGATIVE NEGATIVE Final    Comment:  The GeneXpert MRSA Assay (FDA approved for NASAL specimens only), is one component of a comprehensive MRSA colonization surveillance program. It is not intended to diagnose MRSA infection nor to guide or monitor treatment for MRSA infections. Performed at [facility], [place] [address], [place], [place] [address]    Time coordinating discharge: 35 minutes  SIGNED:  Kerney Elbe, DO [facility] 02/09/2020, 11:25 AM Pager is on [place]  If 7PM-7AM, please contact night-coverage [email or web address]

## 2020-02-09 NOTE — NC FL2 (Signed)
[place] MEDICAID FL2 LEVEL OF CARE SCREENING TOOL     IDENTIFICATION  Patient Name: [name] Birthdate: April 29, 1924 Sex: female Admission Date (Current Location): 02/03/2020  [facility] and [place] Number:  Producer, television/film/video and Address:  [facility], [address], [place], [place] [address]      Provider Number: [id number]  Attending Physician Name and Address:  Merlene Laughter, DO  Relative Name and Phone Number:  groce,kelly Other [phone]  [phone]    Current Level of Care: Hospital Recommended Level of Care: Skilled Nursing Facility Prior Approval Number:    Date Approved/Denied:   PASRR Number: [id number]  Discharge Plan: SNF    Current Diagnoses: Patient Active Problem List   Diagnosis Date Noted  . Hospice care patient 02/09/2020  . Palliative care patient 02/09/2020  . Malnutrition of moderate degree 02/06/2020  . Severe sepsis (HCC) 02/03/2020  . Stage II pressure ulcer of sacral region (HCC) 01/15/2018  . AKI (acute kidney injury) (HCC) 01/15/2018  . Sepsis (HCC) 01/14/2018  . Mechanical Fall 12/15/2017  . E coli UTI (urinary tract infection) 12/15/2017  . ARF (acute renal failure) (HCC) 12/15/2017  . Hypertension 12/15/2017  . Hypothyroidism 12/15/2017  . Hyperlipidemia 12/15/2017  . Occlusion and stenosis of carotid artery without mention of cerebral infarction 06/09/2011    Orientation RESPIRATION BLADDER Height & Weight     Self  Normal Incontinent Weight: 127 lb 10.3 oz (57.9 kg) Height:  5\' 4"  (162.6 cm)  BEHAVIORAL SYMPTOMS/MOOD NEUROLOGICAL BOWEL NUTRITION STATUS      Incontinent Diet (low sodium, heart healthy.  See discharge summary)  AMBULATORY STATUS COMMUNICATION OF NEEDS Skin   Total Care Verbally Other (Comment) (catheter site)                       Personal Care Assistance Level of Assistance  Bathing,Feeding,Dressing Bathing Assistance: Maximum assistance Feeding assistance:  Maximum assistance Dressing Assistance: Maximum assistance     Functional Limitations Info  Sight,Hearing,Speech Sight Info: Adequate Hearing Info: Adequate Speech Info: Adequate    SPECIAL CARE FACTORS FREQUENCY  PT (By licensed PT),OT (By licensed OT)     PT Frequency: 5x week OT Frequency: 5x week            Contractures Contractures Info: Not present    Additional Factors Info  Code Status,Allergies Code Status Info: DNR Allergies Info: NKA           Current Medications (02/09/2020):  This is the current hospital active medication list Current Facility-Administered Medications  Medication Dose Route Frequency Provider Last Rate Last Admin  . acetaminophen (TYLENOL) tablet 650 mg  650 mg Oral Q6H PRN 02/11/2020, MD   650 mg at 02/07/20 1053  . aspirin EC tablet 81 mg  81 mg Oral Daily Agarwala, 02/09/20, MD   81 mg at 02/09/20 0930  . atenolol (TENORMIN) tablet 25 mg  25 mg Oral Daily Agarwala, Ravi, MD   25 mg at 02/09/20 0930  . atorvastatin (LIPITOR) tablet 20 mg  20 mg Oral Daily Agarwala, 02/11/20, MD   20 mg at 02/09/20 0930  . cephALEXin (KEFLEX) capsule 500 mg  500 mg Oral Q8H Pham, Minh Q, RPH-CPP   500 mg at 02/09/20 0940  . Chlorhexidine Gluconate Cloth 2 % PADS 6 each  6 each Topical Q0600 02/11/20, MD   6 each at 02/09/20 0555  . clopidogrel (PLAVIX) tablet 75 mg  75 mg Oral Daily  Lynnell Catalan, MD   75 mg at 02/09/20 0930  . docusate sodium (COLACE) capsule 100 mg  100 mg Oral BID PRN Lynnell Catalan, MD   100 mg at 02/08/20 1041  . feeding supplement (ENSURE ENLIVE / ENSURE PLUS) liquid 237 mL  237 mL Oral BID BM Zannie Cove, MD   237 mL at 02/09/20 0931  . heparin injection 5,000 Units  5,000 Units Subcutaneous Q8H Lynnell Catalan, MD   5,000 Units at 02/09/20 0555  . levothyroxine (SYNTHROID) tablet 75 mcg  75 mcg Oral QAC breakfast Lynnell Catalan, MD   75 mcg at 02/09/20 0554  . MEDLINE mouth rinse  15 mL Mouth Rinse BID Lynnell Catalan, MD   15  mL at 02/09/20 0931  . melatonin tablet 6 mg  6 mg Oral QHS Lynnell Catalan, MD   6 mg at 02/08/20 2155  . ondansetron (ZOFRAN) injection 4 mg  4 mg Intravenous Q8H PRN Ulice Bold, NP   4 mg at 02/08/20 1344  . polyethylene glycol (MIRALAX / GLYCOLAX) packet 17 g  17 g Oral Daily PRN Agarwala, Daleen Bo, MD      . polyvinyl alcohol (LIQUIFILM TEARS) 1.4 % ophthalmic solution 1 drop  1 drop Both Eyes Daily Kirtland Bouchard, MD   1 drop at 02/09/20 0929  . pramipexole (MIRAPEX) tablet 0.125 mg  0.125 mg Oral QHS Agarwala, Daleen Bo, MD   0.125 mg at 02/08/20 2155  . traMADol (ULTRAM) tablet 50 mg  50 mg Oral Q12H PRN Lynnell Catalan, MD   50 mg at 02/07/20 0540     Discharge Medications: Please see discharge summary for a list of discharge medications.  Relevant Imaging Results:  Relevant Lab Results:   Additional Information SSN: [id number]  UKGURK, Einar [place], [place]

## 2020-02-14 ENCOUNTER — Other Ambulatory Visit: Payer: Self-pay

## 2020-02-14 ENCOUNTER — Inpatient Hospital Stay (HOSPITAL_COMMUNITY)
Admission: EM | Admit: 2020-02-14 | Discharge: 2020-02-23 | DRG: 280 | Disposition: A | Discharge status: Skilled Nursing Facility | Source: Skilled Nursing Facility | Attending: Internal Medicine | Admitting: Internal Medicine

## 2020-02-14 ENCOUNTER — Emergency Department (HOSPITAL_COMMUNITY)

## 2020-02-14 DIAGNOSIS — N136 Pyonephrosis: Secondary | ICD-10-CM | POA: Diagnosis present

## 2020-02-14 DIAGNOSIS — E039 Hypothyroidism, unspecified: Secondary | ICD-10-CM | POA: Diagnosis present

## 2020-02-14 DIAGNOSIS — I083 Combined rheumatic disorders of mitral, aortic and tricuspid valves: Secondary | ICD-10-CM | POA: Diagnosis present

## 2020-02-14 DIAGNOSIS — J9601 Acute respiratory failure with hypoxia: Secondary | ICD-10-CM | POA: Diagnosis present

## 2020-02-14 DIAGNOSIS — E785 Hyperlipidemia, unspecified: Secondary | ICD-10-CM | POA: Diagnosis present

## 2020-02-14 DIAGNOSIS — R0600 Dyspnea, unspecified: Secondary | ICD-10-CM

## 2020-02-14 DIAGNOSIS — I5033 Acute on chronic diastolic (congestive) heart failure: Secondary | ICD-10-CM | POA: Diagnosis present

## 2020-02-14 DIAGNOSIS — A4151 Sepsis due to Escherichia coli [E. coli]: Secondary | ICD-10-CM | POA: Diagnosis not present

## 2020-02-14 DIAGNOSIS — Z1612 Extended spectrum beta lactamase (ESBL) resistance: Secondary | ICD-10-CM | POA: Diagnosis present

## 2020-02-14 DIAGNOSIS — D72829 Elevated white blood cell count, unspecified: Secondary | ICD-10-CM | POA: Diagnosis not present

## 2020-02-14 DIAGNOSIS — Z515 Encounter for palliative care: Secondary | ICD-10-CM

## 2020-02-14 DIAGNOSIS — E440 Moderate protein-calorie malnutrition: Secondary | ICD-10-CM | POA: Diagnosis present

## 2020-02-14 DIAGNOSIS — I739 Peripheral vascular disease, unspecified: Secondary | ICD-10-CM | POA: Diagnosis present

## 2020-02-14 DIAGNOSIS — B9620 Unspecified Escherichia coli [E. coli] as the cause of diseases classified elsewhere: Secondary | ICD-10-CM | POA: Diagnosis present

## 2020-02-14 DIAGNOSIS — D649 Anemia, unspecified: Secondary | ICD-10-CM | POA: Diagnosis present

## 2020-02-14 DIAGNOSIS — Z79899 Other long term (current) drug therapy: Secondary | ICD-10-CM

## 2020-02-14 DIAGNOSIS — Z7982 Long term (current) use of aspirin: Secondary | ICD-10-CM

## 2020-02-14 DIAGNOSIS — I509 Heart failure, unspecified: Secondary | ICD-10-CM

## 2020-02-14 DIAGNOSIS — Z823 Family history of stroke: Secondary | ICD-10-CM

## 2020-02-14 DIAGNOSIS — J810 Acute pulmonary edema: Secondary | ICD-10-CM | POA: Diagnosis present

## 2020-02-14 DIAGNOSIS — Z936 Other artificial openings of urinary tract status: Secondary | ICD-10-CM

## 2020-02-14 DIAGNOSIS — N183 Chronic kidney disease, stage 3 unspecified: Secondary | ICD-10-CM | POA: Diagnosis present

## 2020-02-14 DIAGNOSIS — R6510 Systemic inflammatory response syndrome (SIRS) of non-infectious origin without acute organ dysfunction: Secondary | ICD-10-CM | POA: Diagnosis not present

## 2020-02-14 DIAGNOSIS — Z8249 Family history of ischemic heart disease and other diseases of the circulatory system: Secondary | ICD-10-CM

## 2020-02-14 DIAGNOSIS — N1832 Chronic kidney disease, stage 3b: Secondary | ICD-10-CM | POA: Diagnosis present

## 2020-02-14 DIAGNOSIS — Z7401 Bed confinement status: Secondary | ICD-10-CM

## 2020-02-14 DIAGNOSIS — R7881 Bacteremia: Secondary | ICD-10-CM | POA: Diagnosis present

## 2020-02-14 DIAGNOSIS — Z20822 Contact with and (suspected) exposure to covid-19: Secondary | ICD-10-CM | POA: Diagnosis present

## 2020-02-14 DIAGNOSIS — Z79890 Hormone replacement therapy (postmenopausal): Secondary | ICD-10-CM

## 2020-02-14 DIAGNOSIS — I130 Hypertensive heart and chronic kidney disease with heart failure and stage 1 through stage 4 chronic kidney disease, or unspecified chronic kidney disease: Secondary | ICD-10-CM | POA: Diagnosis present

## 2020-02-14 DIAGNOSIS — R54 Age-related physical debility: Secondary | ICD-10-CM | POA: Diagnosis present

## 2020-02-14 DIAGNOSIS — I214 Non-ST elevation (NSTEMI) myocardial infarction: Secondary | ICD-10-CM | POA: Diagnosis present

## 2020-02-14 DIAGNOSIS — Z66 Do not resuscitate: Secondary | ICD-10-CM | POA: Diagnosis present

## 2020-02-14 DIAGNOSIS — Z7902 Long term (current) use of antithrombotics/antiplatelets: Secondary | ICD-10-CM

## 2020-02-14 LAB — CBC WITH DIFFERENTIAL/PLATELET
Abs Immature Granulocytes: 0.29 10*3/uL — ABNORMAL HIGH (ref 0.00–0.07)
Basophils Absolute: 0.1 10*3/uL (ref 0.0–0.1)
Basophils Relative: 1 %
Eosinophils Absolute: 0.3 10*3/uL (ref 0.0–0.5)
Eosinophils Relative: 2 %
HCT: 37.4 % (ref 36.0–46.0)
Hemoglobin: 11.5 g/dL — ABNORMAL LOW (ref 12.0–15.0)
Immature Granulocytes: 2 %
Lymphocytes Relative: 12 %
Lymphs Abs: 1.8 10*3/uL (ref 0.7–4.0)
MCH: 29.1 pg (ref 26.0–34.0)
MCHC: 30.7 g/dL (ref 30.0–36.0)
MCV: 94.7 fL (ref 80.0–100.0)
Monocytes Absolute: 0.8 10*3/uL (ref 0.1–1.0)
Monocytes Relative: 5 %
Neutro Abs: 12.1 10*3/uL — ABNORMAL HIGH (ref 1.7–7.7)
Neutrophils Relative %: 78 %
Platelets: 311 10*3/uL (ref 150–400)
RBC: 3.95 MIL/uL (ref 3.87–5.11)
RDW: 15.6 % — ABNORMAL HIGH (ref 11.5–15.5)
WBC: 15.4 10*3/uL — ABNORMAL HIGH (ref 4.0–10.5)
nRBC: 0 % (ref 0.0–0.2)

## 2020-02-14 LAB — BRAIN NATRIURETIC PEPTIDE: B Natriuretic Peptide: 1172.7 pg/mL — ABNORMAL HIGH (ref 0.0–100.0)

## 2020-02-14 LAB — BASIC METABOLIC PANEL
Anion gap: 11 (ref 5–15)
BUN: 27 mg/dL — ABNORMAL HIGH (ref 8–23)
CO2: 25 mmol/L (ref 22–32)
Calcium: 9.1 mg/dL (ref 8.9–10.3)
Chloride: 105 mmol/L (ref 98–111)
Creatinine, Ser: 1.23 mg/dL — ABNORMAL HIGH (ref 0.44–1.00)
GFR, Estimated: 41 mL/min — ABNORMAL LOW (ref >60)
Glucose, Bld: 113 mg/dL — ABNORMAL HIGH (ref 70–99)
Potassium: 3.8 mmol/L (ref 3.5–5.1)
Sodium: 141 mmol/L (ref 135–145)

## 2020-02-14 LAB — TSH: TSH: 4.454 u[IU]/mL (ref 0.350–4.500)

## 2020-02-14 LAB — PREALBUMIN: Prealbumin: 13.5 mg/dL — ABNORMAL LOW (ref 18–38)

## 2020-02-14 LAB — RESP PANEL BY RT-PCR (FLU A&B, COVID) ARPGX2
Influenza A by PCR: NEGATIVE
Influenza B by PCR: NEGATIVE
SARS Coronavirus 2 by RT PCR: NEGATIVE

## 2020-02-14 MED ORDER — ATENOLOL 25 MG PO TABS
25.0000 mg | ORAL_TABLET | Freq: Every day | ORAL | Status: DC
  Administered 2020-02-14 – 2020-02-23 (×10): 25 mg via ORAL
  Filled 2020-02-14 (×10): qty 1

## 2020-02-14 MED ORDER — ACETAMINOPHEN 325 MG PO TABS: 650.0000 mg | ORAL_TABLET | ORAL | Status: DC | PRN

## 2020-02-14 MED ORDER — MUSCLE RUB 10-15 % EX CREA
1.0000 "application " | TOPICAL_CREAM | Freq: Two times a day (BID) | CUTANEOUS | Status: DC
  Administered 2020-02-14 – 2020-02-22 (×17): 1 via TOPICAL
  Filled 2020-02-14: qty 85

## 2020-02-14 MED ORDER — DOCUSATE SODIUM 100 MG PO CAPS
100.0000 mg | ORAL_CAPSULE | Freq: Two times a day (BID) | ORAL | Status: DC
  Administered 2020-02-14 – 2020-02-23 (×19): 100 mg via ORAL
  Filled 2020-02-14 (×19): qty 1

## 2020-02-14 MED ORDER — SODIUM CHLORIDE 0.9 % IV SOLN: 250.0000 mL | INTRAVENOUS | Status: DC | PRN

## 2020-02-14 MED ORDER — ENOXAPARIN SODIUM 30 MG/0.3ML ~~LOC~~ SOLN
30.0000 mg | SUBCUTANEOUS | Status: DC
  Administered 2020-02-14 – 2020-02-23 (×10): 30 mg via SUBCUTANEOUS
  Filled 2020-02-14 (×10): qty 0.3

## 2020-02-14 MED ORDER — SODIUM CHLORIDE 0.9% FLUSH
3.0000 mL | Freq: Two times a day (BID) | INTRAVENOUS | Status: DC
  Administered 2020-02-14 – 2020-02-23 (×15): 3 mL via INTRAVENOUS

## 2020-02-14 MED ORDER — ENSURE ENLIVE PO LIQD
237.0000 mL | Freq: Two times a day (BID) | ORAL | Status: DC
  Administered 2020-02-15 (×2): 237 mL via ORAL
  Filled 2020-02-14: qty 237

## 2020-02-14 MED ORDER — MELATONIN 3 MG PO TABS
6.0000 mg | ORAL_TABLET | Freq: Every day | ORAL | Status: DC
  Administered 2020-02-14 – 2020-02-22 (×9): 6 mg via ORAL
  Filled 2020-02-14 (×9): qty 2

## 2020-02-14 MED ORDER — SODIUM CHLORIDE 0.9% FLUSH
3.0000 mL | INTRAVENOUS | Status: DC | PRN
Start: 2020-02-14 — End: 2020-02-23

## 2020-02-14 MED ORDER — FUROSEMIDE 10 MG/ML IJ SOLN
40.0000 mg | Freq: Once | INTRAMUSCULAR | Status: DC
  Filled 2020-02-14: qty 4

## 2020-02-14 MED ORDER — POLYETHYLENE GLYCOL 3350 17 G PO PACK: 17.0000 g | PACK | Freq: Every day | ORAL | Status: DC | PRN

## 2020-02-14 MED ORDER — LORATADINE 10 MG PO TABS
10.0000 mg | ORAL_TABLET | Freq: Every day | ORAL | Status: DC
  Administered 2020-02-14 – 2020-02-23 (×10): 10 mg via ORAL
  Filled 2020-02-14 (×10): qty 1

## 2020-02-14 MED ORDER — POLYVINYL ALCOHOL 1.4 % OP SOLN
1.0000 [drp] | Freq: Three times a day (TID) | OPHTHALMIC | Status: DC
  Administered 2020-02-17 – 2020-02-23 (×18): 1 [drp] via OPHTHALMIC
  Filled 2020-02-14 (×2): qty 15

## 2020-02-14 MED ORDER — ATORVASTATIN CALCIUM 10 MG PO TABS
20.0000 mg | ORAL_TABLET | Freq: Every day | ORAL | Status: DC
  Administered 2020-02-14 – 2020-02-22 (×9): 20 mg via ORAL
  Filled 2020-02-14 (×9): qty 2

## 2020-02-14 MED ORDER — ASPIRIN 81 MG PO CHEW
81.0000 mg | CHEWABLE_TABLET | Freq: Every day | ORAL | Status: DC
  Administered 2020-02-14 – 2020-02-23 (×10): 81 mg via ORAL
  Filled 2020-02-14 (×10): qty 1

## 2020-02-14 MED ORDER — BISACODYL 10 MG RE SUPP: 10.0000 mg | Freq: Every day | RECTAL | Status: DC | PRN

## 2020-02-14 MED ORDER — PRAMIPEXOLE DIHYDROCHLORIDE 0.125 MG PO TABS
0.1250 mg | ORAL_TABLET | Freq: Every day | ORAL | Status: DC
  Administered 2020-02-14 – 2020-02-22 (×9): 0.125 mg via ORAL
  Filled 2020-02-14 (×10): qty 1

## 2020-02-14 MED ORDER — CLOPIDOGREL BISULFATE 75 MG PO TABS
75.0000 mg | ORAL_TABLET | Freq: Every day | ORAL | Status: DC
  Administered 2020-02-14 – 2020-02-23 (×10): 75 mg via ORAL
  Filled 2020-02-14 (×10): qty 1

## 2020-02-14 MED ORDER — FUROSEMIDE 10 MG/ML IJ SOLN
20.0000 mg | Freq: Two times a day (BID) | INTRAMUSCULAR | Status: DC
  Administered 2020-02-14 – 2020-02-16 (×4): 20 mg via INTRAVENOUS
  Filled 2020-02-14 (×4): qty 2

## 2020-02-14 MED ORDER — TRAMADOL HCL 50 MG PO TABS
50.0000 mg | ORAL_TABLET | Freq: Two times a day (BID) | ORAL | Status: DC | PRN
  Administered 2020-02-16 – 2020-02-19 (×3): 50 mg via ORAL
  Filled 2020-02-14 (×3): qty 1

## 2020-02-14 MED ORDER — ONDANSETRON HCL 4 MG/2ML IJ SOLN
4.0000 mg | Freq: Four times a day (QID) | INTRAMUSCULAR | Status: DC | PRN
  Administered 2020-02-16: 4 mg via INTRAVENOUS
  Filled 2020-02-14: qty 2

## 2020-02-14 MED ORDER — FUROSEMIDE 20 MG PO TABS
40.0000 mg | ORAL_TABLET | Freq: Once | ORAL | Status: AC
  Administered 2020-02-14: 40 mg via ORAL
  Filled 2020-02-14: qty 2

## 2020-02-14 MED ORDER — NITROGLYCERIN 0.4 MG SL SUBL: 0.4000 mg | SUBLINGUAL_TABLET | SUBLINGUAL | Status: DC | PRN

## 2020-02-14 NOTE — Progress Notes (Signed)
Palliative Medicine RN Note: Consult order noted.   Ms Hebard is actively admitted to [facility] for hospice care. She was sent to the ED without hospice being notified. I called the [facility] liaison to notify them of her admission; they have a relationship with the family & will discuss GOC. This will likely be a hospice GIP admission, but the [facility] liaison will leave a note clarifying whether or not it is for sure.   At this time, [facility] does not feel our team is needed. The pt remains under their care, and they will see her. [facility] will call us if needed. PMT will not engage at this time.  Margret Chance Azhia Siefken, RN, BSN, [facility] Palliative Medicine Team 02/14/2020 8:55 AM Office [phone]

## 2020-02-14 NOTE — ED Notes (Signed)
Pt transported. Paperwork from the facility including DNR form are with the patient.  Business card of Hospice Liaison went with the patient.

## 2020-02-14 NOTE — ED Notes (Signed)
Lunch Tray Ordered @ 1117. 

## 2020-02-14 NOTE — ED Triage Notes (Signed)
Pt bib gems from [place] c/o of SOB that started early this morning. EMS reports initial o2 sat 84% on RA. Pt at 100% on 4L.   BP: 154/72 HR: 84 CBG: 157

## 2020-02-14 NOTE — H&P (Addendum)
History and Physical    [name] NAT:[id number] DOB: May 20, 1924 DOA: 02/14/2020  Referring MD/NP/PA: Zadie Rhine, MD PCP: Jarrett Soho, PA-C  Patient coming from: [facility] via EMS  Chief Complaint: Trouble breathing  I have personally briefly reviewed patient's old medical records in [facility]   HPI: [name] is a 86 y.o. female with medical history significant of hypertension, peripheral vascular disease, carotid artery stenosis s/p endarterectomy, and bedbound presents with complaints of trouble breathing.  History is limited from the patient and mostly obtained from review of records.  Patient states that she had trouble breathing overnight.  She reports associated symptoms of intermittent left-sided chest pain, but states that this is not new and unchanged.  Denies having any fever, leg swelling, nausea, vomiting, or diarrhea for what she can remember.  Recently admitted from 12/10-12/16 to the ICU with septic shock secondary requirement temporary pressor support for UTI complicated by hydronephrosis and obstructing renal calculus.  Blood cultures were positive for Klebsiella resistant to ampicillin, but otherwise pansensitive.  Treated with IV antibiotics and percutaneous nephrostomy tube placement.  Patient was also seen to have troponin elevated up to 3587. Echocardiogram revealed EF 55 - 60% with severe mitral regurgitation, moderate aortic stenosis, and moderate tricuspid regurgitation.  Cardiology was consulted, but patient was not a candidate for any aggressive procedures given that she was bedridden.  She is a DNR and was set up with hospice at discharge to skilled nursing facility.    En route with EMS patient's O2 saturations were reportedly 84% on room air and improved to 100% on 4 L of nasal cannula oxygen.  ED Course: Upon admission into the emergency department patient was seen to be afebrile with respirations 11-30, O2 saturations currently maintained on  2 L of nasal cannula oxygen, and all other vital signs maintained.  Labs significant for WBC 15.4, hemoglobin 11.5, BUN 27, creatinine 1.23, and BNP 1172.7.  Chest x-ray significant for cardiomegaly with pulmonary edema and small bilateral pleural effusions.  IV access was difficult to obtain and patient was given furosemide 40 mg p.o.   Review of Systems  Unable to perform ROS: Dementia  Respiratory: Positive for shortness of breath.   Cardiovascular: Positive for chest pain.    Past Medical History:  Diagnosis Date  . ARF (acute renal failure) (HCC) 11/2017  . Arthritis   . Carotid artery occlusion    Bruit found August 2009  . Hypertension   . Hypothyroidism    Hypothyroidism  . Macular degeneration of left eye   . Multiple falls 12/16/2017  . UTI (urinary tract infection) 12/16/2017    Past Surgical History:  Procedure Laterality Date  . APPENDECTOMY    . CAROTID ENDARTERECTOMY  04/14/2008   Right  . CATARACT EXTRACTION    . IR NEPHROSTOMY PLACEMENT LEFT  02/04/2020  . LUMBAR LAMINECTOMY       reports that she has never smoked. She has never used smokeless tobacco. She reports that she does not drink alcohol and does not use drugs.  No Known Allergies  Family History  Problem Relation Age of Onset  . Heart disease Mother        After age 97  . Stroke Mother   . Heart attack Mother   . Hypertension Mother   . Other Mother        Amputation of Leg- Because of a fall in Nursing Home.    Prior to Admission medications   Medication Sig Start Date  End Date Taking? Authorizing Provider  acetaminophen (TYLENOL) 325 MG tablet Take 2 tablets (650 mg total) by mouth every 6 (six) hours as needed for mild pain or moderate pain (or Fever >/= 101). 01/20/18   Hongalgi, Maximino Greenland, MD  aspirin 81 MG tablet Take 81 mg by mouth daily.    [provider]  atenolol (TENORMIN) 25 MG tablet Take 25 mg by mouth daily.    [provider]  atorvastatin (LIPITOR) 20 MG  tablet Take 20 mg by mouth daily.    [provider]  Calcium-Vitamin D (CALTRATE 600 PLUS-VIT D PO) Take 2 tablets by mouth daily.     [provider]  celecoxib (CELEBREX) 200 MG capsule Take 200 mg by mouth 2 (two) times daily.    [provider]  cetirizine (ZYRTEC) 10 MG tablet Take 10 mg by mouth daily.    [provider]  clopidogrel (PLAVIX) 75 MG tablet Take 1 tablet (75 mg total) by mouth daily. 02/09/20   Marguerita Merles Latif, DO  docusate sodium (COLACE) 100 MG capsule Take 100 mg by mouth 2 (two) times daily.    [provider]  feeding supplement (ENSURE ENLIVE / ENSURE PLUS) LIQD Take 237 mLs by mouth 2 (two) times daily between meals. 02/09/20   Marguerita Merles Latif, DO  levothyroxine (SYNTHROID, LEVOTHROID) 75 MCG tablet Take 75 mcg by mouth daily before breakfast.     [provider]  melatonin 3 MG TABS tablet Take 6 mg by mouth at bedtime.    [provider]  Menthol, Topical Analgesic, (BIOFREEZE) 4 % GEL Apply 1 application topically in the morning and at bedtime.    [provider]  Polyethyl Glycol-Propyl Glycol (SYSTANE) 0.4-0.3 % GEL ophthalmic gel Place 1 application into both eyes in the morning, at noon, in the evening, and at bedtime.    [provider]  polyethylene glycol (MIRALAX / GLYCOLAX) 17 g packet Take 17 g by mouth daily as needed for moderate constipation. 02/09/20   Marguerita Merles Latif, DO  pramipexole (MIRAPEX) 0.125 MG tablet Take 0.125 mg by mouth at bedtime.    [provider]  traMADol (ULTRAM) 50 MG tablet Take 1 tablet (50 mg total) by mouth every 4 (four) hours as needed for moderate pain. 02/09/20   Merlene Laughter, DO    Physical Exam:  Constitutional: Elderly female who appears to have some increased work of breathing Vitals:   02/14/20 0530 02/14/20 0600 02/14/20 0630 02/14/20 0645  BP: (!) 125/53 (!) 125/59  124/63  Pulse: 67 71 68 67  Resp: 20 (!)  Temp:      TempSrc:      SpO2: 100% 100% 100% 100%  Weight:      Height:       Eyes: PERRL, lids and conjunctivae normal ENMT: Mucous membranes are dry. Posterior pharynx clear of any exudate or lesions. Hard of hearing.  Poor dentition. Neck: normal, supple, no masses, no thyromegaly.  JVD appreciated Respiratory: Mildly tachypneic with increased crackles noted in both lung fields.  Patient currently on 2 L nasal cannula oxygen with O2 saturation maintained Cardiovascular: Regular rate and rhythm, positive systolic ejection murmur appreciated.  +1 pitting bilateral lower extremity edema. 2+ pedal pulses. No carotid bruits.  Abdomen: no tenderness, no masses palpated. No hepatosplenomegaly. Bowel sounds positive.  Nephrostomy tube in place draining yellow urine. Musculoskeletal: no clubbing / cyanosis.  Skin: no rashes, lesions, ulcers. No induration Neurologic:  CN 2-12 grossly intact.  Able to move extremities Psychiatric: Poor memory.  Alert and oriented to self and place.    Labs on Admission: I have personally reviewed following labs and imaging studies  CBC: Recent Labs  Lab 02/08/20 0910 02/09/20 0051 02/14/20 0422  WBC 17.3* 15.3* 15.4*  NEUTROABS 13.3* 10.9* 12.1*  HGB 11.0* 10.3* 11.5*  HCT 35.8* 33.8* 37.4  MCV 92.3 93.4 94.7  PLT 288 291 311   Basic Metabolic Panel: Recent Labs  Lab 02/08/20 0910 02/09/20 0051 02/14/20 0422  NA 144 145 141  K 3.6 3.8 3.8  CL 111 113* 105  CO2 23 23 25   GLUCOSE 120* 113* 113*  BUN 30* 28* 27*  CREATININE 1.17* 1.10* 1.23*  CALCIUM 8.5* 8.2* 9.1  MG 2.0 1.9  --   PHOS 2.5 3.0  --    GFR: Estimated Creatinine Clearance: 24.7 mL/min (A) (by C-G formula based on SCr of 1.23 mg/dL (H)). Liver Function Tests: Recent Labs  Lab 02/08/20 0910 02/09/20 0051  AST 28 24  ALT 13 10  ALKPHOS 83 76  BILITOT 0.4 0.6  PROT 6.8 6.3*  ALBUMIN 2.3* 2.1*   No results for input(s): LIPASE, AMYLASE in the last 168  hours. No results for input(s): AMMONIA in the last 168 hours. Coagulation Profile: No results for input(s): INR, PROTIME in the last 168 hours. Cardiac Enzymes: No results for input(s): CKTOTAL, CKMB, CKMBINDEX, TROPONINI in the last 168 hours. BNP (last 3 results) No results for input(s): PROBNP in the last 8760 hours. HbA1C: No results for input(s): HGBA1C in the last 72 hours. CBG: No results for input(s): GLUCAP in the last 168 hours. Lipid Profile: No results for input(s): CHOL, HDL, LDLCALC, TRIG, CHOLHDL, LDLDIRECT in the last 72 hours. Thyroid Function Tests: No results for input(s): TSH, T4TOTAL, FREET4, T3FREE, THYROIDAB in the last 72 hours. Anemia Panel: No results for input(s): VITAMINB12, FOLATE, FERRITIN, TIBC, IRON, RETICCTPCT in the last 72 hours. Urine analysis:    Component Value Date/Time   COLORURINE YELLOW 02/03/2020 2038   APPEARANCEUR CLOUDY (A) 02/03/2020 2038   LABSPEC 1.017 02/03/2020 2038   PHURINE 5.0 02/03/2020 2038   GLUCOSEU NEGATIVE 02/03/2020 2038   HGBUR MODERATE (A) 02/03/2020 2038   BILIRUBINUR NEGATIVE 02/03/2020 2038   KETONESUR NEGATIVE 02/03/2020 2038   PROTEINUR 100 (A) 02/03/2020 2038   UROBILINOGEN 0.2 04/10/2008 1519   NITRITE POSITIVE (A) 02/03/2020 2038   LEUKOCYTESUR LARGE (A) 02/03/2020 2038   Sepsis Labs: Recent Results (from the past 240 hour(s))  Resp Panel by RT-PCR (Flu A&B, Covid) Nasopharyngeal Swab     Status: None   Collection Time: 02/09/20  9:11 AM   Specimen: Nasopharyngeal Swab; Nasopharyngeal(NP) swabs in vial transport medium  Result Value Ref Range Status   SARS Coronavirus 2 by RT PCR NEGATIVE NEGATIVE Final    Comment: (NOTE) SARS-CoV-2 target nucleic acids are NOT DETECTED.  The SARS-CoV-2 RNA is generally detectable in upper respiratory specimens during the acute phase of infection. The lowest concentration of SARS-CoV-2 viral copies this assay can detect is 138 copies/mL. A negative result does not  preclude SARS-Cov-2 infection and should not be used as the sole basis for treatment or other patient management decisions. A negative result may occur with  improper specimen collection/handling, submission of specimen other than nasopharyngeal swab, presence of viral mutation(s) within the areas targeted by this assay, and inadequate number of viral copies(<138 copies/mL). A negative result must be combined with clinical observations, patient  history, and epidemiological information. The expected result is Negative.  Fact Sheet for Patients:  BloggerCourse.com  Fact Sheet for Healthcare Providers:  SeriousBroker.it  This test is no t yet approved or cleared by the [place] FDA and  has been authorized for detection and/or diagnosis of SARS-CoV-2 by FDA under an Emergency Use Authorization (EUA). This EUA will remain  in effect (meaning this test can be used) for the duration of the COVID-19 declaration under Section 564(b)(1) of the Act, 21 U.S.C.section 360bbb-3(b)(1), unless the authorization is terminated  or revoked sooner.       Influenza A by PCR NEGATIVE NEGATIVE Final   Influenza B by PCR NEGATIVE NEGATIVE Final    Comment: (NOTE) The Xpert Xpress SARS-CoV-2/FLU/RSV plus assay is intended as an aid in the diagnosis of influenza from Nasopharyngeal swab specimens and should not be used as a sole basis for treatment. Nasal washings and aspirates are unacceptable for Xpert Xpress SARS-CoV-2/FLU/RSV testing.  Fact Sheet for Patients: BloggerCourse.com  Fact Sheet for Healthcare Providers: SeriousBroker.it  This test is not yet approved or cleared by the [place] FDA and has been authorized for detection and/or diagnosis of SARS-CoV-2 by FDA under an Emergency Use Authorization (EUA). This EUA will remain in effect (meaning this test can be used) for the  duration of the COVID-19 declaration under Section 564(b)(1) of the Act, 21 U.S.C. section 360bbb-3(b)(1), unless the authorization is terminated or revoked.  Performed at [facility], [address], [place], [place] [address]   Resp Panel by RT-PCR (Flu A&B, Covid) Nasopharyngeal Swab     Status: None   Collection Time: 02/14/20  4:22 AM   Specimen: Nasopharyngeal Swab; Nasopharyngeal(NP) swabs in vial transport medium  Result Value Ref Range Status   SARS Coronavirus 2 by RT PCR NEGATIVE NEGATIVE Final    Comment: (NOTE) SARS-CoV-2 target nucleic acids are NOT DETECTED.  The SARS-CoV-2 RNA is generally detectable in upper respiratory specimens during the acute phase of infection. The lowest concentration of SARS-CoV-2 viral copies this assay can detect is 138 copies/mL. A negative result does not preclude SARS-Cov-2 infection and should not be used as the sole basis for treatment or other patient management decisions. A negative result may occur with  improper specimen collection/handling, submission of specimen other than nasopharyngeal swab, presence of viral mutation(s) within the areas targeted by this assay, and inadequate number of viral copies(<138 copies/mL). A negative result must be combined with clinical observations, patient history, and epidemiological information. The expected result is Negative.  Fact Sheet for Patients:  BloggerCourse.com  Fact Sheet for Healthcare Providers:  SeriousBroker.it  This test is no t yet approved or cleared by the [place] FDA and  has been authorized for detection and/or diagnosis of SARS-CoV-2 by FDA under an Emergency Use Authorization (EUA). This EUA will remain  in effect (meaning this test can be used) for the duration of the COVID-19 declaration under Section 564(b)(1) of the Act, 21 U.S.C.section 360bbb-3(b)(1), unless the authorization is terminated  or  revoked sooner.       Influenza A by PCR NEGATIVE NEGATIVE Final   Influenza B by PCR NEGATIVE NEGATIVE Final    Comment: (NOTE) The Xpert Xpress SARS-CoV-2/FLU/RSV plus assay is intended as an aid in the diagnosis of influenza from Nasopharyngeal swab specimens and should not be used as a sole basis for treatment. Nasal washings and aspirates are unacceptable for Xpert Xpress SARS-CoV-2/FLU/RSV testing.  Fact Sheet for Patients: BloggerCourse.com  Fact  Sheet for Healthcare Providers: SeriousBroker.ithttps://www.fda.gov/media/152162/download  This test is not yet approved or cleared by the [place] FDA and has been authorized for detection and/or diagnosis of SARS-CoV-2 by FDA under an Emergency Use Authorization (EUA). This EUA will remain in effect (meaning this test can be used) for the duration of the COVID-19 declaration under Section 564(b)(1) of the Act, 21 U.S.C. section 360bbb-3(b)(1), unless the authorization is terminated or revoked.  Performed at [facility], [address] [place], [place] [address]      Radiological Exams on Admission: DG Chest Port 1 View  Result Date: 02/14/2020 CLINICAL DATA:  Shortness of breath EXAM: PORTABLE CHEST 1 VIEW COMPARISON:  Radiograph 02/03/2020 FINDINGS: Prior imaging demonstrates a background of some coarsened interstitial and bronchitic features though overall, interstitial opacities have increased from prior with more hazy attenuation in the perihilar region and lower lungs. Vascular redistribution is noted as well. Small bilateral effusions. No pneumothorax. The cardiac silhouette is likely enlarged with dense calcification of mitral annulus. Aortic atherosclerotic calcification is noted as well. Remaining cardiomediastinal contours are unremarkable. No acute osseous or soft tissue abnormality. Degenerative changes are present in the imaged spine and shoulders. High-riding appearance of the bilateral humeral  heads likely reflecting underlying rotator cuff insufficiency. Telemetry leads overlie the chest. IMPRESSION: Findings may reflect CHF/volume overload with cardiomegaly, interstitial and alveolar edema and small bilateral effusions. Background of chronic coarsened interstitial and bronchitic features. Electronically Signed   By: Kreg ShropshirePrice  DeHay M.D.   On: 02/14/2020 04:16    EKG: Independently reviewed. Sinus rhythm 83   Assessment/Plan Respiratory failure with hypoxia secondary to exacerbation of diastolic congestive heart failure: Acute.  Patient presented with complaints of shortness of breath.  O2 saturations down to 86% on room air with improvement on 2 L nasal cannula oxygen currently.  Chest x-ray showing pulmonary edema with bilateral effusion.  BNP elevated at 1172.7.  Echocardiogram from 12/11 revealed EF of 55-60% with severe mitral regurgitation, moderate aortic stenosis, and mild tricuspid regurgitation. -Admit to a telemetry bed -Heart failure orders set  initiated  -Continuous pulse oximetry with nasal cannula oxygen as needed to keep O2 saturations >92% -Strict I&Os and daily weights -Elevate lower extremities -Lasix 20 mg IV Bid -Reassess in a.m. and adjust diuresis as needed  Severe mitral regurgitation/moderate aortic stenosis/moderate tricuspid regurgitation: As seen per previous echocardiogram, but aggressive intervention was not recommended.  Recent Klebsiella bacteremia/UTI complicated by hydronephrosis: Patient status post nephrostomy drain.  Cultures were resistant to ampicillin, but otherwise pansensitive.  Patient received 5 days of Rocephin, and was transitioned to IV Ancef to be followed by Keflex to complete 10 days of antibiotics to be completed on 12/19. -Still needs outpatient follow-up with urology to discuss stone management  Leukocytosis/SIRS: WBC 15.4 which appears similar to when patient was discharged on 12/16.  She was also tachypneic meeting SIRS criteria,  but likely secondary to fluid overload status.  She is otherwise afebrile -Check blood cultures  Chronic kidney disease stage IIIb: Creatinine mildly elevated up to 1.23 with BUN 27 on admission.  Baseline creatinine had been around 1.1 at discharge on 12/16.  Suspect hypoperfusion related with fluid overload. -Continue to monitor kidney function with diuresis  Protein calorie malnutrition/dysphagia: Patient has been seen by speech therapy during last hospitalization and recommended a dysphagia 1 diet. -Add-on prealbumin -Assistance with meals -Dysphagia 1 diet per previous recommendations -Continue feeding supplements previously prescribed  Essential hypertension: Home medications include atenolol 25 mg daily -Continue beta-blocker  as tolerated   Normocytic anemia: Stable hemoglobin 11.5 g/dL which appears near baseline that has ranged from 9 to 11 g/dL on average -Continue to monitor  Hypothyroidism -Add-on TSH -Continue levothyroxine  Carotid artery stenosis s/p endarterectomy -Continue aspirin, Plavix, and statin  Hyperlipidemia: Home medications include atorvastatin 20 mg p.o. daily. -Continue statin  Debility: Patient bedbound at baseline.   DNR and hospice: Prior to arrival. -Palliative care consult   DVT prophylaxis: lovenox  Code Status: DNR Family Communication: Lewie Chamber) updated about patient's condition Disposition Plan: Discharged likely back to [place] once medically stable Consults called: none  Admission status: Inpatient, require more than 2 midnight stay for IV diuresis  Clydie Braun MD [facility]   If 7PM-7AM, please contact night-coverage   02/14/2020, 7:17 AM

## 2020-02-14 NOTE — ED Provider Notes (Signed)
[facility] EMERGENCY DEPARTMENT Provider Note   CSN: [id number] Arrival date & time: 02/14/20  0344     History Chief Complaint - shortness of breath  Level 5 caveat due to acuity of condition [name] is a 86 y.o. female.  The history is provided by the patient, the EMS personnel and the nursing home.  Shortness of Breath Severity:  Severe Onset quality:  Sudden Timing:  Constant Progression:  Worsening Chronicity:  New Relieved by:  Nothing Worsened by:  Nothing     Patient presents from nursing facility for shortness of breath.  Patient had reported she could not breathe and was noted to be hypoxic to 83%.  EMS was called and they also noted hypoxia.  Patient was placed on oxygen and sent to the ER for evaluation.  Patient was recently admitted and had a percutaneous nephrostomy tube placed per records Past Medical History:  Diagnosis Date  . ARF (acute renal failure) (HCC) 11/2017  . Arthritis   . Carotid artery occlusion    Bruit found August 2009  . Hypertension   . Hypothyroidism    Hypothyroidism  . Macular degeneration of left eye   . Multiple falls 12/16/2017  . UTI (urinary tract infection) 12/16/2017    Patient Active Problem List   Diagnosis Date Noted  . Hospice care patient 02/09/2020  . Palliative care patient 02/09/2020  . Malnutrition of moderate degree 02/06/2020  . Severe sepsis (HCC) 02/03/2020  . Stage II pressure ulcer of sacral region (HCC) 01/15/2018  . AKI (acute kidney injury) (HCC) 01/15/2018  . Sepsis (HCC) 01/14/2018  . Mechanical Fall 12/15/2017  . E coli UTI (urinary tract infection) 12/15/2017  . ARF (acute renal failure) (HCC) 12/15/2017  . Hypertension 12/15/2017  . Hypothyroidism 12/15/2017  . Hyperlipidemia 12/15/2017  . Occlusion and stenosis of carotid artery without mention of cerebral infarction 06/09/2011    Past Surgical History:  Procedure Laterality Date  . APPENDECTOMY    . CAROTID  ENDARTERECTOMY  04/14/2008   Right  . CATARACT EXTRACTION    . IR NEPHROSTOMY PLACEMENT LEFT  02/04/2020  . LUMBAR LAMINECTOMY       OB History   No obstetric history on file.     Family History  Problem Relation Age of Onset  . Heart disease Mother        After age 38  . Stroke Mother   . Heart attack Mother   . Hypertension Mother   . Other Mother        Amputation of Leg- Because of a fall in Nursing Home.    Social History   Tobacco Use  . Smoking status: Never Smoker  . Smokeless tobacco: Never Used  Vaping Use  . Vaping Use: Never used  Substance Use Topics  . Alcohol use: No  . Drug use: No    Home Medications Prior to Admission medications   Medication Sig Start Date End Date Taking? Authorizing Provider  acetaminophen (TYLENOL) 325 MG tablet Take 2 tablets (650 mg total) by mouth every 6 (six) hours as needed for mild pain or moderate pain (or Fever >/= 101). 01/20/18   Hongalgi, Maximino Greenland, MD  aspirin 81 MG tablet Take 81 mg by mouth daily.    [provider]  atenolol (TENORMIN) 25 MG tablet Take 25 mg by mouth daily.    [provider]  atorvastatin (LIPITOR) 20 MG tablet Take 20 mg by mouth daily.    [provider]  Calcium-Vitamin D (CALTRATE 600 PLUS-VIT D PO) Take 2 tablets by mouth daily.     [provider]  celecoxib (CELEBREX) 200 MG capsule Take 200 mg by mouth 2 (two) times daily.    [provider]  cetirizine (ZYRTEC) 10 MG tablet Take 10 mg by mouth daily.    [provider]  clopidogrel (PLAVIX) 75 MG tablet Take 1 tablet (75 mg total) by mouth daily. 02/09/20   Marguerita MerlesSheikh, Omair Latif, DO  docusate sodium (COLACE) 100 MG capsule Take 100 mg by mouth 2 (two) times daily.    [provider]  feeding supplement (ENSURE ENLIVE / ENSURE PLUS) LIQD Take 237 mLs by mouth 2 (two) times daily between meals. 02/09/20   Marguerita MerlesSheikh, Omair Latif, DO  levothyroxine (SYNTHROID, LEVOTHROID) 75 MCG tablet  Take 75 mcg by mouth daily before breakfast.     [provider]  melatonin 3 MG TABS tablet Take 6 mg by mouth at bedtime.    [provider]  Menthol, Topical Analgesic, (BIOFREEZE) 4 % GEL Apply 1 application topically in the morning and at bedtime.    [provider]  Polyethyl Glycol-Propyl Glycol (SYSTANE) 0.4-0.3 % GEL ophthalmic gel Place 1 application into both eyes in the morning, at noon, in the evening, and at bedtime.    [provider]  polyethylene glycol (MIRALAX / GLYCOLAX) 17 g packet Take 17 g by mouth daily as needed for moderate constipation. 02/09/20   Marguerita MerlesSheikh, Omair Latif, DO  pramipexole (MIRAPEX) 0.125 MG tablet Take 0.125 mg by mouth at bedtime.    [provider]  traMADol (ULTRAM) 50 MG tablet Take 1 tablet (50 mg total) by mouth every 4 (four) hours as needed for moderate pain. 02/09/20   Merlene LaughterSheikh, Omair Latif, DO    Allergies    Patient has no known allergies.  Review of Systems   Review of Systems  Unable to perform ROS: Acuity of condition  Respiratory: Positive for shortness of breath.     Physical Exam Updated Vital Signs BP 135/78   Pulse 83   Temp 98 F (36.7 C) (Oral)   Resp (!) 24   Ht 1.626 m (5\' 4" )   Wt 57.9 kg   SpO2 100%   BMI 21.91 kg/m   Physical Exam CONSTITUTIONAL: Elderly/frail/distress noted HEAD: Normocephalic/atraumatic EYES: EOMI/PERRL ENMT: Poor dentition NECK: supple no meningeal signs SPINE/BACK:entire spine nontender CV: S1/S2 noted, heart sounds limited due to respiratory distress LUNGS: Crackles bilaterally, tachypnea ABDOMEN: soft, nontender, no rebound or guarding, bowel sounds noted throughout abdomen GU: Bandaging noted over left flank NEURO: Pt is awake/alert/appropriate, moves all extremitiesx4.  No facial droop.   EXTREMITIES: pulses normal/equal, full ROM SKIN: Cool to touch PSYCH: Unable to assess  ED Results / Procedures / Treatments   Labs (all labs ordered  are listed, but only abnormal results are displayed) Labs Reviewed  BASIC METABOLIC PANEL - Abnormal; Notable for the following components:      Result Value   Glucose, Bld 113 (*)    BUN 27 (*)    Creatinine, Ser 1.23 (*)    GFR, Estimated 41 (*)    All other components within normal limits  BRAIN NATRIURETIC PEPTIDE - Abnormal; Notable for the following components:   B Natriuretic Peptide 1,172.7 (*)    All other components within normal limits  CBC WITH DIFFERENTIAL/PLATELET - Abnormal; Notable for the following components:   WBC 15.4 (*)    Hemoglobin 11.5 (*)    RDW  15.6 (*)    Neutro Abs 12.1 (*)    Abs Immature Granulocytes 0.29 (*)    All other components within normal limits  RESP PANEL BY RT-PCR (FLU A&B, COVID) [id number]    EKG EKG Interpretation  Date/Time:  Tuesday February 14 2020 03:54:05 EST Ventricular Rate:  83 PR Interval:    QRS Duration: 141 QT Interval:  426 QTC Calculation: 501 R Axis:   41 Text Interpretation: Sinus rhythm Prolonged PR interval Left atrial enlargement Right bundle branch block Baseline wander in lead(s) II aVR V2 V6 Confirmed by Zadie Rhine ([id number]) on 02/14/2020 3:58:08 AM   Radiology DG Chest Port 1 View  Result Date: 02/14/2020 CLINICAL DATA:  Shortness of breath EXAM: PORTABLE CHEST 1 VIEW COMPARISON:  Radiograph 02/03/2020 FINDINGS: Prior imaging demonstrates a background of some coarsened interstitial and bronchitic features though overall, interstitial opacities have increased from prior with more hazy attenuation in the perihilar region and lower lungs. Vascular redistribution is noted as well. Small bilateral effusions. No pneumothorax. The cardiac silhouette is likely enlarged with dense calcification of mitral annulus. Aortic atherosclerotic calcification is noted as well. Remaining cardiomediastinal contours are unremarkable. No acute osseous or soft tissue abnormality. Degenerative changes are present in the imaged spine  and shoulders. High-riding appearance of the bilateral humeral heads likely reflecting underlying rotator cuff insufficiency. Telemetry leads overlie the chest. IMPRESSION: Findings may reflect CHF/volume overload with cardiomegaly, interstitial and alveolar edema and small bilateral effusions. Background of chronic coarsened interstitial and bronchitic features. Electronically Signed   By: Kreg Shropshire M.D.   On: 02/14/2020 04:16    Procedures .Critical Care Performed by: Zadie Rhine, MD Authorized by: Zadie Rhine, MD   Critical care provider statement:    Critical care time (minutes):  35   Critical care start time:  02/14/2020 4:00 AM   Critical care end time:  02/14/2020 4:35 AM   Critical care time was exclusive of:  Separately billable procedures and treating other patients   Critical care was necessary to treat or prevent imminent or life-threatening deterioration of the following conditions:  Respiratory failure and cardiac failure   Critical care was time spent personally by me on the following activities:  Development of treatment plan with patient or surrogate, discussions with consultants, evaluation of patient's response to treatment, examination of patient, review of old charts, re-evaluation of patient's condition, pulse oximetry, ordering and review of laboratory studies, ordering and review of radiographic studies and ordering and performing treatments and interventions   I assumed direction of critical care for this patient from another provider in my specialty: no     Care discussed with: admitting provider      Medications Ordered in ED Medications  furosemide (LASIX) injection 40 mg (40 mg Intravenous Not Given 02/14/20 0624)  furosemide (LASIX) tablet 40 mg (40 mg Oral Given 02/14/20 0529)    ED Course  I have reviewed the triage vital signs and the nursing notes.  Pertinent labs & imaging results that were available during my care of the patient were  reviewed by me and considered in my medical decision making (see chart for details).    MDM Rules/Calculators/A&P                         4:01 AM Patient presents from [place], [place] They report they evaluated patient and she was short of breath and hypoxic with a low heart rate.  They called 911.  She does  not typically become short of breath Patient is a DNR, and recently placed on hospice.  They have contacted the next contact and left a message via phone  Review of chart reveals patient had a recent case of urosepsis and underwent percutaneous nephrostomy tube. We will follow closely 7:12 AM Patient found to have pulmonary edema.  Patient given Lasix, she appears more comfortable. Discussed with Dr. Eugenie Filler for admission   This patient presents to the ED for concern of shortness of breath, this involves an extensive number of treatment options, and is a complaint that carries with it a high risk of complications and morbidity.  The differential diagnosis includes CHF, acute MI, pulmonary embolism, pneumonia, COVID-19   Lab Tests:   I Ordered, reviewed, and interpreted labs, which included electrolytes, complete blood count, BNP  Medicines ordered:   I ordered medication lasix  For pulmonary edema   Imaging Studies ordered:   I ordered imaging studies which included chest x-ray   I independently visualized and interpreted imaging which showed pulmonary edema  Additional history obtained:   Additional history obtained from nursing facility  Previous records obtained and reviewed   Consultations Obtained:   I consulted triad hospitalist dr Katrinka Blazing  and discussed lab and imaging findings  Reevaluation:  After the interventions stated above, I reevaluated the patient and found pt is improved  Critical Interventions:  . Diuresis, oxygenation  Final Clinical Impression(s) / ED Diagnoses Final diagnoses:  Acute respiratory failure with hypoxia (HCC)   Acute pulmonary edema [facility])    Rx / DC Orders ED Discharge Orders    None       Zadie Rhine, MD 02/14/20 [phone]

## 2020-02-14 NOTE — Progress Notes (Signed)
ReDS Clip Diuretic Study Pt study # [id number]  Your patient is in the Blinded arm of the ReDS Clip Diuretic study.  Your patient has had a ReDS reading and the reading has been transmitted to the cloud.   Thank You   The research team   Cheyane Ayon Boothby, PharmD, BCPS Heart Failure Stewardship Pharmacist Phone [phone]  Please check [email or web address] for unit-specific pharmacist phone numbers  

## 2020-02-14 NOTE — Progress Notes (Addendum)
[facility]) Hospitalized hospice patient visit.  [name] is new hospice patient admitted to [name] yesterday with a hospice diagnosis of hypertensive heart disease. Patient became SOB, hypoxic, sats 83% with bradycardia, [name] activated EMS. Patient was transported to [name] ED. Patient was admitted to [name] on 02/14/2020 with a diagnosis of Acute respiratory failure with hypoxia and Acute pulmonary edema.  Per Dr. Kirt Boys with [name] this is a related hospital admission.   Visited patientt at bedside. She is alert and oriented x3 and in NAD. She is on O2 1Lnc with a sat of 100%. She denies pain. No visitors present. Received report from bedside RN.   VS: 98.0, 125/57, 64, 15, 100% on 1Lnc I/O: 0/200  Abnormal labs: 02/14/2020 04:22 Glucose: 113 (H) BUN: 27 (H) Creatinine: 1.23 (H) GFR, Estimated: 41 (L) B Natriuretic Peptide: 1,172.7 (H) WBC: 15.4 (H) Hemoglobin: 11.5 (L) RDW: 15.6 (H) NEUT#: 12.1 (H) Abs Immature Granulocytes: 0.29 (H) Glucose: 113 (H)  Epic Imaging: CXR  IMPRESSION: Findings may reflect CHF/volume overload with cardiomegaly, interstitial and alveolar edema and small bilateral effusions. Background of chronic coarsened interstitial and bronchitic features  IV/PRN meds: none at this time.   Problem list: Assessment/Plan Respiratory failure with hypoxia secondary to exacerbation of diastolic congestive heart failure: Acute.  Severe mitral regurgitation/moderate aortic stenosis/moderate tricuspid regurgitation Recent Klebsiella bacteremia/UTI complicated by hydronephrosis: Patient status post nephrostomy drain.  Cultures were resistant to ampicillin, but otherwise pansensitive.  Patient received 5 days of Rocephin, and was transitioned to IV Ancef to be followed by Keflex to complete 10 days of antibiotics to be completed on 12/19. -Still needs outpatient follow-up with urology to discuss stone  management Leukocytosis/SIRS: WBC 15.4 which appears similar to when patient was discharged on 12/16.  She was also tachypneic meeting SIRS criteria, but likely secondary to fluid overload status.  She is otherwise afebrile -Check blood cultures Chronic kidney disease stage IIIb: Creatinine mildly elevated up to 1.23 with BUN 27 on admission.  Baseline creatinine had been around 1.1 at discharge on 12/16.  Suspect hypoperfusion related with fluid overload. -Continue to monitor kidney function with diuresis Protein calorie malnutrition/dysphagia: Patient has been seen by speech therapy during last hospitalization and recommended a dysphagia 1 diet. Essential hypertension Normocytic anemia: Stable hemoglobin 11.5 g/dL which appears near baseline that has ranged from 9 to 11 g/dL on average Hypothyroidism Carotid artery stenosis s/p endarterectomy Hyperlipidemia Debility: Patient bedbound at baseline.  Discharge planning: Ongoing. Should return to [name] with hospice after discharge. Family Contact: Left VM for EC [name].  IDG: Updated GOC: DNR  Should ambulance transport be needed please [facility] as they contract this service for all of our active hospice patients.   A Please do not hesitate to call with questions.    Thank you,    Elsie Saas, RN, [facility]       [name] Liaison (listed on [facility] under Hospice /[facility])     [phone]

## 2020-02-14 NOTE — Progress Notes (Signed)
Civil engineer, contracting [facility])  Ms. Tison was admitted to hospice services in her facility last night with [facility].  Unclear the chain of events that occurred that led to her being transported to the ED.  Her NOK is her minister Kelly--listed on facesheet, however patient is alert and oriented and makes her own decisions.  [facility] will follow while she is in the hospital.  Wallis Bamberg RN, BSN, CCRN [facility] Liaison

## 2020-02-14 NOTE — ED Notes (Signed)
Got patient bed changed patient was soaked in urine placed a external cath patient is now resting with call bell in reach got patient some warm blankets

## 2020-02-15 LAB — BASIC METABOLIC PANEL
Anion gap: 13 (ref 5–15)
BUN: 25 mg/dL — ABNORMAL HIGH (ref 8–23)
CO2: 25 mmol/L (ref 22–32)
Calcium: 8.5 mg/dL — ABNORMAL LOW (ref 8.9–10.3)
Chloride: 103 mmol/L (ref 98–111)
Creatinine, Ser: 1.11 mg/dL — ABNORMAL HIGH (ref 0.44–1.00)
GFR, Estimated: 46 mL/min — ABNORMAL LOW (ref >60)
Glucose, Bld: 81 mg/dL (ref 70–99)
Potassium: 3.9 mmol/L (ref 3.5–5.1)
Sodium: 141 mmol/L (ref 135–145)

## 2020-02-15 LAB — CBC WITH DIFFERENTIAL/PLATELET
Abs Immature Granulocytes: 0.19 10*3/uL — ABNORMAL HIGH (ref 0.00–0.07)
Basophils Absolute: 0.1 10*3/uL (ref 0.0–0.1)
Basophils Relative: 1 %
Eosinophils Absolute: 0.3 10*3/uL (ref 0.0–0.5)
Eosinophils Relative: 2 %
HCT: 34.1 % — ABNORMAL LOW (ref 36.0–46.0)
Hemoglobin: 10.9 g/dL — ABNORMAL LOW (ref 12.0–15.0)
Immature Granulocytes: 1 %
Lymphocytes Relative: 15 %
Lymphs Abs: 2 10*3/uL (ref 0.7–4.0)
MCH: 29.5 pg (ref 26.0–34.0)
MCHC: 32 g/dL (ref 30.0–36.0)
MCV: 92.4 fL (ref 80.0–100.0)
Monocytes Absolute: 0.8 10*3/uL (ref 0.1–1.0)
Monocytes Relative: 6 %
Neutro Abs: 10 10*3/uL — ABNORMAL HIGH (ref 1.7–7.7)
Neutrophils Relative %: 75 %
Platelets: 294 10*3/uL (ref 150–400)
RBC: 3.69 MIL/uL — ABNORMAL LOW (ref 3.87–5.11)
RDW: 15.5 % (ref 11.5–15.5)
WBC: 13.4 10*3/uL — ABNORMAL HIGH (ref 4.0–10.5)
nRBC: 0 % (ref 0.0–0.2)

## 2020-02-15 MED ORDER — ADULT MULTIVITAMIN W/MINERALS CH
1.0000 | ORAL_TABLET | Freq: Every day | ORAL | Status: DC
  Administered 2020-02-15 – 2020-02-23 (×9): 1 via ORAL
  Filled 2020-02-15 (×9): qty 1

## 2020-02-15 MED ORDER — ENSURE ENLIVE PO LIQD
237.0000 mL | Freq: Two times a day (BID) | ORAL | Status: DC
  Administered 2020-02-16: 237 mL via ORAL

## 2020-02-15 NOTE — Progress Notes (Addendum)
PROGRESS NOTE    [name]   LTJ:[id number]  DOB: 04-09-1924  DOA: 02/14/2020 PCP: Jarrett Soho, PA-C    Brief Narrative:   [name] is a 86 y.o. female with medical history significant of hypertension, peripheral vascular disease, carotid artery stenosis s/p endarterectomy, and bedbound presents with complaints of trouble breathing.   En route with EMS patient's O2 saturations were reportedly 84% on room air and improved to 100% on 4 L of nasal cannula oxygen.  Subjective: Confused. Cannot remember why she is in the hospital. Admits to feeling short of breath.    Assessment & Plan:   Principal Problem:   Acute on chronic diastolic CHF (congestive heart failure) with acute respiratory failure - cont IV Lasix today- still short of breath and has coarse crackles at bases with ongoing hypoxia  Active Problems:   CKD stage 3b - follow with diuresis.   HTN - cont Atenolol     Time spent in minutes: 35 DVT prophylaxis: enoxaparin (LOVENOX) injection 30 mg Start: 02/14/20 0830  Code Status: DNR Family Communication:  Disposition Plan:  Status is: Inpatient  Remains inpatient appropriate because:IV treatments appropriate due to intensity of illness or inability to take PO   Dispo: The patient is from: SNF              Anticipated d/c is to: SNF              Anticipated d/c date is: 1 day              Patient currently is not medically stable to d/c.      Consultants:   none Procedures:   none Antimicrobials:  Anti-infectives (From admission, onward)   None       Objective: Vitals:   02/14/20 1956 02/15/20 0150 02/15/20 0435 02/15/20 1158  BP: (!) 134/59 (!) 121/52 (!) 129/52 135/62  Pulse: 68 66 68 63  Resp: 14 18 18 16   Temp: 97.8 F (36.6 C) (!) 97.5 F (36.4 C) 98.1 F (36.7 C) 97.6 F (36.4 C)  TempSrc: Oral Oral Oral Oral  SpO2: 100% 100% 100% 100%  Weight:  57.8 kg    Height:        Intake/Output Summary (Last 24 hours) at  02/15/2020 1753 Last data filed at 02/15/2020 1647 Gross per 24 hour  Intake 250 ml  Output 2275 ml  Net -2025 ml   Filed Weights   02/14/20 0354 02/15/20 0150  Weight: 57.9 kg 57.8 kg    Examination: General exam: Appears comfortable  HEENT: PERRLA, oral mucosa moist, no sclera icterus or thrush Respiratory system: Crackles at bilateral bases on exam-  Respiratory effort normal. Cardiovascular system: S1 & S2 heard, RRR.   Gastrointestinal system: Abdomen soft, non-tender, nondistended. Normal bowel sounds. Central nervous system: Alert and oriented. No focal neurological deficits. Extremities: No cyanosis, clubbing or edema Skin: No rashes or ulcers Psychiatry:  Mood & affect appropriate.     Data Reviewed: I have personally reviewed following labs and imaging studies  CBC: Recent Labs  Lab 02/09/20 0051 02/14/20 0422 02/15/20 0103  WBC 15.3* 15.4* 13.4*  NEUTROABS 10.9* 12.1* 10.0*  HGB 10.3* 11.5* 10.9*  HCT 33.8* 37.4 34.1*  MCV 93.4 94.7 92.4  PLT 291 311 294   Basic Metabolic Panel: Recent Labs  Lab 02/09/20 0051 02/14/20 0422 02/15/20 0103  NA 145 141 141  K 3.8 3.8 3.9  CL 113* 105 103  CO2 23 25 25   GLUCOSE 113*  113* 81  BUN 28* 27* 25*  CREATININE 1.10* 1.23* 1.11*  CALCIUM 8.2* 9.1 8.5*  MG 1.9  --   --   PHOS 3.0  --   --    GFR: Estimated Creatinine Clearance: 25.2 mL/min (A) (by C-G formula based on SCr of 1.11 mg/dL (H)). Liver Function Tests: Recent Labs  Lab 02/09/20 0051  AST 24  ALT 10  ALKPHOS 76  BILITOT 0.6  PROT 6.3*  ALBUMIN 2.1*   No results for input(s): LIPASE, AMYLASE in the last 168 hours. No results for input(s): AMMONIA in the last 168 hours. Coagulation Profile: No results for input(s): INR, PROTIME in the last 168 hours. Cardiac Enzymes: No results for input(s): CKTOTAL, CKMB, CKMBINDEX, TROPONINI in the last 168 hours. BNP (last 3 results) No results for input(s): PROBNP in the last 8760 hours. HbA1C: No  results for input(s): HGBA1C in the last 72 hours. CBG: No results for input(s): GLUCAP in the last 168 hours. Lipid Profile: No results for input(s): CHOL, HDL, LDLCALC, TRIG, CHOLHDL, LDLDIRECT in the last 72 hours. Thyroid Function Tests: Recent Labs    02/14/20 1821  TSH 4.454   Anemia Panel: No results for input(s): VITAMINB12, FOLATE, FERRITIN, TIBC, IRON, RETICCTPCT in the last 72 hours. Urine analysis:    Component Value Date/Time   COLORURINE YELLOW 02/03/2020 2038   APPEARANCEUR CLOUDY (A) 02/03/2020 2038   LABSPEC 1.017 02/03/2020 2038   PHURINE 5.0 02/03/2020 2038   GLUCOSEU NEGATIVE 02/03/2020 2038   HGBUR MODERATE (A) 02/03/2020 2038   BILIRUBINUR NEGATIVE 02/03/2020 2038   KETONESUR NEGATIVE 02/03/2020 2038   PROTEINUR 100 (A) 02/03/2020 2038   UROBILINOGEN 0.2 04/10/2008 1519   NITRITE POSITIVE (A) 02/03/2020 2038   LEUKOCYTESUR LARGE (A) 02/03/2020 2038   Sepsis Labs: @LABRCNTIP (procalcitonin:4,lacticidven:4) ) Recent Results (from the past 240 hour(s))  Resp Panel by RT-PCR (Flu A&B, Covid) Nasopharyngeal Swab     Status: None   Collection Time: 02/09/20  9:11 AM   Specimen: Nasopharyngeal Swab; Nasopharyngeal(NP) swabs in vial transport medium  Result Value Ref Range Status   SARS Coronavirus 2 by RT PCR NEGATIVE NEGATIVE Final    Comment: (NOTE) SARS-CoV-2 target nucleic acids are NOT DETECTED.  The SARS-CoV-2 RNA is generally detectable in upper respiratory specimens during the acute phase of infection. The lowest concentration of SARS-CoV-2 viral copies this assay can detect is 138 copies/mL. A negative result does not preclude SARS-Cov-2 infection and should not be used as the sole basis for treatment or other patient management decisions. A negative result may occur with  improper specimen collection/handling, submission of specimen other than nasopharyngeal swab, presence of viral mutation(s) within the areas targeted by this assay, and  inadequate number of viral copies(<138 copies/mL). A negative result must be combined with clinical observations, patient history, and epidemiological information. The expected result is Negative.  Fact Sheet for Patients:  BloggerCourse.comhttps://www.fda.gov/media/152166/download  Fact Sheet for Healthcare Providers:  SeriousBroker.ithttps://www.fda.gov/media/152162/download  This test is no t yet approved or cleared by the [place] FDA and  has been authorized for detection and/or diagnosis of SARS-CoV-2 by FDA under an Emergency Use Authorization (EUA). This EUA will remain  in effect (meaning this test can be used) for the duration of the COVID-19 declaration under Section 564(b)(1) of the Act, 21 U.S.C.section 360bbb-3(b)(1), unless the authorization is terminated  or revoked sooner.       Influenza A by PCR NEGATIVE NEGATIVE Final   Influenza B by PCR NEGATIVE NEGATIVE Final  Comment: (NOTE) The Xpert Xpress SARS-CoV-2/FLU/RSV plus assay is intended as an aid in the diagnosis of influenza from Nasopharyngeal swab specimens and should not be used as a sole basis for treatment. Nasal washings and aspirates are unacceptable for Xpert Xpress SARS-CoV-2/FLU/RSV testing.  Fact Sheet for Patients: BloggerCourse.com  Fact Sheet for Healthcare Providers: SeriousBroker.it  This test is not yet approved or cleared by the [place] FDA and has been authorized for detection and/or diagnosis of SARS-CoV-2 by FDA under an Emergency Use Authorization (EUA). This EUA will remain in effect (meaning this test can be used) for the duration of the COVID-19 declaration under Section 564(b)(1) of the Act, 21 U.S.C. section 360bbb-3(b)(1), unless the authorization is terminated or revoked.  Performed at [facility], [address], [place], [place] [address]   Resp Panel by RT-PCR (Flu A&B, Covid) Nasopharyngeal Swab     Status: None   Collection Time:  02/14/20  4:22 AM   Specimen: Nasopharyngeal Swab; Nasopharyngeal(NP) swabs in vial transport medium  Result Value Ref Range Status   SARS Coronavirus 2 by RT PCR NEGATIVE NEGATIVE Final    Comment: (NOTE) SARS-CoV-2 target nucleic acids are NOT DETECTED.  The SARS-CoV-2 RNA is generally detectable in upper respiratory specimens during the acute phase of infection. The lowest concentration of SARS-CoV-2 viral copies this assay can detect is 138 copies/mL. A negative result does not preclude SARS-Cov-2 infection and should not be used as the sole basis for treatment or other patient management decisions. A negative result may occur with  improper specimen collection/handling, submission of specimen other than nasopharyngeal swab, presence of viral mutation(s) within the areas targeted by this assay, and inadequate number of viral copies(<138 copies/mL). A negative result must be combined with clinical observations, patient history, and epidemiological information. The expected result is Negative.  Fact Sheet for Patients:  BloggerCourse.com  Fact Sheet for Healthcare Providers:  SeriousBroker.it  This test is no t yet approved or cleared by the [place] FDA and  has been authorized for detection and/or diagnosis of SARS-CoV-2 by FDA under an Emergency Use Authorization (EUA). This EUA will remain  in effect (meaning this test can be used) for the duration of the COVID-19 declaration under Section 564(b)(1) of the Act, 21 U.S.C.section 360bbb-3(b)(1), unless the authorization is terminated  or revoked sooner.       Influenza A by PCR NEGATIVE NEGATIVE Final   Influenza B by PCR NEGATIVE NEGATIVE Final    Comment: (NOTE) The Xpert Xpress SARS-CoV-2/FLU/RSV plus assay is intended as an aid in the diagnosis of influenza from Nasopharyngeal swab specimens and should not be used as a sole basis for treatment. Nasal washings  and aspirates are unacceptable for Xpert Xpress SARS-CoV-2/FLU/RSV testing.  Fact Sheet for Patients: BloggerCourse.com  Fact Sheet for Healthcare Providers: SeriousBroker.it  This test is not yet approved or cleared by the [place] FDA and has been authorized for detection and/or diagnosis of SARS-CoV-2 by FDA under an Emergency Use Authorization (EUA). This EUA will remain in effect (meaning this test can be used) for the duration of the COVID-19 declaration under Section 564(b)(1) of the Act, 21 U.S.C. section 360bbb-3(b)(1), unless the authorization is terminated or revoked.  Performed at [facility], [address] [place], [place] [address]   Culture, blood (routine x 2)     Status: None (Preliminary result)   Collection Time: 02/14/20 11:03 AM   Specimen: BLOOD RIGHT ARM  Result Value Ref Range Status  Specimen Description BLOOD RIGHT ARM  Final   Special Requests   Final    BOTTLES DRAWN AEROBIC AND ANAEROBIC Blood Culture adequate volume   Culture   Final    NO GROWTH < 24 HOURS Performed at [facility], [address], [place], [place] [address]    Report Status PENDING  Incomplete  Culture, blood (routine x 2)     Status: None (Preliminary result)   Collection Time: 02/14/20  6:21 PM   Specimen: BLOOD  Result Value Ref Range Status   Specimen Description BLOOD BLOOD RIGHT FOREARM  Final   Special Requests   Final    BOTTLES DRAWN AEROBIC AND ANAEROBIC Blood Culture adequate volume   Culture   Final    NO GROWTH < 24 HOURS Performed at [facility], [address] [place], [place] [address]    Report Status PENDING  Incomplete         Radiology Studies: DG Chest Port 1 View  Result Date: 02/14/2020 CLINICAL DATA:  Shortness of breath EXAM: PORTABLE CHEST 1 VIEW COMPARISON:  Radiograph 02/03/2020 FINDINGS: Prior imaging demonstrates a background of some coarsened interstitial  and bronchitic features though overall, interstitial opacities have increased from prior with more hazy attenuation in the perihilar region and lower lungs. Vascular redistribution is noted as well. Small bilateral effusions. No pneumothorax. The cardiac silhouette is likely enlarged with dense calcification of mitral annulus. Aortic atherosclerotic calcification is noted as well. Remaining cardiomediastinal contours are unremarkable. No acute osseous or soft tissue abnormality. Degenerative changes are present in the imaged spine and shoulders. High-riding appearance of the bilateral humeral heads likely reflecting underlying rotator cuff insufficiency. Telemetry leads overlie the chest. IMPRESSION: Findings may reflect CHF/volume overload with cardiomegaly, interstitial and alveolar edema and small bilateral effusions. Background of chronic coarsened interstitial and bronchitic features. Electronically Signed   By: Kreg Shropshire M.D.   On: 02/14/2020 04:16      Scheduled Meds: . aspirin  81 mg Oral Daily  . atenolol  25 mg Oral Daily  . atorvastatin  20 mg Oral QHS  . clopidogrel  75 mg Oral Daily  . docusate sodium  100 mg Oral BID  . enoxaparin (LOVENOX) injection  30 mg Subcutaneous Q24H  . [START ON 02/16/2020] feeding supplement  237 mL Oral BID BM  . furosemide  20 mg Intravenous BID  . loratadine  10 mg Oral Daily  . melatonin  6 mg Oral QHS  . multivitamin with minerals  1 tablet Oral Daily  . Muscle Rub  1 application Topical BID  . polyvinyl alcohol  1 drop Both Eyes TID  . pramipexole  0.125 mg Oral QHS  . sodium chloride flush  3 mL Intravenous Q12H   Continuous Infusions: . sodium chloride       LOS: 1 day      Calvert Cantor, MD [facility] Pager: [email or web address] 02/15/2020, 5:53 PM

## 2020-02-15 NOTE — Progress Notes (Signed)
Initial Nutrition Assessment  DOCUMENTATION CODES:   Non-severe (moderate) malnutrition in context of chronic illness  INTERVENTION:   Patient currently on Dysphagia 1 diet per SLP evaluation.   Recommend feeding assistance at meal times to optimize po intake.   Chocolate Ensure Enlive po BID, each supplement provides 350 kcal and 20 grams of protein  MVI with minerals daily  NUTRITION DIAGNOSIS:   Moderate Malnutrition related to chronic illness (PVD s/p endarterectomy) as evidenced by moderate fat depletion,severe muscle depletion,mild muscle depletion.   GOAL:   Patient will meet greater than or equal to 90% of their needs   MONITOR:   PO intake,Supplement acceptance,Weight trends,Labs  REASON FOR ASSESSMENT:   Malnutrition Screening Tool    ASSESSMENT:   86 y.o. female with PMH of HTN, PVD, carotid endarterectomy and is bed bound. Admitted 12/10-12/16 to the ICU with septic shock and UTI complicated by hydronephrosis.  Presents from hospice with complaints of trouble breathing. Admitted for respiratory failure with hypoxia secondary to exacerbation of diastolic CHF.  Spoke with pt at bedside. Pt presents with mild confusion but was able to answer some questions.   Pt stated she eats 3 meals a day. Reports her appetite is decreased but she eats what she can. Pt states her food has to be pureed so she can eat it. Typical intake: B: oatmeal and orange juice, L: potato soup, D: soup or whatever she can find. Pt states she drinks chocolate Ensures at home. She also states she will take her medications with Ensure or with applesauce. Per chart review, pt consumed 0% of breakfast today. This is the only documented meal record. Noted grape juice at bedside during RD visit. Pt consumed 50% of grape juice.    Pt stated she does not know if she has lost weight. Unable to provide UBW. Per chart review, records lack weight recording over the last year. Weight at last admission on  12/16 was 57.9 kg. Weight during current admission: 57.8 kg.   Pt states she is able to walk with a walker. Per chart review, pt is bed bound.   Labs reviewed.   Medications reviewed and include: Colace, Lasix    NUTRITION - FOCUSED PHYSICAL EXAM:  Flowsheet Row Most Recent Value  Orbital Region Moderate depletion  Upper Arm Region Moderate depletion  Thoracic and Lumbar Region No depletion  Buccal Region Moderate depletion  Temple Region Severe depletion  Clavicle Bone Region Severe depletion  Clavicle and Acromion Bone Region Severe depletion  Scapular Bone Region Unable to assess  Dorsal Hand Severe depletion  Patellar Region Mild depletion  Anterior Thigh Region Mild depletion  Posterior Calf Region Mild depletion  Edema (RD Assessment) Mild  Hair Reviewed  Eyes Reviewed  Mouth Reviewed  Skin Reviewed  Nails Reviewed       Diet Order:   Diet Order            DIET - DYS 1 Room service appropriate? Yes with Assist; Fluid consistency: Thin  Diet effective now                 EDUCATION NEEDS:   Not appropriate for education at this time  Skin:  Skin Assessment: Skin Integrity Issues: Skin Integrity Issues:: Other (Comment) Other: MASD- R & L buttocks  Last BM:  12/21  Height:   Ht Readings from Last 1 Encounters:  02/14/20 5' (1.524 m)    Weight:   Wt Readings from Last 1 Encounters:  02/15/20 57.8 kg  BMI:  Body mass index is 24.89 kg/m.  Estimated Nutritional Needs:   Kcal:  1500-1700 kcal  Protein:  75-90 grams  Fluid:  >1.5 L/day    Placido Sou, Dietetic Intern Pager: [phone] If unavailable: [phone]

## 2020-02-15 NOTE — Consult Note (Signed)
   [facility] Inpatient Consult   02/15/2020  [name] 1924-12-20 [id number]  [facility] [[facility]]  Accountable Care Organization [ACO] Patient: [employer] [facility] [noted for admission review list]   Patient screened for unplanned readmission and for less than 7 days hospitalizations.  Medical record  Reviewed for  potential [facility]  [[facility]] Care Management service needs.  Review of patient's medical record reveals patient is active with Hospice and came from [place].  Plan:  Continue to follow progress and disposition to assess for post hospital care management needs.   Will sign off at transition if appropriate and patient continues with hospice for care needs.  Please place a [facility] Care Management consult as appropriate and for questions contact:   Charlesetta Shanks, RN BSN CCM [facility]  [phone] business mobile phone Toll free office [phone]  Fax number: [phone] [email or web address]

## 2020-02-15 NOTE — Progress Notes (Signed)
[facility] Hospitalized hospice patient visit.  [name] is new hospice patient admitted to [facility] hospice services yesterday with a hospice diagnosis of hypertensive heart disease. Patient became SOB, hypoxic, sats 83% with bradycardia, [name] activated EMS. Patient was transported to [facility] ED. Patient was admitted to [facility] on 02/14/2020 with a diagnosis of Acute respiratory failure with hypoxia and Acute pulmonary edema.  Per Dr. Kirt Boys with [facility] this is a related hospital admission.   Visited patient at bedside. No visitors present. Patient alert and oriented, sitting up in bed, denies pain or discomfort. She is on O2nc.  Is not eating today but taking POs well.   VS: 97.6, 135/62, 63, 16, 100% 2Lnc. I/O: 250/575  Abnormal Labs: Results for MARISOL, GIAMBRA (MRN [id number]) as of 02/15/2020 16:02  02/15/2020 01:03 BUN: 25 (H) Creatinine: 1.11 (H) Calcium: 8.5 (L) GFR, Estimated: 46 (L) WBC: 13.4 (H) RBC: 3.69 (L) Hemoglobin: 10.9 (L) HCT: 34.1 (L) NEUT#: 10.0 (H) Abs Immature Granulocytes: 0.19 (H) B Natriuretic Peptide: 1,172.7 (H)  IV/PRN meds: No PRNs given today  Problem list: Assessment/Plan Respiratory failure with hypoxia secondary to exacerbation of diastolic congestive heart failure: Acute.  Severe mitral regurgitation/moderate aortic stenosis/moderate tricuspid regurgitation Recent Klebsiella bacteremia/UTI complicated by hydronephrosis: Patient status post nephrostomy drain.  Cultures were resistant to ampicillin, but otherwise pansensitive.  Patient received 5 days of Rocephin, and was transitioned to IV Ancef to be followed by Keflex to complete 10 days of antibiotics to be completed on 12/19. -Still needs outpatient follow-up with urology to discuss stone management Leukocytosis/SIRS: WBC 15.4 which appears similar to when patient was discharged on 12/16.  She was also tachypneic meeting SIRS criteria, but likely secondary to fluid overload  status.  She is otherwise afebrile -Check blood cultures Chronic kidney disease stage IIIb: Creatinine mildly elevated up to 1.23 with BUN 27 on admission.  Baseline creatinine had been around 1.1 at discharge on 12/16.  Suspect hypoperfusion related with fluid overload. -Continue to monitor kidney function with diuresis Protein calorie malnutrition/dysphagia: Patient has been seen by speech therapy during last hospitalization and recommended a dysphagia 1 diet. Essential hypertension Normocytic anemia: Stable hemoglobin 11.5 g/dL which appears near baseline that has ranged from 9 to 11 g/dL on average Hypothyroidism Carotid artery stenosis s/p endarterectomy Hyperlipidemia Debility: Patient bedbound at baseline.  Discharge planning: Ongoing. Plan is to discharge tomorrow back to [facility].  Family Contact: Spoke with [name] (EC) to update.   IDG: Updated GOC: DNR   Should ambulance transport be needed please [facility] as they contract this service for all of our active hospice patients.   A Please do not hesitate to call with questions.    Thank you,    Elsie Saas, RN, [facility]       [facility] Liaison (listed on [facility] under Hospice /[facility])     [phone]

## 2020-02-15 NOTE — Progress Notes (Signed)
ReDS Clip Diuretic Study Pt study # [id number]  Your patient is in the Blinded arm of the ReDS Clip Diuretic study.  Your patient has had a ReDS reading and the reading has been transmitted to the cloud.   Thank You   The research team   Sharen Hones, PharmD, BCPS Heart Failure Stewardship Pharmacist Phone [phone]  Please check [email or web address] for unit-specific pharmacist phone numbers

## 2020-02-16 DIAGNOSIS — A4151 Sepsis due to Escherichia coli [E. coli]: Secondary | ICD-10-CM

## 2020-02-16 LAB — BLOOD CULTURE ID PANEL (REFLEXED) - BCID2

## 2020-02-16 LAB — BASIC METABOLIC PANEL
Anion gap: 11 (ref 5–15)
BUN: 28 mg/dL — ABNORMAL HIGH (ref 8–23)
CO2: 28 mmol/L (ref 22–32)
Calcium: 8.6 mg/dL — ABNORMAL LOW (ref 8.9–10.3)
Chloride: 101 mmol/L (ref 98–111)
Creatinine, Ser: 1.25 mg/dL — ABNORMAL HIGH (ref 0.44–1.00)
GFR, Estimated: 40 mL/min — ABNORMAL LOW (ref >60)
Glucose, Bld: 85 mg/dL (ref 70–99)
Potassium: 3.6 mmol/L (ref 3.5–5.1)
Sodium: 140 mmol/L (ref 135–145)

## 2020-02-16 MED ORDER — FUROSEMIDE 40 MG PO TABS: 40.0000 mg | ORAL_TABLET | Freq: Every day | ORAL | 11 refills | Status: AC | PRN

## 2020-02-16 MED ORDER — ERTAPENEM SODIUM 1 G IJ SOLR
500.0000 mg | INTRAMUSCULAR | Status: DC
  Filled 2020-02-16: qty 1

## 2020-02-16 MED ORDER — SODIUM CHLORIDE 0.9 % IV SOLN
500.0000 mg | INTRAVENOUS | Status: AC
  Administered 2020-02-16 – 2020-02-22 (×7): 500 mg via INTRAVENOUS
  Filled 2020-02-16 (×7): qty 0.5

## 2020-02-16 NOTE — Progress Notes (Signed)
[facility]) Hospitalized hospice patient visit.  [name] is new hospice patient admitted to [facility] hospice services yesterday with a hospice diagnosis of hypertensive heart disease. Patient became SOB, hypoxic, sats 83% with bradycardia, [name] activated EMS. Patient was transported to [facility] ED. Patient was admitted to [facility] on 02/14/2020 with a diagnosis of Acute respiratory failure with hypoxia and Acute pulmonary edema.  Per Dr. Kirt [name] with [facility] this is a related hospital admission.   Visited patient at bedside. No visitors present. Patient alert and oriented, sitting up in bed, denies pain or discomfort. She is on O2 however, they are trying to wean her due to normal oxygen levels. Patient complains of feeling like she cant breathe but oxygen levels have been 100% RA. Patient was going to discharge back to [place] but her HCPOA would like her to stay in hospital for course of antibiotic treatment then discharge. I have discussed this with MD, TOC, and POA.    VS: 97.5, 127/51, 64, 16, 100% 2LNC. I/O: 303/250  Abnormal Labs: BUN: 28 (H) Creatinine: 1.25 (H) Calcium: 8.6 (L) GFR, Estimated: 40 (L) Abs Immature Granulocytes: 0.19 (H) B Natriuretic Peptide: 1,172.7 (H)  IV:  ertapenem (INVANZ) 500 mg in sodium chloride 0.9 % 50 mL IVPB Dose: 500 mg Freq: Every 24 hours Route: IV  No PRN given  Problem list: Principal Problem: Acute on chronic diastolic CHF (congestive heart failure) with acute respiratory failure COVID and Influenza negative -treated with IV Lasix x 2 days and has improved  - per ECHO from 02/04/20, she has a normal EF (55-60%) and diastolic function was not able to be determined. The ECHO further mentions severe mitral calcification with mod-severe MR, mod aortic valve stenosis and mild AI - from the information I have received, she has recently been transitioned to hospice and is now receiving outpatient hospice services.  In this case, I do not feel we should be very aggressive and transfer back to the hospital in case of another exacerbation. To prevent another exacerbation, we can use Lasix 40 mg daily PRN based on her symptoms at [facility]. From what I have noted, her oral intake is not very good and daily Lasix would result in rapid dehydration.  Active Problems:  Recent sepsis with Klebsiella bacteremia and UTI and obstructing nephrolithiasis - she still has a percutaneous nephrostomy tube - WBC count remains slightly elevated and one set of blood culture growing gram neg rods-per BCID this is an ESBL producing e coli- I have spoken with her caretaker (pastor), [name] who would like to initiate IV antibiotics to treat this infection   CKD stage 3b -Cr at baseline which appears to be 1.1-1.3   Discharge planning: Ongoing. Plan is to discharge back to [place] once antibiotic treatment complete.  Family Contact: Spoke with [name] (EC) to update.   IDG: Updated GOC: DNR   Should ambulance transport be needed please [facility] as they contract this service for all of our active hospice patients.  A Please do not hesitate to call with questions.  Thank you,  [name], BSN, [facility] (in [place]) [phone]

## 2020-02-16 NOTE — Plan of Care (Signed)
  Problem: Cardiac: Goal: Ability to achieve and maintain adequate cardiopulmonary perfusion will improve Outcome: Progressing   Problem: Clinical Measurements: Goal: Will remain free from infection Outcome: Progressing Goal: Diagnostic test results will improve Outcome: Progressing Goal: Respiratory complications will improve Outcome: Progressing   Problem: Elimination: Goal: Will not experience complications related to bowel motility Outcome: Progressing Goal: Will not experience complications related to urinary retention Outcome: Progressing   Problem: Education: Goal: Ability to demonstrate management of disease process will improve Outcome: Not Progressing Goal: Ability to verbalize understanding of medication therapies will improve Outcome: Not Progressing   Problem: Activity: Goal: Capacity to carry out activities will improve Outcome: Not Progressing   Problem: Education: Goal: Knowledge of General Education information will improve Description: Including pain rating scale, medication(s)/side effects and non-pharmacologic comfort measures Outcome: Not Progressing

## 2020-02-16 NOTE — Progress Notes (Addendum)
PHARMACY - PHYSICIAN COMMUNICATION CRITICAL VALUE ALERT - BLOOD CULTURE IDENTIFICATION (BCID)  [name] is an 86 y.o. female who presented to [facility] on 02/14/2020 with a chief complaint of shortness of breath.   Assessment:  86 year old female admitted with trouble breathing. Noted with recent admission from 12/10 to 12/16 for Klebsiella UTI and bacteremia with associated hydronephrosis and obstructing renal calculus.  A nephrostomy tube was placed on 12/11 and she was discharge with cephalexin to complete 10 days of antibiotics. Now with ESBL E coli in 1/4 blood cultures. Source not totally clear but potentially urinary.   Name of physician (or Provider) Contacted: Rizwan   Current antibiotics: None   Changes to prescribed antibiotics recommended:  Recommendations accepted by provider  Add Ertapenem 500 mg q 24 hours   Results for orders placed or performed during the hospital encounter of 02/14/20  Blood Culture ID Panel (Reflexed) (Collected: 02/14/2020  6:21 PM)  Result Value Ref Range   Enterococcus faecalis NOT DETECTED NOT DETECTED   Enterococcus Faecium NOT DETECTED NOT DETECTED   Listeria monocytogenes NOT DETECTED NOT DETECTED   Staphylococcus species NOT DETECTED NOT DETECTED   Staphylococcus aureus (BCID) NOT DETECTED NOT DETECTED   Staphylococcus epidermidis NOT DETECTED NOT DETECTED   Staphylococcus lugdunensis NOT DETECTED NOT DETECTED   Streptococcus species NOT DETECTED NOT DETECTED   Streptococcus agalactiae NOT DETECTED NOT DETECTED   Streptococcus pneumoniae NOT DETECTED NOT DETECTED   Streptococcus pyogenes NOT DETECTED NOT DETECTED   A.calcoaceticus-baumannii NOT DETECTED NOT DETECTED   Bacteroides fragilis NOT DETECTED NOT DETECTED   Enterobacterales DETECTED (A) NOT DETECTED   Enterobacter cloacae complex NOT DETECTED NOT DETECTED   Escherichia coli DETECTED (A) NOT DETECTED   Klebsiella aerogenes NOT DETECTED NOT DETECTED   Klebsiella oxytoca NOT  DETECTED NOT DETECTED   Klebsiella pneumoniae NOT DETECTED NOT DETECTED   Proteus species NOT DETECTED NOT DETECTED   Salmonella species NOT DETECTED NOT DETECTED   Serratia marcescens NOT DETECTED NOT DETECTED   Haemophilus influenzae NOT DETECTED NOT DETECTED   Neisseria meningitidis NOT DETECTED NOT DETECTED   Pseudomonas aeruginosa NOT DETECTED NOT DETECTED   Stenotrophomonas maltophilia NOT DETECTED NOT DETECTED   Candida albicans NOT DETECTED NOT DETECTED   Candida auris NOT DETECTED NOT DETECTED   Candida glabrata NOT DETECTED NOT DETECTED   Candida krusei NOT DETECTED NOT DETECTED   Candida parapsilosis NOT DETECTED NOT DETECTED   Candida tropicalis NOT DETECTED NOT DETECTED   Cryptococcus neoformans/gattii NOT DETECTED NOT DETECTED   CTX-M ESBL DETECTED (A) NOT DETECTED   Carbapenem resistance IMP NOT DETECTED NOT DETECTED   Carbapenem resistance KPC NOT DETECTED NOT DETECTED   Carbapenem resistance NDM NOT DETECTED NOT DETECTED   Carbapenem resist OXA 48 LIKE NOT DETECTED NOT DETECTED   Carbapenem resistance VIM NOT DETECTED NOT DETECTED    Sharin Mons, PharmD, BCPS, BCIDP Infectious Diseases Clinical Pharmacist Phone: [phone] 02/16/2020  10:27 AM

## 2020-02-16 NOTE — Progress Notes (Signed)
PROGRESS NOTE    [name]   WUJ:[id number]  DOB: 09-Nov-1924  DOA: 02/14/2020 PCP: Jarrett SohoWharton, Courtney, PA-C    Brief Narrative:   Tamara DandyMary Woodsis a 86 [place] with medical history significant ofhypertension, peripheral vascular disease, carotid artery stenosiss/pendarterectomy, who is bedbound and presents with complaints of trouble breathing. She was admitted to the hospital from 02/03/20 through 02/09/20 for sepsis related to Klebsiella UTI complicated by an obstructing renal calculus  and bacteremia for which she received a nephrostomy tube. She also was found to have an NSTEMI during this admission. Palliative care discussions were started and hospice was initiated at the facility in which she was residing.   Enroute with EMS patient's O2 saturations were reportedly 84% on room air and improved to 100% on 4 L of nasal cannula oxygen.  CXR > Findings may reflect CHF/volume overload with cardiomegaly, interstitial and alveolar edema and small bilateral effusions. Background of chronic coarsened interstitial and bronchitic features.   Subjective: She is stating she has ocsasional shortness of breath but does not have it now.    Assessment & Plan:   Principal Problem:   Acute on chronic diastolic CHF (congestive heart failure) with acute respiratory failure COVID and Influenza negative - treated with IV Lasix x 2 days and has improved  - per ECHO from 02/04/20, she has a normal EF (55-60%) and diastolic function was not able to be determined. The ECHO further mentions severe mitral calcification with mod-severe MR, mod aortic valve stenosis and mild AI - from the information I have received, she has recently been transitioned to hospice and is now receiving outpatient hospice services. In this case, I do not feel we should be very aggressive and transfer back to the hospital in case of another exacerbation. To prevent another exacerbation, we can  use Lasix 40 mg daily PRN based [facility] [place] [facility]. From what I have noted, her oral intake is not very good and daily Lasix would result in rapid dehydration.  Active Problems:  Recent sepsis with Klebsiella bacteremia and UTI and obstructing nephrolithiasis - she still has a percutaneous nephrostomy tube -   WBC count remains slightly elevated and one set of blood culture growing gram neg rods-per BCID this is an ESBL producing e coli- I have spoken with her caretaker (pastor), Egbert GaribaldiKelly Groce who would like to initiate IV antibiotics to treat this infection   CKD stage 3b - Cr at baseline which appears to be 1.1-1.3    Time spent in minutes: 35 DVT prophylaxis: enoxaparin (LOVENOX) injection 30 mg Start: 02/14/20 0830  Code Status: DNR Family Communication:  Disposition Plan:  Status is: Inpatient  Remains inpatient appropriate because:IV treatments appropriate due to intensity of illness or inability to take PO   Dispo: The patient is from: SNF              Anticipated d/c is to: SNF              Anticipated d/c date is: 1 day              Patient currently is not medically stable to d/c.      Consultants:   none Procedures:   none Antimicrobials:  Anti-infectives (From admission, onward)   Start     Dose/Rate Route Frequency Ordered Stop   02/16/20 1145  ertapenem (INVANZ) 500 mg in sodium chloride 0.9 % 50 mL IVPB        500 mg 100  mL/hr over 30 Minutes Intravenous Every 24 hours 02/16/20 1051     02/16/20 1130  ertapenem [facility]) injection 500 mg  Status:  Discontinued        500 mg Intramuscular Every 24 hours 02/16/20 1035 02/16/20 1051       Objective: Vitals:   02/15/20 1158 02/15/20 2007 02/16/20 0500 02/16/20 0927  BP: 135/62 (!) 125/46 (!) 111/47 119/69  Pulse: 63 67 69 68  Resp: 16     Temp: 97.6 F (36.4 C) 98.2 F (36.8 C)    TempSrc: Oral Oral    SpO2: 100% 98% 100%   Weight:      Height:        Intake/Output Summary  (Last 24 hours) at 02/16/2020 1056 Last data filed at 02/16/2020 0931 Gross per 24 hour  Intake 423 ml  Output 2000 ml  Net -1577 ml   Filed Weights   02/14/20 0354 02/15/20 0150  Weight: 57.9 kg 57.8 kg    Examination: General exam: Appears comfortable  HEENT: PERRLA, oral mucosa moist, no sclera icterus or thrush Respiratory system: Crackles at bilateral bases on exam-  Respiratory effort normal. Cardiovascular system: S1 & S2 heard, RRR.   Gastrointestinal system: Abdomen soft, non-tender, nondistended. Normal bowel sounds. Central nervous system: Alert and oriented. No focal neurological deficits. Extremities: No cyanosis, clubbing or edema Skin: No rashes or ulcers Psychiatry:  Mood & affect appropriate.     Data Reviewed: I have personally reviewed following labs and imaging studies  CBC: Recent Labs  Lab 02/14/20 0422 02/15/20 0103  WBC 15.4* 13.4*  NEUTROABS 12.1* 10.0*  HGB 11.5* 10.9*  HCT 37.4 34.1*  MCV 94.7 92.4  PLT 311 294   Basic Metabolic Panel: Recent Labs  Lab 02/14/20 0422 02/15/20 0103 02/16/20 0257  NA 141 141 140  K 3.8 3.9 3.6  CL 105 103 101  CO2 25 25 28   GLUCOSE 113* 81 85  BUN 27* 25* 28*  CREATININE 1.23* 1.11* 1.25*  CALCIUM 9.1 8.5* 8.6*   GFR: Estimated Creatinine Clearance: 22.4 mL/min (A) (by C-G formula based on SCr of 1.25 mg/dL (H)). Liver Function Tests: No results for input(s): AST, ALT, ALKPHOS, BILITOT, PROT, ALBUMIN in the last 168 hours. No results for input(s): LIPASE, AMYLASE in the last 168 hours. No results for input(s): AMMONIA in the last 168 hours. Coagulation Profile: No results for input(s): INR, PROTIME in the last 168 hours. Cardiac Enzymes: No results for input(s): CKTOTAL, CKMB, CKMBINDEX, TROPONINI in the last 168 hours. BNP (last 3 results) No results for input(s): PROBNP in the last 8760 hours. HbA1C: No results for input(s): HGBA1C in the last 72 hours. CBG: No results for input(s): GLUCAP  in the last 168 hours. Lipid Profile: No results for input(s): CHOL, HDL, LDLCALC, TRIG, CHOLHDL, LDLDIRECT in the last 72 hours. Thyroid Function Tests: Recent Labs    02/14/20 1821  TSH 4.454   Anemia Panel: No results for input(s): VITAMINB12, FOLATE, FERRITIN, TIBC, IRON, RETICCTPCT in the last 72 hours. Urine analysis:    Component Value Date/Time   COLORURINE YELLOW 02/03/2020 2038   APPEARANCEUR CLOUDY (A) 02/03/2020 2038   LABSPEC 1.017 02/03/2020 2038   PHURINE 5.0 02/03/2020 2038   GLUCOSEU NEGATIVE 02/03/2020 2038   HGBUR MODERATE (A) 02/03/2020 2038   BILIRUBINUR NEGATIVE 02/03/2020 2038   KETONESUR NEGATIVE 02/03/2020 2038   PROTEINUR 100 (A) 02/03/2020 2038   UROBILINOGEN 0.2 04/10/2008 1519   NITRITE POSITIVE (A) 02/03/2020 2038   LEUKOCYTESUR  LARGE (A) 02/03/2020 2038   Sepsis Labs: @LABRCNTIP (procalcitonin:4,lacticidven:4) ) Recent Results (from the past 240 hour(s))  Resp Panel by RT-PCR (Flu A&B, Covid) Nasopharyngeal Swab     Status: None   Collection Time: 02/09/20  9:11 AM   Specimen: Nasopharyngeal Swab; Nasopharyngeal(NP) swabs in vial transport medium  Result Value Ref Range Status   SARS Coronavirus 2 by RT PCR NEGATIVE NEGATIVE Final    Comment: (NOTE) SARS-CoV-2 target nucleic acids are NOT DETECTED.  The SARS-CoV-2 RNA is generally detectable in upper respiratory specimens during the acute phase of infection. The lowest concentration of SARS-CoV-2 viral copies this assay can detect is 138 copies/mL. A negative result does not preclude SARS-Cov-2 infection and should not be used as the sole basis for treatment or other patient management decisions. A negative result may occur with  improper specimen collection/handling, submission of specimen other than nasopharyngeal swab, presence of viral mutation(s) within the areas targeted by this assay, and inadequate number of viral copies(<138 copies/mL). A negative result must be combined  with clinical observations, patient history, and epidemiological information. The expected result is Negative.  Fact Sheet for Patients:  02/11/20  Fact Sheet for Healthcare Providers:  BloggerCourse.com  This test is no t yet approved or cleared by the [place] FDA and  has been authorized for detection and/or diagnosis of SARS-CoV-2 by FDA under an Emergency Use Authorization (EUA). This EUA will remain  in effect (meaning this test can be used) for the duration of the COVID-19 declaration under Section 564(b)(1) of the Act, 21 U.S.C.section 360bbb-3(b)(1), unless the authorization is terminated  or revoked sooner.       Influenza A by PCR NEGATIVE NEGATIVE Final   Influenza B by PCR NEGATIVE NEGATIVE Final    Comment: (NOTE) The Xpert Xpress SARS-CoV-2/FLU/RSV plus assay is intended as an aid in the diagnosis of influenza from Nasopharyngeal swab specimens and should not be used as a sole basis for treatment. Nasal washings and aspirates are unacceptable for Xpert Xpress SARS-CoV-2/FLU/RSV testing.  Fact Sheet for Patients: [place]  Fact Sheet for Healthcare Providers: BloggerCourse.com  This test is not yet approved or cleared by the [place] FDA and has been authorized for detection and/or diagnosis of SARS-CoV-2 by FDA under an Emergency Use Authorization (EUA). This EUA will remain in effect (meaning this test can be used) for the duration of the COVID-19 declaration under Section 564(b)(1) of the Act, 21 U.S.C. section 360bbb-3(b)(1), unless the authorization is terminated or revoked.  Performed at [facility], [address] [place], [place]   Resp Panel by RT-PCR (Flu A&B, Covid) Nasopharyngeal Swab     Status: None   Collection Time: 02/14/20  4:22 AM   Specimen: Nasopharyngeal Swab; Nasopharyngeal(NP) swabs in vial  transport medium  Result Value Ref Range Status   SARS Coronavirus 2 by RT PCR NEGATIVE NEGATIVE Final    Comment: (NOTE) SARS-CoV-2 target nucleic acids are NOT DETECTED.  The SARS-CoV-2 RNA is generally detectable in upper respiratory specimens during the acute phase of infection. The lowest concentration of SARS-CoV-2 viral copies this assay can detect is 138 copies/mL. A negative result does not preclude SARS-Cov-2 infection and should not be used as the sole basis for treatment or other patient management decisions. A negative result may occur with  improper specimen collection/handling, submission of specimen other than nasopharyngeal swab, presence of viral mutation(s) within the areas targeted by this assay, and inadequate number of viral copies(<138 copies/mL). A negative  result must be combined with clinical observations, patient history, and epidemiological information. The expected result is Negative.  Fact Sheet for Patients:  BloggerCourse.com  Fact Sheet for Healthcare Providers:  [employer]ious[employer]ker.it  This test is no t yet approved or cleared by the [place] FDA and  has been authorized for detection and/or diagnosis of SARS-CoV-2 by FDA under an Emergency Use Authorization (EUA). This EUA will remain  in effect (meaning this test can be used) for the duration of the COVID-19 declaration under Section 564(b)(1) of the Act, 21 U.S.C.section 360bbb-3(b)(1), unless the authorization is terminated  or revoked sooner.       Influenza A by PCR NEGATIVE NEGATIVE Final   Influenza B by PCR NEGATIVE NEGATIVE Final    Comment: (NOTE) The Xpert Xpress SARS-CoV-2/FLU/RSV plus assay is intended as an aid in the diagnosis of influenza from Nasopharyngeal swab specimens and should not be used as a sole basis for treatment. Nasal washings and aspirates are unacceptable for Xpert Xpress SARS-CoV-2/FLU/RSV testing.  Fact  Sheet for Patients: BloggerCourse.com  Fact Sheet for Healthcare Providers: [employer]ker.it  This test is not yet approved or cleared by the [place] FDA and has been authorized for detection and/or diagnosis of SARS-CoV-2 by FDA under an Emergency Use Authorization (EUA). This EUA will remain in effect (meaning this test can be used) for the duration of the COVID-19 declaration under Section 564(b)(1) of the Act, 21 U.S.C. section 360bbb-3(b)(1), unless the authorization is terminated or revoked.  Performed at [facility], [address], [place], [place] [address]   Culture, blood (routine x 2)     Status: None (Preliminary result)   Collection Time: 02/14/20 11:03 AM   Specimen: BLOOD RIGHT ARM  Result Value Ref Range Status   Specimen Description BLOOD RIGHT ARM  Final   Special Requests   Final    BOTTLES DRAWN AEROBIC AND ANAEROBIC Blood Culture adequate volume   Culture   Final    NO GROWTH 2 DAYS Performed at [facility], [address], [place], [place] [address]    Report Status PENDING  Incomplete  Culture, blood (routine x 2)     Status: None (Preliminary result)   Collection Time: 02/14/20  6:21 PM   Specimen: BLOOD  Result Value Ref Range Status   Specimen Description BLOOD BLOOD RIGHT FOREARM  Final   Special Requests   Final    BOTTLES DRAWN AEROBIC AND ANAEROBIC Blood Culture adequate volume   Culture  Setup Time   Final    GRAM NEGATIVE RODS ANAEROBIC BOTTLE ONLY Organism ID to follow CRITICAL RESULT CALLED TO, READ BACK BY AND VERIFIED WITH: Norva Riffle PharmD 10:25 02/16/20 ([email or web address]) Performed at [facility], [address], [place], [place] [address]    Culture GRAM NEGATIVE RODS  Final   Report Status PENDING  Incomplete  Blood Culture ID Panel (Reflexed)     Status: Abnormal   Collection Time: 02/14/20  6:21 PM  Result Value Ref Range Status   Enterococcus faecalis NOT  DETECTED NOT DETECTED Final   Enterococcus Faecium NOT DETECTED NOT DETECTED Final   Listeria monocytogenes NOT DETECTED NOT DETECTED Final   Staphylococcus species NOT DETECTED NOT DETECTED Final   Staphylococcus aureus (BCID) NOT DETECTED NOT DETECTED Final   Staphylococcus epidermidis NOT DETECTED NOT DETECTED Final   Staphylococcus lugdunensis NOT DETECTED NOT DETECTED Final   Streptococcus species NOT DETECTED NOT DETECTED Final   Streptococcus agalactiae NOT DETECTED NOT  DETECTED Final   Streptococcus pneumoniae NOT DETECTED NOT DETECTED Final   Streptococcus pyogenes NOT DETECTED NOT DETECTED Final   A.calcoaceticus-baumannii NOT DETECTED NOT DETECTED Final   Bacteroides fragilis NOT DETECTED NOT DETECTED Final   Enterobacterales DETECTED (A) NOT DETECTED Final    Comment: Enterobacterales represent a large order of gram negative bacteria, not a single organism. CRITICAL RESULT CALLED TO, READ BACK BY AND VERIFIED WITH: Norva Riffle PharmD 10:25 02/16/20 ([email or web address])    Enterobacter cloacae complex NOT DETECTED NOT DETECTED Final   Escherichia coli DETECTED (A) NOT DETECTED Final    Comment: CRITICAL RESULT CALLED TO, READ BACK BY AND VERIFIED WITH: Norva Riffle PharmD 10:25 02/16/20 ([email or web address])    Klebsiella aerogenes NOT DETECTED NOT DETECTED Final   Klebsiella oxytoca NOT DETECTED NOT DETECTED Final   Klebsiella pneumoniae NOT DETECTED NOT DETECTED Final   Proteus species NOT DETECTED NOT DETECTED Final   Salmonella species NOT DETECTED NOT DETECTED Final   Serratia marcescens NOT DETECTED NOT DETECTED Final   Haemophilus influenzae NOT DETECTED NOT DETECTED Final   Neisseria meningitidis NOT DETECTED NOT DETECTED Final   Pseudomonas aeruginosa NOT DETECTED NOT DETECTED Final   Stenotrophomonas maltophilia NOT DETECTED NOT DETECTED Final   Candida albicans NOT DETECTED NOT DETECTED Final   Candida auris NOT DETECTED NOT DETECTED Final   Candida glabrata NOT DETECTED NOT DETECTED  Final   Candida krusei NOT DETECTED NOT DETECTED Final   Candida parapsilosis NOT DETECTED NOT DETECTED Final   Candida tropicalis NOT DETECTED NOT DETECTED Final   Cryptococcus neoformans/gattii NOT DETECTED NOT DETECTED Final   CTX-M ESBL DETECTED (A) NOT DETECTED Final    Comment: CRITICAL RESULT CALLED TO, READ BACK BY AND VERIFIED WITH: Norva Riffle PharmD 10:25 02/16/20 ([email or web address]) (NOTE) Extended spectrum beta-lactamase detected. Recommend a carbapenem as initial therapy.      Carbapenem resistance IMP NOT DETECTED NOT DETECTED Final   Carbapenem resistance KPC NOT DETECTED NOT DETECTED Final   Carbapenem resistance NDM NOT DETECTED NOT DETECTED Final   Carbapenem resist OXA 48 LIKE NOT DETECTED NOT DETECTED Final   Carbapenem resistance VIM NOT DETECTED NOT DETECTED Final    Comment: Performed at [facility], [address], [place], [place] [address]         Radiology Studies: No results found.    Scheduled Meds: . aspirin  81 mg Oral Daily  . atenolol  25 mg Oral Daily  . atorvastatin  20 mg Oral QHS  . clopidogrel  75 mg Oral Daily  . docusate sodium  100 mg Oral BID  . enoxaparin (LOVENOX) injection  30 mg Subcutaneous Q24H  . loratadine  10 mg Oral Daily  . melatonin  6 mg Oral QHS  . multivitamin with minerals  1 tablet Oral Daily  . Muscle Rub  1 application Topical BID  . polyvinyl alcohol  1 drop Both Eyes TID  . pramipexole  0.125 mg Oral QHS  . sodium chloride flush  3 mL Intravenous Q12H   Continuous Infusions: . sodium chloride    . ertapenem       LOS: 2 days      Calvert Cantor, MD [facility] Pager: [email or web address] 02/16/2020, 10:56 AM

## 2020-02-17 DIAGNOSIS — R7881 Bacteremia: Secondary | ICD-10-CM

## 2020-02-17 LAB — BASIC METABOLIC PANEL
Anion gap: 17 — ABNORMAL HIGH (ref 5–15)
BUN: 30 mg/dL — ABNORMAL HIGH (ref 8–23)
CO2: 22 mmol/L (ref 22–32)
Calcium: 8.9 mg/dL (ref 8.9–10.3)
Chloride: 98 mmol/L (ref 98–111)
Creatinine, Ser: 1.32 mg/dL — ABNORMAL HIGH (ref 0.44–1.00)
GFR, Estimated: 38 mL/min — ABNORMAL LOW (ref >60)
Glucose, Bld: 95 mg/dL (ref 70–99)
Potassium: 4.9 mmol/L (ref 3.5–5.1)
Sodium: 137 mmol/L (ref 135–145)

## 2020-02-17 NOTE — Progress Notes (Signed)
PROGRESS NOTE    [name]   QIO:[id number]  DOB: 20-Feb-1925  DOA: 02/14/2020 PCP: Jarrett Soho, PA-C    Brief Narrative:   Merridy Woodsis a 86 y.o.femalesent from [place] with medical history significant ofhypertension, peripheral vascular disease, carotid artery stenosiss/pendarterectomy, who is bedbound and presents with complaints of trouble breathing. She was admitted to the hospital from 02/03/20 through 02/09/20 for sepsis related to Klebsiella UTI complicated by an obstructing renal calculus  and bacteremia for which she received a nephrostomy tube. She also was found to have an NSTEMI during this admission. Palliative care discussions were started and hospice was initiated at the facility in which she was residing.   Enroute with EMS patient's O2 saturations were reportedly 84% on room air and improved to 100% on 4 L of nasal cannula oxygen.  CXR > Findings may reflect CHF/volume overload with cardiomegaly, interstitial and alveolar edema and small bilateral effusions. Background of chronic coarsened interstitial and bronchitic features.   Subjective: No complaints other than wanting to go home.    Assessment & Plan:   Principal Problem:   Acute on chronic diastolic CHF (congestive heart failure) with acute respiratory failure COVID and Influenza negative - treated with IV Lasix x 2 days and has improved  - per ECHO from 02/04/20, she has a normal EF (55-60%) and diastolic function was not able to be determined. The ECHO further mentions severe mitral calcification with mod-severe MR, mod aortic valve stenosis and mild AI - from the information I have received, she has recently been transitioned to hospice and is now receiving outpatient hospice services. In this case, I do not feel we should be very aggressive and transfer back to the hospital in case of another exacerbation. To prevent another exacerbation, we can use Lasix 40 mg daily PRN based on  her symptoms at [facility]. From what I have noted, her oral intake is not very good and daily Lasix would result in rapid dehydration.  Active Problems:  Recent sepsis with Klebsiella bacteremia and UTI and obstructing nephrolithiasis - she still has a percutaneous nephrostomy tube -   WBC count remains slightly elevated and one set of blood culture growing gram neg rods-per BC[facility] this is an ESBL producing e coli- I have spoken with her caretaker (pastor), [name] who would like to initiate IV antibiotics to treat this infection - cont Ertapenem    CKD stage 3b - Cr at baseline which appears to be 1.1-1.3  Poor oral intake - does not eat much solid food but does drink liquids well    Time spent in minutes: 35 DVT prophylaxis: enoxaparin (LOVENOX) injection 30 mg Start: 02/14/20 0830  Code Status: DNR Family Communication:  Disposition Plan:  Status is: Inpatient  Remains inpatient appropriate because:IV treatments appropriate due to intensity of illness or inability to take PO   Dispo: The patient is from: SNF              Anticipated d/c is to: SNF              Anticipated d/c date is: 1 day              Patient currently is not medically stable to d/c.   Consultants:   none Procedures:   none Antimicrobials:  Anti-infectives (From admission, onward)   Start     Dose/Rate Route Frequency Ordered Stop   02/16/20 1145  ertapenem (INVANZ) 500 mg in sodium chloride 0.9 % 50 mL IVPB  500 mg 100 mL/hr over 30 Minutes Intravenous Every 24 hours 02/16/20 1051     02/16/20 1130  ertapenem [facility]) injection 500 mg  Status:  Discontinued        500 mg Intramuscular Every 24 hours 02/16/20 1035 02/16/20 1051       Objective: Vitals:   02/16/20 0927 02/16/20 1147 02/16/20 1953 02/17/20 0344  BP: 119/69 (!) 127/51 (!) 143/63 (!) 145/62  Pulse: 68 64 73 91  Resp:  16 19 (!) 21  Temp:  (!) 97.5 F (36.4 C) 98.3 F (36.8 C) 99.5 F (37.5 C)  TempSrc:   Oral Oral Oral  SpO2:  100% 97% 91%  Weight:    64.9 kg  Height:        Intake/Output Summary (Last 24 hours) at 02/17/2020 1409 Last data filed at 02/17/2020 1100 Gross per 24 hour  Intake 670 ml  Output 800 ml  Net -130 ml   Filed Weights   02/14/20 0354 02/15/20 0150 02/17/20 0344  Weight: 57.9 kg 57.8 kg 64.9 kg    Examination: General exam: Appears comfortable  HEENT: PERRLA, oral mucosa moist, no sclera icterus or thrush Respiratory system: Clear to auscultation. Respiratory effort normal. Cardiovascular system: S1 & S2 heard,  No murmurs  Gastrointestinal system: Abdomen soft, non-tender, nondistended. Normal bowel sounds   Central nervous system: Alert and oriented. No focal neurological deficits. Extremities: No cyanosis, clubbing or edema Skin: No rashes or ulcers Psychiatry:  Mood & affect appropriate.     Data Reviewed: I have personally reviewed following labs and imaging studies  CBC: Recent Labs  Lab 02/14/20 0422 02/15/20 0103  WBC 15.4* 13.4*  NEUTROABS 12.1* 10.0*  HGB 11.5* 10.9*  HCT 37.4 34.1*  MCV 94.7 92.4  PLT 311 294   Basic Metabolic Panel: Recent Labs  Lab 02/14/20 0422 02/15/20 0103 02/16/20 0257 02/17/20 0143  NA 141 141 140 137  K 3.8 3.9 3.6 4.9  CL 105 103 101 98  CO2 25 25 28 22   GLUCOSE 113* 81 85 95  BUN 27* 25* 28* 30*  CREATININE 1.23* 1.11* 1.25* 1.32*  CALCIUM 9.1 8.5* 8.6* 8.9   GFR: Estimated Creatinine Clearance: 22.4 mL/min (A) (by C-G formula based on SCr of 1.32 mg/dL (H)). Liver Function Tests: No results for input(s): AST, ALT, ALKPHOS, BILITOT, PROT, ALBUMIN in the last 168 hours. No results for input(s): LIPASE, AMYLASE in the last 168 hours. No results for input(s): AMMONIA in the last 168 hours. Coagulation Profile: No results for input(s): INR, PROTIME in the last 168 hours. Cardiac Enzymes: No results for input(s): CKTOTAL, CKMB, CKMBINDEX, TROPONINI in the last 168 hours. BNP (last 3  results) No results for input(s): PROBNP in the last 8760 hours. HbA1C: No results for input(s): HGBA1C in the last 72 hours. CBG: No results for input(s): GLUCAP in the last 168 hours. Lipid Profile: No results for input(s): CHOL, HDL, LDLCALC, TRIG, CHOLHDL, LDLDIRECT in the last 72 hours. Thyroid Function Tests: Recent Labs    02/14/20 1821  TSH 4.454   Anemia Panel: No results for input(s): VITAMINB12, FOLATE, FERRITIN, TIBC, IRON, RETICCTPCT in the last 72 hours. Urine analysis:    Component Value Date/Time   COLORURINE YELLOW 02/03/2020 2038   APPEARANCEUR CLOUDY (A) 02/03/2020 2038   LABSPEC 1.017 02/03/2020 2038   PHURINE 5.0 02/03/2020 2038   GLUCOSEU NEGATIVE 02/03/2020 2038   HGBUR MODERATE (A) 02/03/2020 2038   BILIRUBINUR NEGATIVE 02/03/2020 2038   KETONESUR NEGATIVE 02/03/2020 2038  PROTEINUR 100 (A) 02/03/2020 2038   UROBILINOGEN 0.2 04/10/2008 1519   NITRITE POSITIVE (A) 02/03/2020 2038   LEUKOCYTESUR LARGE (A) 02/03/2020 2038   Sepsis Labs: @LABRCNTIP (procalcitonin:4,lacticidven:4) ) Recent Results (from the past 240 hour(s))  Resp Panel by RT-PCR (Flu A&B, Covid) Nasopharyngeal Swab     Status: None   Collection Time: 02/09/20  9:11 AM   Specimen: Nasopharyngeal Swab; Nasopharyngeal(NP) swabs in vial transport medium  Result Value Ref Range Status   SARS Coronavirus 2 by RT PCR NEGATIVE NEGATIVE Final    Comment: (NOTE) SARS-CoV-2 target nucleic acids are NOT DETECTED.  The SARS-CoV-2 RNA is generally detectable in upper respiratory specimens during the acute phase of infection. The lowest concentration of SARS-CoV-2 viral copies this assay can detect is 138 copies/mL. A negative result does not preclude SARS-Cov-2 infection and should not be used as the sole basis for treatment or other patient management decisions. A negative result may occur with  improper specimen collection/handling, submission of specimen other than nasopharyngeal swab,  presence of viral mutation(s) within the areas targeted by this assay, and inadequate number of viral copies(<138 copies/mL). A negative result must be combined with clinical observations, patient history, and epidemiological information. The expected result is Negative.  Fact Sheet for Patients:  BloggerCourse.comhttps://www.fda.gov/media/152166/download  Fact Sheet for Healthcare Providers:  SeriousBroker.ithttps://www.fda.gov/media/152162/download  This test is no t yet approved or cleared by the [place] FDA and  has been authorized for detection and/or diagnosis of SARS-CoV-2 by FDA under an Emergency Use Authorization (EUA). This EUA will remain  in effect (meaning this test can be used) for the duration of the COVID-19 declaration under Section 564(b)(1) of the Act, 21 U.S.C.section 360bbb-3(b)(1), unless the authorization is terminated  or revoked sooner.       Influenza A by PCR NEGATIVE NEGATIVE Final   Influenza B by PCR NEGATIVE NEGATIVE Final    Comment: (NOTE) The Xpert Xpress SARS-CoV-2/FLU/RSV plus assay is intended as an aid in the diagnosis of influenza from Nasopharyngeal swab specimens and should not be used as a sole basis for treatment. Nasal washings and aspirates are unacceptable for Xpert Xpress SARS-CoV-2/FLU/RSV testing.  Fact Sheet for Patients: BloggerCourse.comhttps://www.fda.gov/media/152166/download  Fact Sheet for Healthcare Providers: SeriousBroker.ithttps://www.fda.gov/media/152162/download  This test is not yet approved or cleared by the [place] FDA and has been authorized for detection and/or diagnosis of SARS-CoV-2 by FDA under an Emergency Use Authorization (EUA). This EUA will remain in effect (meaning this test can be used) for the duration of the COVID-19 declaration under Section 564(b)(1) of the Act, 21 U.S.C. section 360bbb-3(b)(1), unless the authorization is terminated or revoked.  Performed at [facility], [address] [place], [place] [address]   Resp Panel by  RT-PCR (Flu A&B, Covid) Nasopharyngeal Swab     Status: None   Collection Time: 02/14/20  4:22 AM   Specimen: Nasopharyngeal Swab; Nasopharyngeal(NP) swabs in vial transport medium  Result Value Ref Range Status   SARS Coronavirus 2 by RT PCR NEGATIVE NEGATIVE Final    Comment: (NOTE) SARS-CoV-2 target nucleic acids are NOT DETECTED.  The SARS-CoV-2 RNA is generally detectable in upper respiratory specimens during the acute phase of infection. The lowest concentration of SARS-CoV-2 viral copies this assay can detect is 138 copies/mL. A negative result does not preclude SARS-Cov-2 infection and should not be used as the sole basis for treatment or other patient management decisions. A negative result may occur with  improper specimen collection/handling, submission of specimen other than nasopharyngeal  swab, presence of viral mutation(s) within the areas targeted by this assay, and inadequate number of viral copies(<138 copies/mL). A negative result must be combined with clinical observations, patient history, and epidemiological information. The expected result is Negative.  Fact Sheet for Patients:  BloggerCourse.com  Fact Sheet for Healthcare Providers:  SeriousBroker.it  This test is no t yet approved or cleared by the [place] FDA and  has been authorized for detection and/or diagnosis of SARS-CoV-2 by FDA under an Emergency Use Authorization (EUA). This EUA will remain  in effect (meaning this test can be used) for the duration of the COVID-19 declaration under Section 564(b)(1) of the Act, 21 U.S.C.section 360bbb-3(b)(1), unless the authorization is terminated  or revoked sooner.       Influenza A by PCR NEGATIVE NEGATIVE Final   Influenza B by PCR NEGATIVE NEGATIVE Final    Comment: (NOTE) The Xpert Xpress SARS-CoV-2/FLU/RSV plus assay is intended as an aid in the diagnosis of influenza from Nasopharyngeal swab  specimens and should not be used as a sole basis for treatment. Nasal washings and aspirates are unacceptable for Xpert Xpress SARS-CoV-2/FLU/RSV testing.  Fact Sheet for Patients: BloggerCourse.com  Fact Sheet for Healthcare Providers: SeriousBroker.it  This test is not yet approved or cleared by the [place] FDA and has been authorized for detection and/or diagnosis of SARS-CoV-2 by FDA under an Emergency Use Authorization (EUA). This EUA will remain in effect (meaning this test can be used) for the duration of the COVID-19 declaration under Section 564(b)(1) of the Act, 21 U.S.C. section 360bbb-3(b)(1), unless the authorization is terminated or revoked.  Performed at [facility], [address], [place], [place] [address]   Culture, blood (routine x 2)     Status: None (Preliminary result)   Collection Time: 02/14/20 11:03 AM   Specimen: BLOOD RIGHT ARM  Result Value Ref Range Status   Specimen Description BLOOD RIGHT ARM  Final   Special Requests   Final    BOTTLES DRAWN AEROBIC AND ANAEROBIC Blood Culture adequate volume   Culture   Final    NO GROWTH 2 DAYS Performed at [facility], [address], [place], [place] [address]    Report Status PENDING  Incomplete  Culture, blood (routine x 2)     Status: Abnormal (Preliminary result)   Collection Time: 02/14/20  6:21 PM   Specimen: BLOOD  Result Value Ref Range Status   Specimen Description BLOOD BLOOD RIGHT FOREARM  Final   Special Requests   Final    BOTTLES DRAWN AEROBIC AND ANAEROBIC Blood Culture adequate volume   Culture  Setup Time   Final    GRAM NEGATIVE RODS ANAEROBIC BOTTLE ONLY Organism ID to follow CRITICAL RESULT CALLED TO, READ BACK BY AND VERIFIED WITH: Norva Riffle PharmD 10:25 02/16/20 ([email or web address])    Culture (A)  Final    ESCHERICHIA COLI SUSCEPTIBILITIES TO FOLLOW Performed at [name], [address], [place],  [place] [address]    Report Status PENDING  Incomplete  Blood Culture ID Panel (Reflexed)     Status: Abnormal   Collection Time: 02/14/20  6:21 PM  Result Value Ref Range Status   Enterococcus faecalis NOT DETECTED NOT DETECTED Final   Enterococcus Faecium NOT DETECTED NOT DETECTED Final   Listeria monocytogenes NOT DETECTED NOT DETECTED Final   Staphylococcus species NOT DETECTED NOT DETECTED Final   Staphylococcus aureus (BCID) NOT DETECTED NOT DETECTED Final   Staphylococcus epidermidis NOT DETECTED  NOT DETECTED Final   Staphylococcus lugdunensis NOT DETECTED NOT DETECTED Final   Streptococcus species NOT DETECTED NOT DETECTED Final   Streptococcus agalactiae NOT DETECTED NOT DETECTED Final   Streptococcus pneumoniae NOT DETECTED NOT DETECTED Final   Streptococcus pyogenes NOT DETECTED NOT DETECTED Final   A.calcoaceticus-baumannii NOT DETECTED NOT DETECTED Final   Bacteroides fragilis NOT DETECTED NOT DETECTED Final   Enterobacterales DETECTED (A) NOT DETECTED Final    Comment: Enterobacterales represent a large order of gram negative bacteria, not a single organism. CRITICAL RESULT CALLED TO, READ BACK BY AND VERIFIED WITH: Norva Riffle PharmD 10:25 02/16/20 ([email or web address])    Enterobacter cloacae complex NOT DETECTED NOT DETECTED Final   Escherichia coli DETECTED (A) NOT DETECTED Final    Comment: CRITICAL RESULT CALLED TO, READ BACK BY AND VERIFIED WITH: Norva Riffle PharmD 10:25 02/16/20 ([email or web address])    Klebsiella aerogenes NOT DETECTED NOT DETECTED Final   Klebsiella oxytoca NOT DETECTED NOT DETECTED Final   Klebsiella pneumoniae NOT DETECTED NOT DETECTED Final   Proteus species NOT DETECTED NOT DETECTED Final   Salmonella species NOT DETECTED NOT DETECTED Final   Serratia marcescens NOT DETECTED NOT DETECTED Final   Haemophilus influenzae NOT DETECTED NOT DETECTED Final   Neisseria meningitidis NOT DETECTED NOT DETECTED Final   Pseudomonas aeruginosa NOT DETECTED NOT DETECTED Final    Stenotrophomonas maltophilia NOT DETECTED NOT DETECTED Final   Candida albicans NOT DETECTED NOT DETECTED Final   Candida auris NOT DETECTED NOT DETECTED Final   Candida glabrata NOT DETECTED NOT DETECTED Final   Candida krusei NOT DETECTED NOT DETECTED Final   Candida parapsilosis NOT DETECTED NOT DETECTED Final   Candida tropicalis NOT DETECTED NOT DETECTED Final   Cryptococcus neoformans/gattii NOT DETECTED NOT DETECTED Final   CTX-M ESBL DETECTED (A) NOT DETECTED Final    Comment: CRITICAL RESULT CALLED TO, READ BACK BY AND VERIFIED WITH: Norva Riffle PharmD 10:25 02/16/20 ([email or web address]) (NOTE) Extended spectrum beta-lactamase detected. Recommend a carbapenem as initial therapy.      Carbapenem resistance IMP NOT DETECTED NOT DETECTED Final   Carbapenem resistance KPC NOT DETECTED NOT DETECTED Final   Carbapenem resistance NDM NOT DETECTED NOT DETECTED Final   Carbapenem resist OXA 48 LIKE NOT DETECTED NOT DETECTED Final   Carbapenem resistance VIM NOT DETECTED NOT DETECTED Final    Comment: Performed at [facility], [address], [place], [place] [address]         Radiology Studies: No results found.    Scheduled Meds: . aspirin  81 mg Oral Daily  . atenolol  25 mg Oral Daily  . atorvastatin  20 mg Oral QHS  . clopidogrel  75 mg Oral Daily  . docusate sodium  100 mg Oral BID  . enoxaparin (LOVENOX) injection  30 mg Subcutaneous Q24H  . loratadine  10 mg Oral Daily  . melatonin  6 mg Oral QHS  . multivitamin with minerals  1 tablet Oral Daily  . Muscle Rub  1 application Topical BID  . polyvinyl alcohol  1 drop Both Eyes TID  . pramipexole  0.125 mg Oral QHS  . sodium chloride flush  3 mL Intravenous Q12H   Continuous Infusions: . sodium chloride    . ertapenem 500 mg (02/17/20 1200)     LOS: 3 days      Calvert Cantor, MD [facility] Pager: [email or web address] 02/17/2020, 2:09 PM

## 2020-02-17 NOTE — Progress Notes (Signed)
[place] 1O10 [facility]) Hospitalized hospice patient visit.   [name] is new hospice patient admitted to [facility] hospice services yesterday with a hospice diagnosis of hypertensive heart disease. Patient became SOB, hypoxic, sats 83% with bradycardia, [name] activated EMS. Patient was transported to [facility] ED. Patient was admitted to [facility] on 02/14/2020 with a diagnosis of Acute respiratory failure with hypoxia and Acute pulmonary edema.  Per Dr. Kirt Boys with [facility] this is a related hospital admission.    Visited patient at bedside. No visitors present. Patient resting, awoke to name being called, denies pain or discomfort.  Respirations even and unlabored.  Report exchanged with bedside RN who reports patient takes liquids well but is refusing food.  Good selection of liquids noted at bedside table.  Patient declined anything during this visit.  No new complaints today, awaiting completions of IV antibiotics.    Still Appropriate for GIP?  Due to daily docse of IV antibiotics and PIV PRN meds to manage nausea this patient remains appropriate for inpatient care.  VS: 99.5 oral, 145/62, 91 HR, 21 RR, 91% 2LNC. I/O: 380/200 Abnormal Labs:   BUN 30, Creatinine 1.32, GFR 38 IV:   ertapenem (INVANZ) 500 mg PIV daily;   0.9 NS 250 mL bolus PRN x 0 doses; 4mg  Zofran PIV x 1 dose   Problem list: . Acute on chronic diastolic CHF (congestive heart failure) with acute respiratory failure . Recent sepsis with Klebsiella bacteremia and UTI and obstructing nephrolithiasis . CKD stage 3b . Poor oral intake  Discharge planning: Ongoing. Plan is to discharge back to once antibiotic treatment complete.  Family Contact: Called [place] to update but was unable to reach IDG: Updated GOC: DNR     Should ambulance transport be needed please [facility] as they contract this service for all of our active hospice patients.   [place], BSN, RN [facility] Liaison    [phone]

## 2020-02-18 LAB — CULTURE, BLOOD (ROUTINE X 2): Special Requests: ADEQUATE

## 2020-02-18 LAB — CBC WITH DIFFERENTIAL/PLATELET
Abs Immature Granulocytes: 0.12 10*3/uL — ABNORMAL HIGH (ref 0.00–0.07)
Basophils Absolute: 0.1 10*3/uL (ref 0.0–0.1)
Basophils Relative: 0 %
Eosinophils Absolute: 0.1 10*3/uL (ref 0.0–0.5)
Eosinophils Relative: 1 %
HCT: 35.9 % — ABNORMAL LOW (ref 36.0–46.0)
Hemoglobin: 11 g/dL — ABNORMAL LOW (ref 12.0–15.0)
Immature Granulocytes: 1 %
Lymphocytes Relative: 7 %
Lymphs Abs: 1.2 10*3/uL (ref 0.7–4.0)
MCH: 28.9 pg (ref 26.0–34.0)
MCHC: 30.6 g/dL (ref 30.0–36.0)
MCV: 94.2 fL (ref 80.0–100.0)
Monocytes Absolute: 0.9 10*3/uL (ref 0.1–1.0)
Monocytes Relative: 5 %
Neutro Abs: 14.6 10*3/uL — ABNORMAL HIGH (ref 1.7–7.7)
Neutrophils Relative %: 86 %
Platelets: 337 10*3/uL (ref 150–400)
RBC: 3.81 MIL/uL — ABNORMAL LOW (ref 3.87–5.11)
RDW: 15.9 % — ABNORMAL HIGH (ref 11.5–15.5)
WBC: 17 10*3/uL — ABNORMAL HIGH (ref 4.0–10.5)
nRBC: 0 % (ref 0.0–0.2)

## 2020-02-18 LAB — BASIC METABOLIC PANEL
Anion gap: 11 (ref 5–15)
BUN: 38 mg/dL — ABNORMAL HIGH (ref 8–23)
CO2: 28 mmol/L (ref 22–32)
Calcium: 8.7 mg/dL — ABNORMAL LOW (ref 8.9–10.3)
Chloride: 99 mmol/L (ref 98–111)
Creatinine, Ser: 1.55 mg/dL — ABNORMAL HIGH (ref 0.44–1.00)
GFR, Estimated: 31 mL/min — ABNORMAL LOW (ref >60)
Glucose, Bld: 106 mg/dL — ABNORMAL HIGH (ref 70–99)
Potassium: 3.9 mmol/L (ref 3.5–5.1)
Sodium: 138 mmol/L (ref 135–145)

## 2020-02-18 LAB — LACTIC ACID, PLASMA: Lactic Acid, Venous: 1.3 mmol/L (ref 0.5–1.9)

## 2020-02-18 LAB — PROCALCITONIN: Procalcitonin: 0.37 ng/mL

## 2020-02-18 MED ORDER — ACETAMINOPHEN 325 MG PO TABS
650.0000 mg | ORAL_TABLET | Freq: Four times a day (QID) | ORAL | Status: DC | PRN
  Administered 2020-02-18: 650 mg via ORAL
  Filled 2020-02-18: qty 2

## 2020-02-18 NOTE — Progress Notes (Signed)
PROGRESS NOTE    [name]   ONG:[id number]  DOB: 11/26/1924  DOA: 02/14/2020 PCP: Jarrett Soho, PA-C    Brief Narrative:   Tamara Woodsis a 86 y.o.femalesent from [place] with medical history significant ofhypertension, peripheral vascular disease, carotid artery stenosiss/pendarterectomy, who is bedbound and presents with complaints of trouble breathing. She was admitted to the hospital from 02/03/20 through 02/09/20 for sepsis related to Klebsiella UTI complicated by an obstructing renal calculus  and bacteremia for which she received a nephrostomy tube. She also was found to have an NSTEMI during this admission. Palliative care discussions were started and hospice was initiated at the facility in which she was residing.   Enroute with EMS patient's O2 saturations were reportedly 84% on room air and improved to 100% on 4 L of nasal cannula oxygen.  CXR > Findings may reflect CHF/volume overload with cardiomegaly, interstitial and alveolar edema and small bilateral effusions. Background of chronic coarsened interstitial and bronchitic features.   Subjective: No complaints.    Assessment & Plan:   Principal Problem:   Acute on chronic diastolic CHF (congestive heart failure) with acute respiratory failure COVID and Influenza negative - treated with IV Lasix x 2 days and has improved  - per ECHO from 02/04/20, she has a normal EF (55-60%) and diastolic function was not able to be determined. The ECHO further mentions severe mitral calcification with mod-severe MR, mod aortic valve stenosis and mild AI - from the information I have received, she has recently been transitioned to hospice and is now receiving outpatient hospice services. In this case, I do not feel we should be very aggressive and transfer back to the hospital in case of another exacerbation. To prevent another exacerbation, we can use Lasix 40 mg daily PRN based on her symptoms at [facility]. From what I have noted, her oral intake is not very good and daily Lasix would result in rapid dehydration.  Active Problems:  Recent sepsis with Klebsiella bacteremia and UTI and obstructing nephrolithiasis - she still has a percutaneous nephrostomy tube -   WBC count remains slightly elevated and one set of blood culture growing gram neg rods-per BCID this is an ESBL producing e coli- I have spoken with her caretaker (pastor), [name] who would like to initiate IV antibiotics to treat this infection - cont Ertapenem    CKD stage 3b - Cr at baseline which appears to be 1.1-1.3  Poor oral intake - does not eat much solid food but does drink liquids well    Time spent in minutes: 35 DVT prophylaxis: enoxaparin (LOVENOX) injection 30 mg Start: 02/14/20 0830  Code Status: DNR Family Communication:  Disposition Plan:  Status is: Inpatient  Remains inpatient appropriate because:IV treatments appropriate due to intensity of illness or inability to take PO   Dispo: The patient is from: SNF              Anticipated d/c is to: SNF              Anticipated d/c date is: 1 day              Patient currently is not medically stable to d/c.   Consultants:   none Procedures:   none Antimicrobials:  Anti-infectives (From admission, onward)   Start     Dose/Rate Route Frequency Ordered Stop   02/16/20 1145  ertapenem (INVANZ) 500 mg in sodium chloride 0.9 % 50 mL IVPB  500 mg 100 mL/hr over 30 Minutes Intravenous Every 24 hours 02/16/20 1051     02/16/20 1130  ertapenem [facility]) injection 500 mg  Status:  Discontinued        500 mg Intramuscular Every 24 hours 02/16/20 1035 02/16/20 1051       Objective: Vitals:   02/17/20 2156 02/18/20 0134 02/18/20 0532 02/18/20 1212  BP: (!) 109/49  (!) 125/55 (!) 121/59  Pulse: 68  69 (!) 42  Resp: 20  20 16   Temp: 97.7 F (36.5 C)  97.6 F (36.4 C) (!) 97.4 F (36.3 C)  TempSrc: Oral  Oral Oral  SpO2: 94%  94% (!)  80%  Weight:  67.1 kg    Height:        Intake/Output Summary (Last 24 hours) at 02/18/2020 1246 Last data filed at 02/18/2020 1000 Gross per 24 hour  Intake 200 ml  Output 300 ml  Net -100 ml   Filed Weights   02/15/20 0150 02/17/20 0344 02/18/20 0134  Weight: 57.8 kg 64.9 kg 67.1 kg    Examination: General exam: Appears comfortable  HEENT: PERRLA, oral mucosa moist, no sclera icterus or thrush Respiratory system: Clear to auscultation. Respiratory effort normal. Cardiovascular system: S1 & S2 heard,  No murmurs  Gastrointestinal system: Abdomen soft, non-tender, nondistended. Normal bowel sounds   Central nervous system: Alert and oriented. No focal neurological deficits. Extremities: No cyanosis, clubbing or edema Skin: No rashes or ulcers Psychiatry:  Mood & affect appropriate.     Data Reviewed: I have personally reviewed following labs and imaging studies  CBC: Recent Labs  Lab 02/14/20 0422 02/15/20 0103  WBC 15.4* 13.4*  NEUTROABS 12.1* 10.0*  HGB 11.5* 10.9*  HCT 37.4 34.1*  MCV 94.7 92.4  PLT 311 294   Basic Metabolic Panel: Recent Labs  Lab 02/14/20 0422 02/15/20 0103 02/16/20 0257 02/17/20 0143 02/18/20 0232  NA 141 141 140 137 138  K 3.8 3.9 3.6 4.9 3.9  CL 105 103 101 98 99  CO2 25 25 28 22 28   GLUCOSE 113* 81 85 95 106*  BUN 27* 25* 28* 30* 38*  CREATININE 1.23* 1.11* 1.25* 1.32* 1.55*  CALCIUM 9.1 8.5* 8.6* 8.9 8.7*   GFR: Estimated Creatinine Clearance: 19.4 mL/min (A) (by C-G formula based on SCr of 1.55 mg/dL (H)). Liver Function Tests: No results for input(s): AST, ALT, ALKPHOS, BILITOT, PROT, ALBUMIN in the last 168 hours. No results for input(s): LIPASE, AMYLASE in the last 168 hours. No results for input(s): AMMONIA in the last 168 hours. Coagulation Profile: No results for input(s): INR, PROTIME in the last 168 hours. Cardiac Enzymes: No results for input(s): CKTOTAL, CKMB, CKMBINDEX, TROPONINI in the last 168 hours. BNP  (last 3 results) No results for input(s): PROBNP in the last 8760 hours. HbA1C: No results for input(s): HGBA1C in the last 72 hours. CBG: No results for input(s): GLUCAP in the last 168 hours. Lipid Profile: No results for input(s): CHOL, HDL, LDLCALC, TRIG, CHOLHDL, LDLDIRECT in the last 72 hours. Thyroid Function Tests: No results for input(s): TSH, T4TOTAL, FREET4, T3FREE, THYROIDAB in the last 72 hours. Anemia Panel: No results for input(s): VITAMINB12, FOLATE, FERRITIN, TIBC, IRON, RETICCTPCT in the last 72 hours. Urine analysis:    Component Value Date/Time   COLORURINE YELLOW 02/03/2020 2038   APPEARANCEUR CLOUDY (A) 02/03/2020 2038   LABSPEC 1.017 02/03/2020 2038   PHURINE 5.0 02/03/2020 2038   GLUCOSEU NEGATIVE 02/03/2020 2038   HGBUR MODERATE (A) 02/03/2020  2038   BILIRUBINUR NEGATIVE 02/03/2020 2038   KETONESUR NEGATIVE 02/03/2020 2038   PROTEINUR 100 (A) 02/03/2020 2038   UROBILINOGEN 0.2 04/10/2008 1519   NITRITE POSITIVE (A) 02/03/2020 2038   LEUKOCYTESUR LARGE (A) 02/03/2020 2038   Sepsis Labs: @LABRCNTIP (procalcitonin:4,lacticidven:4) ) Recent Results (from the past 240 hour(s))  Resp Panel by RT-PCR (Flu A&B, Covid) Nasopharyngeal Swab     Status: None   Collection Time: 02/09/20  9:11 AM   Specimen: Nasopharyngeal Swab; Nasopharyngeal(NP) swabs in vial transport medium  Result Value Ref Range Status   SARS Coronavirus 2 by RT PCR NEGATIVE NEGATIVE Final    Comment: (NOTE) SARS-CoV-2 target nucleic acids are NOT DETECTED.  The SARS-CoV-2 RNA is generally detectable in upper respiratory specimens during the acute phase of infection. The lowest concentration of SARS-CoV-2 viral copies this assay can detect is 138 copies/mL. A negative result does not preclude SARS-Cov-2 infection and should not be used as the sole basis for treatment or other patient management decisions. A negative result may occur with  improper specimen collection/handling, submission  of specimen other than nasopharyngeal swab, presence of viral mutation(s) within the areas targeted by this assay, and inadequate number of viral copies(<138 copies/mL). A negative result must be combined with clinical observations, patient history, and epidemiological information. The expected result is Negative.  Fact Sheet for Patients:  BloggerCourse.comhttps://www.fda.gov/media/152166/download  Fact Sheet for Healthcare Providers:  SeriousBroker.ithttps://www.fda.gov/media/152162/download  This test is no t yet approved or cleared by the [place] FDA and  has been authorized for detection and/or diagnosis of SARS-CoV-2 by FDA under an Emergency Use Authorization (EUA). This EUA will remain  in effect (meaning this test can be used) for the duration of the COVID-19 declaration under Section 564(b)(1) of the Act, 21 U.S.C.section 360bbb-3(b)(1), unless the authorization is terminated  or revoked sooner.       Influenza A by PCR NEGATIVE NEGATIVE Final   Influenza B by PCR NEGATIVE NEGATIVE Final    Comment: (NOTE) The Xpert Xpress SARS-CoV-2/FLU/RSV plus assay is intended as an aid in the diagnosis of influenza from Nasopharyngeal swab specimens and should not be used as a sole basis for treatment. Nasal washings and aspirates are unacceptable for Xpert Xpress SARS-CoV-2/FLU/RSV testing.  Fact Sheet for Patients: BloggerCourse.comhttps://www.fda.gov/media/152166/download  Fact Sheet for Healthcare Providers: SeriousBroker.ithttps://www.fda.gov/media/152162/download  This test is not yet approved or cleared by the [place] FDA and has been authorized for detection and/or diagnosis of SARS-CoV-2 by FDA under an Emergency Use Authorization (EUA). This EUA will remain in effect (meaning this test can be used) for the duration of the COVID-19 declaration under Section 564(b)(1) of the Act, 21 U.S.C. section 360bbb-3(b)(1), unless the authorization is terminated or revoked.  Performed at [facility], [address]  [place], [place] [address]   Resp Panel by RT-PCR (Flu A&B, Covid) Nasopharyngeal Swab     Status: None   Collection Time: 02/14/20  4:22 AM   Specimen: Nasopharyngeal Swab; Nasopharyngeal(NP) swabs in vial transport medium  Result Value Ref Range Status   SARS Coronavirus 2 by RT PCR NEGATIVE NEGATIVE Final    Comment: (NOTE) SARS-CoV-2 target nucleic acids are NOT DETECTED.  The SARS-CoV-2 RNA is generally detectable in upper respiratory specimens during the acute phase of infection. The lowest concentration of SARS-CoV-2 viral copies this assay can detect is 138 copies/mL. A negative result does not preclude SARS-Cov-2 infection and should not be used as the sole basis for treatment or other patient management decisions. A  negative result may occur with  improper specimen collection/handling, submission of specimen other than nasopharyngeal swab, presence of viral mutation(s) within the areas targeted by this assay, and inadequate number of viral copies(<138 copies/mL). A negative result must be combined with clinical observations, patient history, and epidemiological information. The expected result is Negative.  Fact Sheet for Patients:  BloggerCourse.com  Fact Sheet for Healthcare Providers:  SeriousBroker.it  This test is no t yet approved or cleared by the [place] FDA and  has been authorized for detection and/or diagnosis of SARS-CoV-2 by FDA under an Emergency Use Authorization (EUA). This EUA will remain  in effect (meaning this test can be used) for the duration of the COVID-19 declaration under Section 564(b)(1) of the Act, 21 U.S.C.section 360bbb-3(b)(1), unless the authorization is terminated  or revoked sooner.       Influenza A by PCR NEGATIVE NEGATIVE Final   Influenza B by PCR NEGATIVE NEGATIVE Final    Comment: (NOTE) The Xpert Xpress SARS-CoV-2/FLU/RSV plus assay is intended as an aid in the diagnosis of  influenza from Nasopharyngeal swab specimens and should not be used as a sole basis for treatment. Nasal washings and aspirates are unacceptable for Xpert Xpress SARS-CoV-2/FLU/RSV testing.  Fact Sheet for Patients: BloggerCourse.com  Fact Sheet for Healthcare Providers: SeriousBroker.it  This test is not yet approved or cleared by the [place] FDA and has been authorized for detection and/or diagnosis of SARS-CoV-2 by FDA under an Emergency Use Authorization (EUA). This EUA will remain in effect (meaning this test can be used) for the duration of the COVID-19 declaration under Section 564(b)(1) of the Act, 21 U.S.C. section 360bbb-3(b)(1), unless the authorization is terminated or revoked.  Performed at [facility], [address] [place], [place] [address]   Culture, blood (routine x 2)     Status: None (Preliminary result)   Collection Time: 02/14/20 11:03 AM   Specimen: BLOOD RIGHT ARM  Result Value Ref Range Status   Specimen Description BLOOD RIGHT ARM  Final   Special Requests   Final    BOTTLES DRAWN AEROBIC AND ANAEROBIC Blood Culture adequate volume   Culture   Final    NO GROWTH 4 DAYS Performed at [facility], [address], [place], [place] [address]    Report Status PENDING  Incomplete  Culture, blood (routine x 2)     Status: Abnormal   Collection Time: 02/14/20  6:21 PM   Specimen: BLOOD  Result Value Ref Range Status   Specimen Description BLOOD BLOOD RIGHT FOREARM  Final   Special Requests   Final    BOTTLES DRAWN AEROBIC AND ANAEROBIC Blood Culture adequate volume   Culture  Setup Time   Final    GRAM NEGATIVE RODS ANAEROBIC BOTTLE ONLY Organism ID to follow CRITICAL RESULT CALLED TO, READ BACK BY AND VERIFIED WITH: Norva Riffle PharmD 10:25 02/16/20 ([email or web address]) Performed at [facility], [address], [place], [place] [address]    Culture (A)  Final    ESCHERICHIA  COLI Confirmed Extended Spectrum Beta-Lactamase Producer (ESBL).  In bloodstream infections from ESBL organisms, carbapenems are preferred over piperacillin/tazobactam. They are shown to have a lower risk of mortality.    Report Status 02/18/2020 FINAL  Final   Organism ID, Bacteria ESCHERICHIA COLI  Final      Susceptibility   Escherichia coli - MIC*    AMPICILLIN >=32 RESISTANT Resistant     CEFAZOLIN >=64 RESISTANT Resistant  CEFEPIME >=32 RESISTANT Resistant     CEFTAZIDIME RESISTANT Resistant     CEFTRIAXONE >=64 RESISTANT Resistant     CIPROFLOXACIN >=4 RESISTANT Resistant     GENTAMICIN >=16 RESISTANT Resistant     IMIPENEM <=0.25 SENSITIVE Sensitive     TRIMETH/SULFA <=20 SENSITIVE Sensitive     AMPICILLIN/SULBACTAM >=32 RESISTANT Resistant     PIP/TAZO 8 SENSITIVE Sensitive     * ESCHERICHIA COLI  Blood Culture ID Panel (Reflexed)     Status: Abnormal   Collection Time: 02/14/20  6:21 PM  Result Value Ref Range Status   Enterococcus faecalis NOT DETECTED NOT DETECTED Final   Enterococcus Faecium NOT DETECTED NOT DETECTED Final   Listeria monocytogenes NOT DETECTED NOT DETECTED Final   Staphylococcus species NOT DETECTED NOT DETECTED Final   Staphylococcus aureus (BCID) NOT DETECTED NOT DETECTED Final   Staphylococcus epidermidis NOT DETECTED NOT DETECTED Final   Staphylococcus lugdunensis NOT DETECTED NOT DETECTED Final   Streptococcus species NOT DETECTED NOT DETECTED Final   Streptococcus agalactiae NOT DETECTED NOT DETECTED Final   Streptococcus pneumoniae NOT DETECTED NOT DETECTED Final   Streptococcus pyogenes NOT DETECTED NOT DETECTED Final   A.calcoaceticus-baumannii NOT DETECTED NOT DETECTED Final   Bacteroides fragilis NOT DETECTED NOT DETECTED Final   Enterobacterales DETECTED (A) NOT DETECTED Final    Comment: Enterobacterales represent a large order of gram negative bacteria, not a single organism. CRITICAL RESULT CALLED TO, READ BACK BY AND VERIFIED  WITH: Norva Riffle PharmD 10:25 02/16/20 ([email or web address])    Enterobacter cloacae complex NOT DETECTED NOT DETECTED Final   Escherichia coli DETECTED (A) NOT DETECTED Final    Comment: CRITICAL RESULT CALLED TO, READ BACK BY AND VERIFIED WITH: Norva Riffle PharmD 10:25 02/16/20 ([email or web address])    Klebsiella aerogenes NOT DETECTED NOT DETECTED Final   Klebsiella oxytoca NOT DETECTED NOT DETECTED Final   Klebsiella pneumoniae NOT DETECTED NOT DETECTED Final   Proteus species NOT DETECTED NOT DETECTED Final   Salmonella species NOT DETECTED NOT DETECTED Final   Serratia marcescens NOT DETECTED NOT DETECTED Final   Haemophilus influenzae NOT DETECTED NOT DETECTED Final   Neisseria meningitidis NOT DETECTED NOT DETECTED Final   Pseudomonas aeruginosa NOT DETECTED NOT DETECTED Final   Stenotrophomonas maltophilia NOT DETECTED NOT DETECTED Final   Candida albicans NOT DETECTED NOT DETECTED Final   Candida auris NOT DETECTED NOT DETECTED Final   Candida glabrata NOT DETECTED NOT DETECTED Final   Candida krusei NOT DETECTED NOT DETECTED Final   Candida parapsilosis NOT DETECTED NOT DETECTED Final   Candida tropicalis NOT DETECTED NOT DETECTED Final   Cryptococcus neoformans/gattii NOT DETECTED NOT DETECTED Final   CTX-M ESBL DETECTED (A) NOT DETECTED Final    Comment: CRITICAL RESULT CALLED TO, READ BACK BY AND VERIFIED WITH: Norva Riffle PharmD 10:25 02/16/20 ([email or web address]) (NOTE) Extended spectrum beta-lactamase detected. Recommend a carbapenem as initial therapy.      Carbapenem resistance IMP NOT DETECTED NOT DETECTED Final   Carbapenem resistance KPC NOT DETECTED NOT DETECTED Final   Carbapenem resistance NDM NOT DETECTED NOT DETECTED Final   Carbapenem resist OXA 48 LIKE NOT DETECTED NOT DETECTED Final   Carbapenem resistance VIM NOT DETECTED NOT DETECTED Final    Comment: Performed at [facility], [address], [place], [place] [address]         Radiology Studies: No results  found.    Scheduled Meds: . aspirin  81 mg Oral Daily  . atenolol  25 mg Oral Daily  .  atorvastatin  20 mg Oral QHS  . clopidogrel  75 mg Oral Daily  . docusate sodium  100 mg Oral BID  . enoxaparin (LOVENOX) injection  30 mg Subcutaneous Q24H  . loratadine  10 mg Oral Daily  . melatonin  6 mg Oral QHS  . multivitamin with minerals  1 tablet Oral Daily  . Muscle Rub  1 application Topical BID  . polyvinyl alcohol  1 drop Both Eyes TID  . pramipexole  0.125 mg Oral QHS  . sodium chloride flush  3 mL Intravenous Q12H   Continuous Infusions: . sodium chloride    . ertapenem 500 mg (02/18/20 1144)     LOS: 4 days      Calvert Cantor, MD [facility] Pager: [email or web address] 02/18/2020, 12:46 PM

## 2020-02-18 NOTE — Progress Notes (Signed)
[place] 6Y40 [facility]) Hospitalized hospice patient visit.  [name] is new hospice patient admitted to [facility] hospice services yesterday with a hospice diagnosis of hypertensive heart disease. Patient became SOB, hypoxic, sats 83% with bradycardia, [name] activated EMS. Patient was transported to [facility] ED. Patient was admitted to [facility] on 02/14/2020 with a diagnosis of Acute respiratory failure with hypoxia and Acute pulmonary edema. Per Dr. Kirt Boys with [facility] this is a related hospital admission.   Visited patient at bedside. No visiters present. Patient is awake and alert. She states she is ready to go home. Reports visits from church members. Updated her EC Kellly by phone.  VS: 97.4, 121/59, 42, 16, 94% RA I/O: 460/300 Abnormal Labs: Glucose: 106 (H) BUN: 38 (H) Creatinine: 1.55 (H) Calcium: 8.7 (L) GFR, Estimated: 31 (L)  Problem list:  Acute on chronic diastolic CHF (congestive heart failure) with acute respiratory failure  Recent sepsis with Klebsiella bacteremia and UTI and obstructing nephrolithiasis  CKD stage 3b  Poor oral intake Discharge planning: Ongoing. Plan is to discharge back to [place] once antibiotic treatment complete.  Family Contact: Called [name].  IDG: Updated GOC: DNR  Should ambulance transport be needed please [facility] as they contract this service for all of our active hospice patients.   A Please do not hesitate to call with questions.    Thank you,    Elsie Saas, RN, [facility]       [facility] Liaison (listed on [facility] under Hospice [facility])     [phone]

## 2020-02-18 NOTE — Progress Notes (Signed)
   02/18/20 1941  Assess: MEWS Score  Temp (!) 101.5 F (38.6 C)  BP 138/83  Pulse Rate 93  Resp 17  Assess: MEWS Score  MEWS Temp 2  MEWS Systolic 0  MEWS Pulse 0  MEWS RR 0  MEWS LOC 0  MEWS Score 2  MEWS Score Color Yellow  Assess: if the MEWS score is Yellow or Red  Were vital signs taken at a resting state? Yes  Focused Assessment No change from prior assessment  Early Detection of Sepsis Score *See Row Information* High  MEWS guidelines implemented *See Row Information* Yes  Treat  MEWS Interventions Escalated (See documentation below)  Pain Scale 0-10  Pain Score 0  Take Vital Signs  Increase Vital Sign Frequency  Yellow: Q 2hr X 2 then Q 4hr X 2, if remains yellow, continue Q 4hrs  Escalate  MEWS: Escalate Yellow: discuss with charge nurse/RN and consider discussing with provider and RRT  Notify: Charge Nurse/RN  Name of Charge Nurse/RN Notified Jequetta RN  Date Charge Nurse/RN Notified 02/18/20  Time Charge Nurse/RN Notified 1945  Notify: Provider  Provider Name/Title Zierle-Ghosh  Date Provider Notified 02/18/20  Time Provider Notified 2001  Notification Type Page  Notification Reason Change in status  Response See new orders  Date of Provider Response 02/18/20  Time of Provider Response 2015  Document  Patient Outcome Not stable and remains on department  Progress note created (see row info) Yes

## 2020-02-19 ENCOUNTER — Inpatient Hospital Stay (HOSPITAL_COMMUNITY)

## 2020-02-19 LAB — BASIC METABOLIC PANEL
Anion gap: 13 (ref 5–15)
BUN: 40 mg/dL — ABNORMAL HIGH (ref 8–23)
CO2: 27 mmol/L (ref 22–32)
Calcium: 8.7 mg/dL — ABNORMAL LOW (ref 8.9–10.3)
Chloride: 100 mmol/L (ref 98–111)
Creatinine, Ser: 1.69 mg/dL — ABNORMAL HIGH (ref 0.44–1.00)
GFR, Estimated: 28 mL/min — ABNORMAL LOW (ref >60)
Glucose, Bld: 104 mg/dL — ABNORMAL HIGH (ref 70–99)
Potassium: 4.5 mmol/L (ref 3.5–5.1)
Sodium: 140 mmol/L (ref 135–145)

## 2020-02-19 LAB — CULTURE, BLOOD (ROUTINE X 2)
Culture: NO GROWTH
Special Requests: ADEQUATE

## 2020-02-19 MED ORDER — SODIUM CHLORIDE 0.9 % IV SOLN: INTRAVENOUS | Status: DC

## 2020-02-19 MED ORDER — MORPHINE SULFATE (PF) 2 MG/ML IV SOLN
2.0000 mg | Freq: Once | INTRAVENOUS | Status: AC
  Administered 2020-02-20: 2 mg via INTRAVENOUS
  Filled 2020-02-19: qty 1

## 2020-02-19 NOTE — Progress Notes (Signed)
PROGRESS NOTE    [name]   ZOX:[id number]  DOB: 1924-11-08  DOA: 02/14/2020 PCP: Jarrett SohoWharton, Courtney, PA-C    Brief Narrative:   Tamara DandyMary Woodsis a 86 y.[place]ent [place] with medical history significant ofhypertension, peripheral vascular disease, carotid artery stenosiss/pendarterectomy, who is bedbound and presents with complaints of trouble breathing. She was admitted to the hospital from 02/03/20 through 02/09/20 for sepsis related to Klebsiella UTI complicated by an obstructing renal calculus  and bacteremia for which she received a nephrostomy tube. She also was found to have an NSTEMI during this admission. Palliative care discussions were started and hospice was initiated at the facility in which she was residing.   Enroute with EMS patient's O2 saturations were reportedly 84% on room air and improved to 100% on 4 L of nasal cannula oxygen.  CXR > Findings may reflect CHF/volume overload with cardiomegaly, interstitial and alveolar edema and small bilateral effusions. Background of chronic coarsened interstitial and bronchitic features.   Subjective: She is thirsty and is asking to get out of bed.     Assessment & Plan:   Principal Problem:   Acute on chronic diastolic CHF (congestive heart failure) with acute respiratory failure - COVID and Influenza negative - treated with IV Lasix x 2 days and has improved  - per ECHO from 02/04/20, she has a normal EF (55-60%) and diastolic function was not able to be determined. The ECHO further mentions severe mitral calcification with mod-severe MR, mod aortic valve stenosis and mild AI - from the information I have received, she has recently been transitioned to hospice and is now receiving outpatient hospice services. In this case, I do not feel we should be very aggressive and transfer back to the hospital in case of another exacerbation. To prevent another exacerbation, we can use Lasix 40 mg daily PRN  [facility]. From what I have noted, her oral intake is not very good and daily Lasix would result in rapid dehydration.  Active Problems:  Recent sepsis with Klebsiella bacteremia and UTI and obstructing nephrolithiasis - she still has a percutaneous nephrostomy tube -   WBC count remains slightly elevated and one set of blood culture growing gram neg rods-per BCID this is an ESBL producing e coli- I have spoken with her caretaker (pastor), Egbert GaribaldiKelly Groce who would like to initiate IV antibiotics to treat this infection - cont Ertapenem    CKD stage 3b - Cr at baseline which appears to be 1.1-1.3  Poor oral intake - does not eat much solid food but does drink liquids well    Time spent in minutes: 35 DVT prophylaxis: enoxaparin (LOVENOX) injection 30 mg Start: 02/14/20 0830  Code Status: DNR Family Communication:  Disposition Plan:  Status is: Inpatient  Remains inpatient appropriate because:IV treatments appropriate due to intensity of illness or inability to take PO   Dispo: The patient is from: SNF              Anticipated d/c is to: SNF              Anticipated d/c date is: 1 day              Patient currently is not medically stable to d/c.   Consultants:   none Procedures:   none Antimicrobials:  Anti-infectives (From admission, onward)   Start     Dose/Rate Route Frequency Ordered Stop   02/16/20 1145  ertapenem (INVANZ) 500 mg in sodium chloride  0.9 % 50 mL IVPB        500 mg 100 mL/hr over 30 Minutes Intravenous Every 24 hours 02/16/20 1051     02/16/20 1130  ertapenem [facility]) injection 500 mg  Status:  Discontinued        500 mg Intramuscular Every 24 hours 02/16/20 1035 02/16/20 1051       Objective: Vitals:   02/19/20 0443 02/19/20 0500 02/19/20 0835 02/19/20 1211  BP: (!) 115/57  121/69 (!) 105/44  Pulse: 68  72 65  Resp: 19  18 18   Temp: 98.4 F (36.9 C)   98.8 F (37.1 C)  TempSrc: Oral   Oral  SpO2: (!) 89%  96%  98%  Weight:  70.6 kg    Height:        Intake/Output Summary (Last 24 hours) at 02/19/2020 1239 Last data filed at 02/19/2020 02/21/2020 Gross per 24 hour  Intake 366 ml  Output 475 ml  Net -109 ml   Filed Weights   02/17/20 0344 02/18/20 0134 02/19/20 0500  Weight: 64.9 kg 67.1 kg 70.6 kg    Examination: General exam: Appears comfortable  HEENT: PERRLA, oral mucosa moist, no sclera icterus or thrush Respiratory system: Clear to auscultation. Respiratory effort normal. Cardiovascular system: S1 & S2 heard,  No murmurs  Gastrointestinal system: Abdomen soft, non-tender, nondistended. Normal bowel sounds   Central nervous system: Alert and oriented. No focal neurological deficits. Extremities: No cyanosis, clubbing or edema Skin: No rashes or ulcers Psychiatry:  Mood & affect appropriate.    Data Reviewed: I have personally reviewed following labs and imaging studies  CBC: Recent Labs  Lab 02/14/20 0422 02/15/20 0103 02/18/20 2056  WBC 15.4* 13.4* 17.0*  NEUTROABS 12.1* 10.0* 14.6*  HGB 11.5* 10.9* 11.0*  HCT 37.4 34.1* 35.9*  MCV 94.7 92.4 94.2  PLT 311 294 337   Basic Metabolic Panel: Recent Labs  Lab 02/15/20 0103 02/16/20 0257 02/17/20 0143 02/18/20 0232 02/19/20 0430  NA 141 140 137 138 140  K 3.9 3.6 4.9 3.9 4.5  CL 103 101 98 99 100  CO2 25 28 22 28 27   GLUCOSE 81 85 95 106* 104*  BUN 25* 28* 30* 38* 40*  CREATININE 1.11* 1.25* 1.32* 1.55* 1.69*  CALCIUM 8.5* 8.6* 8.9 8.7* 8.7*   GFR: Estimated Creatinine Clearance: 18.2 mL/min (A) (by C-G formula based on SCr of 1.69 mg/dL (H)). Liver Function Tests: No results for input(s): AST, ALT, ALKPHOS, BILITOT, PROT, ALBUMIN in the last 168 hours. No results for input(s): LIPASE, AMYLASE in the last 168 hours. No results for input(s): AMMONIA in the last 168 hours. Coagulation Profile: No results for input(s): INR, PROTIME in the last 168 hours. Cardiac Enzymes: No results for input(s): CKTOTAL, CKMB,  CKMBINDEX, TROPONINI in the last 168 hours. BNP (last 3 results) No results for input(s): PROBNP in the last 8760 hours. HbA1C: No results for input(s): HGBA1C in the last 72 hours. CBG: No results for input(s): GLUCAP in the last 168 hours. Lipid Profile: No results for input(s): CHOL, HDL, LDLCALC, TRIG, CHOLHDL, LDLDIRECT in the last 72 hours. Thyroid Function Tests: No results for input(s): TSH, T4TOTAL, FREET4, T3FREE, THYROIDAB in the last 72 hours. Anemia Panel: No results for input(s): VITAMINB12, FOLATE, FERRITIN, TIBC, IRON, RETICCTPCT in the last 72 hours. Urine analysis:    Component Value Date/Time   COLORURINE YELLOW 02/03/2020 2038   APPEARANCEUR CLOUDY (A) 02/03/2020 2038   LABSPEC 1.017 02/03/2020 2038   PHURINE 5.0 02/03/2020  2038   GLUCOSEU NEGATIVE 02/03/2020 2038   HGBUR MODERATE (A) 02/03/2020 2038   BILIRUBINUR NEGATIVE 02/03/2020 2038   KETONESUR NEGATIVE 02/03/2020 2038   PROTEINUR 100 (A) 02/03/2020 2038   UROBILINOGEN 0.2 04/10/2008 1519   NITRITE POSITIVE (A) 02/03/2020 2038   LEUKOCYTESUR LARGE (A) 02/03/2020 2038   Sepsis Labs: @LABRCNTIP (procalcitonin:4,lacticidven:4) ) Recent Results (from the past 240 hour(s))  Resp Panel by RT-PCR (Flu A&B, Covid) Nasopharyngeal Swab     Status: None   Collection Time: 02/14/20  4:22 AM   Specimen: Nasopharyngeal Swab; Nasopharyngeal(NP) swabs in vial transport medium  Result Value Ref Range Status   SARS Coronavirus 2 by RT PCR NEGATIVE NEGATIVE Final    Comment: (NOTE) SARS-CoV-2 target nucleic acids are NOT DETECTED.  The SARS-CoV-2 RNA is generally detectable in upper respiratory specimens during the acute phase of infection. The lowest concentration of SARS-CoV-2 viral copies this assay can detect is 138 copies/mL. A negative result does not preclude SARS-Cov-2 infection and should not be used as the sole basis for treatment or other patient management decisions. A negative result may occur with   improper specimen collection/handling, submission of specimen other than nasopharyngeal swab, presence of viral mutation(s) within the areas targeted by this assay, and inadequate number of viral copies(<138 copies/mL). A negative result must be combined with clinical observations, patient history, and epidemiological information. The expected result is Negative.  Fact Sheet for Patients:  02/16/20  Fact Sheet for Healthcare Providers:  BloggerCourse.com  This test is no t yet approved or cleared by the [employer]ious[employer]ker.[place] FDA and  has been authorized for detection and/or diagnosis of SARS-CoV-2 by FDA under an Emergency Use Authorization (EUA). This EUA will remain  in effect (meaning this test can be used) for the duration of the COVID-19 declaration under Section 564(b)(1) of the Act, 21 U.S.C.section 360bbb-3(b)(1), unless the authorization is terminated  or revoked sooner.       Influenza A by PCR NEGATIVE NEGATIVE Final   Influenza B by PCR NEGATIVE NEGATIVE Final    Comment: (NOTE) The Xpert Xpress SARS-CoV-2/FLU/RSV plus assay is intended as an aid in the diagnosis of influenza from Nasopharyngeal swab specimens and should not be used as a sole basis for treatment. Nasal washings and aspirates are unacceptable for Xpert Xpress SARS-CoV-2/FLU/RSV testing.  Fact Sheet for Patients: [place]  Fact Sheet for Healthcare Providers: BloggerCourse.com  This test is not yet approved or cleared by the SeriousBroker.it FDA and has been authorized for detection and/or diagnosis of SARS-CoV-2 by FDA under an Emergency Use Authorization (EUA). This EUA will remain in effect (meaning this test can be used) for the duration of the COVID-19 declaration under Section 564(b)(1) of the Act, 21 U.S.C. section 360bbb-3(b)(1), unless the authorization is terminated  or revoked.  Performed at [facility], [address], [place], [place]   Culture, blood (routine x 2)     Status: None (Preliminary result)   Collection Time: 02/14/20 11:03 AM   Specimen: BLOOD RIGHT ARM  Result Value Ref Range Status   Specimen Description BLOOD RIGHT ARM  Final   Special Requests   Final    BOTTLES DRAWN AEROBIC AND ANAEROBIC Blood Culture adequate volume   Culture   Final    NO GROWTH 4 DAYS Performed at [facility], [address], [place], [place]    Report Status PENDING  Incomplete  Culture, blood (routine x 2)     Status: Abnormal  Collection Time: 02/14/20  6:21 PM   Specimen: BLOOD  Result Value Ref Range Status   Specimen Description BLOOD BLOOD RIGHT FOREARM  Final   Special Requests   Final    BOTTLES DRAWN AEROBIC AND ANAEROBIC Blood Culture adequate volume   Culture  Setup Time   Final    GRAM NEGATIVE RODS ANAEROBIC BOTTLE ONLY Organism ID to follow CRITICAL RESULT CALLED TO, READ BACK BY AND VERIFIED WITH: Norva Riffle PharmD 10:25 02/16/20 ([email or web address]) Performed at [facility], [address] [place], [place] [address]    Culture (A)  Final    ESCHERICHIA COLI Confirmed Extended Spectrum Beta-Lactamase Producer (ESBL).  In bloodstream infections from ESBL organisms, carbapenems are preferred over piperacillin/tazobactam. They are shown to have a lower risk of mortality.    Report Status 02/18/2020 FINAL  Final   Organism ID, Bacteria ESCHERICHIA COLI  Final      Susceptibility   Escherichia coli - MIC*    AMPICILLIN >=32 RESISTANT Resistant     CEFAZOLIN >=64 RESISTANT Resistant     CEFEPIME >=32 RESISTANT Resistant     CEFTAZIDIME RESISTANT Resistant     CEFTRIAXONE >=64 RESISTANT Resistant     CIPROFLOXACIN >=4 RESISTANT Resistant     GENTAMICIN >=16 RESISTANT Resistant     IMIPENEM <=0.25 SENSITIVE Sensitive     TRIMETH/SULFA <=20 SENSITIVE Sensitive     AMPICILLIN/SULBACTAM >=32 RESISTANT  Resistant     PIP/TAZO 8 SENSITIVE Sensitive     * ESCHERICHIA COLI  Blood Culture ID Panel (Reflexed)     Status: Abnormal   Collection Time: 02/14/20  6:21 PM  Result Value Ref Range Status   Enterococcus faecalis NOT DETECTED NOT DETECTED Final   Enterococcus Faecium NOT DETECTED NOT DETECTED Final   Listeria monocytogenes NOT DETECTED NOT DETECTED Final   Staphylococcus species NOT DETECTED NOT DETECTED Final   Staphylococcus aureus (BCID) NOT DETECTED NOT DETECTED Final   Staphylococcus epidermidis NOT DETECTED NOT DETECTED Final   Staphylococcus lugdunensis NOT DETECTED NOT DETECTED Final   Streptococcus species NOT DETECTED NOT DETECTED Final   Streptococcus agalactiae NOT DETECTED NOT DETECTED Final   Streptococcus pneumoniae NOT DETECTED NOT DETECTED Final   Streptococcus pyogenes NOT DETECTED NOT DETECTED Final   A.calcoaceticus-baumannii NOT DETECTED NOT DETECTED Final   Bacteroides fragilis NOT DETECTED NOT DETECTED Final   Enterobacterales DETECTED (A) NOT DETECTED Final    Comment: Enterobacterales represent a large order of gram negative bacteria, not a single organism. CRITICAL RESULT CALLED TO, READ BACK BY AND VERIFIED WITH: Norva Riffle PharmD 10:25 02/16/20 ([email or web address])    Enterobacter cloacae complex NOT DETECTED NOT DETECTED Final   Escherichia coli DETECTED (A) NOT DETECTED Final    Comment: CRITICAL RESULT CALLED TO, READ BACK BY AND VERIFIED WITH: Norva Riffle PharmD 10:25 02/16/20 ([email or web address])    Klebsiella aerogenes NOT DETECTED NOT DETECTED Final   Klebsiella oxytoca NOT DETECTED NOT DETECTED Final   Klebsiella pneumoniae NOT DETECTED NOT DETECTED Final   Proteus species NOT DETECTED NOT DETECTED Final   Salmonella species NOT DETECTED NOT DETECTED Final   Serratia marcescens NOT DETECTED NOT DETECTED Final   Haemophilus influenzae NOT DETECTED NOT DETECTED Final   Neisseria meningitidis NOT DETECTED NOT DETECTED Final   Pseudomonas aeruginosa NOT DETECTED NOT  DETECTED Final   Stenotrophomonas maltophilia NOT DETECTED NOT DETECTED Final   Candida albicans NOT DETECTED NOT DETECTED Final   Candida auris NOT DETECTED NOT DETECTED Final   Candida glabrata NOT DETECTED  NOT DETECTED Final   Candida krusei NOT DETECTED NOT DETECTED Final   Candida parapsilosis NOT DETECTED NOT DETECTED Final   Candida tropicalis NOT DETECTED NOT DETECTED Final   Cryptococcus neoformans/gattii NOT DETECTED NOT DETECTED Final   CTX-M ESBL DETECTED (A) NOT DETECTED Final    Comment: CRITICAL RESULT CALLED TO, READ BACK BY AND VERIFIED WITH: Norva Riffle PharmD 10:25 02/16/20 ([email or web address]) (NOTE) Extended spectrum beta-lactamase detected. Recommend a carbapenem as initial therapy.      Carbapenem resistance IMP NOT DETECTED NOT DETECTED Final   Carbapenem resistance KPC NOT DETECTED NOT DETECTED Final   Carbapenem resistance NDM NOT DETECTED NOT DETECTED Final   Carbapenem resist OXA 48 LIKE NOT DETECTED NOT DETECTED Final   Carbapenem resistance VIM NOT DETECTED NOT DETECTED Final    Comment: Performed at [facility], [address] [place], [place] [address]         Radiology Studies: No results found.    Scheduled Meds: . aspirin  81 mg Oral Daily  . atenolol  25 mg Oral Daily  . atorvastatin  20 mg Oral QHS  . clopidogrel  75 mg Oral Daily  . docusate sodium  100 mg Oral BID  . enoxaparin (LOVENOX) injection  30 mg Subcutaneous Q24H  . loratadine  10 mg Oral Daily  . melatonin  6 mg Oral QHS  . multivitamin with minerals  1 tablet Oral Daily  . Muscle Rub  1 application Topical BID  . polyvinyl alcohol  1 drop Both Eyes TID  . pramipexole  0.125 mg Oral QHS  . sodium chloride flush  3 mL Intravenous Q12H   Continuous Infusions: . sodium chloride    . sodium chloride 100 mL/hr at 02/19/20 0917  . ertapenem 500 mg (02/19/20 1227)     LOS: 5 days      Calvert Cantor, MD [facility] Pager: [email or web address] 02/19/2020, 12:39 PM

## 2020-02-19 NOTE — Progress Notes (Signed)
[place] 1D17 [facility]) Hospitalized hospice patient visit.  [name] is new hospice patient admitted to [facility] hospice services yesterday with a hospice diagnosis of hypertensive heart disease. Patient became SOB, hypoxic, sats 83% with bradycardia, [name] activated EMS. Patient was transported to [facility] ED. Patient was admitted to [facility] on 02/14/2020 with a diagnosis of Acute respiratory failure with hypoxia and Acute pulmonary edema. Per Dr. Kirt Boys with [facility] this is a related hospital admission.   Visited patient at bedside. No visiters present. Patient is somnolent. I called her name but pt did not respond to voice but does respond with touch. Report exchanged at bedside and RN stated patient has been more sleepy today.   VS: 98.8, 105/44, 65, 18, 98% RA I/O: 474.3/100 Abnormal Labs: Glucose: 104 (H) BUN: 40 (H) Creatinine: 1.69 (H) Calcium: 8.7 (L) GFR, Estimated: 28 (L) WBC: 17.0 (H) RBC: 3.81 (L) Hemoglobin: 11.0 (L) HCT: 35.9 (L) RDW: 15.9 (H) NEUT#: 14.6 (H) Abs Immature Granulocytes: 0.12 (H)   Problem list:            Acute on chronic diastolic CHF (congestive heart failure) with acute respiratory failure            Recent sepsis with Klebsiella bacteremia and UTI and obstructing nephrolithiasis            CKD stage 3b            Poor oral intake Discharge planning: Ongoing. Plan is to discharge back to [place] once antibiotic treatment complete.  Family Contact: Called [name].  IDG: Updated GOC: DNR  Should ambulance transport be needed please [facility] as they contract this service for all of our active hospice patients.  A Please do not hesitate to call with questions.   Thank you,  Yolande Jolly, BSN, RN [facility] Liaison (listed on AMION under Hospice /[facility])  [phone]

## 2020-02-20 MED ORDER — SODIUM CHLORIDE 0.9 % IV SOLN: INTRAVENOUS | Status: DC

## 2020-02-20 MED ORDER — ENSURE ENLIVE PO LIQD
237.0000 mL | Freq: Three times a day (TID) | ORAL | Status: DC
  Administered 2020-02-20 – 2020-02-23 (×7): 237 mL via ORAL

## 2020-02-20 MED ORDER — FUROSEMIDE 10 MG/ML IJ SOLN
40.0000 mg | Freq: Once | INTRAMUSCULAR | Status: AC
  Administered 2020-02-20: 40 mg via INTRAVENOUS
  Filled 2020-02-20: qty 4

## 2020-02-20 NOTE — Progress Notes (Signed)
PROGRESS NOTE    [name]   YNW:[id number]  DOB: March 02, 1924  DOA: 02/14/2020 PCP: Jarrett Soho, PA-C    Brief Narrative:   Tamara Woodsis a 86 y.o.femalesent from [place] with medical history significant ofhypertension, peripheral vascular disease, carotid artery stenosiss/pendarterectomy, who is bedbound and presents with complaints of trouble breathing. She was admitted to the hospital from 02/03/20 through 02/09/20 for sepsis related to Klebsiella UTI complicated by an obstructing renal calculus  and bacteremia for which she received a nephrostomy tube. She also was found to have an NSTEMI during this admission. Palliative care discussions were started and hospice was initiated at the facility in which she was residing.   Enroute with EMS patient's O2 saturations were reportedly 84% on room air and improved to 100% on 4 L of nasal cannula oxygen.  CXR > Findings may reflect CHF/volume overload with cardiomegaly, interstitial and alveolar edema and small bilateral effusions. Background of chronic coarsened interstitial and bronchitic features.   Subjective: No complaints today.     Assessment & Plan:   Principal Problem:   Acute on chronic diastolic CHF (congestive heart failure) with acute respiratory failure - COVID and Influenza negative - treated with IV Lasix x 2 days and has improved  - per ECHO from 02/04/20, she has a normal EF (55-60%) and diastolic function was not able to be determined. The ECHO further mentions severe mitral calcification with mod-severe MR, mod aortic valve stenosis and mild AI - from the information I have received, she has recently been transitioned to hospice and is now receiving outpatient hospice services. In this case, I do not feel we should be very aggressive and transfer back to the hospital in case of another exacerbation. To prevent another exacerbation, we can use Lasix 40 mg daily PRN based on her symptoms at  [facility]. From what I have noted, her oral intake is not very good and daily Lasix would result in rapid dehydration.  Active Problems:  Recent sepsis with Klebsiella bacteremia and UTI and obstructing nephrolithiasis - she still has a percutaneous nephrostomy tube -   WBC count remains slightly elevated and one set of blood culture growing gram neg rods-per BCID this is an ESBL producing e coli- I have spoken with her caretaker (pastor), [name] who would like to initiate IV antibiotics to treat this infection - cont Ertapenem - last day of treatment will be 12/29   CKD stage 3b - Cr at baseline which appears to be 1.1-1.3 - Cr steadily rising due to poor oral intake- start IVF  Poor oral intake - does not eat much solid food but does drink liquids well    Time spent in minutes: 35 DVT prophylaxis: enoxaparin (LOVENOX) injection 30 mg Start: 02/14/20 0830  Code Status: DNR Family Communication:  Disposition Plan:  Status is: Inpatient  Remains inpatient appropriate because:IV treatments appropriate due to intensity of illness or inability to take PO   Dispo: The patient is from: SNF              Anticipated d/c is to: SNF              Anticipated d/c date is: 1 day              Patient currently is not medically stable to d/c.   Consultants:   none Procedures:   none Antimicrobials:  Anti-infectives (From admission, onward)   Start     Dose/Rate Route Frequency Ordered Stop  02/16/20 1145  ertapenem (INVANZ) 500 mg in sodium chloride 0.9 % 50 mL IVPB        500 mg 100 mL/hr over 30 Minutes Intravenous Every 24 hours 02/16/20 1051     02/16/20 1130  ertapenem [facility]) injection 500 mg  Status:  Discontinued        500 mg Intramuscular Every 24 hours 02/16/20 1035 02/16/20 1051       Objective: Vitals:   02/20/20 0258 02/20/20 0624 02/20/20 1001 02/20/20 1207  BP:  119/80 133/76 119/63  Pulse:  75 73 70  Resp:  20 16 16   Temp:  98.2 F (36.8 C)  (!) 97.1 F (36.2 C) 97.8 F (36.6 C)  TempSrc:  Oral Oral Oral  SpO2:  99% 100% 100%  Weight: 66.7 kg     Height:        Intake/Output Summary (Last 24 hours) at 02/20/2020 1341 Last data filed at 02/20/2020 1011 Gross per 24 hour  Intake 1410.09 ml  Output 955 ml  Net 455.09 ml   Filed Weights   02/18/20 0134 02/19/20 0500 02/20/20 0258  Weight: 67.1 kg 70.6 kg 66.7 kg    Examination: General exam: Appears comfortable  HEENT: PERRLA, oral mucosa moist, no sclera icterus or thrush Respiratory system: Clear to auscultation. Respiratory effort normal. Cardiovascular system: S1 & S2 heard,  RRR Gastrointestinal system: Abdomen soft, non-tender, nondistended. Normal bowel sounds   Central nervous system: Alert and oriented. No focal neurological deficits. Extremities: No cyanosis, clubbing or edema Skin: No rashes or ulcers Psychiatry:  Mood & affect appropriate.   Data Reviewed: I have personally reviewed following labs and imaging studies  CBC: Recent Labs  Lab 02/14/20 0422 02/15/20 0103 02/18/20 2056  WBC 15.4* 13.4* 17.0*  NEUTROABS 12.1* 10.0* 14.6*  HGB 11.5* 10.9* 11.0*  HCT 37.4 34.1* 35.9*  MCV 94.7 92.4 94.2  PLT 311 294 337   Basic Metabolic Panel: Recent Labs  Lab 02/15/20 0103 02/16/20 0257 02/17/20 0143 02/18/20 0232 02/19/20 0430  NA 141 140 137 138 140  K 3.9 3.6 4.9 3.9 4.5  CL 103 101 98 99 100  CO2 25 28 22 28 27   GLUCOSE 81 85 95 106* 104*  BUN 25* 28* 30* 38* 40*  CREATININE 1.11* 1.25* 1.32* 1.55* 1.69*  CALCIUM 8.5* 8.6* 8.9 8.7* 8.7*   GFR: Estimated Creatinine Clearance: 17.7 mL/min (A) (by C-G formula based on SCr of 1.69 mg/dL (H)). Liver Function Tests: No results for input(s): AST, ALT, ALKPHOS, BILITOT, PROT, ALBUMIN in the last 168 hours. No results for input(s): LIPASE, AMYLASE in the last 168 hours. No results for input(s): AMMONIA in the last 168 hours. Coagulation Profile: No results for input(s): INR, PROTIME in  the last 168 hours. Cardiac Enzymes: No results for input(s): CKTOTAL, CKMB, CKMBINDEX, TROPONINI in the last 168 hours. BNP (last 3 results) No results for input(s): PROBNP in the last 8760 hours. HbA1C: No results for input(s): HGBA1C in the last 72 hours. CBG: No results for input(s): GLUCAP in the last 168 hours. Lipid Profile: No results for input(s): CHOL, HDL, LDLCALC, TRIG, CHOLHDL, LDLDIRECT in the last 72 hours. Thyroid Function Tests: No results for input(s): TSH, T4TOTAL, FREET4, T3FREE, THYROIDAB in the last 72 hours. Anemia Panel: No results for input(s): VITAMINB12, FOLATE, FERRITIN, TIBC, IRON, RETICCTPCT in the last 72 hours. Urine analysis:    Component Value Date/Time   COLORURINE YELLOW 02/03/2020 2038   APPEARANCEUR CLOUDY (A) 02/03/2020 2038   LABSPEC  1.017 02/03/2020 2038   PHURINE 5.0 02/03/2020 2038   GLUCOSEU NEGATIVE 02/03/2020 2038   HGBUR MODERATE (A) 02/03/2020 2038   BILIRUBINUR NEGATIVE 02/03/2020 2038   KETONESUR NEGATIVE 02/03/2020 2038   PROTEINUR 100 (A) 02/03/2020 2038   UROBILINOGEN 0.2 04/10/2008 1519   NITRITE POSITIVE (A) 02/03/2020 2038   LEUKOCYTESUR LARGE (A) 02/03/2020 2038   Sepsis Labs: @LABRCNTIP (procalcitonin:4,lacticidven:4) ) Recent Results (from the past 240 hour(s))  Resp Panel by RT-PCR (Flu A&B, Covid) Nasopharyngeal Swab     Status: None   Collection Time: 02/14/20  4:22 AM   Specimen: Nasopharyngeal Swab; Nasopharyngeal(NP) swabs in vial transport medium  Result Value Ref Range Status   SARS Coronavirus 2 by RT PCR NEGATIVE NEGATIVE Final    Comment: (NOTE) SARS-CoV-2 target nucleic acids are NOT DETECTED.  The SARS-CoV-2 RNA is generally detectable in upper respiratory specimens during the acute phase of infection. The lowest concentration of SARS-CoV-2 viral copies this assay can detect is 138 copies/mL. A negative result does not preclude SARS-Cov-2 infection and should not be used as the sole basis for  treatment or other patient management decisions. A negative result may occur with  improper specimen collection/handling, submission of specimen other than nasopharyngeal swab, presence of viral mutation(s) within the areas targeted by this assay, and inadequate number of viral copies(<138 copies/mL). A negative result must be combined with clinical observations, patient history, and epidemiological information. The expected result is Negative.  Fact Sheet for Patients:  BloggerCourse.comhttps://www.fda.gov/media/152166/download  Fact Sheet for Healthcare Providers:  SeriousBroker.ithttps://www.fda.gov/media/152162/download  This test is no t yet approved or cleared by the [place] FDA and  has been authorized for detection and/or diagnosis of SARS-CoV-2 by FDA under an Emergency Use Authorization (EUA). This EUA will remain  in effect (meaning this test can be used) for the duration of the COVID-19 declaration under Section 564(b)(1) of the Act, 21 U.S.C.section 360bbb-3(b)(1), unless the authorization is terminated  or revoked sooner.       Influenza A by PCR NEGATIVE NEGATIVE Final   Influenza B by PCR NEGATIVE NEGATIVE Final    Comment: (NOTE) The Xpert Xpress SARS-CoV-2/FLU/RSV plus assay is intended as an aid in the diagnosis of influenza from Nasopharyngeal swab specimens and should not be used as a sole basis for treatment. Nasal washings and aspirates are unacceptable for Xpert Xpress SARS-CoV-2/FLU/RSV testing.  Fact Sheet for Patients: BloggerCourse.comhttps://www.fda.gov/media/152166/download  Fact Sheet for Healthcare Providers: SeriousBroker.ithttps://www.fda.gov/media/152162/download  This test is not yet approved or cleared by the [place] FDA and has been authorized for detection and/or diagnosis of SARS-CoV-2 by FDA under an Emergency Use Authorization (EUA). This EUA will remain in effect (meaning this test can be used) for the duration of the COVID-19 declaration under Section 564(b)(1) of the Act, 21  U.S.C. section 360bbb-3(b)(1), unless the authorization is terminated or revoked.  Performed at [facility], [address] [place], [place] [address]   Culture, blood (routine x 2)     Status: None   Collection Time: 02/14/20 11:03 AM   Specimen: BLOOD RIGHT ARM  Result Value Ref Range Status   Specimen Description BLOOD RIGHT ARM  Final   Special Requests   Final    BOTTLES DRAWN AEROBIC AND ANAEROBIC Blood Culture adequate volume   Culture   Final    NO GROWTH 5 DAYS Performed at [facility], [address] [place], [place] [address]    Report Status 02/19/2020 FINAL  Final  Culture, blood (routine x  2)     Status: Abnormal   Collection Time: 02/14/20  6:21 PM   Specimen: BLOOD  Result Value Ref Range Status   Specimen Description BLOOD BLOOD RIGHT FOREARM  Final   Special Requests   Final    BOTTLES DRAWN AEROBIC AND ANAEROBIC Blood Culture adequate volume   Culture  Setup Time   Final    GRAM NEGATIVE RODS ANAEROBIC BOTTLE ONLY Organism ID to follow CRITICAL RESULT CALLED TO, READ BACK BY AND VERIFIED WITH: Norva Riffle PharmD 10:25 02/16/20 ([email or web address]) Performed at [facility], [address] [place], [place] [address]    Culture (A)  Final    ESCHERICHIA COLI Confirmed Extended Spectrum Beta-Lactamase Producer (ESBL).  In bloodstream infections from ESBL organisms, carbapenems are preferred over piperacillin/tazobactam. They are shown to have a lower risk of mortality.    Report Status 02/18/2020 FINAL  Final   Organism ID, Bacteria ESCHERICHIA COLI  Final      Susceptibility   Escherichia coli - MIC*    AMPICILLIN >=32 RESISTANT Resistant     CEFAZOLIN >=64 RESISTANT Resistant     CEFEPIME >=32 RESISTANT Resistant     CEFTAZIDIME RESISTANT Resistant     CEFTRIAXONE >=64 RESISTANT Resistant     CIPROFLOXACIN >=4 RESISTANT Resistant     GENTAMICIN >=16 RESISTANT Resistant     IMIPENEM <=0.25 SENSITIVE Sensitive     TRIMETH/SULFA <=20  SENSITIVE Sensitive     AMPICILLIN/SULBACTAM >=32 RESISTANT Resistant     PIP/TAZO 8 SENSITIVE Sensitive     * ESCHERICHIA COLI  Blood Culture ID Panel (Reflexed)     Status: Abnormal   Collection Time: 02/14/20  6:21 PM  Result Value Ref Range Status   Enterococcus faecalis NOT DETECTED NOT DETECTED Final   Enterococcus Faecium NOT DETECTED NOT DETECTED Final   Listeria monocytogenes NOT DETECTED NOT DETECTED Final   Staphylococcus species NOT DETECTED NOT DETECTED Final   Staphylococcus aureus (BCID) NOT DETECTED NOT DETECTED Final   Staphylococcus epidermidis NOT DETECTED NOT DETECTED Final   Staphylococcus lugdunensis NOT DETECTED NOT DETECTED Final   Streptococcus species NOT DETECTED NOT DETECTED Final   Streptococcus agalactiae NOT DETECTED NOT DETECTED Final   Streptococcus pneumoniae NOT DETECTED NOT DETECTED Final   Streptococcus pyogenes NOT DETECTED NOT DETECTED Final   A.calcoaceticus-baumannii NOT DETECTED NOT DETECTED Final   Bacteroides fragilis NOT DETECTED NOT DETECTED Final   Enterobacterales DETECTED (A) NOT DETECTED Final    Comment: Enterobacterales represent a large order of gram negative bacteria, not a single organism. CRITICAL RESULT CALLED TO, READ BACK BY AND VERIFIED WITH: Norva Riffle PharmD 10:25 02/16/20 ([email or web address])    Enterobacter cloacae complex NOT DETECTED NOT DETECTED Final   Escherichia coli DETECTED (A) NOT DETECTED Final    Comment: CRITICAL RESULT CALLED TO, READ BACK BY AND VERIFIED WITH: Norva Riffle PharmD 10:25 02/16/20 ([email or web address])    Klebsiella aerogenes NOT DETECTED NOT DETECTED Final   Klebsiella oxytoca NOT DETECTED NOT DETECTED Final   Klebsiella pneumoniae NOT DETECTED NOT DETECTED Final   Proteus species NOT DETECTED NOT DETECTED Final   Salmonella species NOT DETECTED NOT DETECTED Final   Serratia marcescens NOT DETECTED NOT DETECTED Final   Haemophilus influenzae NOT DETECTED NOT DETECTED Final   Neisseria meningitidis NOT DETECTED  NOT DETECTED Final   Pseudomonas aeruginosa NOT DETECTED NOT DETECTED Final   Stenotrophomonas maltophilia NOT DETECTED NOT DETECTED Final   Candida albicans NOT DETECTED NOT DETECTED Final   Candida auris NOT DETECTED  NOT DETECTED Final   Candida glabrata NOT DETECTED NOT DETECTED Final   Candida krusei NOT DETECTED NOT DETECTED Final   Candida parapsilosis NOT DETECTED NOT DETECTED Final   Candida tropicalis NOT DETECTED NOT DETECTED Final   Cryptococcus neoformans/gattii NOT DETECTED NOT DETECTED Final   CTX-M ESBL DETECTED (A) NOT DETECTED Final    Comment: CRITICAL RESULT CALLED TO, READ BACK BY AND VERIFIED WITH: Norva Riffle PharmD 10:25 02/16/20 ([email or web address]) (NOTE) Extended spectrum beta-lactamase detected. Recommend a carbapenem as initial therapy.      Carbapenem resistance IMP NOT DETECTED NOT DETECTED Final   Carbapenem resistance KPC NOT DETECTED NOT DETECTED Final   Carbapenem resistance NDM NOT DETECTED NOT DETECTED Final   Carbapenem resist OXA 48 LIKE NOT DETECTED NOT DETECTED Final   Carbapenem resistance VIM NOT DETECTED NOT DETECTED Final    Comment: Performed at [facility], [address] [place], [place] [address]  Culture, blood (routine x 2)     Status: None (Preliminary result)   Collection Time: 02/18/20  8:39 PM   Specimen: BLOOD  Result Value Ref Range Status   Specimen Description BLOOD RIGHT ARM  Final   Special Requests   Final    BOTTLES DRAWN AEROBIC AND ANAEROBIC Blood Culture adequate volume   Culture   Final    NO GROWTH 2 DAYS Performed at [facility], [address], [place], [place] [address]    Report Status PENDING  Incomplete  Culture, blood (routine x 2)     Status: None (Preliminary result)   Collection Time: 02/18/20  8:58 PM   Specimen: BLOOD LEFT HAND  Result Value Ref Range Status   Specimen Description BLOOD LEFT HAND  Final   Special Requests   Final    BOTTLES DRAWN AEROBIC AND ANAEROBIC Blood Culture adequate  volume   Culture   Final    NO GROWTH 2 DAYS Performed at [facility], [address] [place], [place] [address]    Report Status PENDING  Incomplete         Radiology Studies: DG CHEST PORT 1 VIEW  Result Date: 02/19/2020 CLINICAL DATA:  Shortness of breath EXAM: PORTABLE CHEST 1 VIEW COMPARISON:  February 14, 2020 FINDINGS: There is cardiomegaly. There are bilateral pleural effusions which have increased in size from prior study. There is vascular congestion with interstitial edema. There are bibasilar airspace opacities favored to represent atelectasis. There is no pneumothorax. No acute osseous abnormality. Aortic calcifications are noted. IMPRESSION: Findings are again suggestive of congestive heart failure. There are growing bilateral pleural effusions. Electronically Signed   By: Katherine Mantle M.D.   On: 02/19/2020 21:58      Scheduled Meds: . aspirin  81 mg Oral Daily  . atenolol  25 mg Oral Daily  . atorvastatin  20 mg Oral QHS  . clopidogrel  75 mg Oral Daily  . docusate sodium  100 mg Oral BID  . enoxaparin (LOVENOX) injection  30 mg Subcutaneous Q24H  . loratadine  10 mg Oral Daily  . melatonin  6 mg Oral QHS  . multivitamin with minerals  1 tablet Oral Daily  . Muscle Rub  1 application Topical BID  . polyvinyl alcohol  1 drop Both Eyes TID  . pramipexole  0.125 mg Oral QHS  . sodium chloride flush  3 mL Intravenous Q12H   Continuous Infusions: . sodium chloride    . ertapenem 500 mg (02/20/20 1216)  LOS: 6 days      Calvert Cantor, MD [facility] Pager: [email or web address] 02/20/2020, 1:41 PM

## 2020-02-20 NOTE — Progress Notes (Signed)
   02/19/20 2114  Assess: MEWS Score  Temp 98.9 F (37.2 C)  BP 135/64  Pulse Rate 76  Resp (!) 27  Level of Consciousness Alert  SpO2 98 %  O2 Device Nasal Cannula  O2 Flow Rate (L/min) 3 L/min  Assess: MEWS Score  MEWS Temp 0  MEWS Systolic 0  MEWS Pulse 0  MEWS RR 2  MEWS LOC 0  MEWS Score 2  MEWS Score Color Yellow  Assess: if the MEWS score is Yellow or Red  Were vital signs taken at a resting state? Yes  Focused Assessment No change from prior assessment  Early Detection of Sepsis Score *See Row Information* Low  MEWS guidelines implemented *See Row Information* Yes  Treat  Pain Scale PAINAD  Breathing 2  Negative Vocalization 1  Facial Expression 1  Body Language 1  Consolability 2  PAINAD Score 7  Take Vital Signs  Increase Vital Sign Frequency  Yellow: Q 2hr X 2 then Q 4hr X 2, if remains yellow, continue Q 4hrs  Escalate  MEWS: Escalate Yellow: discuss with charge nurse/RN and consider discussing with provider and RRT  Notify: Charge Nurse/RN  Name of Charge Nurse/RN Notified Lyn RN  Date Charge Nurse/RN Notified 02/19/20  Time Charge Nurse/RN Notified 2200  Notify: Provider  Provider Name/Title Zierle-Gosh  Date Provider Notified 02/19/20  Time Provider Notified 1916  Notification Type Page (secure chat)  Notification Reason Change in status  Response See new orders  Date of Provider Response 02/19/20  Time of Provider Response 1918  Document  Patient Outcome Not stable and remains on department  Progress note created (see row info) Yes  Chest x-ray ordered - see results. See MAR for new medication orders.

## 2020-02-20 NOTE — Progress Notes (Signed)
Nutrition Follow-up  DOCUMENTATION CODES:   Non-severe (moderate) malnutrition in context of chronic illness  INTERVENTION:   -Continue MVI with minerals daily -Continue MVI with minerals daily -Ensure Enlive po TID, each supplement provides 350 kcal and 20 grams of protein -Hormel Shake TID with meals, each supplement provides 520 kcals and 22 grams protein  NUTRITION DIAGNOSIS:   Moderate Malnutrition related to chronic illness (PVD s/p endarterectomy) as evidenced by moderate fat depletion,severe muscle depletion,mild muscle depletion.  Ongoing  GOAL:   Patient will meet greater than or equal to 90% of their needs  Progressing   MONITOR:   PO intake,Supplement acceptance,Weight trends,Labs  REASON FOR ASSESSMENT:   Malnutrition Screening Tool    ASSESSMENT:   86 y.o. female with PMH of HTN, PVD, carotid endarterectomy and is bed bound. Admitted 12/10-12/16 to the ICU with septic shock and UTI complicated by hydronephrosis.  Presents from hospice with complaints of trouble breathing. Admitted for respiratory failure with hypoxia secondary to exacerbation of diastolic CHF.  Reviewed I/O's: +909 ml x 24 hours and -1.9 L since admission  UOP: 855 ml x 24 hours  Pt with very poor oral intake. Noted meal completions 0%. Per chart review, pt takes liquids well, but does not each much solid foods.   Per MD notes, plan to d/c back to SNF once medically stable. Pt continues to be followed by hospice.  Medications reviewed and include colace and melatonin  Labs reviewed.   Diet Order:   Diet Order            Diet general           DIET - DYS 1 Room service appropriate? Yes with Assist; Fluid consistency: Thin  Diet effective now                 EDUCATION NEEDS:   Not appropriate for education at this time  Skin:  Skin Assessment: Skin Integrity Issues: Skin Integrity Issues:: Other (Comment) Other: MASD- R & L buttocks  Last BM:  02/15/20  Height:    Ht Readings from Last 1 Encounters:  02/14/20 5' (1.524 m)    Weight:   Wt Readings from Last 1 Encounters:  02/20/20 66.7 kg   BMI:  Body mass index is 28.71 kg/m.  Estimated Nutritional Needs:   Kcal:  1500-1700 kcal  Protein:  75-90 grams  Fluid:  >1.5 L/day    Levada Schilling, RD, LDN, CDCES Registered Dietitian II Certified Diabetes Care and Education Specialist Please refer to [facility] for RD and/or RD on-call/weekend/after hours pager

## 2020-02-20 NOTE — Progress Notes (Signed)
[place] [facility]) Hospitalized hospice patient visit.  Kyler Lerette is a hospice patient admitted to hospice services on 12/20 with a terminal diagnosis of hypertensive heart disease. On 12/21, patient became SOB, hypoxic, sats 83% and bradycardic. Her facility activated EMS, patient was transported to [facility] ED. Patient was admitted to [facility] on 02/14/2020 with a diagnosis of acute respiratory failure with hypoxia and flash pulmonary edema. Per Dr. Kirt Boys with [facility] this is a related hospital admission.   Visited patient at the bedside. She is alert and oriented. States feels "ok", she appears more despondent than she usually is. She denied any pain and states the hospital staff are taking good care of her.  V/S: 97.8 oral, 119/63, HR 70, RR 16, SPO2 100% on [place] @ 3 lpm I&O: 1764/855 Labs: none within the last 24 hours IVs/PRNs: lasix 40 mg IV x 1 during HS due to SOB, NS @ 100 ml/h2, stopped at MN due to SOB, invanz 500 mg IV QD Diagnostics during the night due to SOB portable chest: Findings are again suggestive of congestive heart failure. There are growing bilateral pleural effusions.  Problem List: - acute on chronic heart failure with respiratory failure - diuresed last night, portable chest last night revealed pleural effusions, IV fluids stopped, pt on O2 3 lpm - urosepsis and obstructing nephrolithiasis - percutaneous nephrostomy tube in place, on ivanz IV   GOC: return to [place] for EOL care with hospice support after completion of treatment for urosepsis and congestive heart failure D/C planning: return to [place], they would like for her to return now and finish her course of IV abx, however her NOK Cecille Po) did not want her to return to her facility until she was medically optimized Family: Reverence Tresa Endo is her NOK, updated IDT: Hospice team updated.  Wallis Bamberg RN, BSN, CCRN [facility] Liaison

## 2020-02-21 LAB — BASIC METABOLIC PANEL
Anion gap: 10 (ref 5–15)
BUN: 39 mg/dL — ABNORMAL HIGH (ref 8–23)
CO2: 27 mmol/L (ref 22–32)
Calcium: 8.5 mg/dL — ABNORMAL LOW (ref 8.9–10.3)
Chloride: 110 mmol/L (ref 98–111)
Creatinine, Ser: 1.39 mg/dL — ABNORMAL HIGH (ref 0.44–1.00)
GFR, Estimated: 35 mL/min — ABNORMAL LOW (ref >60)
Glucose, Bld: 120 mg/dL — ABNORMAL HIGH (ref 70–99)
Potassium: 4.2 mmol/L (ref 3.5–5.1)
Sodium: 147 mmol/L — ABNORMAL HIGH (ref 135–145)

## 2020-02-21 LAB — CBC
HCT: 37.4 % (ref 36.0–46.0)
Hemoglobin: 10.9 g/dL — ABNORMAL LOW (ref 12.0–15.0)
MCH: 28.8 pg (ref 26.0–34.0)
MCHC: 29.1 g/dL — ABNORMAL LOW (ref 30.0–36.0)
MCV: 98.9 fL (ref 80.0–100.0)
Platelets: 360 10*3/uL (ref 150–400)
RBC: 3.78 MIL/uL — ABNORMAL LOW (ref 3.87–5.11)
RDW: 16.5 % — ABNORMAL HIGH (ref 11.5–15.5)
WBC: 11.2 10*3/uL — ABNORMAL HIGH (ref 4.0–10.5)
nRBC: 0 % (ref 0.0–0.2)

## 2020-02-21 NOTE — Progress Notes (Signed)
[place] [id number] [facility]) Hospitalized hospice patient visit.  Giah Fickett is a hospice patient admitted to hospice services on 12/20 with a terminal diagnosis of hypertensive heart disease. On 12/21, patient became SOB, hypoxic, sats 83% and bradycardic. Her facility activated EMS, patient was transported to [facility] ED. Patient was admitted to [facility] on 02/14/2020 with a diagnosis of acute respiratory failure with hypoxia and flash pulmonary edema. Per Dr. Kirt Boys with [facility] this is a related hospital admission.   Visited patient at the bedside. She is alert but seems more confused today. Her speech is less clear also. She does not appear in distress and denies pain. No visitors at bedside.   VS: 97.7, 154/86, 76, 20, 99% on 5Lnc  I/O: 1566/575 Abnormal Labs:  02/21/2020 09:32 Sodium: 147 (H) Glucose: 120 (H) BUN: 39 (H) Creatinine: 1.39 (H) Calcium: 8.5 (L) GFR, Estimated: 35 (L) WBC: 11.2 (H) RBC: 3.78 (L) Hemoglobin: 10.9 (L) MCHC: 29.1 (L) RDW: 16.5 (H)  Problem List: - acute on chronic heart failure with respiratory failure - diuresed last night, portable chest last night revealed pleural effusions, IV fluids stopped, pt on O2 3 lpm - urosepsis and obstructing nephrolithiasis - percutaneous nephrostomy tube in place, on ivanz IV  GOC: return to [place] for EOL care with hospice support after completion of treatment for urosepsis and congestive heart failure D/C planning: return to [place], they would like for her to return now and finish her course of IV abx, however her NOK Cecille Po) did not want her to return to her facility until she was medically optimized Family: Reverence Tresa Endo is her NOK, Left VM message  IDT: Hospice team updated.  Please use [facility] for ambulance transport.   Elsie Saas, RN, [facility]   [facility] Liaison (listed on AMION under Hospice/[phone]

## 2020-02-21 NOTE — Progress Notes (Signed)
PROGRESS NOTE    [name]   BBC:[id number]  DOB: 1924-12-17  DOA: 02/14/2020 PCP: Jarrett Soho, PA-C    Brief Narrative:   Theta Woodsis a 86 y.o.femalesent from [place] with medical history significant ofhypertension, peripheral vascular disease, carotid artery stenosiss/pendarterectomy, who is bedbound and presents with complaints of trouble breathing. She was admitted to the hospital from 02/03/20 through 02/09/20 for sepsis related to Klebsiella UTI complicated by an obstructing renal calculus  and bacteremia for which she received a nephrostomy tube. She also was found to have an NSTEMI during this admission. Palliative care discussions were started and hospice was initiated at the facility in which she was residing.   Enroute with EMS patient's O2 saturations were reportedly 84% on room air and improved to 100% on 4 L of nasal cannula oxygen.  CXR > Findings may reflect CHF/volume overload with cardiomegaly, interstitial and alveolar edema and small bilateral effusions. Background of chronic coarsened interstitial and bronchitic features.   Subjective: No complaints today.     Assessment & Plan:   Principal Problem:   Acute on chronic diastolic CHF (congestive heart failure) with acute respiratory failure - COVID and Influenza negative - treated with IV Lasix x 2 days and has improved  - per ECHO from 02/04/20, she has a normal EF (55-60%) and diastolic function was not able to be determined. The ECHO further mentions severe mitral calcification with mod-severe MR, mod aortic valve stenosis and mild AI - from the information I have received, she has recently been transitioned to hospice and is now receiving outpatient hospice services. In this case, I do not feel we should be very aggressive and transfer back to the hospital in case of another exacerbation. To prevent another exacerbation, we can use Lasix 40 mg daily PRN based on her symptoms at  [facility]. From what I have noted, her oral intake is not very good and daily Lasix would result in rapid dehydration.  Active Problems:  Recent sepsis with Klebsiella bacteremia and UTI and obstructing nephrolithiasis - she still has a percutaneous nephrostomy tube and now that she is hospice, it is doubtful nephrology will perform a lithotripsy -   WBC count remains slightly elevated and one set of blood culture growing gram neg rods-per BCID this is an ESBL producing e coli- I have spoken with her caretaker (pastor), [name] who would like to initiate IV antibiotics to treat this infection - cont Ertapenem - WBC count has steadily improved and is 11.2 today - last day of treatment will be 12/29 after which she can d/c to SNF with hospice care   CKD stage 3b -   baseline which appears to be 1.1-1.3   Poor oral intake - does not eat much solid food but does drink liquids well when given to her    Time spent in minutes: 35 DVT prophylaxis: enoxaparin (LOVENOX) injection 30 mg Start: 02/14/20 0830  Code Status: DNR Family Communication: pastor, [name] is the main decision maker Disposition Plan: back to SNF with hospice on 12/29 Status is: Inpatient  Remains inpatient appropriate because:IV treatments appropriate due to intensity of illness or inability to take PO   Dispo: The patient is from: SNF              Anticipated d/c is to: SNF              Anticipated d/c date is: 1 day  Patient currently is not medically stable to d/c.   Consultants:   none Procedures:   none Antimicrobials:  Anti-infectives (From admission, onward)   Start     Dose/Rate Route Frequency Ordered Stop   02/16/20 1145  ertapenem (INVANZ) 500 mg in sodium chloride 0.9 % 50 mL IVPB        500 mg 100 mL/hr over 30 Minutes Intravenous Every 24 hours 02/16/20 1051 02/23/20 1144   02/16/20 1130  ertapenem [facility]) injection 500 mg  Status:  Discontinued        500 mg  Intramuscular Every 24 hours 02/16/20 1035 02/16/20 1051       Objective: Vitals:   02/20/20 1931 02/21/20 0349 02/21/20 1127 02/21/20 1128  BP: 124/69 130/76 (!) 154/86 (!) 154/86  Pulse: 68 78 76 76  Resp: 20 20 20 20   Temp: 97.6 F (36.4 C) 97.8 F (36.6 C) 97.7 F (36.5 C) 97.7 F (36.5 C)  TempSrc: Oral Oral Oral Oral  SpO2: 100% 100%  99%  Weight:      Height:        Intake/Output Summary (Last 24 hours) at 02/21/2020 1434 Last data filed at 02/21/2020 1224 Gross per 24 hour  Intake 1206.05 ml  Output 325 ml  Net 881.05 ml   Filed Weights   02/18/20 0134 02/19/20 0500 02/20/20 0258  Weight: 67.1 kg 70.6 kg 66.7 kg    Examination: General exam: Appears comfortable  HEENT: PERRLA, oral mucosa moist, no sclera icterus or thrush Respiratory system: Clear to auscultation. Respiratory effort normal. Cardiovascular system: S1 & S2 heard,  No murmurs  Gastrointestinal system: Abdomen soft, non-tender, nondistended. Normal bowel sounds   Central nervous system: Alert and oriented. No focal neurological deficits. Extremities: No cyanosis, clubbing or edema Skin: No rashes or ulcers Psychiatry:  Mood & affect appropriate.    Data Reviewed: I have personally reviewed following labs and imaging studies  CBC: Recent Labs  Lab 02/15/20 0103 02/18/20 2056 02/21/20 0932  WBC 13.4* 17.0* 11.2*  NEUTROABS 10.0* 14.6*  --   HGB 10.9* 11.0* 10.9*  HCT 34.1* 35.9* 37.4  MCV 92.4 94.2 98.9  PLT 294 337 360   Basic Metabolic Panel: Recent Labs  Lab 02/16/20 0257 02/17/20 0143 02/18/20 0232 02/19/20 0430 02/21/20 0932  NA 140 137 138 140 147*  K 3.6 4.9 3.9 4.5 4.2  CL 101 98 99 100 110  CO2 28 22 28 27 27   GLUCOSE 85 95 106* 104* 120*  BUN 28* 30* 38* 40* 39*  CREATININE 1.25* 1.32* 1.55* 1.69* 1.39*  CALCIUM 8.6* 8.9 8.7* 8.7* 8.5*   GFR: Estimated Creatinine Clearance: 21.6 mL/min (A) (by C-G formula based on SCr of 1.39 mg/dL (H)). Liver Function  Tests: No results for input(s): AST, ALT, ALKPHOS, BILITOT, PROT, ALBUMIN in the last 168 hours. No results for input(s): LIPASE, AMYLASE in the last 168 hours. No results for input(s): AMMONIA in the last 168 hours. Coagulation Profile: No results for input(s): INR, PROTIME in the last 168 hours. Cardiac Enzymes: No results for input(s): CKTOTAL, CKMB, CKMBINDEX, TROPONINI in the last 168 hours. BNP (last 3 results) No results for input(s): PROBNP in the last 8760 hours. HbA1C: No results for input(s): HGBA1C in the last 72 hours. CBG: No results for input(s): GLUCAP in the last 168 hours. Lipid Profile: No results for input(s): CHOL, HDL, LDLCALC, TRIG, CHOLHDL, LDLDIRECT in the last 72 hours. Thyroid Function Tests: No results for input(s): TSH, T4TOTAL, FREET4, T3FREE, THYROIDAB  in the last 72 hours. Anemia Panel: No results for input(s): VITAMINB12, FOLATE, FERRITIN, TIBC, IRON, RETICCTPCT in the last 72 hours. Urine analysis:    Component Value Date/Time   COLORURINE YELLOW 02/03/2020 2038   APPEARANCEUR CLOUDY (A) 02/03/2020 2038   LABSPEC 1.017 02/03/2020 2038   PHURINE 5.0 02/03/2020 2038   GLUCOSEU NEGATIVE 02/03/2020 2038   HGBUR MODERATE (A) 02/03/2020 2038   BILIRUBINUR NEGATIVE 02/03/2020 2038   KETONESUR NEGATIVE 02/03/2020 2038   PROTEINUR 100 (A) 02/03/2020 2038   UROBILINOGEN 0.2 04/10/2008 1519   NITRITE POSITIVE (A) 02/03/2020 2038   LEUKOCYTESUR LARGE (A) 02/03/2020 2038   Sepsis Labs: (procalcitonin:4,lacticidven:4) ) Recent Results (from the past 240 hour(s))  Resp Panel by RT-PCR (Flu A&B, Covid) Nasopharyngeal Swab     Status: None   Collection Time: 02/14/20  4:22 AM   Specimen: Nasopharyngeal Swab; Nasopharyngeal(NP) swabs in vial transport medium  Result Value Ref Range Status   SARS Coronavirus 2 by RT PCR NEGATIVE NEGATIVE Final    Comment: (NOTE) SARS-CoV-2 target nucleic acids are NOT DETECTED.  The SARS-CoV-2 RNA is generally  detectable in upper respiratory specimens during the acute phase of infection. The lowest concentration of SARS-CoV-2 viral copies this assay can detect is 138 copies/mL. A negative result does not preclude SARS-Cov-2 infection and should not be used as the sole basis for treatment or other patient management decisions. A negative result may occur with  improper specimen collection/handling, submission of specimen other than nasopharyngeal swab, presence of viral mutation(s) within the areas targeted by this assay, and inadequate number of viral copies(<138 copies/mL). A negative result must be combined with clinical observations, patient history, and epidemiological information. The expected result is Negative.  Fact Sheet for Patients:  BloggerCourse.com  Fact Sheet for Healthcare Providers:  SeriousBroker.it  This test is no t yet approved or cleared by the [place] FDA and  has been authorized for detection and/or diagnosis of SARS-CoV-2 by FDA under an Emergency Use Authorization (EUA). This EUA will remain  in effect (meaning this test can be used) for the duration of the COVID-19 declaration under Section 564(b)(1) of the Act, 21 U.S.C.section 360bbb-3(b)(1), unless the authorization is terminated  or revoked sooner.       Influenza A by PCR NEGATIVE NEGATIVE Final   Influenza B by PCR NEGATIVE NEGATIVE Final    Comment: (NOTE) The Xpert Xpress SARS-CoV-2/FLU/RSV plus assay is intended as an aid in the diagnosis of influenza from Nasopharyngeal swab specimens and should not be used as a sole basis for treatment. Nasal washings and aspirates are unacceptable for Xpert Xpress SARS-CoV-2/FLU/RSV testing.  Fact Sheet for Patients: BloggerCourse.com  Fact Sheet for Healthcare Providers: SeriousBroker.it  This test is not yet approved or cleared by the [place] FDA  and has been authorized for detection and/or diagnosis of SARS-CoV-2 by FDA under an Emergency Use Authorization (EUA). This EUA will remain in effect (meaning this test can be used) for the duration of the COVID-19 declaration under Section 564(b)(1) of the Act, 21 U.S.C. section 360bbb-3(b)(1), unless the authorization is terminated or revoked.  Performed at [facility], [address] [place], [place] [address]   Culture, blood (routine x 2)     Status: None   Collection Time: 02/14/20 11:03 AM   Specimen: BLOOD RIGHT ARM  Result Value Ref Range Status   Specimen Description BLOOD RIGHT ARM  Final   Special Requests   Final    BOTTLES DRAWN AEROBIC  AND ANAEROBIC Blood Culture adequate volume   Culture   Final    NO GROWTH 5 DAYS Performed at [facility], [address] [place], [place] [address]    Report Status 02/19/2020 FINAL  Final  Culture, blood (routine x 2)     Status: Abnormal   Collection Time: 02/14/20  6:21 PM   Specimen: BLOOD  Result Value Ref Range Status   Specimen Description BLOOD BLOOD RIGHT FOREARM  Final   Special Requests   Final    BOTTLES DRAWN AEROBIC AND ANAEROBIC Blood Culture adequate volume   Culture  Setup Time   Final    GRAM NEGATIVE RODS ANAEROBIC BOTTLE ONLY Organism ID to follow CRITICAL RESULT CALLED TO, READ BACK BY AND VERIFIED WITH: Norva RiffleE. Sinclair PharmD 10:25 02/16/20 ([email or web address]) Performed at [facility], [address] [place], [place] [address]    Culture (A)  Final    ESCHERICHIA COLI Confirmed Extended Spectrum Beta-Lactamase Producer (ESBL).  In bloodstream infections from ESBL organisms, carbapenems are preferred over piperacillin/tazobactam. They are shown to have a lower risk of mortality.    Report Status 02/18/2020 FINAL  Final   Organism ID, Bacteria ESCHERICHIA COLI  Final      Susceptibility   Escherichia coli - MIC*    AMPICILLIN >=32 RESISTANT Resistant     CEFAZOLIN >=64 RESISTANT Resistant      CEFEPIME >=32 RESISTANT Resistant     CEFTAZIDIME RESISTANT Resistant     CEFTRIAXONE >=64 RESISTANT Resistant     CIPROFLOXACIN >=4 RESISTANT Resistant     GENTAMICIN >=16 RESISTANT Resistant     IMIPENEM <=0.25 SENSITIVE Sensitive     TRIMETH/SULFA <=20 SENSITIVE Sensitive     AMPICILLIN/SULBACTAM >=32 RESISTANT Resistant     PIP/TAZO 8 SENSITIVE Sensitive     * ESCHERICHIA COLI  Blood Culture ID Panel (Reflexed)     Status: Abnormal   Collection Time: 02/14/20  6:21 PM  Result Value Ref Range Status   Enterococcus faecalis NOT DETECTED NOT DETECTED Final   Enterococcus Faecium NOT DETECTED NOT DETECTED Final   Listeria monocytogenes NOT DETECTED NOT DETECTED Final   Staphylococcus species NOT DETECTED NOT DETECTED Final   Staphylococcus aureus (BCID) NOT DETECTED NOT DETECTED Final   Staphylococcus epidermidis NOT DETECTED NOT DETECTED Final   Staphylococcus lugdunensis NOT DETECTED NOT DETECTED Final   Streptococcus species NOT DETECTED NOT DETECTED Final   Streptococcus agalactiae NOT DETECTED NOT DETECTED Final   Streptococcus pneumoniae NOT DETECTED NOT DETECTED Final   Streptococcus pyogenes NOT DETECTED NOT DETECTED Final   A.calcoaceticus-baumannii NOT DETECTED NOT DETECTED Final   Bacteroides fragilis NOT DETECTED NOT DETECTED Final   Enterobacterales DETECTED (A) NOT DETECTED Final    Comment: Enterobacterales represent a large order of gram negative bacteria, not a single organism. CRITICAL RESULT CALLED TO, READ BACK BY AND VERIFIED WITH: Norva RiffleE. Sinclair PharmD 10:25 02/16/20 ([email or web address])    Enterobacter cloacae complex NOT DETECTED NOT DETECTED Final   Escherichia coli DETECTED (A) NOT DETECTED Final    Comment: CRITICAL RESULT CALLED TO, READ BACK BY AND VERIFIED WITH: Norva RiffleE. Sinclair PharmD 10:25 02/16/20 ([email or web address])    Klebsiella aerogenes NOT DETECTED NOT DETECTED Final   Klebsiella oxytoca NOT DETECTED NOT DETECTED Final   Klebsiella pneumoniae NOT DETECTED NOT DETECTED  Final   Proteus species NOT DETECTED NOT DETECTED Final   Salmonella species NOT DETECTED NOT DETECTED Final   Serratia marcescens NOT DETECTED NOT DETECTED Final   Haemophilus influenzae NOT  DETECTED NOT DETECTED Final   Neisseria meningitidis NOT DETECTED NOT DETECTED Final   Pseudomonas aeruginosa NOT DETECTED NOT DETECTED Final   Stenotrophomonas maltophilia NOT DETECTED NOT DETECTED Final   Candida albicans NOT DETECTED NOT DETECTED Final   Candida auris NOT DETECTED NOT DETECTED Final   Candida glabrata NOT DETECTED NOT DETECTED Final   Candida krusei NOT DETECTED NOT DETECTED Final   Candida parapsilosis NOT DETECTED NOT DETECTED Final   Candida tropicalis NOT DETECTED NOT DETECTED Final   Cryptococcus neoformans/gattii NOT DETECTED NOT DETECTED Final   CTX-M ESBL DETECTED (A) NOT DETECTED Final    Comment: CRITICAL RESULT CALLED TO, READ BACK BY AND VERIFIED WITH: Norva Riffle PharmD 10:25 02/16/20 ([email or web address]) (NOTE) Extended spectrum beta-lactamase detected. Recommend a carbapenem as initial therapy.      Carbapenem resistance IMP NOT DETECTED NOT DETECTED Final   Carbapenem resistance KPC NOT DETECTED NOT DETECTED Final   Carbapenem resistance NDM NOT DETECTED NOT DETECTED Final   Carbapenem resist OXA 48 LIKE NOT DETECTED NOT DETECTED Final   Carbapenem resistance VIM NOT DETECTED NOT DETECTED Final    Comment: Performed at [facility], [address] [place], [place] [address]  Culture, blood (routine x 2)     Status: None (Preliminary result)   Collection Time: 02/18/20  8:39 PM   Specimen: BLOOD  Result Value Ref Range Status   Specimen Description BLOOD RIGHT ARM  Final   Special Requests   Final    BOTTLES DRAWN AEROBIC AND ANAEROBIC Blood Culture adequate volume   Culture   Final    NO GROWTH 2 DAYS Performed at [facility], [address] [place], [place] [address]    Report Status PENDING  Incomplete  Culture, blood (routine x 2)     Status:  None (Preliminary result)   Collection Time: 02/18/20  8:58 PM   Specimen: BLOOD LEFT HAND  Result Value Ref Range Status   Specimen Description BLOOD LEFT HAND  Final   Special Requests   Final    BOTTLES DRAWN AEROBIC AND ANAEROBIC Blood Culture adequate volume   Culture   Final    NO GROWTH 2 DAYS Performed at [facility], [address], [place], [place] [address]    Report Status PENDING  Incomplete         Radiology Studies: DG CHEST PORT 1 VIEW  Result Date: 02/19/2020 CLINICAL DATA:  Shortness of breath EXAM: PORTABLE CHEST 1 VIEW COMPARISON:  February 14, 2020 FINDINGS: There is cardiomegaly. There are bilateral pleural effusions which have increased in size from prior study. There is vascular congestion with interstitial edema. There are bibasilar airspace opacities favored to represent atelectasis. There is no pneumothorax. No acute osseous abnormality. Aortic calcifications are noted. IMPRESSION: Findings are again suggestive of congestive heart failure. There are growing bilateral pleural effusions. Electronically Signed   By: Katherine Mantle M.D.   On: 02/19/2020 21:58      Scheduled Meds: . aspirin  81 mg Oral Daily  . atenolol  25 mg Oral Daily  . atorvastatin  20 mg Oral QHS  . clopidogrel  75 mg Oral Daily  . docusate sodium  100 mg Oral BID  . enoxaparin (LOVENOX) injection  30 mg Subcutaneous Q24H  . feeding supplement  237 mL Oral TID BM  . loratadine  10 mg Oral Daily  . melatonin  6 mg Oral QHS  . multivitamin with minerals  1 tablet Oral Daily  .  Muscle Rub  1 application Topical BID  . polyvinyl alcohol  1 drop Both Eyes TID  . pramipexole  0.125 mg Oral QHS  . sodium chloride flush  3 mL Intravenous Q12H   Continuous Infusions: . sodium chloride    . sodium chloride 75 mL/hr at 02/20/20 1527  . ertapenem 500 mg (02/21/20 1058)     LOS: 7 days      Calvert Cantor, MD [facility] Pager: [email or web address] 02/21/2020, 2:34 PM

## 2020-02-22 DIAGNOSIS — J9601 Acute respiratory failure with hypoxia: Secondary | ICD-10-CM

## 2020-02-22 LAB — RESP PANEL BY RT-PCR (FLU A&B, COVID) ARPGX2
Influenza A by PCR: NEGATIVE
Influenza B by PCR: NEGATIVE
SARS Coronavirus 2 by RT PCR: NEGATIVE

## 2020-02-22 NOTE — Progress Notes (Signed)
[place] 5I43 [facility]) Hospitalized hospice patient visit.  [name] is a hospice patient admitted to hospice services on 12/20 with a terminal diagnosis of hypertensive heart disease. On 12/21, patient became SOB, hypoxic, sats 83% and bradycardic. Her facility activated EMS, patient was transported to [facility] ED. Patient was admitted to [facility] on 02/14/2020 with a diagnosis of acute respiratory failure with hypoxia and flash pulmonary edema. Per Dr. Kirt Boys with [facility] this is a related hospital admission.   VS: 97.4, 142/76, 75, 24, 92% 5Lnc  I/O: 243/250 No new labs today  Problem List: - acute on chronic heart failure with respiratory failure - diuresed last night, portable chest last night revealed pleural effusions, IV fluids stopped - urosepsis and obstructing nephrolithiasis - percutaneous nephrostomy tube in place, on ivanz IV - Poor oral intake- refusing food today - NSTEMI during this admission  Need for continued GIP - Remains inpatient appropriate because:Hemodynamically unstable  Per MD- complete antibiotics today and as long as she is stable for transport, return to [employer].   GOC: return to [place] for EOL care with hospice support after completion of treatment for urosepsis and congestive heart failure D/C planning: return to [place], they would like for her to return now and finish her course of IV abx, however her NOK Cecille Po) did not want her to return to her facility until she was medically optimized Family: Spoke with [name], her minister from her church.   IDT: Hospice team updated.  Please use [facility] for ambulance transport.   Elsie Saas, RN, [facility]   [facility] Liaison (listed on AMION under Hospice/[phone]

## 2020-02-22 NOTE — Progress Notes (Signed)
[facility] PROGRESS NOTE    Progress Note  [name]  ZCH:[id number] DOB: 1924-10-11 DOA: 02/14/2020 PCP: Jarrett Soho, PA-C     Brief Narrative:   [name] 86 [place], peripheral vascular disease, carotid artery stenosiss/pendarterectomy,who isbedbound andpresents with complaints of trouble breathing. She was admitted to the hospital from 02/03/20 through 02/09/20 for sepsis related to Klebsiella UTI complicated by an obstructing renal calculus and bacteremia for which she received a nephrostomy tube. She also was found to have an NSTEMI during this admission. Palliative care discussions were started and hospice was initiated at the facility in which she was residing.   Enroute with EMS patient's O2 saturations were reportedly 84% on room air and improved to 100% on 4 L of nasal cannula oxygen.  CXR >Findings may reflect CHF/volume overload with cardiomegaly, interstitial and alveolar edema   Assessment/Plan:   Acute exacerbation of CHF (congestive heart failure) (HCC) Treated with IV Lasix and has improved 2D echo showed an EF of 55%. The family decided to hold to transition to hospice care. Continue use Lasix as needed as needed.  Recent sepsis with Klebsiella bacteremia and UTI: She still has percutaneous nephrostomy tube now on hospice. Her last dose of Invanz today then we can discharge her with hospice care.  Continue disease stage IIIb: Appears to be at baseline.  Poor oral intake: Eating less than 50% of meals.   DVT prophylaxis: lovexno Family Communication:Daughter Status is: Inpatient  Remains inpatient appropriate because:Hemodynamically unstable   Dispo: The patient is from: SNF              Anticipated d/c is to: SNF              Anticipated d/c date is: 1 day              Patient currently is not medically stable to d/c.        Code  Status:     Code Status Orders  (From admission, onward)         Start     Ordered   02/14/20 0737  Do not attempt resuscitation (DNR)  Continuous       Question Answer Comment  In the event of cardiac or respiratory ARREST Do not call a "code blue"   In the event of cardiac or respiratory ARREST Do not perform Intubation, CPR, defibrillation or ACLS   In the event of cardiac or respiratory ARREST Use medication by any route, position, wound care, and other measures to relive pain and suffering. May use oxygen, suction and manual treatment of airway obstruction as needed for comfort.      02/14/20 0738        Code Status History    Date Active Date Inactive Code Status Order ID Comments User Context   02/14/2020 0719 02/14/2020 0738 DNR [id number]  Clydie Braun, MD ED   02/04/2020 1638 02/09/2020 1912 DNR [id number]  Lynnell Catalan, MD Inpatient   02/03/2020 2345 02/04/2020 1638 Full Code [id number]  Kirtland Bouchard, MD ED   01/14/2018 2245 01/20/2018 2048 Full Code [id number]  Tonye Royalty, DO Inpatient   12/15/2017 2342 12/18/2017 1949 Full Code [id number]  Rometta Emery, MD ED   Advance Care Planning Activity        IV Access:    Peripheral IV   Procedures and diagnostic studies:   No results found.   Medical Consultants:  None.  Anti-Infectives:   Invaz  Subjective:    [name] none verbal  Objective:    Vitals:   02/22/20 0446 02/22/20 0846 02/22/20 1049 02/22/20 1120  BP: 133/61  (!) 154/73 (!) 142/76  Pulse: 74  77 75  Resp: 20   (!) 24  Temp: (!) 97.3 F (36.3 C)   (!) 97.4 F (36.3 C)  TempSrc: Oral   Oral  SpO2: 92%     Weight:  65.8 kg    Height:       SpO2: 92 % O2 Flow Rate (L/min): 5 L/min   Intake/Output Summary (Last 24 hours) at 02/22/2020 1154 Last data filed at 02/22/2020 1100 Gross per 24 hour  Intake 306 ml  Output 375 ml  Net -69 ml   Filed Weights   02/19/20 0500 02/20/20 0258 02/22/20 0846   Weight: 70.6 kg 66.7 kg 65.8 kg    Exam: General exam: In no acute distress. Respiratory system: Good air movement and clear to auscultation. Cardiovascular system: S1 & S2 heard, RRR. No JVD. Gastrointestinal system: Abdomen is nondistended, soft and nontender.  Extremities: No pedal edema. Skin: No rashes, lesions or ulcers  Data Reviewed:    Labs: Basic Metabolic Panel: Recent Labs  Lab 02/16/20 0257 02/17/20 0143 02/18/20 0232 02/19/20 0430 02/21/20 0932  NA 140 137 138 140 147*  K 3.6 4.9 3.9 4.5 4.2  CL 101 98 99 100 110  CO2 GLUCOSE 85 95 106* 104* 120*  BUN 28* 30* 38* 40* 39*  CREATININE 1.25* 1.32* 1.55* 1.69* 1.39*  CALCIUM 8.6* 8.9 8.7* 8.7* 8.5*   GFR Estimated Creatinine Clearance: 21.4 mL/min (A) (by C-G formula based on SCr of 1.39 mg/dL (H)). Liver Function Tests: No results for input(s): AST, ALT, ALKPHOS, BILITOT, PROT, ALBUMIN in the last 168 hours. No results for input(s): LIPASE, AMYLASE in the last 168 hours. No results for input(s): AMMONIA in the last 168 hours. Coagulation profile No results for input(s): INR, PROTIME in the last 168 hours. COVID-19 Labs  No results for input(s): DDIMER, FERRITIN, LDH, CRP in the last 72 hours.  Lab Results  Component Value Date   SARSCOV2NAA NEGATIVE 02/14/2020   SARSCOV2NAA NEGATIVE 02/09/2020   SARSCOV2NAA NEGATIVE 02/03/2020    CBC: Recent Labs  Lab 02/18/20 2056 02/21/20 0932  WBC 17.0* 11.2*  NEUTROABS 14.6*  --   HGB 11.0* 10.9*  HCT 35.9* 37.4  MCV 94.2 98.9  PLT 337 360   Cardiac Enzymes: No results for input(s): CKTOTAL, CKMB, CKMBINDEX, TROPONINI in the last 168 hours. BNP (last 3 results) No results for input(s): PROBNP in the last 8760 hours. CBG: No results for input(s): GLUCAP in the last 168 hours. D-Dimer: No results for input(s): DDIMER in the last 72 hours. Hgb A1c: No results for input(s): HGBA1C in the last 72 hours. Lipid Profile: No results for  input(s): CHOL, HDL, LDLCALC, TRIG, CHOLHDL, LDLDIRECT in the last 72 hours. Thyroid function studies: No results for input(s): TSH, T4TOTAL, T3FREE, THYROIDAB in the last 72 hours.  Invalid input(s): FREET3 Anemia work up: No results for input(s): VITAMINB12, FOLATE, FERRITIN, TIBC, IRON, RETICCTPCT in the last 72 hours. Sepsis Labs: Recent Labs  Lab 02/18/20 2056 02/21/20 0932  PROCALCITON 0.37  --   WBC 17.0* 11.2*  LATICACIDVEN 1.3  --    Microbiology Recent Results (from the past 240 hour(s))  Resp Panel by RT-PCR (Flu A&B, Covid) Nasopharyngeal Swab  Status: None   Collection Time: 02/14/20  4:22 AM   Specimen: Nasopharyngeal Swab; Nasopharyngeal(NP) swabs in vial transport medium  Result Value Ref Range Status   SARS Coronavirus 2 by RT PCR NEGATIVE NEGATIVE Final    Comment: (NOTE) SARS-CoV-2 target nucleic acids are NOT DETECTED.  The SARS-CoV-2 RNA is generally detectable in upper respiratory specimens during the acute phase of infection. The lowest concentration of SARS-CoV-2 viral copies this assay can detect is 138 copies/mL. A negative result does not preclude SARS-Cov-2 infection and should not be used as the sole basis for treatment or other patient management decisions. A negative result may occur with  improper specimen collection/handling, submission of specimen other than nasopharyngeal swab, presence of viral mutation(s) within the areas targeted by this assay, and inadequate number of viral copies(<138 copies/mL). A negative result must be combined with clinical observations, patient history, and epidemiological information. The expected result is Negative.  Fact Sheet for Patients:  BloggerCourse.comhttps://www.fda.gov/media/152166/download  Fact Sheet for Healthcare Providers:  SeriousBroker.ithttps://www.fda.gov/media/152162/download  This test is no t yet approved or cleared by the [place] FDA and  has been authorized for detection and/or diagnosis of SARS-CoV-2  by FDA under an Emergency Use Authorization (EUA). This EUA will remain  in effect (meaning this test can be used) for the duration of the COVID-19 declaration under Section 564(b)(1) of the Act, 21 U.S.C.section 360bbb-3(b)(1), unless the authorization is terminated  or revoked sooner.       Influenza A by PCR NEGATIVE NEGATIVE Final   Influenza B by PCR NEGATIVE NEGATIVE Final    Comment: (NOTE) The Xpert Xpress SARS-CoV-2/FLU/RSV plus assay is intended as an aid in the diagnosis of influenza from Nasopharyngeal swab specimens and should not be used as a sole basis for treatment. Nasal washings and aspirates are unacceptable for Xpert Xpress SARS-CoV-2/FLU/RSV testing.  Fact Sheet for Patients: BloggerCourse.comhttps://www.fda.gov/media/152166/download  Fact Sheet for Healthcare Providers: SeriousBroker.ithttps://www.fda.gov/media/152162/download  This test is not yet approved or cleared by the [place] FDA and has been authorized for detection and/or diagnosis of SARS-CoV-2 by FDA under an Emergency Use Authorization (EUA). This EUA will remain in effect (meaning this test can be used) for the duration of the COVID-19 declaration under Section 564(b)(1) of the Act, 21 U.S.C. section 360bbb-3(b)(1), unless the authorization is terminated or revoked.  Performed at [facility], [address] [place], [place] [address]   Culture, blood (routine x 2)     Status: None   Collection Time: 02/14/20 11:03 AM   Specimen: BLOOD RIGHT ARM  Result Value Ref Range Status   Specimen Description BLOOD RIGHT ARM  Final   Special Requests   Final    BOTTLES DRAWN AEROBIC AND ANAEROBIC Blood Culture adequate volume   Culture   Final    NO GROWTH 5 DAYS Performed at [facility], [address] [place], [place] [address]    Report Status 02/19/2020 FINAL  Final  Culture, blood (routine x 2)     Status: Abnormal   Collection Time: 02/14/20  6:21 PM   Specimen: BLOOD  Result Value Ref Range Status    Specimen Description BLOOD BLOOD RIGHT FOREARM  Final   Special Requests   Final    BOTTLES DRAWN AEROBIC AND ANAEROBIC Blood Culture adequate volume   Culture  Setup Time   Final    GRAM NEGATIVE RODS ANAEROBIC BOTTLE ONLY Organism ID to follow CRITICAL RESULT CALLED TO, READ BACK BY AND VERIFIED WITH: Norva RiffleE. Sinclair  PharmD 10:25 02/16/20 ([email or web address]) Performed at [facility], [address], [place], [place] [address]    Culture (A)  Final    ESCHERICHIA COLI Confirmed Extended Spectrum Beta-Lactamase Producer (ESBL).  In bloodstream infections from ESBL organisms, carbapenems are preferred over piperacillin/tazobactam. They are shown to have a lower risk of mortality.    Report Status 02/18/2020 FINAL  Final   Organism ID, Bacteria ESCHERICHIA COLI  Final      Susceptibility   Escherichia coli - MIC*    AMPICILLIN >=32 RESISTANT Resistant     CEFAZOLIN >=64 RESISTANT Resistant     CEFEPIME >=32 RESISTANT Resistant     CEFTAZIDIME RESISTANT Resistant     CEFTRIAXONE >=64 RESISTANT Resistant     CIPROFLOXACIN >=4 RESISTANT Resistant     GENTAMICIN >=16 RESISTANT Resistant     IMIPENEM <=0.25 SENSITIVE Sensitive     TRIMETH/SULFA <=20 SENSITIVE Sensitive     AMPICILLIN/SULBACTAM >=32 RESISTANT Resistant     PIP/TAZO 8 SENSITIVE Sensitive     * ESCHERICHIA COLI  Blood Culture ID Panel (Reflexed)     Status: Abnormal   Collection Time: 02/14/20  6:21 PM  Result Value Ref Range Status   Enterococcus faecalis NOT DETECTED NOT DETECTED Final   Enterococcus Faecium NOT DETECTED NOT DETECTED Final   Listeria monocytogenes NOT DETECTED NOT DETECTED Final   Staphylococcus species NOT DETECTED NOT DETECTED Final   Staphylococcus aureus (BCID) NOT DETECTED NOT DETECTED Final   Staphylococcus epidermidis NOT DETECTED NOT DETECTED Final   Staphylococcus lugdunensis NOT DETECTED NOT DETECTED Final   Streptococcus species NOT DETECTED NOT DETECTED Final   Streptococcus agalactiae NOT  DETECTED NOT DETECTED Final   Streptococcus pneumoniae NOT DETECTED NOT DETECTED Final   Streptococcus pyogenes NOT DETECTED NOT DETECTED Final   A.calcoaceticus-baumannii NOT DETECTED NOT DETECTED Final   Bacteroides fragilis NOT DETECTED NOT DETECTED Final   Enterobacterales DETECTED (A) NOT DETECTED Final    Comment: Enterobacterales represent a large order of gram negative bacteria, not a single organism. CRITICAL RESULT CALLED TO, READ BACK BY AND VERIFIED WITH: Norva Riffle PharmD 10:25 02/16/20 (wilsonm)    Enterobacter cloacae complex NOT DETECTED NOT DETECTED Final   Escherichia coli DETECTED (A) NOT DETECTED Final    Comment: CRITICAL RESULT CALLED TO, READ BACK BY AND VERIFIED WITH: Norva Riffle PharmD 10:25 02/16/20 (wilsonm)    Klebsiella aerogenes NOT DETECTED NOT DETECTED Final   Klebsiella oxytoca NOT DETECTED NOT DETECTED Final   Klebsiella pneumoniae NOT DETECTED NOT DETECTED Final   Proteus species NOT DETECTED NOT DETECTED Final   Salmonella species NOT DETECTED NOT DETECTED Final   Serratia marcescens NOT DETECTED NOT DETECTED Final   Haemophilus influenzae NOT DETECTED NOT DETECTED Final   Neisseria meningitidis NOT DETECTED NOT DETECTED Final   Pseudomonas aeruginosa NOT DETECTED NOT DETECTED Final   Stenotrophomonas maltophilia NOT DETECTED NOT DETECTED Final   Candida albicans NOT DETECTED NOT DETECTED Final   Candida auris NOT DETECTED NOT DETECTED Final   Candida glabrata NOT DETECTED NOT DETECTED Final   Candida krusei NOT DETECTED NOT DETECTED Final   Candida parapsilosis NOT DETECTED NOT DETECTED Final   Candida tropicalis NOT DETECTED NOT DETECTED Final   Cryptococcus neoformans/gattii NOT DETECTED NOT DETECTED Final   CTX-M ESBL DETECTED (A) NOT DETECTED Final    Comment: CRITICAL RESULT CALLED TO, READ BACK BY AND VERIFIED WITH: Norva Riffle PharmD 10:25 02/16/20 (wilsonm) (NOTE) Extended spectrum beta-lactamase detected. Recommend a carbapenem  as initial therapy.  Carbapenem resistance IMP NOT DETECTED NOT DETECTED Final   Carbapenem resistance KPC NOT DETECTED NOT DETECTED Final   Carbapenem resistance NDM NOT DETECTED NOT DETECTED Final   Carbapenem resist OXA 48 LIKE NOT DETECTED NOT DETECTED Final   Carbapenem resistance VIM NOT DETECTED NOT DETECTED Final    Comment: Performed at [facility], [address] [place], [place] [address]  Culture, blood (routine x 2)     Status: None (Preliminary result)   Collection Time: 02/18/20  8:39 PM   Specimen: BLOOD  Result Value Ref Range Status   Specimen Description BLOOD RIGHT ARM  Final   Special Requests   Final    BOTTLES DRAWN AEROBIC AND ANAEROBIC Blood Culture adequate volume   Culture   Final    NO GROWTH 4 DAYS Performed at [facility], [address], [place], [place] [address]    Report Status PENDING  Incomplete  Culture, blood (routine x 2)     Status: None (Preliminary result)   Collection Time: 02/18/20  8:58 PM   Specimen: BLOOD LEFT HAND  Result Value Ref Range Status   Specimen Description BLOOD LEFT HAND  Final   Special Requests   Final    BOTTLES DRAWN AEROBIC AND ANAEROBIC Blood Culture adequate volume   Culture   Final    NO GROWTH 4 DAYS Performed at [facility], [address], [place], [place] [address]    Report Status PENDING  Incomplete     Medications:   . aspirin  81 mg Oral Daily  . atenolol  25 mg Oral Daily  . atorvastatin  20 mg Oral QHS  . clopidogrel  75 mg Oral Daily  . docusate sodium  100 mg Oral BID  . enoxaparin (LOVENOX) injection  30 mg Subcutaneous Q24H  . feeding supplement  237 mL Oral TID BM  . loratadine  10 mg Oral Daily  . melatonin  6 mg Oral QHS  . multivitamin with minerals  1 tablet Oral Daily  . Muscle Rub  1 application Topical BID  . polyvinyl alcohol  1 drop Both Eyes TID  . pramipexole  0.125 mg Oral QHS  . sodium chloride flush  3 mL Intravenous Q12H   Continuous  Infusions: . sodium chloride    . ertapenem Stopped (02/21/20 1530)      LOS: 8 days   Marinda Elk  [facility]  02/22/2020, 11:54 AM

## 2020-02-23 DIAGNOSIS — J810 Acute pulmonary edema: Secondary | ICD-10-CM

## 2020-02-23 LAB — CULTURE, BLOOD (ROUTINE X 2)
Culture: NO GROWTH
Culture: NO GROWTH
Special Requests: ADEQUATE
Special Requests: ADEQUATE

## 2020-02-23 NOTE — [place] FL2 (Signed)
[place] MEDICAID FL2 LEVEL OF CARE SCREENING TOOL     IDENTIFICATION  Patient Name: [name] Birthdate: 05/10/1924 Sex: female Admission Date (Current Location): 02/14/2020  [facility] and [place] Number:  Producer, television/film/video and Address:  [facility], [address] [place], [place] [address]      Provider Number: [id number]  Attending Physician Name and Address:  Marinda Elk, MD  Relative Name and Phone Number:  groce,kelly Other [phone]  [phone]    Current Level of Care: Hospital Recommended Level of Care: Skilled Nursing Facility Prior Approval Number:    Date Approved/Denied:   PASRR Number: [id number]  Discharge Plan: SNF    Current Diagnoses: Patient Active Problem List   Diagnosis Date Noted  . Acute exacerbation of CHF (congestive heart failure) (HCC) 02/14/2020  . Leukocytosis 02/14/2020  . SIRS (systemic inflammatory response syndrome) (HCC) 02/14/2020  . CKD (chronic kidney disease), stage III (HCC) 02/14/2020  . Normocytic anemia 02/14/2020  . DNR (do not resuscitate) 02/14/2020  . Hospice care patient 02/09/2020  . Palliative care patient 02/09/2020  . Malnutrition of moderate degree 02/06/2020  . Severe sepsis (HCC) 02/03/2020  . Stage II pressure ulcer of sacral region (HCC) 01/15/2018  . AKI (acute kidney injury) (HCC) 01/15/2018  . Sepsis (HCC) 01/14/2018  . Mechanical Fall 12/15/2017  . E coli UTI (urinary tract infection) 12/15/2017  . ARF (acute renal failure) (HCC) 12/15/2017  . Hypertension 12/15/2017  . Hypothyroidism 12/15/2017  . Hyperlipidemia 12/15/2017  . Occlusion and stenosis of carotid artery without mention of cerebral infarction 06/09/2011    Orientation RESPIRATION BLADDER Height & Weight     Self  Normal Incontinent,External catheter Weight: 146 lb 13.2 oz (66.6 kg) Height:  5' (152.4 cm)  BEHAVIORAL SYMPTOMS/MOOD NEUROLOGICAL BOWEL NUTRITION STATUS      Incontinent Diet  (see d/c summary)  AMBULATORY STATUS COMMUNICATION OF NEEDS Skin   Total Care Verbally Other (Comment) (catheter site; moisture associated skin damage)                       Personal Care Assistance Level of Assistance  Bathing,Feeding,Dressing Bathing Assistance: Maximum assistance Feeding assistance: Maximum assistance Dressing Assistance: Maximum assistance     Functional Limitations Info  Sight,Hearing,Speech Sight Info: Adequate Hearing Info: Adequate Speech Info: Adequate    SPECIAL CARE FACTORS FREQUENCY  PT (By licensed PT),OT (By licensed OT)     PT Frequency: 5x/week OT Frequency: 5x/week            Contractures Contractures Info: Not present    Additional Factors Info  Code Status,Allergies,Isolation Precautions Code Status Info: DNR Allergies Info: NKA     Isolation Precautions Info: ESBL AIr/Con pre     Current Medications (02/23/2020):  This is the current hospital active medication list Current Facility-Administered Medications  Medication Dose Route Frequency Provider Last Rate Last Admin  . 0.9 %  sodium chloride infusion  250 mL Intravenous PRN Madelyn Flavors A, MD      . acetaminophen (TYLENOL) tablet 650 mg  650 mg Oral Q6H PRN Zierle-Ghosh, Asia B, DO   650 mg at 02/18/20 2215  . aspirin chewable tablet 81 mg  81 mg Oral Daily Smith, Rondell A, MD   81 mg at 02/22/20 1047  . atenolol (TENORMIN) tablet 25 mg  25 mg Oral Daily Smith, Rondell A, MD   25 mg at 02/22/20 1049  . atorvastatin (LIPITOR) tablet 20 mg  20 mg  Oral QHS Madelyn Flavors A, MD   20 mg at 02/22/20 2154  . bisacodyl (DULCOLAX) suppository 10 mg  10 mg Rectal Daily PRN Madelyn Flavors A, MD      . clopidogrel (PLAVIX) tablet 75 mg  75 mg Oral Daily Smith, Rondell A, MD   75 mg at 02/22/20 1047  . docusate sodium (COLACE) capsule 100 mg  100 mg Oral BID Madelyn Flavors A, MD   100 mg at 02/22/20 2154  . enoxaparin (LOVENOX) injection 30 mg  30 mg Subcutaneous Q24H Katrinka Blazing, Rondell  A, MD   30 mg at 02/22/20 0846  . feeding supplement (ENSURE ENLIVE / ENSURE PLUS) liquid 237 mL  237 mL Oral TID BM Calvert Cantor, MD   237 mL at 02/22/20 2153  . loratadine (CLARITIN) tablet 10 mg  10 mg Oral Daily Katrinka Blazing, Rondell A, MD   10 mg at 02/22/20 1048  . melatonin tablet 6 mg  6 mg Oral QHS Madelyn Flavors A, MD   6 mg at 02/22/20 2154  . multivitamin with minerals tablet 1 tablet  1 tablet Oral Daily Calvert Cantor, MD   1 tablet at 02/22/20 1047  . Muscle Rub CREA 1 application  1 application Topical BID Clydie Braun, MD   1 application at 02/22/20 2154  . nitroGLYCERIN (NITROSTAT) SL tablet 0.4 mg  0.4 mg Sublingual Q5 Min x 3 PRN Madelyn Flavors A, MD      . ondansetron (ZOFRAN) injection 4 mg  4 mg Intravenous Q6H PRN Madelyn Flavors A, MD   4 mg at 02/16/20 2121  . polyethylene glycol (MIRALAX / GLYCOLAX) packet 17 g  17 g Oral Daily PRN Smith, Rondell A, MD      . polyvinyl alcohol (LIQUIFILM TEARS) 1.4 % ophthalmic solution 1 drop  1 drop Both Eyes TID Madelyn Flavors A, MD   1 drop at 02/22/20 2154  . pramipexole (MIRAPEX) tablet 0.125 mg  0.125 mg Oral QHS Smith, Rondell A, MD   0.125 mg at 02/22/20 2154  . sodium chloride flush (NS) 0.9 % injection 3 mL  3 mL Intravenous Q12H Smith, Rondell A, MD   3 mL at 02/22/20 2154  . sodium chloride flush (NS) 0.9 % injection 3 mL  3 mL Intravenous PRN Madelyn Flavors A, MD      . traMADol (ULTRAM) tablet 50 mg  50 mg Oral Q12H PRN Clydie Braun, MD   50 mg at 02/19/20 2115     Discharge Medications: Please see discharge summary for a list of discharge medications.  Relevant Imaging Results:  Relevant Lab Results:   Additional Information SSN: [id number] [place], [place]

## 2020-02-23 NOTE — Progress Notes (Signed)
D/C instructions printed and placed in packet at nurse's station. Tele removed.

## 2020-02-23 NOTE — TOC Transition Note (Signed)
Transition of Care [facility]) - CM/SW Discharge Note   Patient Details  Name: [name] MRN: [id number] Date of Birth: [birth year]  [facility] CM/SW Contact:  Erin Sons, LCSW Phone Number: 02/23/2020, 12:28 PM   Clinical Narrative:     Patient will DC to: [place] Anticipated DC date: 02/23/20 Family notified: [name] Transport by: Alyssa Grove   Per MD patient ready for DC to [facility] . RN, patient, patient's family, and facility notified of DC. Discharge Summary and FL2 sent to facility. RN to call report prior to discharge [phone]). DC packet on chart. Ambulance transport requested for patient.   CSW will sign off for now as social work intervention is no longer needed. Please consult [place] again if new needs arise.   Final next level of care: Skilled Nursing Facility Barriers to Discharge: No Barriers Identified   Patient Goals and CMS Choice        Discharge Placement              Patient chooses bed at:  [facility]) Patient to be transferred to facility by: [facility] Name of family member notified: [name] Patient and family notified of of transfer: 02/23/20  Discharge Plan and Services                                     Social Determinants of Health (SDOH) Interventions     Readmission Risk Interventions No flowsheet data found.

## 2020-02-23 NOTE — Discharge Summary (Signed)
Physician Discharge Summary  [name] WUJ:[id number] DOB: 04-10-1924 DOA: 02/14/2020  PCP: Marda Stalker, PA-C  Admit date: 02/14/2020 Discharge date: 02/23/2020  Admitted From: SNF Disposition:  SNF  Recommendations for Outpatient Follow-up:  1. Follow up with PCP in 1-2 weeks 2. She will return to [place] with hospice care.  Home Health:No Equipment/Devices:None  Discharge Condition:Hospice CODE STATUS:DNR Diet recommendation: Heart Healthy   Brief/Interim Summary: 86 y.o. female sent from [place] history significant ofhypertension, peripheral vascular disease, carotid artery stenosiss/pendarterectomy,who isbedbound andpresents with complaints of trouble breathing.  Discharge Diagnoses:  Principal Problem:   Acute exacerbation of CHF (congestive heart failure) (HCC) Active Problems:   Hypothyroidism   Malnutrition of moderate degree   Leukocytosis   SIRS (systemic inflammatory response syndrome) (HCC)   CKD (chronic kidney disease), stage III (HCC)   Normocytic anemia   DNR (do not resuscitate) Acute respiratory failure with hypoxia secondarily to acute on chronic diastolic heart failure She was started on IV Lasix for 2 days with improvement in her saturations.  2D echo was done on 04/07/2019 that showed an EF of 55% with grade 1 diastolic heart failure. Palliative care  met with family and she was transitioned to hospice care as an outpatient.  Recent sepsis with Klebsiella bacteremia and UTI and obstructive nephrolithiasis: Percutaneous tube still in place, her white blood cell count was slightly elevated blood cultures grew gram-negative rod ESBL producing E. coli.  She was started empirically on IV antibiotics she completed her antibiotics of Invanz in house on 02/22/2020.  Chronic kidney disease stage IIIb: Creatinine remained at baseline.  Poor oral intake: Noted.  Discharge Instructions  Discharge Instructions    Diet  - low sodium heart healthy   Complete by: As directed    Diet general   Complete by: As directed    Increase activity slowly   Complete by: As directed    Increase activity slowly   Complete by: As directed    No wound care   Complete by: As directed    No wound care   Complete by: As directed      Allergies as of 02/23/2020   No Known Allergies     Medication List    STOP taking these medications   meloxicam 15 MG tablet Commonly known as: MOBIC     TAKE these medications   acetaminophen 325 MG tablet Commonly known as: TYLENOL Take 2 tablets (650 mg total) by mouth every 6 (six) hours as needed for mild pain or moderate pain (or Fever >/= 101).   aspirin 81 MG tablet Take 81 mg by mouth daily.   atenolol 25 MG tablet Commonly known as: TENORMIN Take 25 mg by mouth daily.   atorvastatin 20 MG tablet Commonly known as: LIPITOR Take 20 mg by mouth at bedtime.   Biofreeze 4 % Gel Generic drug: Menthol (Topical Analgesic) Apply 1 application topically in the morning and at bedtime. Apply to bilateral hands, wrists, and knees   bisacodyl 10 MG suppository Commonly known as: DULCOLAX Place 10 mg rectally daily as needed for moderate constipation.   CALTRATE 600 PLUS-VIT D PO Take 2 tablets by mouth daily.   cetirizine 10 MG tablet Commonly known as: ZYRTEC Take 10 mg by mouth daily.   clopidogrel 75 MG tablet Commonly known as: PLAVIX Take 1 tablet (75 mg total) by mouth daily.   docusate sodium 100 MG capsule Commonly known as: COLACE Take 100 mg by mouth 2 (two) times daily.  feeding supplement Liqd Take 237 mLs by mouth 2 (two) times daily between meals.   furosemide 40 MG tablet Commonly known as: Lasix Take 1 tablet (40 mg total) by mouth daily as needed for fluid or edema.   levothyroxine 75 MCG tablet Commonly known as: SYNTHROID Take 75 mcg by mouth daily before breakfast.   melatonin 3 MG Tabs tablet Take 6 mg by mouth at bedtime.    nitroGLYCERIN 0.4 MG SL tablet Commonly known as: NITROSTAT Place 0.4 mg under the tongue every 5 (five) minutes x 3 doses as needed for chest pain.   polyethylene glycol 17 g packet Commonly known as: MIRALAX / GLYCOLAX Take 17 g by mouth daily as needed for moderate constipation.   pramipexole 0.125 MG tablet Commonly known as: MIRAPEX Take 0.125 mg by mouth at bedtime.   Systane 0.4-0.3 % Gel ophthalmic gel Generic drug: Polyethyl Glycol-Propyl Glycol Place 1 application into both eyes in the morning, at noon, in the evening, and at bedtime.   traMADol 50 MG tablet Commonly known as: ULTRAM Take 1 tablet (50 mg total) by mouth every 4 (four) hours as needed for moderate pain.       No Known Allergies  Consultations:  None   Procedures/Studies: CT ABDOMEN PELVIS W CONTRAST  Result Date: 02/03/2020 CLINICAL DATA:  Abdominal pain, fever EXAM: CT ABDOMEN AND PELVIS WITH CONTRAST TECHNIQUE: Multidetector CT imaging of the abdomen and pelvis was performed using the standard protocol following bolus administration of intravenous contrast. CONTRAST:  2m OMNIPAQUE IOHEXOL 300 MG/ML  SOLN COMPARISON:  None. FINDINGS: Lower chest: Small left pleural effusion and trace right pleural effusion. Dependent bibasilar atelectasis. Cardiomegaly. Densely calcified mitral valve, aortic valve, visualized coronary arteries and aorta. Hepatobiliary: No focal hepatic abnormality. Gallbladder unremarkable. Pancreas: No focal abnormality or ductal dilatation. Spleen: Calcifications in the spleen likely due to old insult. Normal size. Adrenals/Urinary Tract: Large left renal pelvic stone measures up to 1.6 cm. Mild hydronephrosis associated with this stone. Nonobstructing midpole right renal stone. Other nonobstructing stones on the left. No ureteral stones. Adrenal glands and urinary bladder unremarkable. Upper pole cyst on the right measures 4.7 cm. Stomach/Bowel: Sigmoid diverticulosis. No active  diverticulitis. Scattered diverticula also noted in the transverse colon and right colon. Moderate stool burden in the rectosigmoid colon. Stomach and small bowel decompressed, unremarkable. Vascular/Lymphatic: Aortoiliac atherosclerosis. No evidence of aneurysm or adenopathy. Reproductive: Calcified fibroids within the uterus. No adnexal mass. Other: No free fluid or free air. Musculoskeletal: No acute bony abnormality. IMPRESSION: Bilateral nephrolithiasis. 16 mm left renal pelvic stone with mild left hydronephrosis. Aortoiliac atherosclerosis.  Coronary artery disease. Colonic diverticulosis.  No active diverticulitis. Moderate stool in the rectosigmoid colon. Small left pleural effusion and trace right pleural effusion. Electronically Signed   By: KRolm BaptiseM.D.   On: 02/03/2020 20:05   DG CHEST PORT 1 VIEW  Result Date: 02/19/2020 CLINICAL DATA:  Shortness of breath EXAM: PORTABLE CHEST 1 VIEW COMPARISON:  February 14, 2020 FINDINGS: There is cardiomegaly. There are bilateral pleural effusions which have increased in size from prior study. There is vascular congestion with interstitial edema. There are bibasilar airspace opacities favored to represent atelectasis. There is no pneumothorax. No acute osseous abnormality. Aortic calcifications are noted. IMPRESSION: Findings are again suggestive of congestive heart failure. There are growing bilateral pleural effusions. Electronically Signed   By: CConstance HolsterM.D.   On: 02/19/2020 21:58   DG Chest Port 1 View  Result Date: 02/14/2020 CLINICAL DATA:  Shortness of breath EXAM: PORTABLE CHEST 1 VIEW COMPARISON:  Radiograph 02/03/2020 FINDINGS: Prior imaging demonstrates a background of some coarsened interstitial and bronchitic features though overall, interstitial opacities have increased from prior with more hazy attenuation in the perihilar region and lower lungs. Vascular redistribution is noted as well. Small bilateral effusions. No  pneumothorax. The cardiac silhouette is likely enlarged with dense calcification of mitral annulus. Aortic atherosclerotic calcification is noted as well. Remaining cardiomediastinal contours are unremarkable. No acute osseous or soft tissue abnormality. Degenerative changes are present in the imaged spine and shoulders. High-riding appearance of the bilateral humeral heads likely reflecting underlying rotator cuff insufficiency. Telemetry leads overlie the chest. IMPRESSION: Findings may reflect CHF/volume overload with cardiomegaly, interstitial and alveolar edema and small bilateral effusions. Background of chronic coarsened interstitial and bronchitic features. Electronically Signed   By: Lovena Le M.D.   On: 02/14/2020 04:16   DG Chest Port 1 View  Result Date: 02/03/2020 CLINICAL DATA:  Questionable sepsis. EXAM: PORTABLE CHEST 1 VIEW COMPARISON:  January 14, 2018 FINDINGS: The lungs are hyperinflated. There is no evidence of acute infiltrate, pleural effusion or pneumothorax. The cardiac silhouette is mildly enlarged and unchanged in size. There is marked severity calcification of the aortic arch. Degenerative changes seen throughout the thoracic spine. IMPRESSION: Stable exam without acute or active cardiopulmonary disease. Electronically Signed   By: Virgina Norfolk M.D.   On: 02/03/2020 18:21   ECHOCARDIOGRAM COMPLETE  Result Date: 02/04/2020    ECHOCARDIOGRAM REPORT   Patient Name:   [name] Date of Exam: 02/04/2020 Medical Rec #:  [id number]  Height:       64.0 in Accession #:    [id number] Weight:       127.0 lb Date of Birth:  May 05, 1924  BSA:          1.613 m Patient Age:    30 years   BP:           110/49 mmHg Patient Gender: F          HR:           64 bpm. Exam Location:  Inpatient Procedure: 2D Echo, Cardiac Doppler, Color Doppler and Intracardiac            Opacification Agent Indications:    Elevated troponin  History:        Patient has no prior history of Echocardiogram  examinations.                 Risk Factors:Hypertension. E. coli UTI.  Sonographer:    Clayton Lefort RDCS (AE) Referring Phys: [place]  1. Left ventricular ejection fraction, by estimation, is 55 to 60%. The left ventricle has normal function. The left ventricle has no regional wall motion abnormalities. Left ventricular diastolic parameters are indeterminate.  2. Right ventricular systolic function is normal. The right ventricular size is normal.  3. Left atrial size was mildly dilated.  4. The mitral valve is degenerative. Moderate mitral valve regurgitation. No evidence of mitral stenosis. Severe mitral annular calcification.  5. Tricuspid valve regurgitation is moderate.  6. The aortic valve is tricuspid. There is moderate calcification of the aortic valve. There is moderate thickening of the aortic valve. Aortic valve regurgitation is mild. Moderate aortic valve stenosis.  7. The inferior vena cava is normal in size with greater than 50% respiratory variability, suggesting right atrial pressure of 3 mmHg. FINDINGS  Left Ventricle: Left ventricular ejection fraction, by estimation, is 55  to 60%. The left ventricle has normal function. The left ventricle has no regional wall motion abnormalities. Definity contrast agent was given IV to delineate the left ventricular  endocardial borders. The left ventricular internal cavity size was normal in size. There is no left ventricular hypertrophy. Left ventricular diastolic parameters are indeterminate. Right Ventricle: The right ventricular size is normal. No increase in right ventricular wall thickness. Right ventricular systolic function is normal. Left Atrium: Left atrial size was mildly dilated. Right Atrium: Right atrial size was normal in size. Pericardium: There is no evidence of pericardial effusion. Mitral Valve: The mitral valve is degenerative in appearance. There is severe thickening of the mitral valve leaflet(s). There is  severe calcification of the mitral valve leaflet(s). Severe mitral annular calcification. Moderate mitral valve regurgitation. No evidence of mitral valve stenosis. MV peak gradient, 12.2 mmHg. The mean mitral valve gradient is 4.0 mmHg. Tricuspid Valve: The tricuspid valve is normal in structure. Tricuspid valve regurgitation is moderate . No evidence of tricuspid stenosis. Aortic Valve: The aortic valve is tricuspid. There is moderate calcification of the aortic valve. There is moderate thickening of the aortic valve. There is moderate aortic valve annular calcification. Aortic valve regurgitation is mild. Aortic regurgitation PHT measures 406 msec. Moderate aortic stenosis is present. Aortic valve mean gradient measures 29.0 mmHg. Aortic valve peak gradient measures 49.3 mmHg. Aortic valve area, by VTI measures 0.45 cm. Pulmonic Valve: The pulmonic valve was normal in structure. Pulmonic valve regurgitation is not visualized. No evidence of pulmonic stenosis. Aorta: The aortic root is normal in size and structure. Venous: The inferior vena cava is normal in size with greater than 50% respiratory variability, suggesting right atrial pressure of 3 mmHg. IAS/Shunts: No atrial level shunt detected by color flow Doppler.  LEFT VENTRICLE PLAX 2D LVIDd:         4.50 cm  Diastology LVIDs:         3.10 cm  LV e' medial:    4.12 cm/s LV PW:         1.70 cm  LV E/e' medial:  39.3 LV IVS:        1.10 cm  LV e' lateral:   6.25 cm/s LVOT diam:     1.70 cm  LV E/e' lateral: 25.9 LV SV:         46 LV SV Index:   28 LVOT Area:     2.27 cm  RIGHT VENTRICLE            IVC RV Basal diam:  3.20 cm    IVC diam: 1.90 cm RV S prime:     8.86 cm/s TAPSE (M-mode): 1.9 cm LEFT ATRIUM            Index       RIGHT ATRIUM           Index LA diam:      4.20 cm  2.60 cm/m  RA Area:     15.30 cm LA Vol (A4C): 104.0 ml 64.48 ml/m RA Volume:   38.20 ml  23.68 ml/m  AORTIC VALVE AV Area (Vmax):    0.61 cm AV Area (Vmean):   0.52 cm AV Area  (VTI):     0.45 cm AV Vmax:           351.00 cm/s AV Vmean:          247.000 cm/s AV VTI:            1.030 m AV Peak  Grad:      49.3 mmHg AV Mean Grad:      29.0 mmHg LVOT Vmax:         94.00 cm/s LVOT Vmean:        57.100 cm/s LVOT VTI:          0.202 m LVOT/AV VTI ratio: 0.20 AI PHT:            406 msec  AORTA Ao Root diam: 3.20 cm Ao Asc diam:  2.80 cm MITRAL VALVE                TRICUSPID VALVE MV Area (PHT): 3.42 cm     TR Peak grad:   41.5 mmHg MV Peak grad:  12.2 mmHg    TR Vmax:        322.00 cm/s MV Mean grad:  4.0 mmHg MV Vmax:       1.75 m/s     SHUNTS MV Vmean:      91.8 cm/s    Systemic VTI:  0.20 m MV Decel Time: 222 msec     Systemic Diam: 1.70 cm MV E velocity: 162.00 cm/s MV A velocity: 131.00 cm/s MV E/A ratio:  1.24 Jenkins Rouge MD Electronically signed by Jenkins Rouge MD Signature Date/Time: 02/04/2020/11:44:46 AM    Final    IR NEPHROSTOMY PLACEMENT LEFT  Result Date: 02/04/2020 INDICATION: 27m left UPJ stone, mild hydronephrosis, concern for urosepsis EXAM: ULTRASOUND AND FLUOROSCOPIC 10 FRENCH LEFT NEPHROSTOMY COMPARISON:  None. MEDICATIONS: 02/03/2020; The antibiotic was administered in an appropriate time frame prior to skin puncture. ANESTHESIA/SEDATION: Fentanyl 25 mcg IV; Versed 1.0 mg IV Moderate Sedation Time:  12 MINUTES The patient was continuously monitored during the procedure by the interventional radiology nurse under my direct supervision. CONTRAST:  10 cc-administered into the collecting system(s) FLUOROSCOPY TIME:  Fluoroscopy Time: 3 minutes 12 seconds (20 mGy). COMPLICATIONS: None immediate. PROCEDURE: 86year old female from a nursing home. No family member available. Several unsuccessful attempts were made to contact the power of attorney prior to the procedure. Therefore, emergent informed written consent was obtained from the patient after a discussion of the procedural risks, benefits and alternatives. All questions were addressed. Maximal Sterile Barrier  Technique was utilized including caps, mask, sterile gowns, sterile gloves, sterile drape, hand hygiene and skin antiseptic. A timeout was performed prior to the initiation of the procedure. Previous imaging reviewed. Patient positioned prone. Limited ultrasound performed. The mildly hydronephrotic left kidney was localized and marked for a lower pole access. Under sterile conditions and local anesthesia, ultrasound percutaneous needle access performed of the left kidney lower pole mildly dilated calyx. Needle position confirmed with ultrasound. There was return of slight blood tinged nonpurulent urine. Contrast injection confirms mild hydronephrosis and a nonobstructing left UPJ stone. Contrast immediately drained past the UPJ stone into the proximal ureter. 018 guidewire inserted followed by the Accustick dilator set. Amplatz guidewire advanced. Tract dilatation performed to insert a 10 French nephrostomy. Retention LOOP was difficult to form within the mildly dilated collecting system. Contrast injection confirms position within the collecting system. Diffuse filling defect throughout the collecting system AND proximal ureter related to hemorrhage from the somewhat difficult tube placement. Catheter secured at the skin site with a Prolene suture and connected to external gravity drainage bag. Sterile dressing applied. No immediate complication. Patient tolerated the procedure well. IMPRESSION: Successful ultrasound and fluoroscopic 10 French left nephrostomy insertion. 16 mm nonobstructing left UPJ calculus. Electronically Signed   By: MJerilynn Mages  Shick M.D.  On: 02/04/2020 08:35    (Echo, Carotid, EGD, Colonoscopy, ERCP)    Subjective: No complaints.  Discharge Exam: Vitals:   02/22/20 1953 02/23/20 0452  BP: 129/67 (!) 154/71  Pulse: 67 72  Resp: 19 18  Temp: 97.9 F (36.6 C) 97.9 F (36.6 C)  SpO2: 100% 100%   Vitals:   02/22/20 1049 02/22/20 1120 02/22/20 1953 02/23/20 0452  BP: (!) 154/73 (!)  142/76 129/67 (!) 154/71  Pulse: 77 75 67 72  Resp:  (!) 24 19 18   Temp:  (!) 97.4 F (36.3 C) 97.9 F (36.6 C) 97.9 F (36.6 C)  TempSrc:  Oral    SpO2:   100% 100%  Weight:    66.6 kg  Height:        General: Pt is alert, awake, not in acute distress Cardiovascular: RRR, S1/S2 +, no rubs, no gallops Respiratory: CTA bilaterally, no wheezing, no rhonchi Abdominal: Soft, NT, ND, bowel sounds + Extremities: no edema, no cyanosis    The results of significant diagnostics from this hospitalization (including imaging, microbiology, ancillary and laboratory) are listed below for reference.     Microbiology: Recent Results (from the past 240 hour(s))  Resp Panel by RT-PCR (Flu A&B, Covid) Nasopharyngeal Swab     Status: None   Collection Time: 02/14/20  4:22 AM   Specimen: Nasopharyngeal Swab; Nasopharyngeal(NP) swabs in vial transport medium  Result Value Ref Range Status   SARS Coronavirus 2 by RT PCR NEGATIVE NEGATIVE Final    Comment: (NOTE) SARS-CoV-2 target nucleic acids are NOT DETECTED.  The SARS-CoV-2 RNA is generally detectable in upper respiratory specimens during the acute phase of infection. The lowest concentration of SARS-CoV-2 viral copies this assay can detect is 138 copies/mL. A negative result does not preclude SARS-Cov-2 infection and should not be used as the sole basis for treatment or other patient management decisions. A negative result may occur with  improper specimen collection/handling, submission of specimen other than nasopharyngeal swab, presence of viral mutation(s) within the areas targeted by this assay, and inadequate number of viral copies(<138 copies/mL). A negative result must be combined with clinical observations, patient history, and epidemiological information. The expected result is Negative.  Fact Sheet for Patients:  EntrepreneurPulse.com.au  Fact Sheet for Healthcare Providers:   IncredibleEmployment.be  This test is no t yet approved or cleared by the [place] FDA and  has been authorized for detection and/or diagnosis of SARS-CoV-2 by FDA under an Emergency Use Authorization (EUA). This EUA will remain  in effect (meaning this test can be used) for the duration of the COVID-19 declaration under Section 564(b)(1) of the Act, 21 U.S.C.section 360bbb-3(b)(1), unless the authorization is terminated  or revoked sooner.       Influenza A by PCR NEGATIVE NEGATIVE Final   Influenza B by PCR NEGATIVE NEGATIVE Final    Comment: (NOTE) The Xpert Xpress SARS-CoV-2/FLU/RSV plus assay is intended as an aid in the diagnosis of influenza from Nasopharyngeal swab specimens and should not be used as a sole basis for treatment. Nasal washings and aspirates are unacceptable for Xpert Xpress SARS-CoV-2/FLU/RSV testing.  Fact Sheet for Patients: EntrepreneurPulse.com.au  Fact Sheet for Healthcare Providers: IncredibleEmployment.be  This test is not yet approved or cleared by the [place] FDA and has been authorized for detection and/or diagnosis of SARS-CoV-2 by FDA under an Emergency Use Authorization (EUA). This EUA will remain in effect (meaning this test can be used) for the duration of the COVID-19 declaration under Section  564(b)(1) of the Act, 21 U.S.C. section 360bbb-3(b)(1), unless the authorization is terminated or revoked.  Performed at [facility], [place] [address] [place], [place] [address]   Culture, blood (routine x 2)     Status: None   Collection Time: 02/14/20 11:03 AM   Specimen: BLOOD RIGHT ARM  Result Value Ref Range Status   Specimen Description BLOOD RIGHT ARM  Final   Special Requests   Final    BOTTLES DRAWN AEROBIC AND ANAEROBIC Blood Culture adequate volume   Culture   Final    NO GROWTH 5 DAYS Performed at [facility], [address], [place], [place]  [address]    Report Status 02/19/2020 FINAL  Final  Culture, blood (routine x 2)     Status: Abnormal   Collection Time: 02/14/20  6:21 PM   Specimen: BLOOD  Result Value Ref Range Status   Specimen Description BLOOD BLOOD RIGHT FOREARM  Final   Special Requests   Final    BOTTLES DRAWN AEROBIC AND ANAEROBIC Blood Culture adequate volume   Culture  Setup Time   Final    GRAM NEGATIVE RODS ANAEROBIC BOTTLE ONLY Organism ID to follow CRITICAL RESULT CALLED TO, READ BACK BY AND VERIFIED WITH: Ezekiel Slocumb PharmD 10:25 02/16/20 ([email or web address]) Performed at [facility], [place] [address], [place], [place] [address]    Culture (A)  Final    ESCHERICHIA COLI Confirmed Extended Spectrum Beta-Lactamase Producer (ESBL).  In bloodstream infections from ESBL organisms, carbapenems are preferred over piperacillin/tazobactam. They are shown to have a lower risk of mortality.    Report Status 02/18/2020 FINAL  Final   Organism ID, Bacteria ESCHERICHIA COLI  Final      Susceptibility   Escherichia coli - MIC*    AMPICILLIN >=32 RESISTANT Resistant     CEFAZOLIN >=64 RESISTANT Resistant     CEFEPIME >=32 RESISTANT Resistant     CEFTAZIDIME RESISTANT Resistant     CEFTRIAXONE >=64 RESISTANT Resistant     CIPROFLOXACIN >=4 RESISTANT Resistant     GENTAMICIN >=16 RESISTANT Resistant     IMIPENEM <=0.25 SENSITIVE Sensitive     TRIMETH/SULFA <=20 SENSITIVE Sensitive     AMPICILLIN/SULBACTAM >=32 RESISTANT Resistant     PIP/TAZO 8 SENSITIVE Sensitive     * ESCHERICHIA COLI  Blood Culture ID Panel (Reflexed)     Status: Abnormal   Collection Time: 02/14/20  6:21 PM  Result Value Ref Range Status   Enterococcus faecalis NOT DETECTED NOT DETECTED Final   Enterococcus Faecium NOT DETECTED NOT DETECTED Final   Listeria monocytogenes NOT DETECTED NOT DETECTED Final   Staphylococcus species NOT DETECTED NOT DETECTED Final   Staphylococcus aureus (BCID) NOT DETECTED NOT DETECTED Final   Staphylococcus  epidermidis NOT DETECTED NOT DETECTED Final   Staphylococcus lugdunensis NOT DETECTED NOT DETECTED Final   Streptococcus species NOT DETECTED NOT DETECTED Final   Streptococcus agalactiae NOT DETECTED NOT DETECTED Final   Streptococcus pneumoniae NOT DETECTED NOT DETECTED Final   Streptococcus pyogenes NOT DETECTED NOT DETECTED Final   A.calcoaceticus-baumannii NOT DETECTED NOT DETECTED Final   Bacteroides fragilis NOT DETECTED NOT DETECTED Final   Enterobacterales DETECTED (A) NOT DETECTED Final    Comment: Enterobacterales represent a large order of gram negative bacteria, not a single organism. CRITICAL RESULT CALLED TO, READ BACK BY AND VERIFIED WITH: Ezekiel Slocumb PharmD 10:25 02/16/20 ([email or web address])    Enterobacter cloacae complex NOT DETECTED NOT DETECTED Final   Escherichia coli DETECTED (A)  NOT DETECTED Final    Comment: CRITICAL RESULT CALLED TO, READ BACK BY AND VERIFIED WITH: Ezekiel Slocumb PharmD 10:25 02/16/20 ([email or web address])    Klebsiella aerogenes NOT DETECTED NOT DETECTED Final   Klebsiella oxytoca NOT DETECTED NOT DETECTED Final   Klebsiella pneumoniae NOT DETECTED NOT DETECTED Final   Proteus species NOT DETECTED NOT DETECTED Final   Salmonella species NOT DETECTED NOT DETECTED Final   Serratia marcescens NOT DETECTED NOT DETECTED Final   Haemophilus influenzae NOT DETECTED NOT DETECTED Final   Neisseria meningitidis NOT DETECTED NOT DETECTED Final   Pseudomonas aeruginosa NOT DETECTED NOT DETECTED Final   Stenotrophomonas maltophilia NOT DETECTED NOT DETECTED Final   Candida albicans NOT DETECTED NOT DETECTED Final   Candida auris NOT DETECTED NOT DETECTED Final   Candida glabrata NOT DETECTED NOT DETECTED Final   Candida krusei NOT DETECTED NOT DETECTED Final   Candida parapsilosis NOT DETECTED NOT DETECTED Final   Candida tropicalis NOT DETECTED NOT DETECTED Final   Cryptococcus neoformans/gattii NOT DETECTED NOT DETECTED Final   CTX-M ESBL DETECTED (A) NOT DETECTED Final     Comment: CRITICAL RESULT CALLED TO, READ BACK BY AND VERIFIED WITH: Ezekiel Slocumb PharmD 10:25 02/16/20 ([email or web address]) (NOTE) Extended spectrum beta-lactamase detected. Recommend a carbapenem as initial therapy.      Carbapenem resistance IMP NOT DETECTED NOT DETECTED Final   Carbapenem resistance KPC NOT DETECTED NOT DETECTED Final   Carbapenem resistance NDM NOT DETECTED NOT DETECTED Final   Carbapenem resist OXA 48 LIKE NOT DETECTED NOT DETECTED Final   Carbapenem resistance VIM NOT DETECTED NOT DETECTED Final    Comment: Performed at [facility], [place] [address], [place], [place] [address]  Culture, blood (routine x 2)     Status: None   Collection Time: 02/18/20  8:39 PM   Specimen: BLOOD  Result Value Ref Range Status   Specimen Description BLOOD RIGHT ARM  Final   Special Requests   Final    BOTTLES DRAWN AEROBIC AND ANAEROBIC Blood Culture adequate volume   Culture   Final    NO GROWTH 5 DAYS Performed at [facility], [address], [place], [place] [address]    Report Status 02/23/2020 FINAL  Final  Culture, blood (routine x 2)     Status: None   Collection Time: 02/18/20  8:58 PM   Specimen: BLOOD LEFT HAND  Result Value Ref Range Status   Specimen Description BLOOD LEFT HAND  Final   Special Requests   Final    BOTTLES DRAWN AEROBIC AND ANAEROBIC Blood Culture adequate volume   Culture   Final    NO GROWTH 5 DAYS Performed at [facility], [place] [address], [place], [place] [address]    Report Status 02/23/2020 FINAL  Final  Resp Panel by RT-PCR (Flu A&B, Covid) Nasopharyngeal Swab     Status: None   Collection Time: 02/22/20  2:53 PM   Specimen: Nasopharyngeal Swab; Nasopharyngeal(NP) swabs in vial transport medium  Result Value Ref Range Status   SARS Coronavirus 2 by RT PCR NEGATIVE NEGATIVE Final    Comment: (NOTE) SARS-CoV-2 target nucleic acids are NOT DETECTED.  The SARS-CoV-2 RNA is generally detectable in upper respiratory specimens during  the acute phase of infection. The lowest concentration of SARS-CoV-2 viral copies this assay can detect is 138 copies/mL. A negative result does not preclude SARS-Cov-2 infection and should not be used as the sole basis for treatment or other patient management decisions. A negative  result may occur with  improper specimen collection/handling, submission of specimen other than nasopharyngeal swab, presence of viral mutation(s) within the areas targeted by this assay, and inadequate number of viral copies(<138 copies/mL). A negative result must be combined with clinical observations, patient history, and epidemiological information. The expected result is Negative.  Fact Sheet for Patients:  EntrepreneurPulse.com.au  Fact Sheet for Healthcare Providers:  IncredibleEmployment.be  This test is no t yet approved or cleared by the [place] FDA and  has been authorized for detection and/or diagnosis of SARS-CoV-2 by FDA under an Emergency Use Authorization (EUA). This EUA will remain  in effect (meaning this test can be used) for the duration of the COVID-19 declaration under Section 564(b)(1) of the Act, 21 U.S.C.section 360bbb-3(b)(1), unless the authorization is terminated  or revoked sooner.       Influenza A by PCR NEGATIVE NEGATIVE Final   Influenza B by PCR NEGATIVE NEGATIVE Final    Comment: (NOTE) The Xpert Xpress SARS-CoV-2/FLU/RSV plus assay is intended as an aid in the diagnosis of influenza from Nasopharyngeal swab specimens and should not be used as a sole basis for treatment. Nasal washings and aspirates are unacceptable for Xpert Xpress SARS-CoV-2/FLU/RSV testing.  Fact Sheet for Patients: EntrepreneurPulse.com.au  Fact Sheet for Healthcare Providers: IncredibleEmployment.be  This test is not yet approved or cleared by the [place] FDA and has been authorized for detection and/or  diagnosis of SARS-CoV-2 by FDA under an Emergency Use Authorization (EUA). This EUA will remain in effect (meaning this test can be used) for the duration of the COVID-19 declaration under Section 564(b)(1) of the Act, 21 U.S.C. section 360bbb-3(b)(1), unless the authorization is terminated or revoked.  Performed at [facility], [place] [address] [place], [place] [address]      Labs: BNP (last 3 results) Recent Labs    02/14/20 0422  BNP 4,235.3*   Basic Metabolic Panel: Recent Labs  Lab 02/17/20 0143 02/18/20 0232 02/19/20 0430 02/21/20 0932  NA 137 138 140 147*  K 4.9 3.9 4.5 4.2  CL 98 99 100 110  CO2 22 28 27 27   GLUCOSE 95 106* 104* 120*  BUN 30* 38* 40* 39*  CREATININE 1.32* 1.55* 1.69* 1.39*  CALCIUM 8.9 8.7* 8.7* 8.5*   Liver Function Tests: No results for input(s): AST, ALT, ALKPHOS, BILITOT, PROT, ALBUMIN in the last 168 hours. No results for input(s): LIPASE, AMYLASE in the last 168 hours. No results for input(s): AMMONIA in the last 168 hours. CBC: Recent Labs  Lab 02/18/20 2056 02/21/20 0932  WBC 17.0* 11.2*  NEUTROABS 14.6*  --   HGB 11.0* 10.9*  HCT 35.9* 37.4  MCV 94.2 98.9  PLT 337 360   Cardiac Enzymes: No results for input(s): CKTOTAL, CKMB, CKMBINDEX, TROPONINI in the last 168 hours. BNP: Invalid input(s): POCBNP CBG: No results for input(s): GLUCAP in the last 168 hours. D-Dimer No results for input(s): DDIMER in the last 72 hours. Hgb A1c No results for input(s): HGBA1C in the last 72 hours. Lipid Profile No results for input(s): CHOL, HDL, LDLCALC, TRIG, CHOLHDL, LDLDIRECT in the last 72 hours. Thyroid function studies No results for input(s): TSH, T4TOTAL, T3FREE, THYROIDAB in the last 72 hours.  Invalid input(s): FREET3 Anemia work up No results for input(s): VITAMINB12, FOLATE, FERRITIN, TIBC, IRON, RETICCTPCT in the last 72 hours. Urinalysis    Component Value Date/Time   COLORURINE YELLOW 02/03/2020 2038    APPEARANCEUR CLOUDY (A) 02/03/2020 2038   LABSPEC 1.017  02/03/2020 2038   PHURINE 5.0 02/03/2020 2038   GLUCOSEU NEGATIVE 02/03/2020 2038   HGBUR MODERATE (A) 02/03/2020 2038   BILIRUBINUR NEGATIVE 02/03/2020 2038   [place] 02/03/2020 2038   PROTEINUR 100 (A) 02/03/2020 2038   UROBILINOGEN 0.2 04/10/2008 1519   NITRITE POSITIVE (A) 02/03/2020 2038   LEUKOCYTESUR LARGE (A) 02/03/2020 2038   Sepsis Labs Invalid input(s): PROCALCITONIN,  WBC,  LACTICIDVEN Microbiology Recent Results (from the past 240 hour(s))  Resp Panel by RT-PCR (Flu A&B, Covid) Nasopharyngeal Swab     Status: None   Collection Time: 02/14/20  4:22 AM   Specimen: Nasopharyngeal Swab; Nasopharyngeal(NP) swabs in vial transport medium  Result Value Ref Range Status   SARS Coronavirus 2 by RT PCR NEGATIVE NEGATIVE Final    Comment: (NOTE) SARS-CoV-2 target nucleic acids are NOT DETECTED.  The SARS-CoV-2 RNA is generally detectable in upper respiratory specimens during the acute phase of infection. The lowest concentration of SARS-CoV-2 viral copies this assay can detect is 138 copies/mL. A negative result does not preclude SARS-Cov-2 infection and should not be used as the sole basis for treatment or other patient management decisions. A negative result may occur with  improper specimen collection/handling, submission of specimen other than nasopharyngeal swab, presence of viral mutation(s) within the areas targeted by this assay, and inadequate number of viral copies(<138 copies/mL). A negative result must be combined with clinical observations, patient history, and epidemiological information. The expected result is Negative.  Fact Sheet for Patients:  EntrepreneurPulse.com.au  Fact Sheet for Healthcare Providers:  IncredibleEmployment.be  This test is no t yet approved or cleared by the [place] FDA and  has been authorized for detection and/or diagnosis  of SARS-CoV-2 by FDA under an Emergency Use Authorization (EUA). This EUA will remain  in effect (meaning this test can be used) for the duration of the COVID-19 declaration under Section 564(b)(1) of the Act, 21 U.S.C.section 360bbb-3(b)(1), unless the authorization is terminated  or revoked sooner.       Influenza A by PCR NEGATIVE NEGATIVE Final   Influenza B by PCR NEGATIVE NEGATIVE Final    Comment: (NOTE) The Xpert Xpress SARS-CoV-2/FLU/RSV plus assay is intended as an aid in the diagnosis of influenza from Nasopharyngeal swab specimens and should not be used as a sole basis for treatment. Nasal washings and aspirates are unacceptable for Xpert Xpress SARS-CoV-2/FLU/RSV testing.  Fact Sheet for Patients: EntrepreneurPulse.com.au  Fact Sheet for Healthcare Providers: IncredibleEmployment.be  This test is not yet approved or cleared by the [place] FDA and has been authorized for detection and/or diagnosis of SARS-CoV-2 by FDA under an Emergency Use Authorization (EUA). This EUA will remain in effect (meaning this test can be used) for the duration of the COVID-19 declaration under Section 564(b)(1) of the Act, 21 U.S.C. section 360bbb-3(b)(1), unless the authorization is terminated or revoked.  Performed at [facility], [place] [address] [place], [place] [address]   Culture, blood (routine x 2)     Status: None   Collection Time: 02/14/20 11:03 AM   Specimen: BLOOD RIGHT ARM  Result Value Ref Range Status   Specimen Description BLOOD RIGHT ARM  Final   Special Requests   Final    BOTTLES DRAWN AEROBIC AND ANAEROBIC Blood Culture adequate volume   Culture   Final    NO GROWTH 5 DAYS Performed at [facility], [address], [place], [place] [address]    Report Status 02/19/2020 FINAL  Final  Culture, blood (routine x 2)     Status: Abnormal   Collection Time: 02/14/20  6:21 PM   Specimen: BLOOD  Result Value Ref  Range Status   Specimen Description BLOOD BLOOD RIGHT FOREARM  Final   Special Requests   Final    BOTTLES DRAWN AEROBIC AND ANAEROBIC Blood Culture adequate volume   Culture  Setup Time   Final    GRAM NEGATIVE RODS ANAEROBIC BOTTLE ONLY Organism ID to follow CRITICAL RESULT CALLED TO, READ BACK BY AND VERIFIED WITH: Ezekiel Slocumb PharmD 10:25 02/16/20 ([email or web address]) Performed at [facility], [address], [place], [place] [address]    Culture (A)  Final    ESCHERICHIA COLI Confirmed Extended Spectrum Beta-Lactamase Producer (ESBL).  In bloodstream infections from ESBL organisms, carbapenems are preferred over piperacillin/tazobactam. They are shown to have a lower risk of mortality.    Report Status 02/18/2020 FINAL  Final   Organism ID, Bacteria ESCHERICHIA COLI  Final      Susceptibility   Escherichia coli - MIC*    AMPICILLIN >=32 RESISTANT Resistant     CEFAZOLIN >=64 RESISTANT Resistant     CEFEPIME >=32 RESISTANT Resistant     CEFTAZIDIME RESISTANT Resistant     CEFTRIAXONE >=64 RESISTANT Resistant     CIPROFLOXACIN >=4 RESISTANT Resistant     GENTAMICIN >=16 RESISTANT Resistant     IMIPENEM <=0.25 SENSITIVE Sensitive     TRIMETH/SULFA <=20 SENSITIVE Sensitive     AMPICILLIN/SULBACTAM >=32 RESISTANT Resistant     PIP/TAZO 8 SENSITIVE Sensitive     * ESCHERICHIA COLI  Blood Culture ID Panel (Reflexed)     Status: Abnormal   Collection Time: 02/14/20  6:21 PM  Result Value Ref Range Status   Enterococcus faecalis NOT DETECTED NOT DETECTED Final   Enterococcus Faecium NOT DETECTED NOT DETECTED Final   Listeria monocytogenes NOT DETECTED NOT DETECTED Final   Staphylococcus species NOT DETECTED NOT DETECTED Final   Staphylococcus aureus (BCID) NOT DETECTED NOT DETECTED Final   Staphylococcus epidermidis NOT DETECTED NOT DETECTED Final   Staphylococcus lugdunensis NOT DETECTED NOT DETECTED Final   Streptococcus species NOT DETECTED NOT DETECTED Final   Streptococcus  agalactiae NOT DETECTED NOT DETECTED Final   Streptococcus pneumoniae NOT DETECTED NOT DETECTED Final   Streptococcus pyogenes NOT DETECTED NOT DETECTED Final   A.calcoaceticus-baumannii NOT DETECTED NOT DETECTED Final   Bacteroides fragilis NOT DETECTED NOT DETECTED Final   Enterobacterales DETECTED (A) NOT DETECTED Final    Comment: Enterobacterales represent a large order of gram negative bacteria, not a single organism. CRITICAL RESULT CALLED TO, READ BACK BY AND VERIFIED WITH: Ezekiel Slocumb PharmD 10:25 02/16/20 ([email or web address])    Enterobacter cloacae complex NOT DETECTED NOT DETECTED Final   Escherichia coli DETECTED (A) NOT DETECTED Final    Comment: CRITICAL RESULT CALLED TO, READ BACK BY AND VERIFIED WITH: Ezekiel Slocumb PharmD 10:25 02/16/20 ([email or web address])    Klebsiella aerogenes NOT DETECTED NOT DETECTED Final   Klebsiella oxytoca NOT DETECTED NOT DETECTED Final   Klebsiella pneumoniae NOT DETECTED NOT DETECTED Final   Proteus species NOT DETECTED NOT DETECTED Final   Salmonella species NOT DETECTED NOT DETECTED Final   Serratia marcescens NOT DETECTED NOT DETECTED Final   Haemophilus influenzae NOT DETECTED NOT DETECTED Final   Neisseria meningitidis NOT DETECTED NOT DETECTED Final   Pseudomonas aeruginosa NOT DETECTED NOT DETECTED Final   Stenotrophomonas maltophilia NOT DETECTED NOT DETECTED Final   Candida albicans NOT DETECTED NOT DETECTED Final  Candida auris NOT DETECTED NOT DETECTED Final   Candida glabrata NOT DETECTED NOT DETECTED Final   Candida krusei NOT DETECTED NOT DETECTED Final   Candida parapsilosis NOT DETECTED NOT DETECTED Final   Candida tropicalis NOT DETECTED NOT DETECTED Final   Cryptococcus neoformans/gattii NOT DETECTED NOT DETECTED Final   CTX-M ESBL DETECTED (A) NOT DETECTED Final    Comment: CRITICAL RESULT CALLED TO, READ BACK BY AND VERIFIED WITH: Ezekiel Slocumb PharmD 10:25 02/16/20 ([email or web address]) (NOTE) Extended spectrum beta-lactamase detected. Recommend a  carbapenem as initial therapy.      Carbapenem resistance IMP NOT DETECTED NOT DETECTED Final   Carbapenem resistance KPC NOT DETECTED NOT DETECTED Final   Carbapenem resistance NDM NOT DETECTED NOT DETECTED Final   Carbapenem resist OXA 48 LIKE NOT DETECTED NOT DETECTED Final   Carbapenem resistance VIM NOT DETECTED NOT DETECTED Final    Comment: Performed at [facility], [place] [address] [place], [place] [address]  Culture, blood (routine x 2)     Status: None   Collection Time: 02/18/20  8:39 PM   Specimen: BLOOD  Result Value Ref Range Status   Specimen Description BLOOD RIGHT ARM  Final   Special Requests   Final    BOTTLES DRAWN AEROBIC AND ANAEROBIC Blood Culture adequate volume   Culture   Final    NO GROWTH 5 DAYS Performed at [facility], [address], [place], [place] [address]    Report Status 02/23/2020 FINAL  Final  Culture, blood (routine x 2)     Status: None   Collection Time: 02/18/20  8:58 PM   Specimen: BLOOD LEFT HAND  Result Value Ref Range Status   Specimen Description BLOOD LEFT HAND  Final   Special Requests   Final    BOTTLES DRAWN AEROBIC AND ANAEROBIC Blood Culture adequate volume   Culture   Final    NO GROWTH 5 DAYS Performed at [facility], [place] [address], [place], [place] [address]    Report Status 02/23/2020 FINAL  Final  Resp Panel by RT-PCR (Flu A&B, Covid) Nasopharyngeal Swab     Status: None   Collection Time: 02/22/20  2:53 PM   Specimen: Nasopharyngeal Swab; Nasopharyngeal(NP) swabs in vial transport medium  Result Value Ref Range Status   SARS Coronavirus 2 by RT PCR NEGATIVE NEGATIVE Final    Comment: (NOTE) SARS-CoV-2 target nucleic acids are NOT DETECTED.  The SARS-CoV-2 RNA is generally detectable in upper respiratory specimens during the acute phase of infection. The lowest concentration of SARS-CoV-2 viral copies this assay can detect is 138 copies/mL. A negative result does not preclude  SARS-Cov-2 infection and should not be used as the sole basis for treatment or other patient management decisions. A negative result may occur with  improper specimen collection/handling, submission of specimen other than nasopharyngeal swab, presence of viral mutation(s) within the areas targeted by this assay, and inadequate number of viral copies(<138 copies/mL). A negative result must be combined with clinical observations, patient history, and epidemiological information. The expected result is Negative.  Fact Sheet for Patients:  EntrepreneurPulse.com.au  Fact Sheet for Healthcare Providers:  IncredibleEmployment.be  This test is no t yet approved or cleared by the [place] FDA and  has been authorized for detection and/or diagnosis of SARS-CoV-2 by FDA under an Emergency Use Authorization (EUA). This EUA will remain  in effect (meaning this test can be used) for the duration of the COVID-19 declaration under Section 564(b)(1) of  the Act, 21 U.S.C.section 360bbb-3(b)(1), unless the authorization is terminated  or revoked sooner.       Influenza A by PCR NEGATIVE NEGATIVE Final   Influenza B by PCR NEGATIVE NEGATIVE Final    Comment: (NOTE) The Xpert Xpress SARS-CoV-2/FLU/RSV plus assay is intended as an aid in the diagnosis of influenza from Nasopharyngeal swab specimens and should not be used as a sole basis for treatment. Nasal washings and aspirates are unacceptable for Xpert Xpress SARS-CoV-2/FLU/RSV testing.  Fact Sheet for Patients: EntrepreneurPulse.com.au  Fact Sheet for Healthcare Providers: IncredibleEmployment.be  This test is not yet approved or cleared by the [place] FDA and has been authorized for detection and/or diagnosis of SARS-CoV-2 by FDA under an Emergency Use Authorization (EUA). This EUA will remain in effect (meaning this test can be used) for the duration of  the COVID-19 declaration under Section 564(b)(1) of the Act, 21 U.S.C. section 360bbb-3(b)(1), unless the authorization is terminated or revoked.  Performed at [facility], [place] [address], [place], [place] [address]      Time coordinating discharge: Over 40 minutes  SIGNED:   Charlynne Cousins, MD  [facility] 02/23/2020, 8:36 AM Pager   If 7PM-7AM, please contact night-coverage [email or web address] Password [email or web address]

## 2020-02-23 NOTE — Plan of Care (Signed)

## 2020-02-23 NOTE — Plan of Care (Signed)
  Problem: Education: Goal: Ability to demonstrate management of disease process will improve Outcome: Progressing Goal: Ability to verbalize understanding of medication therapies will improve Outcome: Progressing   

## 2020-02-23 NOTE — Progress Notes (Signed)
Patient alert oriented x 2-3 at baseline denies pain VSS , pt. Is  weak , using her abdomen muscle to breath. MD notified , MD okay to send pt back to  SNF. Report given to Ena at [facility] all questions answered. IV and Tele removed pt. D/c via ambulance to SNF

## 2020-02-24 IMAGING — CT CT HEAD W/O CM
3 series · 16 of 47 positions shown, 19 images · non-contrast
Comparison: 07/08/2017

CLINICAL DATA: Found on floor

EXAM:
CT HEAD WITHOUT CONTRAST
TECHNIQUE: Contiguous axial images were obtained from the base of the skull
through the vertex without intravenous contrast.

[Series 3: head 5.0 h30s · axial · 0.43mm/px · z∈[-119,+21]mm · 10 of 34 slices shown, 13 images]
[im 3/34  brain]
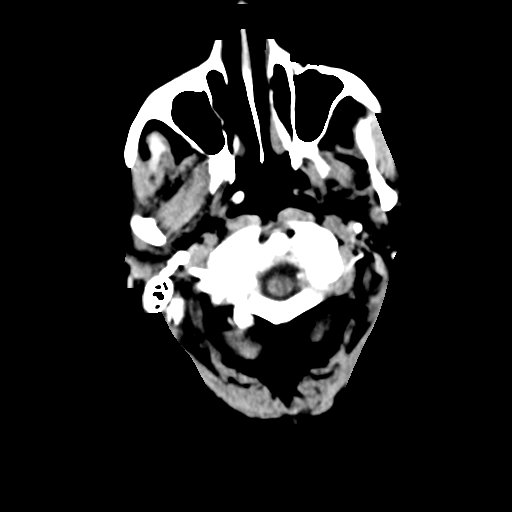
[im 3/34  bone]
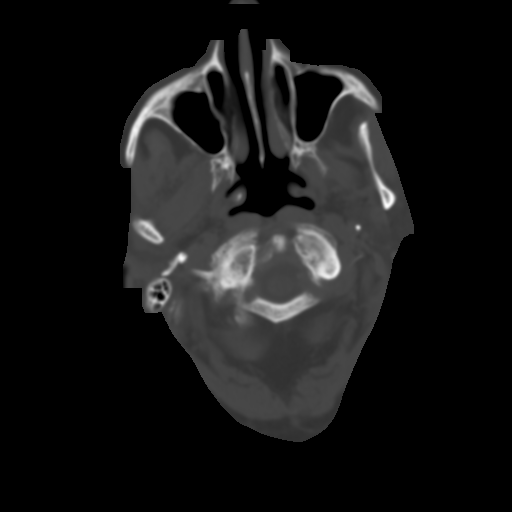
[im 6/34  brain]
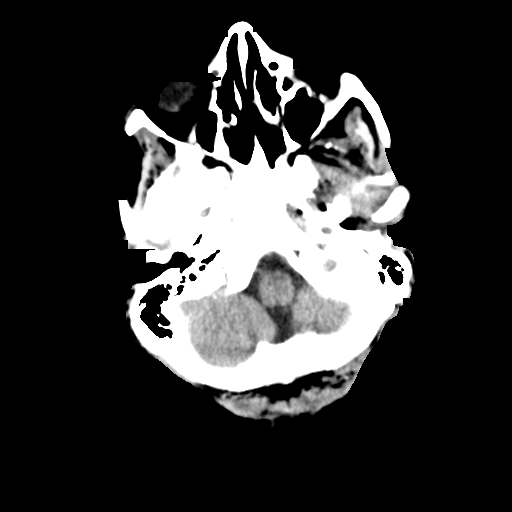
[im 10/34  brain]
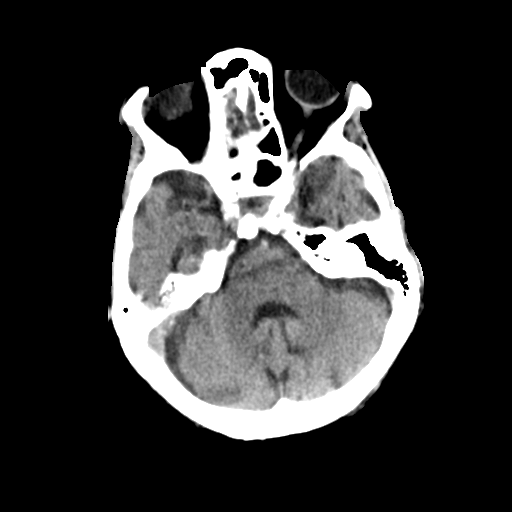
[im 12/34  brain]
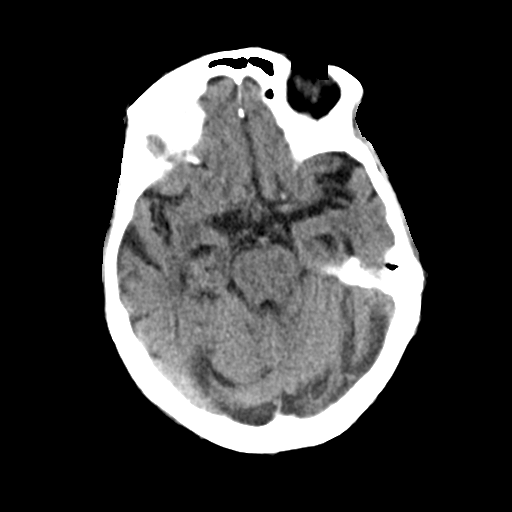
[im 15/34  brain]
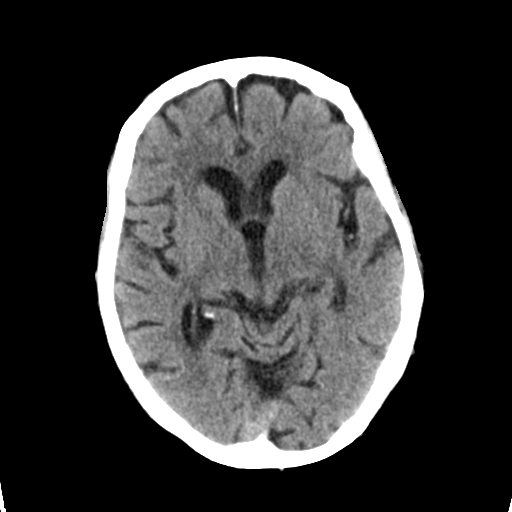
[im 15/34  bone]
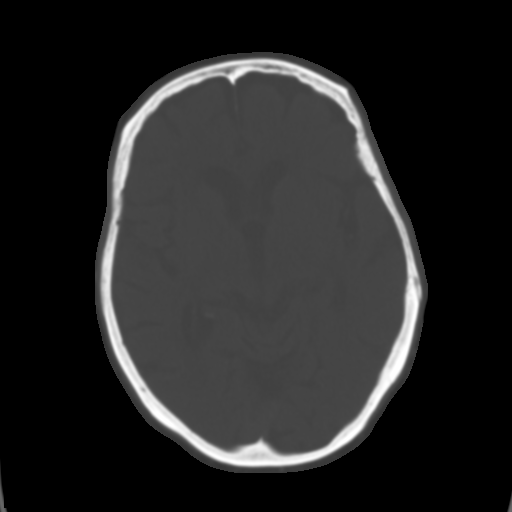
[im 19/34  brain]
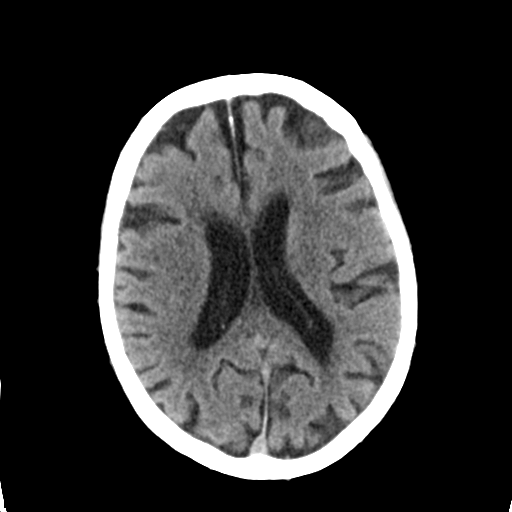
[im 22/34  brain]
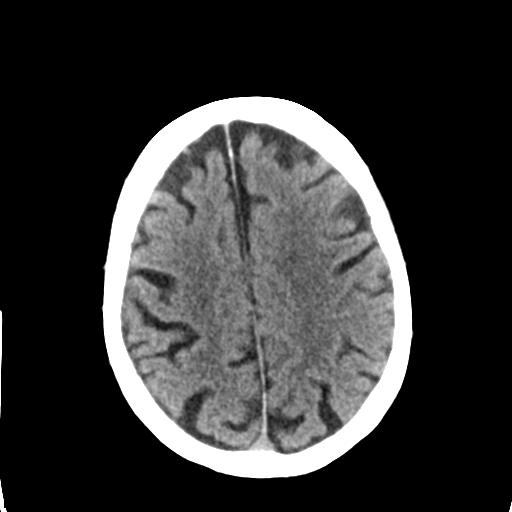
[im 26/34  brain]
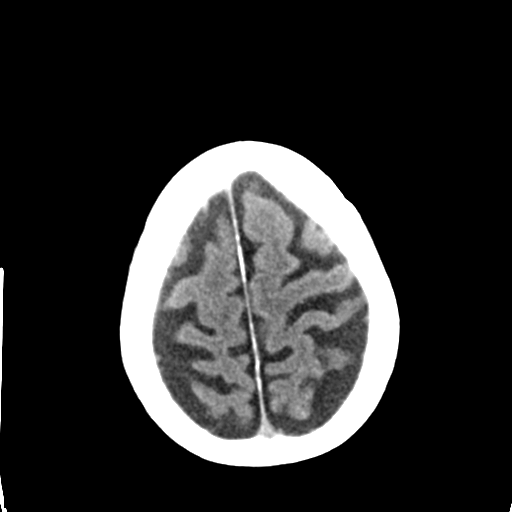
[im 28/34  brain]
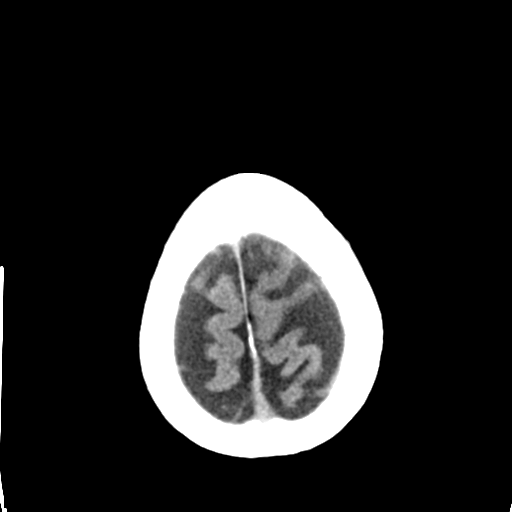
[im 28/34  bone]
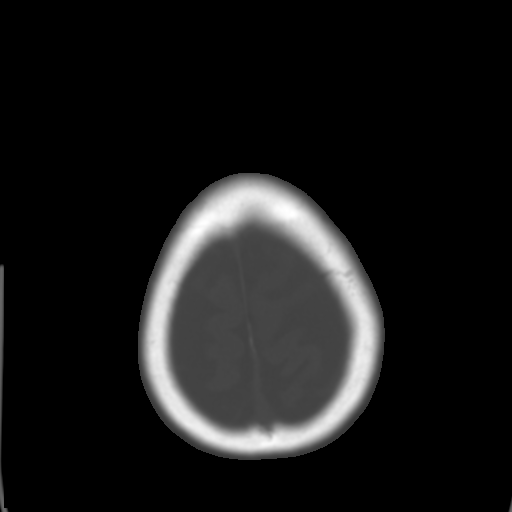
[im 31/34  brain]
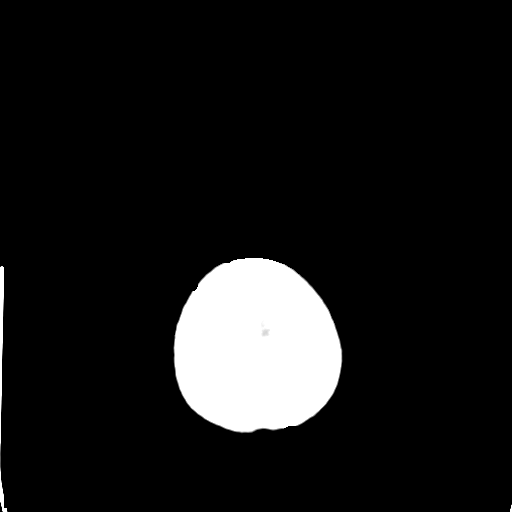

[Series 5: head 3.0 mpr cor · coronal · 0.33mm/px · 3 of 67 slices shown]
[im 23/67  brain]
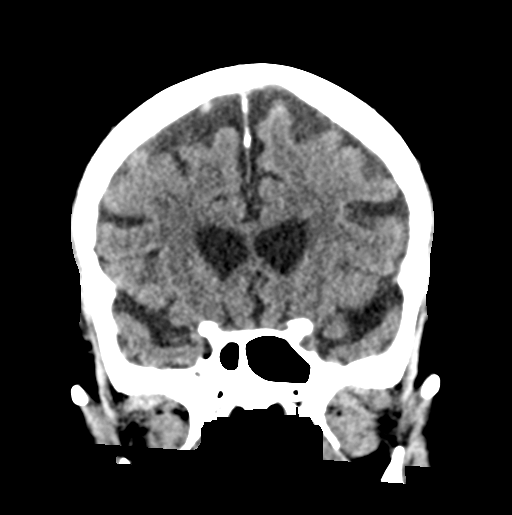
[im 30/67  brain]
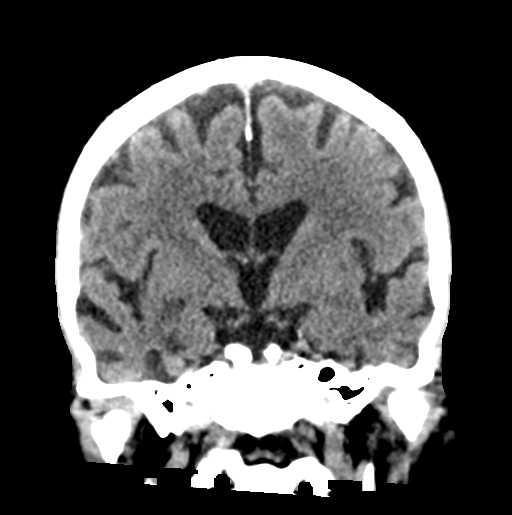
[im 37/67  brain]
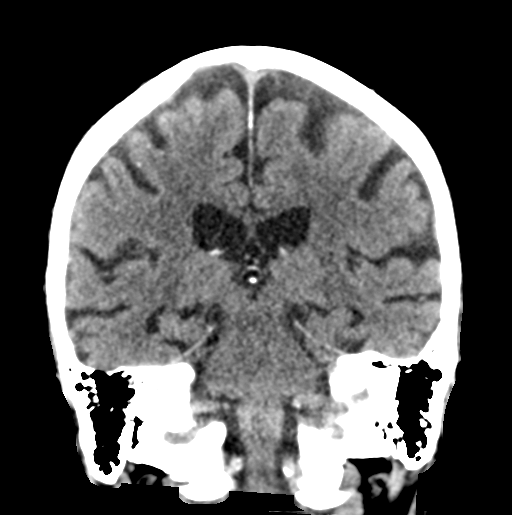

[Series 6: head 3.0 mpr sag · sagittal · 0.33mm/px · 3 of 57 slices shown]
[im 19/57  brain]
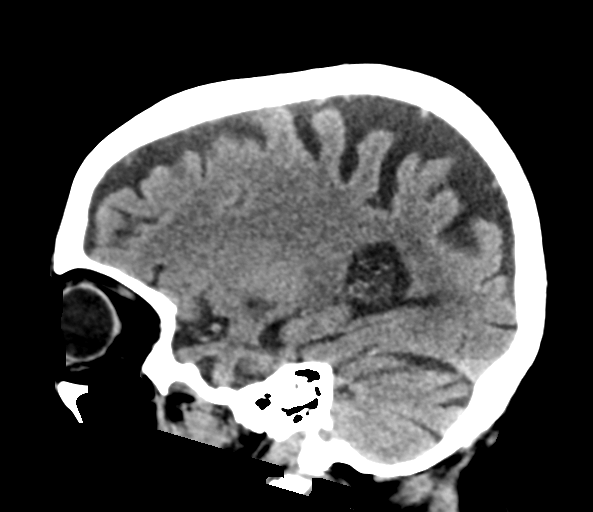
[im 29/57  brain]
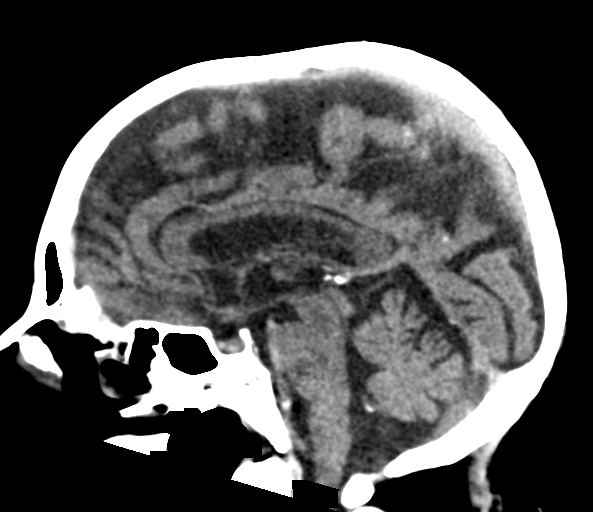
[im 38/57  brain]
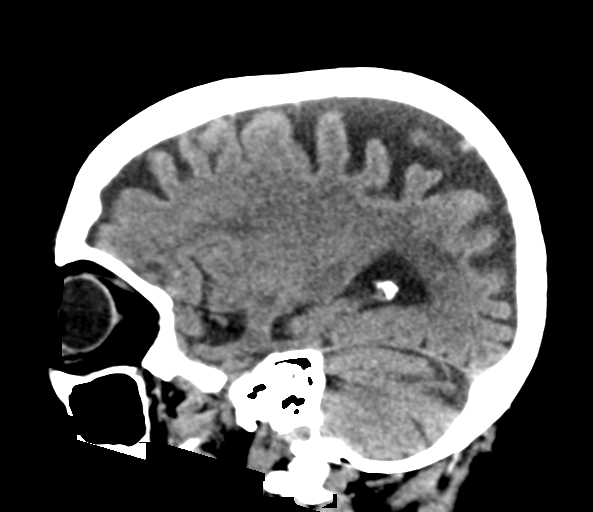

[16 of 47 positions shown; findings below may reference images not displayed]

FINDINGS: Brain: No acute territorial infarction, hemorrhage or intracranial
mass. Marked atrophy. Mild small vessel ischemic changes of the
white matter.

Vascular: No hyperdense vessels. Vertebral and carotid vascular
calcification

Skull: Normal. Negative for fracture or focal lesion.

Sinuses/Orbits: Mucosal thickening in the maxillary and ethmoid
sinuses.

Other: None
IMPRESSION: 1. No CT evidence for acute intracranial abnormality.
2. Atrophy and small vessel ischemic changes of the white matter

## 2020-02-28 DIAGNOSIS — I10 Essential (primary) hypertension: Secondary | ICD-10-CM | POA: Diagnosis not present

## 2020-02-28 DIAGNOSIS — D638 Anemia in other chronic diseases classified elsewhere: Secondary | ICD-10-CM | POA: Diagnosis not present

## 2020-02-28 DIAGNOSIS — G301 Alzheimer's disease with late onset: Secondary | ICD-10-CM | POA: Diagnosis not present

## 2020-02-28 DIAGNOSIS — R5381 Other malaise: Secondary | ICD-10-CM | POA: Diagnosis not present

## 2020-03-25 IMAGING — DX DG CHEST 1V PORT
1 series · 1 of 1 positions shown · non-contrast
Comparison: Chest radiograph 04/10/2008

CLINICAL DATA: Weakness.

EXAM:
PORTABLE CHEST 1 VIEW

[chest ap]
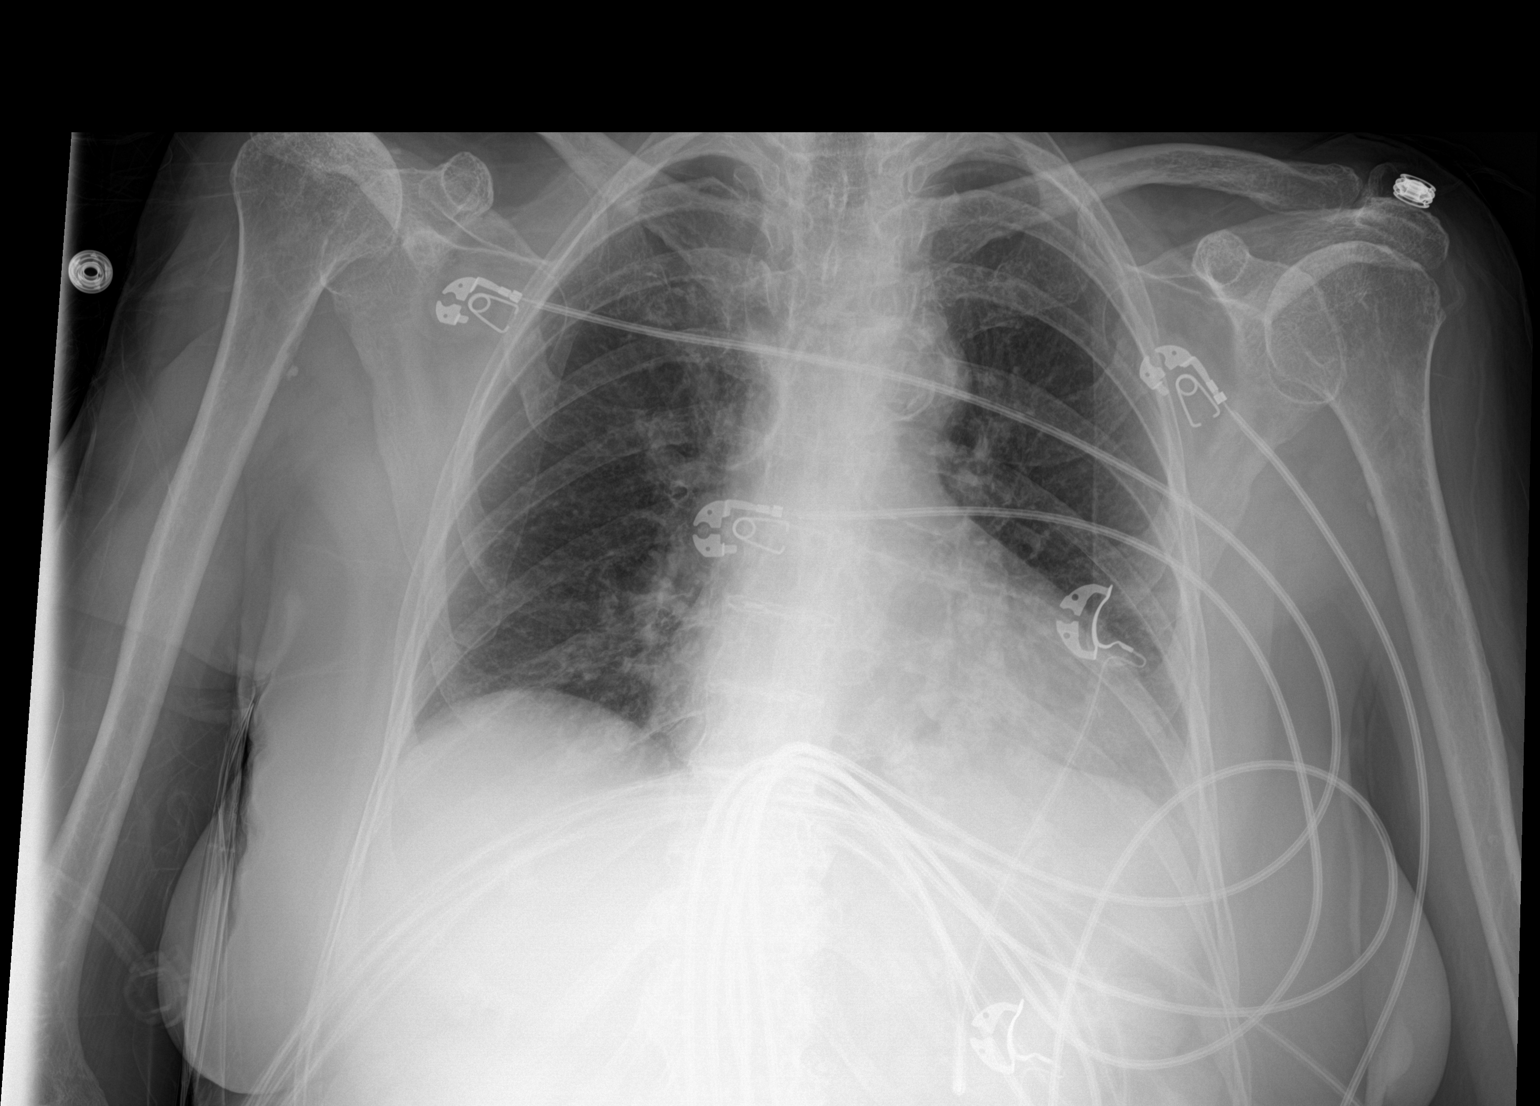

[1 of 1 positions shown; findings below may reference images not displayed]

FINDINGS: Monitoring leads overlie the patient. Stable cardiomegaly. No
consolidative pulmonary opacities. No pleural effusion or
pneumothorax. Thoracic spine degenerative changes.
IMPRESSION: No acute cardiopulmonary process.  Cardiomegaly.

## 2020-03-27 DEATH — deceased

## 2022-04-14 IMAGING — DX DG CHEST 1V PORT
1 series · 1 of 1 positions shown · non-contrast
Comparison: January 14, 2018

CLINICAL DATA: Questionable sepsis.

EXAM:
PORTABLE CHEST 1 VIEW

[chest ap]
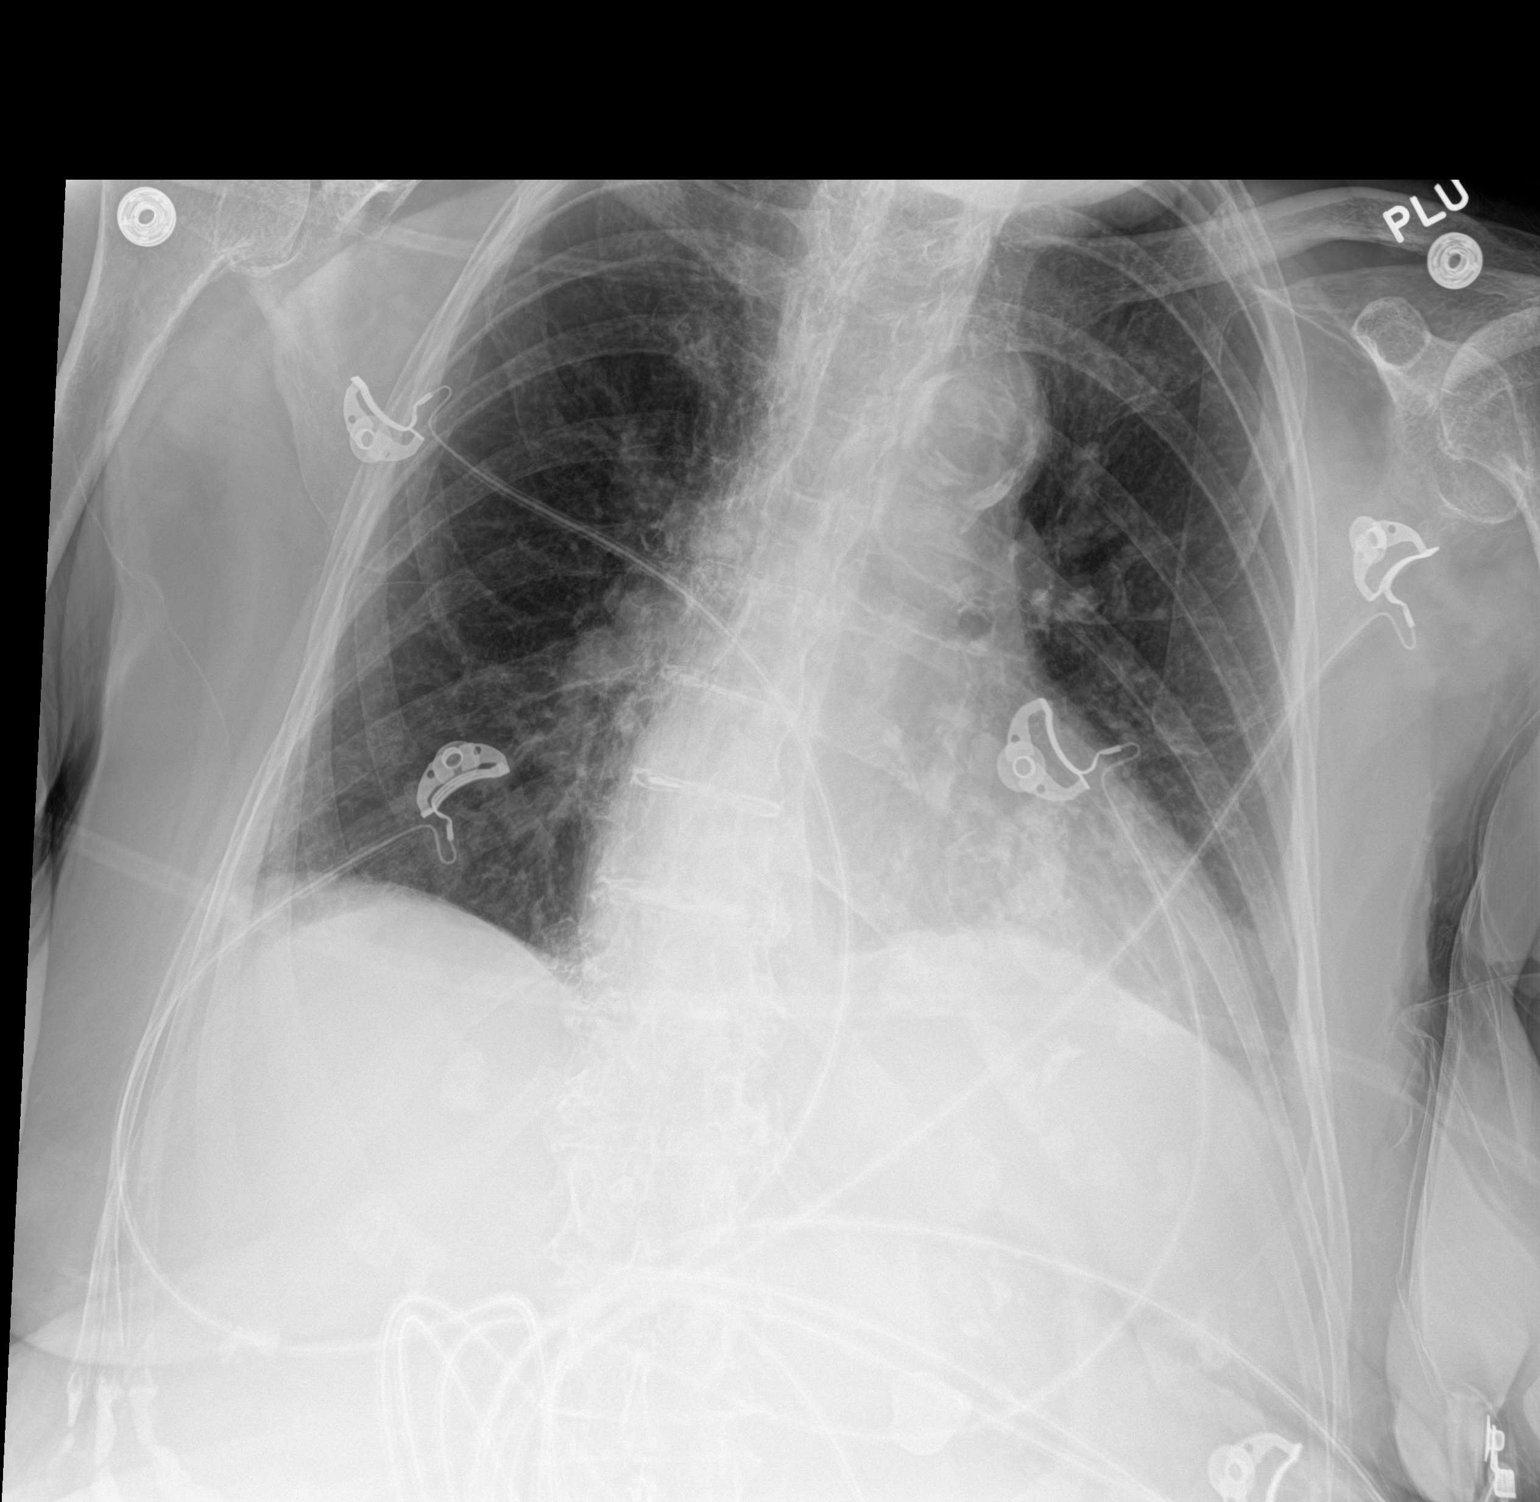

[1 of 1 positions shown; findings below may reference images not displayed]

FINDINGS: The lungs are hyperinflated. There is no evidence of acute
infiltrate, pleural effusion or pneumothorax. The cardiac silhouette
is mildly enlarged and unchanged in size. There is marked severity
calcification of the aortic arch. Degenerative changes seen
throughout the thoracic spine.
IMPRESSION: Stable exam without acute or active cardiopulmonary disease.
